# Patient Record
Sex: Male | Born: 1950 | Race: White | Hispanic: No | Marital: Married | Smoking: Never smoker
Health system: Southern US, Community
[De-identification: ages and names within clinical notes are randomized; demographics above are authoritative.]

## PROBLEM LIST (undated history)

## (undated) DIAGNOSIS — Z9621 Cochlear implant status: Secondary | ICD-10-CM

## (undated) DIAGNOSIS — R42 Dizziness and giddiness: Secondary | ICD-10-CM

## (undated) DIAGNOSIS — I4510 Unspecified right bundle-branch block: Secondary | ICD-10-CM

## (undated) DIAGNOSIS — K8020 Calculus of gallbladder without cholecystitis without obstruction: Secondary | ICD-10-CM

## (undated) DIAGNOSIS — K449 Diaphragmatic hernia without obstruction or gangrene: Secondary | ICD-10-CM

## (undated) DIAGNOSIS — M5136 Other intervertebral disc degeneration, lumbar region: Secondary | ICD-10-CM

## (undated) DIAGNOSIS — R7303 Prediabetes: Secondary | ICD-10-CM

## (undated) DIAGNOSIS — I2510 Atherosclerotic heart disease of native coronary artery without angina pectoris: Secondary | ICD-10-CM

## (undated) DIAGNOSIS — M51369 Other intervertebral disc degeneration, lumbar region without mention of lumbar back pain or lower extremity pain: Secondary | ICD-10-CM

## (undated) DIAGNOSIS — K222 Esophageal obstruction: Secondary | ICD-10-CM

## (undated) DIAGNOSIS — K3184 Gastroparesis: Secondary | ICD-10-CM

## (undated) DIAGNOSIS — Z860100 Personal history of colon polyps, unspecified: Secondary | ICD-10-CM

## (undated) DIAGNOSIS — E559 Vitamin D deficiency, unspecified: Secondary | ICD-10-CM

## (undated) DIAGNOSIS — K5790 Diverticulosis of intestine, part unspecified, without perforation or abscess without bleeding: Secondary | ICD-10-CM

## (undated) DIAGNOSIS — K589 Irritable bowel syndrome without diarrhea: Secondary | ICD-10-CM

## (undated) DIAGNOSIS — R011 Cardiac murmur, unspecified: Secondary | ICD-10-CM

## (undated) DIAGNOSIS — E785 Hyperlipidemia, unspecified: Secondary | ICD-10-CM

## (undated) DIAGNOSIS — I10 Essential (primary) hypertension: Secondary | ICD-10-CM

## (undated) DIAGNOSIS — I700 Atherosclerosis of aorta: Secondary | ICD-10-CM

## (undated) DIAGNOSIS — K219 Gastro-esophageal reflux disease without esophagitis: Secondary | ICD-10-CM

## (undated) DIAGNOSIS — K4020 Bilateral inguinal hernia, without obstruction or gangrene, not specified as recurrent: Secondary | ICD-10-CM

## (undated) DIAGNOSIS — D689 Coagulation defect, unspecified: Secondary | ICD-10-CM

## (undated) DIAGNOSIS — N529 Male erectile dysfunction, unspecified: Secondary | ICD-10-CM

## (undated) DIAGNOSIS — M1990 Unspecified osteoarthritis, unspecified site: Secondary | ICD-10-CM

## (undated) HISTORY — DX: Hyperlipidemia, unspecified: E78.5

## (undated) HISTORY — DX: Unspecified osteoarthritis, unspecified site: M19.90

## (undated) HISTORY — PX: UPPER GI ENDOSCOPY: SHX6162

## (undated) HISTORY — DX: Male erectile dysfunction, unspecified: N52.9

## (undated) HISTORY — DX: Other intervertebral disc degeneration, lumbar region without mention of lumbar back pain or lower extremity pain: M51.369

## (undated) HISTORY — DX: Irritable bowel syndrome, unspecified: K58.9

## (undated) HISTORY — DX: Personal history of colonic polyps: Z86.010

## (undated) HISTORY — DX: Diaphragmatic hernia without obstruction or gangrene: K44.9

## (undated) HISTORY — PX: STAPEDECTOMY: SHX2435

## (undated) HISTORY — DX: Other intervertebral disc degeneration, lumbar region: M51.36

## (undated) HISTORY — PX: WISDOM TOOTH EXTRACTION: SHX21

## (undated) HISTORY — DX: Personal history of colon polyps, unspecified: Z86.0100

## (undated) HISTORY — DX: Unspecified right bundle-branch block: I45.10

## (undated) HISTORY — DX: Dizziness and giddiness: R42

## (undated) HISTORY — DX: Essential (primary) hypertension: I10

## (undated) HISTORY — DX: Atherosclerotic heart disease of native coronary artery without angina pectoris: I25.10

## (undated) HISTORY — DX: Atherosclerosis of aorta: I70.0

## (undated) HISTORY — DX: Gastroparesis: K31.84

## (undated) HISTORY — DX: Gastro-esophageal reflux disease without esophagitis: K21.9

## (undated) HISTORY — DX: Esophageal obstruction: K22.2

## (undated) HISTORY — PX: ARTHROSCOPIC REPAIR ACL: SUR80

## (undated) HISTORY — PX: COLONOSCOPY: SHX174

## (undated) HISTORY — PX: COCHLEAR IMPLANT: SHX184

## (undated) HISTORY — PX: HERNIA REPAIR: SHX51

## (undated) HISTORY — DX: Coagulation defect, unspecified: D68.9

## (undated) HISTORY — DX: Vitamin D deficiency, unspecified: E55.9

## (undated) HISTORY — DX: Diverticulosis of intestine, part unspecified, without perforation or abscess without bleeding: K57.90

## (undated) HISTORY — DX: Bilateral inguinal hernia, without obstruction or gangrene, not specified as recurrent: K40.20

## (undated) HISTORY — PX: SKIN LESION EXCISION: SHX2412

## (undated) HISTORY — DX: Cardiac murmur, unspecified: R01.1

## (undated) HISTORY — DX: Calculus of gallbladder without cholecystitis without obstruction: K80.20

---
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
## 1973-07-04 HISTORY — PX: CYST REMOVAL HAND: SHX6279

## 2003-07-05 HISTORY — PX: CARDIAC CATHETERIZATION: SHX172

## 2008-03-11 ENCOUNTER — Encounter: Payer: Self-pay | Admitting: Internal Medicine

## 2012-07-04 HISTORY — PX: TRIGGER FINGER RELEASE: SHX641

## 2013-02-20 ENCOUNTER — Encounter: Payer: Self-pay | Admitting: Cardiovascular Disease

## 2013-02-20 ENCOUNTER — Ambulatory Visit (INDEPENDENT_AMBULATORY_CARE_PROVIDER_SITE_OTHER): Payer: PRIVATE HEALTH INSURANCE | Admitting: Cardiovascular Disease

## 2013-02-20 VITALS — BP 160/90 | HR 80 | Ht 74.0 in | Wt 231.0 lb

## 2013-02-20 DIAGNOSIS — E1169 Type 2 diabetes mellitus with other specified complication: Secondary | ICD-10-CM | POA: Insufficient documentation

## 2013-02-20 DIAGNOSIS — I1A0 Resistant hypertension: Secondary | ICD-10-CM | POA: Insufficient documentation

## 2013-02-20 DIAGNOSIS — R9431 Abnormal electrocardiogram [ECG] [EKG]: Secondary | ICD-10-CM | POA: Insufficient documentation

## 2013-02-20 DIAGNOSIS — I10 Essential (primary) hypertension: Secondary | ICD-10-CM

## 2013-02-20 DIAGNOSIS — E785 Hyperlipidemia, unspecified: Secondary | ICD-10-CM

## 2013-02-20 NOTE — Patient Instructions (Signed)
Dr Berry will follow-up with you as needed. 

## 2013-02-20 NOTE — Progress Notes (Signed)
02/20/2013 Nathaniel Gill   [birth year]  [id number]  Primary Physician No primary provider on file. Primary Cardiologist: Runell Gess MD Roseanne Reno   HPI:  Nathaniel Gill is a 62 year old mild to moderately overweight married Caucasian male father of one son and is accompanied by his wife today. His mother, Nathaniel Gill, was also a patient of mine. He is self referred for evaluation of a recent EKG performed in [place] in the setting of an vertigo and apparently was abnormal. His crit was Dr. Raymondo Band is remarkable for hypertension and hyperlipidemia as well as family history with a mother and brother who have had heart disease. He has never had a heart attack or stroke denies chest pain or shortness of breath. He does not smoke or drink. He had an episode of vertigo a month ago  and had an EKG performed during that evaluation that apparently was abnormal.   Current Outpatient Prescriptions  Medication Sig Dispense Refill  . aspirin 81 MG tablet Take 81 mg by mouth daily.      Marland Kitchen atorvastatin (LIPITOR) 20 MG tablet Take 20 mg by mouth daily.      Marland Kitchen diltiazem (CARDIZEM) 120 MG tablet Take 120 mg by mouth 4 (four) times daily.      . hydrochlorothiazide (HYDRODIURIL) 25 MG tablet Take 25 mg by mouth daily.      Marland Kitchen omeprazole (PRILOSEC) 20 MG capsule Take 20 mg by mouth daily.       No current facility-administered medications for this visit.    No Known Allergies  History   Social History  . Marital Status: Married    Spouse Name: N/A    Number of Children: N/A  . Years of Education: N/A   Occupational History  . Not on file.   Social History Main Topics  . Smoking status: Never Smoker   . Smokeless tobacco: Not on file  . Alcohol Use: No  . Drug Use: Not on file  . Sexual Activity: Not on file   Other Topics Concern  . Not on file   Social History Narrative  . No narrative on file     Review of Systems: General: negative for chills, fever, night sweats or  weight changes.  Cardiovascular: negative for chest pain, dyspnea on exertion, edema, orthopnea, palpitations, paroxysmal nocturnal dyspnea or shortness of breath Dermatological: negative for rash Respiratory: negative for cough or wheezing Urologic: negative for hematuria Abdominal: negative for nausea, vomiting, diarrhea, bright red blood per rectum, melena, or hematemesis Neurologic: negative for visual changes, syncope, or dizziness All other systems reviewed and are otherwise negative except as noted above.    Blood pressure 160/90, pulse 80, height 6\' 2"  (1.88 m), weight 231 lb (104.781 kg).  General appearance: alert and no distress Neck: no adenopathy, no carotid bruit, no JVD, supple, symmetrical, trachea midline and thyroid not enlarged, symmetric, no tenderness/mass/nodules Lungs: clear to auscultation bilaterally Heart: regular rate and rhythm, S1, S2 normal, no murmur, click, rub or gallop Abdomen: soft, non-tender; bowel sounds normal; no masses,  no organomegaly Extremities: extremities normal, atraumatic, no cyanosis or edema Pulses: 2+ and symmetric  EKG normal sinus rhythm at 64 with a right bundle branch block  ASSESSMENT AND PLAN:   Abnormal EKG Patient had a recent episode of vertigo was evaluated by his primary care physician. An EKG was performed and apparently was "abnormal" though the patient is unaware of the abnormality and we do not have an EKG to review. I'm going  to repeat a 12-lead EKG to review today and make further recommendations based on that result.otherwise, I will see him back when necessary      Runell Gess MD [facility], [facility] 02/20/2013 10:56 AM

## 2013-02-20 NOTE — Assessment & Plan Note (Signed)
Patient had a recent episode of vertigo was evaluated by his primary care physician. An EKG was performed and apparently was "abnormal" though the patient is unaware of the abnormality and we do not have an EKG to review. I'm going to repeat a 12-lead EKG to review today and make further recommendations based on that result.otherwise, I will see him back when necessary

## 2017-04-13 ENCOUNTER — Ambulatory Visit: Payer: Self-pay | Admitting: Physician Assistant

## 2017-04-13 ENCOUNTER — Ambulatory Visit (INDEPENDENT_AMBULATORY_CARE_PROVIDER_SITE_OTHER): Payer: 59 | Admitting: Adult Health

## 2017-04-13 ENCOUNTER — Encounter: Payer: Self-pay | Admitting: Adult Health

## 2017-04-13 VITALS — BP 150/100 | HR 60 | Temp 97.3°F | Resp 20 | Ht 73.5 in | Wt 233.8 lb

## 2017-04-13 DIAGNOSIS — Z130 Encounter for screening for diseases of the blood and blood-forming organs and certain disorders involving the immune mechanism: Secondary | ICD-10-CM

## 2017-04-13 DIAGNOSIS — Z1159 Encounter for screening for other viral diseases: Secondary | ICD-10-CM

## 2017-04-13 DIAGNOSIS — Z1329 Encounter for screening for other suspected endocrine disorder: Secondary | ICD-10-CM

## 2017-04-13 DIAGNOSIS — R9431 Abnormal electrocardiogram [ECG] [EKG]: Secondary | ICD-10-CM

## 2017-04-13 DIAGNOSIS — I10 Essential (primary) hypertension: Secondary | ICD-10-CM

## 2017-04-13 DIAGNOSIS — Z125 Encounter for screening for malignant neoplasm of prostate: Secondary | ICD-10-CM

## 2017-04-13 DIAGNOSIS — E559 Vitamin D deficiency, unspecified: Secondary | ICD-10-CM

## 2017-04-13 DIAGNOSIS — Z0000 Encounter for general adult medical examination without abnormal findings: Secondary | ICD-10-CM

## 2017-04-13 DIAGNOSIS — M5136 Other intervertebral disc degeneration, lumbar region: Secondary | ICD-10-CM | POA: Insufficient documentation

## 2017-04-13 DIAGNOSIS — E785 Hyperlipidemia, unspecified: Secondary | ICD-10-CM

## 2017-04-13 DIAGNOSIS — Z136 Encounter for screening for cardiovascular disorders: Secondary | ICD-10-CM | POA: Diagnosis not present

## 2017-04-13 DIAGNOSIS — Z1211 Encounter for screening for malignant neoplasm of colon: Secondary | ICD-10-CM

## 2017-04-13 DIAGNOSIS — Z1389 Encounter for screening for other disorder: Secondary | ICD-10-CM

## 2017-04-13 DIAGNOSIS — Z114 Encounter for screening for human immunodeficiency virus [HIV]: Secondary | ICD-10-CM

## 2017-04-13 DIAGNOSIS — M898X9 Other specified disorders of bone, unspecified site: Secondary | ICD-10-CM

## 2017-04-13 DIAGNOSIS — I4510 Unspecified right bundle-branch block: Secondary | ICD-10-CM

## 2017-04-13 DIAGNOSIS — K219 Gastro-esophageal reflux disease without esophagitis: Secondary | ICD-10-CM

## 2017-04-13 DIAGNOSIS — Z131 Encounter for screening for diabetes mellitus: Secondary | ICD-10-CM

## 2017-04-13 MED ORDER — RANITIDINE HCL 150 MG PO TABS: 150.0000 mg | ORAL_TABLET | Freq: Two times a day (BID) | ORAL | 1 refills | Status: DC

## 2017-04-13 MED ORDER — LISINOPRIL-HYDROCHLOROTHIAZIDE 20-12.5 MG PO TABS: 1.0000 | ORAL_TABLET | Freq: Every day | ORAL | 3 refills | Status: DC

## 2017-04-13 MED ORDER — FENOFIBRATE 145 MG PO TABS: 145.0000 mg | ORAL_TABLET | Freq: Every day | ORAL | 3 refills | Status: DC

## 2017-04-13 MED ORDER — AMLODIPINE BESYLATE 10 MG PO TABS: 10.0000 mg | ORAL_TABLET | Freq: Every day | ORAL | 3 refills | Status: DC

## 2017-04-13 MED ORDER — ATENOLOL 50 MG PO TABS: 50.0000 mg | ORAL_TABLET | Freq: Every day | ORAL | 11 refills | Status: DC

## 2017-04-13 NOTE — Patient Instructions (Addendum)
If you do not have one already, please pick up a BP monitor - recommend Omron 3rd series off of [employer] as a reliable/affordable option.  We will try to taper you off of some BP medications, starting with atenolol. I am writing you for a smaller dose than you are currently taking- 50 mg. We will stay on this dose for a few weeks, then go down to half a tab for a few weeks, then stop and see how your BP does.    GETTING OFF OF PPI's    Nexium/protonix/prilosec/Omeprazole/Dexilant/Aciphex are called PPI's, they are great at healing your stomach but should only be taken for a short period of time.     Recent studies have shown that taken for a long time they  can increase the risk of osteoporosis (weakening of your bones), pneumonia, low magnesium, restless legs, Cdiff (infection that causes diarrhea), DEMENTIA and most recently kidney damage / disease / insufficiency.     Due to this information we want to try to stop the PPI but if you try to stop it abruptly this can cause rebound acid and worsening symptoms.   So this is how we want you to get off the PPI:  - Start taking the nexium/protonix/prilosec/PPI  every other day with  zantac (ranitidine) 2 x a day for 2-4 weeks - some people stay on this dosage and can not taper off further. Our main goal is to limit the dosage and amount you are taking so if you need to stay on this dose.   - then decrease the PPI to every 3 days while taking the zantac (ranitidine) 300mg  twice a day the other  days for 2-4  Weeks  - then you can try the zantac (ranitidine) 300mg  once at night or up to 2 x day as needed.  - you can continue on this once at night or stop all together  - Avoid alcohol, spicy foods, NSAIDS (aleve, ibuprofen) at this time. See foods below.   +++++++++++++++++++++++++++++++++++++++++++  Food Choices for Gastroesophageal Reflux Disease  When you have gastroesophageal reflux disease (GERD), the foods you eat and your eating  habits are very important. Choosing the right foods can help ease the discomfort of GERD. WHAT GENERAL GUIDELINES DO I NEED TO FOLLOW?  Choose fruits, vegetables, whole grains, low-fat dairy products, and low-fat meat, fish, and poultry.  Limit fats such as oils, salad dressings, butter, nuts, and avocado.  Keep a food diary to identify foods that cause symptoms.  Avoid foods that cause reflux. These may be different for different people.  Eat frequent small meals instead of three large meals each day.  Eat your meals slowly, in a relaxed setting.  Limit fried foods.  Cook foods using methods other than frying.  Avoid drinking alcohol.  Avoid drinking large amounts of liquids with your meals.  Avoid bending over or lying down until 2-3 hours after eating.   WHAT FOODS ARE NOT RECOMMENDED? The following are some foods and drinks that may worsen your symptoms:  Vegetables Tomatoes. Tomato juice. Tomato and spaghetti sauce. Chili peppers. Onion and garlic. Horseradish. Fruits Oranges, grapefruit, and lemon (fruit and juice). Meats High-fat meats, fish, and poultry. This includes hot dogs, ribs, ham, sausage, salami, and bacon. Dairy Whole milk and chocolate milk. Sour cream. Cream. Butter. Ice cream. Cream cheese.  Beverages Coffee and tea, with or without caffeine. Carbonated beverages or energy drinks. Condiments Hot sauce. Barbecue sauce.  Sweets/Desserts Chocolate and cocoa. Donuts. Peppermint and  spearmint. Fats and Oils High-fat foods, including [place] fries and potato chips. Other Vinegar. Strong spices, such as black pepper, white pepper, red pepper, cayenne, curry powder, cloves, ginger, and chili powder. Nexium/protonix/prilosec are called PPI's, they are great at healing your stomach but should only be taken for a short period of time.

## 2017-04-13 NOTE — Progress Notes (Signed)
Complete Physical  Assessment and Plan:  Nathaniel Gill was seen today for annual exam and establishment of care.  Diagnoses and all orders for this visit:  Essential hypertension -     Magnesium -     lisinopril-hydrochlorothiazide (PRINZIDE,ZESTORETIC) 20-12.5 MG tablet; Take 1 tablet by mouth daily. -     amLODipine (NORVASC) 10 MG tablet; Take 1 tablet (10 mg total) by mouth daily. -     atenolol (TENORMIN) 50 MG tablet; Take 1 tablet (50 mg total) by mouth daily. -     Will restart medications with a taper plan for atenolol- follow up in 4 weeks.   Hyperlipidemia, unspecified hyperlipidemia type -     Hepatic function panel -     TSH -     Lipid panel -     fenofibrate (TRICOR) 145 MG tablet; Take 1 tablet (145 mg total) by mouth daily.  Abnormal EKG -     EKG 12-Lead  Screening for cardiovascular condition -     CBC with Differential/Platelet -     BASIC METABOLIC PANEL WITH GFR -     EKG 12-Lead -     [place], RETROPERITNL ABD,  LTD  Screening for colon cancer -     POC Hemoccult Bld/Stl (3-Cd Home Screen); Future  Screening for deficiency anemia       -     CBC  Screening for diabetes mellitus -     BASIC METABOLIC PANEL WITH GFR -     Hemoglobin A1c -     Insulin, fasting  Screening for hematuria or proteinuria -     Microalbumin / creatinine urine ratio -     Urinalysis, Complete (81001)  Screening for thyroid disorder -     TSH  Vitamin D deficiency/ osteoporosis prophylaxis -     VITAMIN D 25 Hydroxy (Vit-D Deficiency, Fractures)  Screening PSA (prostate specific antigen) -     PSA  Degenerative lumbar disc      -       L4-5 reported by patient; asymptomatic except for occasional "flare"       -       monitor, discuss ortho referral to establish care for future need for injections  Bony growth of foot      -      Chronic, appears to be a considerable slowly growing bony deformity of the MCP joint of 1st digit, causes discomfort with certain shoes, apparently  familial component as mother had similar slow growing deformity      -      Requests ortho referral to discuss options   Need for hepatitis C screening test (age based recommendation) -     Hepatitis C antibody  Screening for HIV (human immunodeficiency virus) (age based recommendation) -     HIV antibody  Gastroesophageal reflux disease, esophagitis presence not specified -     ranitidine (ZANTAC) 150 MG tablet; Take 1 tablet (150 mg total) by mouth 2 (two) times daily.  RBBB (right bundle branch block)      -      Confirmed on EKG today, also noted on review of EKG from 3 years prior- tapering off atenolol.    Discussed med's effects and SE's. Screening labs and tests as requested with regular follow-up as recommended. Over 45 minutes of exam, counseling, chart review, coordination of care and critical decision making was performed for establishment of new patient with complete physical.   Future Appointments Date Time Provider Department  Center  05/23/2017 9:30 AM Liane Comber, NP [facility] None  04/16/2018 9:00 AM Liane Comber, NP [facility] None     HPI BP (!) 150/100   Pulse 60   Temp (!) 97.3 F (36.3 C)   Resp 20   Ht 6' 1.5" (1.867 m)   Wt 233 lb 12.8 oz (106.1 kg)   SpO2 95%   BMI 30.43 kg/m   This is a new patient to this practice, presenting for a complete physical to become established for primary care management. 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 He exercises nearly every day (cardio/weights), watches his diet, and would like to work towards getting off of some medications. Never smoker, drinks very rarely, 3 cups of coffee a day, 4-6x16.9 fl oz bottles of water daily. Performs regular self-testicular exams without concerns.   He reports a long ongoing history of hypertension; he had a cardiac cath in 2005 after an episode of chest pain  which was reportedly demonstrated a 95% patency. He also has a documented history of an "abnormal EKG" for which he was seen by Dr. Gwenlyn Found in 2014 with a diagnosis of right bundle branch block. The patient's BP medications were since adjusted by physicians in [place]- reports he currently takes amlodipine 10 mg, atenolol 100 mg, lisinopril/hctz 20-12.5 mg daily, though he has been out of amlodipine and combo med for the past few weeks and presents today with a BP of 150/100.   He has had intermittent GI issues; reports mild ongoing GERD well controlled by lifestyle modification and daily prilosec. He has also reports a distant history hemorrhagic gastritis following the consumption of some types of sea food. He reports he avoids triggers and has not had an issue in recent years. He also reports intermittent dysphagia (1-2 x per month) that began with one particularly bad episode after which he had an endoscopy. 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 He reports current episodes are less frequent than they have been, and currently managed by avoiding triggers. Denies wanting to see GI regarding this at this time. Encouraged to contact us for referral should frequency increase.    Current Medications:  Current Outpatient Prescriptions on File Prior to Visit  Medication Sig Dispense Refill  . aspirin 81 MG tablet Take 81 mg by mouth daily.    Marland Kitchen diltiazem (CARDIZEM) 120 MG tablet Take 120 mg by mouth 4 (four) times daily.    Marland Kitchen omeprazole (PRILOSEC) 20 MG capsule Take 20 mg by mouth daily.     No current facility-administered medications on file prior to visit.    Allergies:  No Known Allergies Health Maintenance:   There is no immunization history on file for this patient.  Tetanus: 2014 with laceration Pneumovax: Denies Prevnar 13: Denies Flu vaccine: Denies Zostavax: Denies PPD: negative 2011-2016  DEXA: n/a Colonoscopy: 2011 normal EGD: 2003? - After several episodes of  dysphagia/choking Eye Exam: 2017 [name], [place] Wears contact on right eye for distance, will establish locally Dentist: 2017 [name], [place] - will establish locally  Patient Care Team: Unk Pinto, MD as PCP - General (Internal Medicine)  Medical History:  has Essential hypertension; Hyperlipidemia; and Abnormal EKG on his problem list. Surgical History:  He  has a past surgical history that includes Cardiac catheterization (2005). Family History:  His family history includes Heart  disease in his mother; Hyperlipidemia in his brother, brother, and brother; Hypertension in his brother. Social History:   reports that he has never smoked. He has never used smokeless tobacco. He reports that he does not drink alcohol. His drug history is not on file. Review of Systems:  Review of Systems  Constitutional: Negative.  Negative for weight loss.  HENT: Negative for congestion, hearing loss and sore throat.   Eyes: Negative for blurred vision.  Respiratory: Negative for cough, shortness of breath and wheezing.   Cardiovascular: Negative for chest pain and palpitations.  Gastrointestinal: Negative for abdominal pain, blood in stool (Occasionally with straining), constipation, melena and nausea.  Genitourinary: Negative.   Musculoskeletal: Negative for myalgias.  Skin: Negative.   Neurological: Negative for dizziness, sensory change and headaches.  Endo/Heme/Allergies: Negative.  Negative for environmental allergies. Does not bruise/bleed easily.  Psychiatric/Behavioral: Negative.     Physical Exam: Estimated body mass index is 30.43 kg/m as calculated from the following:   Height as of this encounter: 6' 1.5" (1.867 m).   Weight as of this encounter: 233 lb 12.8 oz (106.1 kg). BP (!) 150/100   Pulse 60   Temp (!) 97.3 F (36.3 C)   Resp 20   Ht 6' 1.5" (1.867 m)   Wt 233 lb 12.8 oz (106.1 kg)   SpO2 95%   BMI 30.43 kg/m    General Appearance: Well nourished, in no  apparent distress.  Eyes: PERRLA, EOMs, conjunctiva no swelling or erythema, normal fundi and vessels.  Sinuses: No Frontal/maxillary tenderness  ENT/Mouth: Ext aud canals clear, normal light reflex with TMs without erythema, bulging. Good dentition. No erythema, swelling, or exudate on post pharynx. Tonsils not swollen or erythematous. Hearing normal.  Neck: Supple, thyroid normal. No bruits  Respiratory: Respiratory effort normal, BS equal bilaterally without rales, rhonchi, wheezing or stridor.  Cardio: RR, bradycardic, without murmurs, rubs or gallops. Brisk peripheral pulses without edema.  Chest: symmetric, with normal excursions and percussion.  Abdomen: Soft, nontender, no guarding, rebound, hernias, masses, or organomegaly.  Lymphatics: Non tender without lymphadenopathy.  Genitourinary: Patient requests to defer Musculoskeletal: Full ROM all peripheral extremities,5/5 strength, and normal gait. Bony growth/deformity noted to superior aspect of R MCP joint of 1st digit- ROM intact, without signs of acute inflammation.  Skin: Warm, dry without rashes, lesions, ecchymosis. Neuro: Cranial nerves intact, reflexes equal bilaterally. Normal muscle tone, no cerebellar symptoms. Sensation intact.  Psych: Awake and oriented X 3, normal affect, Insight and Judgment appropriate.   EKG: sinus bradycardia, RBBB  AORTA SCAN: WNL  Gorden Harms Brielle Moro 8:35 AM [facility]

## 2017-04-14 LAB — BASIC METABOLIC PANEL WITH GFR
BUN: 15 mg/dL (ref 7–25)
CALCIUM: 10.4 mg/dL — AB (ref 8.6–10.3)
CO2: 27 mmol/L (ref 20–32)
Chloride: 105 mmol/L (ref 98–110)
Creat: 1.17 mg/dL (ref 0.70–1.25)
GFR, EST AFRICAN AMERICAN: 76 mL/min/{1.73_m2} (ref >=60)
GFR, EST NON AFRICAN AMERICAN: 65 mL/min/{1.73_m2} (ref >=60)
Glucose, Bld: 110 mg/dL — ABNORMAL HIGH (ref 65–99)
Potassium: 4.4 mmol/L (ref 3.5–5.3)
Sodium: 140 mmol/L (ref 135–146)

## 2017-04-14 LAB — CBC WITH DIFFERENTIAL/PLATELET
BASOS PCT: 1.8 %
Basophils Absolute: 99 cells/uL (ref 0–200)
Eosinophils Absolute: 352 cells/uL (ref 15–500)
Eosinophils Relative: 6.4 %
HCT: 43.4 % (ref 38.5–50.0)
Hemoglobin: 14.8 g/dL (ref 13.2–17.1)
LYMPHS ABS: 1694 {cells}/uL (ref 850–3900)
MCH: 29.9 pg (ref 27.0–33.0)
MCHC: 34.1 g/dL (ref 32.0–36.0)
MCV: 87.7 fL (ref 80.0–100.0)
MPV: 10 fL (ref 7.5–12.5)
Monocytes Relative: 12.4 %
NEUTROS PCT: 48.6 %
Neutro Abs: 2673 cells/uL (ref 1500–7800)
Platelets: 328 10*3/uL (ref 140–400)
RBC: 4.95 10*6/uL (ref 4.20–5.80)
RDW: 12.9 % (ref 11.0–15.0)
Total Lymphocyte: 30.8 %
WBC: 5.5 10*3/uL (ref 3.8–10.8)
WBCMIX: 682 {cells}/uL (ref 200–950)

## 2017-04-14 LAB — HEPATIC FUNCTION PANEL
AG Ratio: 2 (calc) (ref 1.0–2.5)
ALT: 30 U/L (ref 9–46)
AST: 26 U/L (ref 10–35)
Albumin: 4.7 g/dL (ref 3.6–5.1)
Alkaline phosphatase (APISO): 28 U/L — ABNORMAL LOW (ref 40–115)
BILIRUBIN DIRECT: 0.1 mg/dL (ref 0.0–0.2)
BILIRUBIN INDIRECT: 0.4 mg/dL (ref 0.2–1.2)
BILIRUBIN TOTAL: 0.5 mg/dL (ref 0.2–1.2)
Globulin: 2.3 g/dL (calc) (ref 1.9–3.7)
Total Protein: 7 g/dL (ref 6.1–8.1)

## 2017-04-14 LAB — URINALYSIS, COMPLETE
BILIRUBIN URINE: NEGATIVE
Bacteria, UA: NONE SEEN /HPF
Glucose, UA: NEGATIVE
HGB URINE DIPSTICK: NEGATIVE
HYALINE CAST: NONE SEEN /LPF
Ketones, ur: NEGATIVE
Leukocytes, UA: NEGATIVE
NITRITE: NEGATIVE
PROTEIN: NEGATIVE
Specific Gravity, Urine: 1.019 (ref 1.001–1.03)
Squamous Epithelial / LPF: NONE SEEN /HPF (ref <=5)
pH: 7 (ref 5.0–8.0)

## 2017-04-14 LAB — HEPATITIS C ANTIBODY
Hepatitis C Ab: NONREACTIVE
SIGNAL TO CUT-OFF: 0.16 (ref <1.00)

## 2017-04-14 LAB — PSA: PSA: 1.3 ng/mL (ref <=4.0)

## 2017-04-14 LAB — MAGNESIUM: Magnesium: 2.1 mg/dL (ref 1.5–2.5)

## 2017-04-14 LAB — LIPID PANEL
CHOL/HDL RATIO: 5.6 (calc) — AB (ref <5.0)
Cholesterol: 263 mg/dL — ABNORMAL HIGH (ref <200)
HDL: 47 mg/dL (ref >40)
LDL CHOLESTEROL (CALC): 196 mg/dL — AB
NON-HDL CHOLESTEROL (CALC): 216 mg/dL — AB (ref <130)
Triglycerides: 90 mg/dL (ref <150)

## 2017-04-14 LAB — HEMOGLOBIN A1C
Hgb A1c MFr Bld: 6.1 % of total Hgb — ABNORMAL HIGH (ref <5.7)
Mean Plasma Glucose: 128 (calc)
eAG (mmol/L): 7.1 (calc)

## 2017-04-14 LAB — HIV ANTIBODY (ROUTINE TESTING W REFLEX): HIV 1&2 Ab, 4th Generation: NONREACTIVE

## 2017-04-14 LAB — MICROALBUMIN / CREATININE URINE RATIO
CREATININE, URINE: 133 mg/dL (ref 20–320)
MICROALB UR: 1.6 mg/dL
Microalb Creat Ratio: 12 mcg/mg creat (ref <30)

## 2017-04-14 LAB — INSULIN, RANDOM: INSULIN: 9.2 u[IU]/mL (ref 2.0–19.6)

## 2017-04-14 LAB — VITAMIN D 25 HYDROXY (VIT D DEFICIENCY, FRACTURES): VIT D 25 HYDROXY: 19 ng/mL — AB (ref 30–100)

## 2017-04-14 LAB — TSH: TSH: 2.36 mIU/L (ref 0.40–4.50)

## 2017-04-18 NOTE — Progress Notes (Signed)
Pt aware of lab results & voiced understanding of those results.

## 2017-04-20 ENCOUNTER — Encounter: Payer: Self-pay | Admitting: Adult Health

## 2017-04-21 ENCOUNTER — Other Ambulatory Visit: Payer: Self-pay | Admitting: Adult Health

## 2017-04-21 ENCOUNTER — Encounter: Payer: Self-pay | Admitting: Adult Health

## 2017-04-21 DIAGNOSIS — K219 Gastro-esophageal reflux disease without esophagitis: Secondary | ICD-10-CM

## 2017-04-21 DIAGNOSIS — E785 Hyperlipidemia, unspecified: Secondary | ICD-10-CM

## 2017-04-21 DIAGNOSIS — I10 Essential (primary) hypertension: Secondary | ICD-10-CM

## 2017-05-15 MED ORDER — ATENOLOL 50 MG PO TABS: 50.0000 mg | ORAL_TABLET | Freq: Every day | ORAL | 3 refills | Status: DC

## 2017-05-15 MED ORDER — RANITIDINE HCL 150 MG PO TABS: 150.0000 mg | ORAL_TABLET | Freq: Two times a day (BID) | ORAL | 3 refills | Status: DC

## 2017-05-15 MED ORDER — LISINOPRIL-HYDROCHLOROTHIAZIDE 20-12.5 MG PO TABS: 1.0000 | ORAL_TABLET | Freq: Every day | ORAL | 3 refills | Status: DC

## 2017-05-15 MED ORDER — AMLODIPINE BESYLATE 10 MG PO TABS: 10.0000 mg | ORAL_TABLET | Freq: Every day | ORAL | 3 refills | Status: DC

## 2017-05-15 MED ORDER — FENOFIBRATE 145 MG PO TABS: 145.0000 mg | ORAL_TABLET | Freq: Every day | ORAL | 3 refills | Status: DC

## 2017-05-22 DIAGNOSIS — K219 Gastro-esophageal reflux disease without esophagitis: Secondary | ICD-10-CM | POA: Insufficient documentation

## 2017-05-22 DIAGNOSIS — R7303 Prediabetes: Secondary | ICD-10-CM

## 2017-05-22 DIAGNOSIS — E119 Type 2 diabetes mellitus without complications: Secondary | ICD-10-CM | POA: Insufficient documentation

## 2017-05-22 DIAGNOSIS — Z79899 Other long term (current) drug therapy: Secondary | ICD-10-CM | POA: Insufficient documentation

## 2017-05-22 DIAGNOSIS — Z7689 Persons encountering health services in other specified circumstances: Secondary | ICD-10-CM | POA: Insufficient documentation

## 2017-05-22 DIAGNOSIS — E559 Vitamin D deficiency, unspecified: Secondary | ICD-10-CM | POA: Insufficient documentation

## 2017-05-22 NOTE — Progress Notes (Signed)
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   Reminder to go to the ER if any CP, SOB, nausea, dizziness, severe HA, changes vision/speech, left arm numbness and tingling and jaw pain.  Cholesterol Currently on fenofibrate; consider switch to statin as triglycerides are now well controlled but LDL remains elevated Continue low cholesterol diet and exercise.  Check lipid panel.   Prediabetes Long discussion about pathology and disease progression.  Continue diet and exercise.  Perform daily foot/skin check, notify office of any concerning changes.  Defer A1C recheck until next visit.   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  Starting to cause discomfort with shoes - will refer to ortho per patient request.   Continue diet and meds as discussed. Further disposition pending results of labs. Discussed med's effects and SE's.   Over 30 minutes of exam, counseling, chart review, and critical decision making was performed.   Future Appointments  Date Time Provider [place]  04/16/2018  9:00 AM Liane Comber, NP [facility] None    ----------------------------------------------------------------------------------------------------------------------  HPI 66 y.o. male  presents for 1 month follow up on hypertension, cholesterol, prediabetes, weight and vitamin D deficiency. He has started on a vitamin D supplement. He has been taking mlodipine 10 mg, atenolol 50 mg, lisinopril - hctz 20-12.5 daily for BP;  he checks sporadically and presents a log - consistently running over 140/80, some values as high as 172/96 ; denies missed doses - He reports he has been on a myriad of BP medications over the last several years without significant improvement. He denies HA/vision changes, CP, dizziness, dyspnea -   He is also concerned about bony enlargement of his L foot 1st digit MTP joint - ongoing for several years, is starting to cause discomfort with wearing shoes. Denies other pain or interference with activities. He reports his mother had similar bony growths of bilateral feet that caused discomfort later in life though not aware of diagnosis or particular treatment. Request ortho referral to discuss today.   BMI is Body mass index is 30.32 kg/m., he has been working on diet and exercise. Wt Readings from Last 3 Encounters:  05/23/17 233 lb (105.7 kg)  04/13/17 233 lb 12.8 oz (106.1 kg)  02/20/13 231 lb (104.8 kg)   His blood pressure has not been controlled at home, today their BP is BP: (!) 140/96  He does workout. He denies chest pain, shortness of breath, dizziness.   He is on cholesterol medication and denies myalgias. His cholesterol is not at goal. The cholesterol last visit was:   Lab Results  Component Value Date   CHOL 263 (H) 04/13/2017   HDL 47 04/13/2017   TRIG 90 04/13/2017   CHOLHDL 5.6 (H) 04/13/2017    He has been working on diet and exercise for prediabetes, and denies nausea, paresthesia of the feet, polydipsia, polyuria, visual disturbances and vomiting. Last A1C in the office was:  Lab Results  Component Value Date   HGBA1C 6.1 (H) 04/13/2017   Patient was not on a Vitamin D supplement and was very low  at the last visit:   Lab Results  Component Value Date   VD25OH 19 (L) 04/13/2017        Current Medications:  Current Outpatient Medications on File Prior to Visit  Medication Sig  . amLODipine (NORVASC) 10 MG tablet Take 1 tablet (10 mg total) daily by mouth.  Marland Kitchen  aspirin 81 MG tablet Take 81 mg by mouth daily.  . fenofibrate (TRICOR) 145 MG tablet Take 1 tablet (145 mg total) daily by mouth.  Marland Kitchen lisinopril-hydrochlorothiazide (PRINZIDE,ZESTORETIC) 20-12.5 MG tablet Take 1 tablet daily by mouth.  . ranitidine (ZANTAC) 150 MG tablet Take 1 tablet (150 mg total) 2 (two) times daily by mouth.  Marland Kitchen atenolol (TENORMIN) 50 MG tablet Take 1 tablet (50 mg total) daily by mouth. (Patient not taking: Reported on 05/23/2017)  . omeprazole (PRILOSEC) 20 MG capsule Take 20 mg by mouth daily.   No current facility-administered medications on file prior to visit.      Allergies: No Known Allergies   Medical History:  Past Medical History:  Diagnosis Date  . Hyperlipidemia   . Hypertension   . Vertigo    Family history- Reviewed and unchanged Social history- Reviewed and unchanged   Review of Systems:  Review of Systems  Constitutional: Negative for malaise/fatigue and weight loss.  HENT: Negative for hearing loss and tinnitus.   Eyes: Negative for blurred vision and double vision.  Respiratory: Negative for cough, shortness of breath and wheezing.   Cardiovascular: Negative for chest pain, palpitations, orthopnea, claudication and leg swelling.  Gastrointestinal: Negative for abdominal pain, blood in stool, constipation, diarrhea, heartburn, melena, nausea and vomiting.  Genitourinary: Negative.   Musculoskeletal: Negative for joint pain and myalgias.  Skin: Negative for rash.  Neurological: Negative for dizziness, tingling, sensory change, weakness and headaches.  Endo/Heme/Allergies: Negative for polydipsia.  Psychiatric/Behavioral: Negative.   All other systems reviewed and are negative.   Physical Exam: BP (!) 140/96   Pulse (!) 58   Temp (!) 97.3 F (36.3 C)   Ht 6' 1.5" (1.867 m)   Wt 233 lb (105.7 kg)   SpO2 97%   BMI 30.32 kg/m  Wt Readings from Last 3 Encounters:  05/23/17 233 lb (105.7 kg)  04/13/17 233 lb 12.8 oz (106.1 kg)   02/20/13 231 lb (104.8 kg)   General Appearance: Well nourished, in no apparent distress. Eyes: PERRLA, EOMs, conjunctiva no swelling or erythema Sinuses: No Frontal/maxillary tenderness ENT/Mouth: Ext aud canals clear, TMs without erythema, bulging. No erythema, swelling, or exudate on post pharynx.  Tonsils not swollen or erythematous. Hearing normal.  Neck: Supple, thyroid normal.  Respiratory: Respiratory effort normal, BS equal bilaterally without rales, rhonchi, wheezing or stridor.  Cardio: RRR with no MRGs. Brisk peripheral pulses without edema.  Abdomen: Soft, + BS.  Non tender, no guarding, rebound, hernias, masses. Lymphatics: Non tender without lymphadenopathy.  Musculoskeletal: Full ROM, 5/5 strength, Normal gait. L MTP joint of 1st digit on foot demonstrates bony enlargement compared to R, ROM normal, no palpable crepitus; not injected or inflamed.  Skin: Warm, dry without rashes, lesions, ecchymosis.  Neuro: Cranial nerves intact. No cerebellar symptoms.  Psych: Awake and oriented X 3, normal affect, Insight and Judgment appropriate.    Izora Ribas, NP 12:16 PM [facility]

## 2017-05-23 ENCOUNTER — Encounter: Payer: Self-pay | Admitting: Adult Health

## 2017-05-23 ENCOUNTER — Ambulatory Visit: Payer: 59 | Admitting: Adult Health

## 2017-05-23 VITALS — BP 140/96 | HR 58 | Temp 97.3°F | Ht 73.5 in | Wt 233.0 lb

## 2017-05-23 DIAGNOSIS — R7303 Prediabetes: Secondary | ICD-10-CM

## 2017-05-23 DIAGNOSIS — Z79899 Other long term (current) drug therapy: Secondary | ICD-10-CM

## 2017-05-23 DIAGNOSIS — M21962 Unspecified acquired deformity of left lower leg: Secondary | ICD-10-CM | POA: Diagnosis not present

## 2017-05-23 DIAGNOSIS — I10 Essential (primary) hypertension: Secondary | ICD-10-CM

## 2017-05-23 DIAGNOSIS — N529 Male erectile dysfunction, unspecified: Secondary | ICD-10-CM

## 2017-05-23 DIAGNOSIS — K219 Gastro-esophageal reflux disease without esophagitis: Secondary | ICD-10-CM

## 2017-05-23 DIAGNOSIS — E559 Vitamin D deficiency, unspecified: Secondary | ICD-10-CM

## 2017-05-23 DIAGNOSIS — E782 Mixed hyperlipidemia: Secondary | ICD-10-CM

## 2017-05-23 DIAGNOSIS — E1169 Type 2 diabetes mellitus with other specified complication: Secondary | ICD-10-CM | POA: Insufficient documentation

## 2017-05-23 MED ORDER — SILDENAFIL CITRATE 100 MG PO TABS: ORAL_TABLET | ORAL | 1 refills | Status: DC

## 2017-05-23 MED ORDER — MINOXIDIL 10 MG PO TABS: ORAL_TABLET | ORAL | 3 refills | Status: DC

## 2017-05-23 NOTE — Patient Instructions (Signed)
Take BP daily and keep a record.    Hypertension Hypertension, commonly called high blood pressure, is when the force of blood pumping through the arteries is too strong. The arteries are the blood vessels that carry blood from the heart throughout the body. Hypertension forces the heart to work harder to pump blood and may cause arteries to become narrow or stiff. Having untreated or uncontrolled hypertension can cause heart attacks, strokes, kidney disease, and other problems. A blood pressure reading consists of a higher number over a lower number. Ideally, your blood pressure should be below 120/80. The first ("top") number is called the systolic pressure. It is a measure of the pressure in your arteries as your heart beats. The second ("bottom") number is called the diastolic pressure. It is a measure of the pressure in your arteries as the heart relaxes. What are the causes? The cause of this condition is not known. What increases the risk? Some risk factors for high blood pressure are under your control. Others are not. Factors you can change  Smoking.  Having type 2 diabetes mellitus, high cholesterol, or both.  Not getting enough exercise or physical activity.  Being overweight.  Having too much fat, sugar, calories, or salt (sodium) in your diet.  Drinking too much alcohol. Factors that are difficult or impossible to change  Having chronic kidney disease.  Having a family history of high blood pressure.  Age. Risk increases with age.  Race. You may be at higher risk if you are African-American.  Gender. Men are at higher risk than women before age 39. After age 81, women are at higher risk than men.  Having obstructive sleep apnea.  Stress. What are the signs or symptoms? Extremely high blood pressure (hypertensive crisis) may cause:  Headache.  Anxiety.  Shortness of breath.  Nosebleed.  Nausea and vomiting.  Severe chest pain.  Jerky movements you  cannot control (seizures).  How is this diagnosed? This condition is diagnosed by measuring your blood pressure while you are seated, with your arm resting on a surface. The cuff of the blood pressure monitor will be placed directly against the skin of your upper arm at the level of your heart. It should be measured at least twice using the same arm. Certain conditions can cause a difference in blood pressure between your right and left arms. Certain factors can cause blood pressure readings to be lower or higher than normal (elevated) for a short period of time:  When your blood pressure is higher when you are in a health care provider's office than when you are at home, this is called white coat hypertension. Most people with this condition do not need medicines.  When your blood pressure is higher at home than when you are in a health care provider's office, this is called masked hypertension. Most people with this condition may need medicines to control blood pressure.  If you have a high blood pressure reading during one visit or you have normal blood pressure with other risk factors:  You may be asked to return on a different day to have your blood pressure checked again.  You may be asked to monitor your blood pressure at home for 1 week or longer.  If you are diagnosed with hypertension, you may have other blood or imaging tests to help your health care provider understand your overall risk for other conditions. How is this treated? This condition is treated by making healthy lifestyle changes, such as eating  healthy foods, exercising more, and reducing your alcohol intake. Your health care provider may prescribe medicine if lifestyle changes are not enough to get your blood pressure under control, and if:  Your systolic blood pressure is above 130.  Your diastolic blood pressure is above 80.  Your personal target blood pressure may vary depending on your medical conditions, your age,  and other factors. Follow these instructions at home: Eating and drinking  Eat a diet that is high in fiber and potassium, and low in sodium, added sugar, and fat. An example eating plan is called the DASH (Dietary Approaches to Stop Hypertension) diet. To eat this way: ? Eat plenty of fresh fruits and vegetables. Try to fill half of your plate at each meal with fruits and vegetables. ? Eat whole grains, such as whole wheat pasta, brown rice, or whole grain bread. Fill about one quarter of your plate with whole grains. ? Eat or drink low-fat dairy products, such as skim milk or low-fat yogurt. ? Avoid fatty cuts of meat, processed or cured meats, and poultry with skin. Fill about one quarter of your plate with lean proteins, such as fish, chicken without skin, beans, eggs, and tofu. ? Avoid premade and processed foods. These tend to be higher in sodium, added sugar, and fat.  Reduce your daily sodium intake. Most people with hypertension should eat less than 1,500 mg of sodium a day.  Limit alcohol intake to no more than 1 drink a day for nonpregnant women and 2 drinks a day for men. One drink equals 12 oz of beer, 5 oz of wine, or 1 oz of hard liquor. Lifestyle  Work with your health care provider to maintain a healthy body weight or to lose weight. Ask what an ideal weight is for you.  Get at least 30 minutes of exercise that causes your heart to beat faster (aerobic exercise) most days of the week. Activities may include walking, swimming, or biking.  Include exercise to strengthen your muscles (resistance exercise), such as pilates or lifting weights, as part of your weekly exercise routine. Try to do these types of exercises for 30 minutes at least 3 days a week.  Do not use any products that contain nicotine or tobacco, such as cigarettes and e-cigarettes. If you need help quitting, ask your health care provider.  Monitor your blood pressure at home as told by your health care  provider.  Keep all follow-up visits as told by your health care provider. This is important. Medicines  Take over-the-counter and prescription medicines only as told by your health care provider. Follow directions carefully. Blood pressure medicines must be taken as prescribed.  Do not skip doses of blood pressure medicine. Doing this puts you at risk for problems and can make the medicine less effective.  Ask your health care provider about side effects or reactions to medicines that you should watch for. Contact a health care provider if:  You think you are having a reaction to a medicine you are taking.  You have headaches that keep coming back (recurring).  You feel dizzy.  You have swelling in your ankles.  You have trouble with your vision. Get help right away if:  You develop a severe headache or confusion.  You have unusual weakness or numbness.  You feel faint.  You have severe pain in your chest or abdomen.  You vomit repeatedly.  You have trouble breathing. Summary  Hypertension is when the force of blood pumping through  your arteries is too strong. If this condition is not controlled, it may put you at risk for serious complications.  Your personal target blood pressure may vary depending on your medical conditions, your age, and other factors. For most people, a normal blood pressure is less than 120/80.  Hypertension is treated with lifestyle changes, medicines, or a combination of both. Lifestyle changes include weight loss, eating a healthy, low-sodium diet, exercising more, and limiting alcohol. This information is not intended to replace advice given to you by your health care provider. Make sure you discuss any questions you have with your health care provider. Document Released: 06/20/2005 Document Revised: 05/18/2016 Document Reviewed: 05/18/2016 Elsevier Interactive Patient Education  [employer].

## 2017-05-24 ENCOUNTER — Other Ambulatory Visit: Payer: Self-pay | Admitting: Adult Health

## 2017-05-24 DIAGNOSIS — E782 Mixed hyperlipidemia: Secondary | ICD-10-CM

## 2017-05-24 MED ORDER — ATORVASTATIN CALCIUM 40 MG PO TABS: 40.0000 mg | ORAL_TABLET | Freq: Every day | ORAL | 3 refills | Status: DC

## 2017-05-30 LAB — LIPID PANEL
CHOL/HDL RATIO: 5.7 (calc) — AB (ref <5.0)
Cholesterol: 246 mg/dL — ABNORMAL HIGH (ref <200)
HDL: 43 mg/dL (ref >40)
LDL CHOLESTEROL (CALC): 175 mg/dL — AB
NON-HDL CHOLESTEROL (CALC): 203 mg/dL — AB (ref <130)
Triglycerides: 138 mg/dL (ref <150)

## 2017-05-30 LAB — TEST AUTHORIZATION

## 2017-05-30 LAB — BASIC METABOLIC PANEL WITH GFR
BUN: 15 mg/dL (ref 7–25)
CO2: 31 mmol/L (ref 20–32)
CREATININE: 1.14 mg/dL (ref 0.70–1.25)
Calcium: 10.7 mg/dL — ABNORMAL HIGH (ref 8.6–10.3)
Chloride: 104 mmol/L (ref 98–110)
GFR, EST AFRICAN AMERICAN: 78 mL/min/{1.73_m2} (ref >=60)
GFR, EST NON AFRICAN AMERICAN: 68 mL/min/{1.73_m2} (ref >=60)
GLUCOSE: 119 mg/dL — AB (ref 65–99)
Potassium: 4.7 mmol/L (ref 3.5–5.3)
SODIUM: 141 mmol/L (ref 135–146)

## 2017-05-30 LAB — ALDOSTERONE + RENIN ACTIVITY W/ RATIO
ALDO / PRA RATIO: 7 ratio (ref 0.9–28.9)
Aldosterone, Serum: 3 ng/dL
Renin Activity: 0.43 ng/mL/h (ref 0.25–5.82)

## 2017-05-30 LAB — PHOSPHORUS: PHOSPHORUS: 3.3 mg/dL (ref 2.5–4.5)

## 2017-05-31 ENCOUNTER — Encounter: Payer: Self-pay | Admitting: Adult Health

## 2017-06-02 ENCOUNTER — Ambulatory Visit (HOSPITAL_COMMUNITY): Admission: RE | Admit: 2017-06-02 | Payer: 59 | Source: Ambulatory Visit

## 2017-06-22 ENCOUNTER — Ambulatory Visit: Payer: 59 | Admitting: Cardiology

## 2017-06-22 ENCOUNTER — Encounter: Payer: Self-pay | Admitting: Cardiology

## 2017-06-22 VITALS — BP 144/76 | HR 70 | Ht 73.5 in | Wt 239.0 lb

## 2017-06-22 DIAGNOSIS — N5201 Erectile dysfunction due to arterial insufficiency: Secondary | ICD-10-CM | POA: Diagnosis not present

## 2017-06-22 DIAGNOSIS — E782 Mixed hyperlipidemia: Secondary | ICD-10-CM

## 2017-06-22 DIAGNOSIS — R011 Cardiac murmur, unspecified: Secondary | ICD-10-CM | POA: Diagnosis not present

## 2017-06-22 DIAGNOSIS — I10 Essential (primary) hypertension: Secondary | ICD-10-CM | POA: Diagnosis not present

## 2017-06-22 DIAGNOSIS — I4510 Unspecified right bundle-branch block: Secondary | ICD-10-CM | POA: Diagnosis not present

## 2017-06-22 MED ORDER — CARVEDILOL 12.5 MG PO TABS: 12.5000 mg | ORAL_TABLET | Freq: Two times a day (BID) | ORAL | 6 refills | Status: DC

## 2017-06-22 NOTE — Patient Instructions (Signed)
MEDICATION INSTRUCTIONS  --- STOP TAKING ATENOLOL  ----START CARVEDILOL 12.5 MG  TWICE A DAY    TESTING  SCHEDULE AT [facility] [address]  Your physician has requested that you have an echocardiogram. Echocardiography is a painless test that uses sound waves to create images of your heart. It provides your doctor with information about the size and shape of your heart and how well your heart's chambers and valves are working. This procedure takes approximately one hour. There are no restrictions for this procedure.   AND SCHEDULE AT [place] Your physician has requested that you have a renal artery duplex. During this test, an ultrasound is used to evaluate blood flow to the kidneys. Allow one hour for this exam. Do not eat after midnight the day before and avoid carbonated beverages. Take your medications as you usually do.  Your physician recommends that you schedule a follow-up appointment in: 2 [place].

## 2017-06-22 NOTE — Progress Notes (Signed)
PCP: Unk Pinto, MD; Liane Comber, NP  Clinic Note: Chief Complaint  Patient presents with  . New Patient (Initial Visit)  . Hypertension  . Dizziness  . Bradycardia    HPI: Nathaniel Gill is a 66 y.o. male who is being seen today for the evaluation of baseline cardiovascular risk with a history of resistant hypertension, bradycardia and dizziness.  He also has a significant family history of CAD.  He is being seen at the request of Liane Comber, NP. He himself has never had a heart attack or stroke, and he has never had any significant chest tightness or pressure.  He does not smoke or drink.  Nathaniel Gill was seen on November 20 by Liane Comber, NP and noted persistent difficult to control hypertension.  She just recently added minoxidil.  The talked about DASH diet and sodium restriction.  Recent Hospitalizations: None  Studies Personally Reviewed - (if available, images/films reviewed: From Epic Chart or Care Everywhere)  renal artery Dopplers have been ordered, but not yet done  Interval History: Nathaniel Gill presents here today somewhat frustrated because his blood pressures have been very much out of control for quite a long time.  He says his blood pressure today is probably the best he has had in a while.  He brings with him a blood pressure log showing his pressures ranging from the 948N-462V systolic to the 03J/00X diastolic.  This is despite being on multiple medications.  He does not notice that his blood pressure change much with the addition of minoxidil. Despite having significant hypertension issues, he is never had any headache or blurred vision, dizziness.  None of the standard symptoms to suggest that he is symptomatic of his hypertension.  No heart failure symptoms of PND, orthopnea or edema. He does not carry a history of hyponatremia or hypokalemia, but unfortunately I do not have labs on him since he was started on an ACE inhibitor, HCTZ or  beta-blocker (all of which will throw those results off).  Overall, from a cardiac standpoint he is doing fine he has no major symptoms.  He occasionally has vertigo but nothing like he had a couple years ago.  He is relatively active, but does not do excessive exercise.  He tries to get some regular exercise and watch his diet.  Apparently was told that he snores, and there is been some consideration of sleep apnea.  Unfortunately as part of his initial workup, he did not have the sleep apnea scale filled out.  We will have him do this on follow-up visit.  Cardiac review of symptoms: No chest pain or shortness of breath with rest or exertion.  No palpitations, lightheadedness, dizziness, weakness or syncope/near syncope. No TIA/amaurosis fugax symptoms. No melena, hematochezia, hematuria, or epstaxis. No claudication.  ROS: A comprehensive was performed. Review of Systems  Constitutional: Negative for malaise/fatigue.  HENT: Negative for congestion and nosebleeds.   Respiratory: Negative for cough, shortness of breath and wheezing.   Cardiovascular: Positive for leg swelling (Mild end of day).  Gastrointestinal: Negative for blood in stool, heartburn and melena.  Genitourinary: Negative for dysuria and hematuria.  Musculoskeletal: Positive for joint pain (Mild arthritis pains). Negative for myalgias.  Neurological: Positive for dizziness (Occasional vertigo). Negative for weakness.  Psychiatric/Behavioral: Negative for depression and memory loss. The patient is not nervous/anxious and does not have insomnia.   All other systems reviewed and are negative.  I have reviewed and (if needed) personally updated the patient's problem  list, medications, allergies, past medical and surgical history, social and family history.   Past Medical History:  Diagnosis Date  . Hyperlipidemia   . Hypertension   . Vertigo     Past Surgical History:  Procedure Laterality Date  . ABDOMINAL SURGERY   1956   Related to bleeding episode.   Marland Kitchen CARDIAC CATHETERIZATION  2005   no intervention per patient  . CYST REMOVAL HAND Left 1975   [place]  . SKIN LESION EXCISION    . STAPEDECTOMY Bilateral   . TRIGGER FINGER RELEASE Right 2014   Thumb    Current Meds  Medication Sig  . amLODipine (NORVASC) 10 MG tablet Take 1 tablet (10 mg total) daily by mouth.  Marland Kitchen aspirin 81 MG tablet Take 81 mg by mouth daily.  Marland Kitchen atenolol (TENORMIN) 50 MG tablet Take 1 tablet (50 mg total) daily by mouth.  Marland Kitchen atorvastatin (LIPITOR) 40 MG tablet Take 1 tablet (40 mg total) by mouth daily.  Marland Kitchen lisinopril-hydrochlorothiazide (PRINZIDE,ZESTORETIC) 20-12.5 MG tablet Take 1 tablet daily by mouth.  . minoxidil (LONITEN) 10 MG tablet Take 1/2 tab daily at night  . omeprazole (PRILOSEC) 20 MG capsule Take 20 mg by mouth daily.  . ranitidine (ZANTAC) 150 MG capsule Take 150 mg by mouth 2 (two) times daily.  . sildenafil (VIAGRA) 100 MG tablet Take 1/4-1/2 tab as needed.    No Known Allergies  Social History   Tobacco Use  . Smoking status: Never Smoker  . Smokeless tobacco: Never Used  Substance Use Topics  . Alcohol use: Yes    Comment: Social occasional - once a month  . Drug use: No    Comment: In 89s - cocaine, marijuana   Social History   Social History Narrative  . Not on file    family history includes Arrhythmia in his brother; COPD in his mother; Heart attack in his brother; Heart disease in his mother; Hyperlipidemia in his brother and brother; Hypertension in his brother and brother.  Wt Readings from Last 3 Encounters:  06/22/17 239 lb (108.4 kg)  05/23/17 233 lb (105.7 kg)  04/13/17 233 lb 12.8 oz (106.1 kg)    PHYSICAL EXAM BP (!) 144/76   Pulse 70   Ht 6' 1.5" (1.867 m)   Wt 239 lb (108.4 kg)   BMI 31.10 kg/m  Physical Exam  Constitutional: He is oriented to person, place, and time. He appears well-developed and well-nourished. No distress.  Well-groomed.  Healthy-appearing  Neck:  No hepatojugular reflux and no JVD present. Carotid bruit is not present.  Cardiovascular: Normal rate, regular rhythm and intact distal pulses.  No extrasystoles are present. PMI is not displaced. Exam reveals no gallop and no friction rub.  Murmur (Soft SM and RUSB and midline.) heard. Cannot exclude soft S4  Pulmonary/Chest: Effort normal and breath sounds normal. No respiratory distress.  Abdominal: Soft. Bowel sounds are normal. He exhibits no distension. There is no tenderness. There is no rebound.  Musculoskeletal: Normal range of motion. He exhibits no edema or deformity.  Neurological: He is alert and oriented to person, place, and time. No cranial nerve deficit.  Skin: Skin is warm and dry. No rash noted. No erythema.  Psychiatric: He has a normal mood and affect. His behavior is normal. Thought content normal.  Nursing note and vitals reviewed.   Adult ECG Report Not checked   Other studies Reviewed: Additional studies/ records that were reviewed today include:  Recent Labs:    Lab Results  Component Value Date   CREATININE 1.14 05/23/2017   BUN 15 05/23/2017   NA 141 05/23/2017   K 4.7 05/23/2017   CL 104 05/23/2017   CO2 31 05/23/2017   Lab Results  Component Value Date   CHOL 246 (H) 05/23/2017   HDL 43 05/23/2017   TRIG 138 05/23/2017   CHOLHDL 5.7 (H) 05/23/2017    ASSESSMENT / PLAN: Problem List Items Addressed This Visit    Erectile dysfunction (Chronic)    This is often times a harbinger of CAD.  We will need to monitor and consider early evaluation if he has symptoms. For now he is simply not doing anything about this because taking half dose of Viagra caused significant palpitations.  May simply be too high dose, versus if he has better beta-blocker effect.      Hyperlipidemia (Chronic)    He is on atorvastatin, but is having some memory issues.  His last check cholesterol was not very favorable.  A little bit concerned however with atorvastatin the  memory issues.  I have asked the hold it for 6 weeks in order to see if there is any change in his memory issues.  If not would be okay to go back on it.  If he is intolerant of atorvastatin, would need to then consider Either using Crestor as an option versus Livalo.      Relevant Medications   carvedilol (COREG) 12.5 MG tablet   RBBB (right bundle branch block) (Chronic)    Does not appear to be an new finding.  Will evaluate for any structural abnormalities on echocardiogram      Relevant Medications   carvedilol (COREG) 12.5 MG tablet   Other Relevant Orders   ECHOCARDIOGRAM COMPLETE   Resistant hypertension - Primary (Chronic)    At this stage the game is hard to tell what the true etiology is.  Is a little bit late to order studies to reveal hyperaldosteronism, however he does not seem to have low potassium or sodium levels.  I did recommend that he goes forward with the Dopplers as an evaluation for secondary causes of hypertension. For now, he is on a fairly reasonable regimen.  However I would like to switch him from atenolol to carvedilol which will allow me to titrate further without reducing his heart rate.  (Next option would be switching to Bystolic). He is on max dose of amlodipine and minoxidil.  There is still room to increase his lisinopril-HCTZ up as well --which would be the next step after titrating up carvedilol.  Based on his long-standing hypertension, I do want to check a 2D echocardiogram (there may be a soft S4) simply to evaluate to see if there is any evidence of hypertensive hypertrophic cardiomyopathy.      Relevant Medications   carvedilol (COREG) 12.5 MG tablet   Other Relevant Orders   VAS US RENAL ARTERY DUPLEX   Systolic murmur    Soft systolic murmur along with possible S4.  Likely benign, however need to assess for any gross abnormalities. Plan: 2D echo.      Relevant Orders   ECHOCARDIOGRAM COMPLETE        Current medicines are reviewed at  length with the patient today. (+/- concerns) n/a The following changes have been made:Convert from atenolol to carvedilol  Patient Instructions  MEDICATION INSTRUCTIONS  --- STOP TAKING ATENOLOL  ----START CARVEDILOL 12.5 MG  TWICE A DAY    TESTING  SCHEDULE AT [facility] has requested that you have an echocardiogram. Echocardiography is a painless test that uses sound waves to create images of your heart. It provides your doctor with information about the size and shape of your heart and how well your heart's chambers and valves are working. This procedure takes approximately one hour. There are no restrictions for this procedure.   AND SCHEDULE AT [facility] Your physician has requested that you have a renal artery duplex. During this test, an ultrasound is used to evaluate blood flow to the kidneys. Allow one hour for this exam. Do not eat after midnight the day before and avoid carbonated beverages. Take your medications as you usually do.  Your physician recommends that you schedule a follow-up appointment in: 2 [place].     Studies Ordered:   Orders Placed This Encounter  Procedures  . ECHOCARDIOGRAM COMPLETE      [name], M.D., M.S. Interventional Cardiologist   Pager # [phone] Phone # [phone] [address]. [place], [place] [address]   Thank you for choosing Heartcare at [facility]!!

## 2017-06-24 ENCOUNTER — Encounter: Payer: Self-pay | Admitting: Cardiology

## 2017-06-24 NOTE — Assessment & Plan Note (Signed)
Does not appear to be an new finding.  Will evaluate for any structural abnormalities on echocardiogram

## 2017-06-24 NOTE — Assessment & Plan Note (Signed)
Soft systolic murmur along with possible S4.  Likely benign, however need to assess for any gross abnormalities. Plan: 2D echo.

## 2017-06-24 NOTE — Assessment & Plan Note (Signed)
At this stage the game is hard to tell what the true etiology is.  Is a little bit late to order studies to reveal hyperaldosteronism, however he does not seem to have low potassium or sodium levels.  I did recommend that he goes forward with the Dopplers as an evaluation for secondary causes of hypertension. For now, he is on a fairly reasonable regimen.  However I would like to switch him from atenolol to carvedilol which will allow me to titrate further without reducing his heart rate.  (Next option would be switching to Bystolic). He is on max dose of amlodipine and minoxidil.  There is still room to increase his lisinopril-HCTZ up as well --which would be the next step after titrating up carvedilol.  Based on his long-standing hypertension, I do want to check a 2D echocardiogram (there may be a soft S4) simply to evaluate to see if there is any evidence of hypertensive hypertrophic cardiomyopathy.

## 2017-06-24 NOTE — Assessment & Plan Note (Signed)
He is on atorvastatin, but is having some memory issues.  His last check cholesterol was not very favorable.  A little bit concerned however with atorvastatin the memory issues.  I have asked the hold it for 6 weeks in order to see if there is any change in his memory issues.  If not would be okay to go back on it.  If he is intolerant of atorvastatin, would need to then consider Either using Crestor as an option versus Livalo.

## 2017-06-24 NOTE — Assessment & Plan Note (Signed)
This is often times a harbinger of CAD.  We will need to monitor and consider early evaluation if he has symptoms. For now he is simply not doing anything about this because taking half dose of Viagra caused significant palpitations.  May simply be too high dose, versus if he has better beta-blocker effect.

## 2017-06-29 NOTE — Addendum Note (Signed)
Addended by: Leanord Asal T on: 06/29/2017 04:23 PM   Modules accepted: Orders

## 2017-07-04 HISTORY — PX: TRANSTHORACIC ECHOCARDIOGRAM: SHX275

## 2017-07-07 ENCOUNTER — Other Ambulatory Visit: Payer: Self-pay

## 2017-07-07 ENCOUNTER — Ambulatory Visit (HOSPITAL_BASED_OUTPATIENT_CLINIC_OR_DEPARTMENT_OTHER): Payer: 59

## 2017-07-07 ENCOUNTER — Ambulatory Visit (HOSPITAL_COMMUNITY)
Admission: RE | Admit: 2017-07-07 | Discharge: 2017-07-07 | Disposition: A | Discharge status: Home / Self Care | Payer: 59 | Source: Ambulatory Visit | Attending: Cardiovascular Disease | Admitting: Cardiovascular Disease

## 2017-07-07 DIAGNOSIS — I4510 Unspecified right bundle-branch block: Secondary | ICD-10-CM

## 2017-07-07 DIAGNOSIS — I10 Essential (primary) hypertension: Secondary | ICD-10-CM | POA: Diagnosis not present

## 2017-07-07 DIAGNOSIS — R011 Cardiac murmur, unspecified: Secondary | ICD-10-CM

## 2017-07-16 ENCOUNTER — Encounter: Payer: Self-pay | Admitting: Adult Health

## 2017-07-17 ENCOUNTER — Other Ambulatory Visit: Payer: Self-pay | Admitting: Adult Health

## 2017-07-17 DIAGNOSIS — I10 Essential (primary) hypertension: Secondary | ICD-10-CM

## 2017-07-17 MED ORDER — CARVEDILOL 25 MG PO TABS: 25.0000 mg | ORAL_TABLET | Freq: Two times a day (BID) | ORAL | 2 refills | Status: DC

## 2017-08-24 ENCOUNTER — Ambulatory Visit: Payer: 59 | Admitting: Cardiology

## 2017-08-24 ENCOUNTER — Encounter: Payer: Self-pay | Admitting: Cardiology

## 2017-08-24 VITALS — BP 119/75 | HR 65 | Ht 74.0 in | Wt 234.0 lb

## 2017-08-24 DIAGNOSIS — I4510 Unspecified right bundle-branch block: Secondary | ICD-10-CM

## 2017-08-24 DIAGNOSIS — I10 Essential (primary) hypertension: Secondary | ICD-10-CM

## 2017-08-24 DIAGNOSIS — E782 Mixed hyperlipidemia: Secondary | ICD-10-CM | POA: Diagnosis not present

## 2017-08-24 DIAGNOSIS — R011 Cardiac murmur, unspecified: Secondary | ICD-10-CM

## 2017-08-24 NOTE — Patient Instructions (Signed)
No change with current medications    Your physician wants you to follow-up in 6 month with DR HARDING.You will receive a reminder letter in the mail two months in advance. If you don't receive a letter, please call our office to schedule the follow-up appointment.     If you need a refill on your cardiac medications before your next appointment, please call your pharmacy.

## 2017-08-24 NOTE — Progress Notes (Signed)
PCP: Nathaniel Pinto, MD; Nathaniel Pinto, MD  Clinic Note: Chief Complaint  Patient presents with  . Follow-up    NO COMPLAINTS  . Hypertension    HPI: Nathaniel Gill is a 67 y.o. male who is being seen today for 2 month evaluation of baseline cardiovascular risk with a history of resistant hypertension, bradycardia and dizziness.  He also has a significant family history of CAD.  He  seen at the request of Nathaniel Pinto, MD after a visit with Nathaniel Comber, NP.  Seen in August 2014 by Nathaniel Gill for what appeared to be an episode of vertigo, and Gill to have an abnormal EKG.  He never followed back up again.  He himself has never had a heart attack or stroke, and he has never had any significant chest tightness or pressure.  He does not smoke or drink.  Nathaniel Gill was initially seen was seen on Dec 20 at the request of Nathaniel Pinto, MD -- noted persistent difficult to control hypertension.  He brought a blood pressure log showing his pressures ranging from the 824M-353I systolic to the 14E/31V diastolic.  This is despite being on multiple medications.  He does not notice that his blood pressure change much with the addition of minoxidil. Renal Duplex & Echo ordered. No active cardiac complaints. --We switch from atenolol to carvedilol.  Recent Hospitalizations: None  Studies Personally Reviewed - (if available, images/films reviewed: From Epic Chart or Care Everywhere)  Renal artery Dopplers July 07, 2017: Normal bilateral kidneys, normal bilateral renal artery flow.  Normal celiac and superior mesenteric artery flow.  2D Echo-July 07, 2017: Normal LV size and function.  EF 55-60%.  No wall motion normalities.  Only GR 1 DD noted.  No signs of hypertensive heart disease on this report (although the data suggests focal basal septal thickening)  Interval History: Nathaniel Gill presents here today doing well with no complaints.  He was happy to hear the news about  his studies and has made a conscious effort to loose wgt - increased workout routine -- ~30 min day in AM & TM in evenings (20-30 min)  -- HR up to 119.  No SSx of chest pain/pressure or dyspnea.  He has lost more weight than is shown - was down to 225 Lb @ home- but gained some back (was 230 Lb @ home today.  Cardiovascular ROS: positive for - mild end of day swelling.  Noted shooting pain on L side 1 night while sleeing - better with changing position - shooitng pain negative for - chest pain, dyspnea on exertion, irregular heartbeat, murmur, orthopnea, palpitations, paroxysmal nocturnal dyspnea, rapid heart rate, shortness of breath or TIA/ amaurosis fugax or syncope/ near syncope.  No claudication  No melena, hematochezia, hematuria, or epstaxis.   ROS:  Review of Systems  Constitutional: Positive for weight loss. Negative for malaise/fatigue (intentional).  HENT: Negative for congestion and nosebleeds.   Respiratory: Negative for cough, shortness of breath and wheezing.   Cardiovascular: Positive for leg swelling (Mild end of day).  Gastrointestinal: Negative for blood in stool, heartburn and melena.  Genitourinary: Negative for dysuria and hematuria.  Musculoskeletal: Positive for joint pain (Mild arthritis pains - with increased exercise  - ankles & knees). Negative for myalgias.  Neurological: Positive for dizziness (rare faint spells of vertigo). Negative for weakness.  Psychiatric/Behavioral: Negative for depression. The patient does not have insomnia.   All other systems reviewed and are negative.  I have reviewed and (  if needed) personally updated the patient's problem list, medications, allergies, past medical and surgical history, social and family history.   Past Medical History:  Diagnosis Date  . Hyperlipidemia   . Hypertension    No sign of renal artery stenosis  . Vertigo     Past Surgical History:  Procedure Laterality Date  . ABDOMINAL SURGERY  1956   Related to  bleeding episode.   Marland Kitchen CARDIAC CATHETERIZATION  2005   no intervention per patient  . CYST REMOVAL HAND Left 1975   [place]  . SKIN LESION EXCISION    . STAPEDECTOMY Bilateral   . TRANSTHORACIC ECHOCARDIOGRAM  07/2017   Normal LV size and function.  EF 55-60%.  No RWMA.  Basal septal hypertrophy, but no sign of significant hypertensive heart disease.  Only GR 1 DD.  Marland Kitchen TRIGGER FINGER RELEASE Right 2014   Thumb    Current Meds  Medication Sig  . amLODipine (NORVASC) 10 MG tablet Take 1 tablet (10 mg total) daily by mouth.  Marland Kitchen aspirin 81 MG tablet Take 81 mg by mouth daily.  Marland Kitchen atorvastatin (LIPITOR) 40 MG tablet Take 1 tablet (40 mg total) by mouth daily.  . carvedilol (COREG) 25 MG tablet Take 1 tablet (25 mg total) by mouth 2 (two) times daily.  Marland Kitchen lisinopril-hydrochlorothiazide (PRINZIDE,ZESTORETIC) 20-12.5 MG tablet Take 1 tablet daily by mouth.  . minoxidil (LONITEN) 10 MG tablet Take 1/2 tab daily at night  . omeprazole (PRILOSEC) 20 MG capsule Take 20 mg by mouth daily.  . ranitidine (ZANTAC) 150 MG capsule Take 150 mg by mouth 2 (two) times daily.  . sildenafil (VIAGRA) 100 MG tablet Take 1/4-1/2 tab as needed.    No Known Allergies  Social History   Tobacco Use  . Smoking status: Never Smoker  . Smokeless tobacco: Never Used  Substance Use Topics  . Alcohol use: Yes    Comment: Social occasional - once a month  . Drug use: No    Comment: In 72s - cocaine, marijuana   Social History   Social History Narrative  . Not on file    family history includes Arrhythmia in his brother; COPD in his mother; Heart attack in his brother; Heart disease in his mother; Hyperlipidemia in his brother and brother; Hypertension in his brother and brother.  Wt Readings from Last 3 Encounters:  08/24/17 234 lb (106.1 kg)  06/22/17 239 lb (108.4 kg)  05/23/17 233 lb (105.7 kg)  - increased workout routine -- ~30 min day in AM & TM in evenings (20-30 min)   PHYSICAL EXAM BP 119/75    Pulse 65   Ht 6\' 2"  (1.88 m)   Wt 234 lb (106.1 kg)   SpO2 97%   BMI 30.04 kg/m  Physical Exam  Constitutional: He is oriented to person, place, and time. He appears well-developed and well-nourished. No distress.  Well-groomed.  Healthy-appearing  HENT:  Head: Normocephalic and atraumatic.  Neck: No hepatojugular reflux and no JVD present. Carotid bruit is not present.  Cardiovascular: Normal rate, regular rhythm and intact distal pulses.  No extrasystoles are present. PMI is not displaced. Exam reveals no gallop and no friction rub.  Murmur (Soft SM and RUSB and midline.) heard. Cannot exclude soft S4  Pulmonary/Chest: Effort normal and breath sounds normal. No respiratory distress. He has no wheezes. He has no rales.  Abdominal: Soft. Bowel sounds are normal. He exhibits no distension. There is no tenderness.  Musculoskeletal: Normal range of motion. He exhibits  no edema.  Neurological: He is alert and oriented to person, place, and time. No cranial nerve deficit.  Skin: Skin is warm and dry.  Psychiatric: He has a normal mood and affect. His behavior is normal. Thought content normal.  Nursing note and vitals reviewed.   Adult ECG Report Not checked   Other studies Reviewed: Additional studies/ records that were reviewed today include:  Recent Labs:    Lab Results  Component Value Date   CREATININE 1.14 05/23/2017   BUN 15 05/23/2017   NA 141 05/23/2017   K 4.7 05/23/2017   CL 104 05/23/2017   CO2 31 05/23/2017   Lab Results  Component Value Date   CHOL 246 (H) 05/23/2017   HDL 43 05/23/2017   TRIG 138 05/23/2017   CHOLHDL 5.7 (H) 05/23/2017  LDL 175 !!  ASSESSMENT / PLAN: Problem List Items Addressed This Visit    Hyperlipidemia (Chronic)    Unfavorable cholesterol levels on current regimen having held atorvastatin for 6 weeks.  He is back on it now, and did not complain about memory issues.  I like to see how he does with his upcoming follow-up labs prior to  seeing him back in 6 months.  If it still remains elevated, would consider switching him over to Crestor/rosuvastatin      RBBB (right bundle branch block) (Chronic)    No structural normality noted on echo.  Nothing to suggest prior infarct.      Resistant hypertension - Primary (Chronic)    Blood Pressure well controlled today with changing of medications & his exercise/weight loss efforts.  Normal Renal Dopplers & Echo - no SSx of end-organ damage.  Plan: Continue with current dose of 25 mg twice daily carvedilol, 10 mg amlodipine and lisinopril-HCTZ.  He is also on minoxidil      Systolic murmur    Echo did not suggest significant LVH, there was increased septal thickness not commented on.  This is possibly the source of his murmur being in LVOT gradient and related murmur.  No valve lesions noted.         Current medicines are reviewed at length with the patient today. (+/- concerns) n/a The following changes have been made:Convert from atenolol to carvedilol  Patient Instructions  No change with current medications    Your physician wants you to follow-up in 6 month with DR HARDING.You will receive a reminder letter in the mail two months in advance. If you don't receive a letter, please call our office to schedule the follow-up appointment.     If you need a refill on your cardiac medications before your next appointment, please call your pharmacy.    Studies Ordered:   No orders of the defined types were placed in this encounter.     [name], M.D., M.S. Interventional Cardiologist   Pager # [phone] Phone # [phone] [address]. [place], [place] [address]   Thank you for choosing Heartcare at [facility]!!

## 2017-08-26 ENCOUNTER — Encounter: Payer: Self-pay | Admitting: Cardiology

## 2017-08-26 NOTE — Assessment & Plan Note (Signed)
Unfavorable cholesterol levels on current regimen having held atorvastatin for 6 weeks.  He is back on it now, and did not complain about memory issues.  I like to see how he does with his upcoming follow-up labs prior to seeing him back in 6 months.  If it still remains elevated, would consider switching him over to Crestor/rosuvastatin

## 2017-08-26 NOTE — Assessment & Plan Note (Signed)
Blood Pressure well controlled today with changing of medications & his exercise/weight loss efforts.  Normal Renal Dopplers & Echo - no SSx of end-organ damage.  Plan: Continue with current dose of 25 mg twice daily carvedilol, 10 mg amlodipine and lisinopril-HCTZ.  He is also on minoxidil

## 2017-08-26 NOTE — Assessment & Plan Note (Signed)
Echo did not suggest significant LVH, there was increased septal thickness not commented on.  This is possibly the source of his murmur being in LVOT gradient and related murmur.  No valve lesions noted.

## 2017-08-26 NOTE — Assessment & Plan Note (Signed)
No structural normality noted on echo.  Nothing to suggest prior infarct.

## 2017-09-05 DIAGNOSIS — E663 Overweight: Secondary | ICD-10-CM | POA: Insufficient documentation

## 2017-09-05 DIAGNOSIS — E669 Obesity, unspecified: Secondary | ICD-10-CM | POA: Insufficient documentation

## 2017-09-05 NOTE — Progress Notes (Signed)
FOLLOW UP  Assessment and Plan:   Hypertension Well controlled with current medications  Monitor blood pressure at home; patient to call if consistently greater than 130/80 Continue DASH diet.   Reminder to go to the ER if any CP, SOB, nausea, dizziness, severe HA, changes vision/speech, left arm numbness and tingling and jaw pain.  Cholesterol Currently above goal; pending lab results consider increased dose to 80 mg  Continue low cholesterol diet and exercise.  Check lipid panel.   Prediabetes Continue diet and exercise.  Perform daily foot/skin check, notify office of any concerning changes.  Check A1C  Overweight Long discussion about weight loss, diet, and exercise Recommended diet heavy in fruits and veggies and low in animal meats, cheeses, and dairy products, appropriate calorie intake Discussed ideal weight for height and initial weight goal (220 lb) Will follow up in 3 months  Vitamin D Def Well below goal; recommend supplementation for goal of 60+ Check Vit D level  GERD Well managed on current medications Discussed diet, avoiding triggers and other lifestyle changes  Hypercalcemia/ alk phos elevated x2 Will follow up with [facility] as discussed -    Continue diet and meds as discussed. Further disposition pending results of labs. Discussed med's effects and SE's.   Over 30 minutes of exam, counseling, chart review, and critical decision making was performed.   Future Appointments  Date Time Provider [place]  04/16/2018  9:00 AM Liane Comber, NP [facility] None    ----------------------------------------------------------------------------------------------------------------------  HPI 67 y.o. male  presents for 3 month follow up on hypertension, cholesterol, prediabetes, GERD, weight and vitamin D deficiency.   he has a diagnosis of GERD which is currently managed by prilosec 20 mg  he reports symptoms is currently well controlled, and denies  breakthrough reflux, burning in chest, hoarseness or cough.    BMI is Body mass index is 30.43 kg/m., he has been working on diet and exercise. 20 min lifting weight every morning. He reports back issues have pretty much resolved.  Wt Readings from Last 3 Encounters:  09/06/17 237 lb (107.5 kg)  08/24/17 234 lb (106.1 kg)  06/22/17 239 lb (108.4 kg)   His blood pressure has been controlled at home the majority of the time with occasional elevated values, today their BP is BP: 102/60  He does workout. He denies chest pain, shortness of breath, dizziness.   He is on cholesterol medication (atorvastatin 40 mg daily) and denies myalgias. His cholesterol is not at goal. The cholesterol last visit was:   Lab Results  Component Value Date   CHOL 246 (H) 05/23/2017   HDL 43 05/23/2017   TRIG 138 05/23/2017   CHOLHDL 5.7 (H) 05/23/2017    He has been working on diet and exercise for prediabetes, and denies foot ulcerations, hypoglycemia , increased appetite, nausea, paresthesia of the feet, polydipsia, polyuria, visual disturbances, vomiting and weight loss. Last A1C in the office was:  Lab Results  Component Value Date   HGBA1C 6.1 (H) 04/13/2017   Patient is not on Vitamin D supplement and well below goal:    Lab Results  Component Value Date   VD25OH 19 (L) 04/13/2017        Current Medications:  Current Outpatient Medications on File Prior to Visit  Medication Sig  . amLODipine (NORVASC) 10 MG tablet Take 1 tablet (10 mg total) daily by mouth.  Marland Kitchen aspirin 81 MG tablet Take 81 mg by mouth daily.  Marland Kitchen atorvastatin (LIPITOR) 40 MG tablet Take  1 tablet (40 mg total) by mouth daily.  . carvedilol (COREG) 25 MG tablet Take 1 tablet (25 mg total) by mouth 2 (two) times daily.  Marland Kitchen lisinopril-hydrochlorothiazide (PRINZIDE,ZESTORETIC) 20-12.5 MG tablet Take 1 tablet daily by mouth.  Marland Kitchen omeprazole (PRILOSEC) 20 MG capsule Take 20 mg by mouth daily.  . ranitidine (ZANTAC) 150 MG capsule Take 150  mg by mouth 2 (two) times daily.  . sildenafil (VIAGRA) 100 MG tablet Take 1/4-1/2 tab as needed.   No current facility-administered medications on file prior to visit.      Allergies: No Known Allergies   Medical History:  Past Medical History:  Diagnosis Date  . Hyperlipidemia   . Hypertension    No sign of renal artery stenosis  . Vertigo    Family history- Reviewed and unchanged Social history- Reviewed and unchanged   Review of Systems:  Review of Systems  Constitutional: Negative for malaise/fatigue and weight loss.  HENT: Negative for hearing loss and tinnitus.   Eyes: Negative for blurred vision and double vision.  Respiratory: Negative for cough, shortness of breath and wheezing.   Cardiovascular: Negative for chest pain, palpitations, orthopnea, claudication and leg swelling.  Gastrointestinal: Negative for abdominal pain, blood in stool, constipation, diarrhea, heartburn, melena, nausea and vomiting.  Genitourinary: Negative.   Musculoskeletal: Negative for joint pain and myalgias.  Skin: Negative for rash.  Neurological: Negative for dizziness, tingling, sensory change, weakness and headaches.  Endo/Heme/Allergies: Negative for polydipsia.  Psychiatric/Behavioral: Negative.   All other systems reviewed and are negative.   Physical Exam: BP 102/60   Pulse (!) 59   Temp (!) 97.5 F (36.4 C)   Ht '6\' 2"'  (1.88 m)   Wt 237 lb (107.5 kg)   SpO2 95%   BMI 30.43 kg/m  Wt Readings from Last 3 Encounters:  09/06/17 237 lb (107.5 kg)  08/24/17 234 lb (106.1 kg)  06/22/17 239 lb (108.4 kg)   General Appearance: Well nourished, in no apparent distress. Eyes: PERRLA, EOMs, conjunctiva no swelling or erythema Sinuses: No Frontal/maxillary tenderness ENT/Mouth: Ext aud canals clear, TMs without erythema, bulging. No erythema, swelling, or exudate on post pharynx.  Tonsils not swollen or erythematous. Hearing normal.  Neck: Supple, thyroid normal.  Respiratory:  Respiratory effort normal, BS equal bilaterally without rales, rhonchi, wheezing or stridor.  Cardio: RRR with no MRGs. Brisk peripheral pulses without edema.  Abdomen: Soft, + BS.  Non tender, no guarding, rebound, hernias, masses. Lymphatics: Non tender without lymphadenopathy.  Musculoskeletal: Full ROM, 5/5 strength, Normal gait Skin: Warm, dry without rashes, lesions, ecchymosis.  Neuro: Cranial nerves intact. No cerebellar symptoms.  Psych: Awake and oriented X 3, normal affect, Insight and Judgment appropriate.    Izora Ribas, NP 9:24 AM [facility]

## 2017-09-06 ENCOUNTER — Encounter: Payer: Self-pay | Admitting: Adult Health

## 2017-09-06 ENCOUNTER — Ambulatory Visit: Payer: 59 | Admitting: Adult Health

## 2017-09-06 VITALS — BP 102/60 | HR 59 | Temp 97.5°F | Ht 74.0 in | Wt 237.0 lb

## 2017-09-06 DIAGNOSIS — E782 Mixed hyperlipidemia: Secondary | ICD-10-CM | POA: Diagnosis not present

## 2017-09-06 DIAGNOSIS — E559 Vitamin D deficiency, unspecified: Secondary | ICD-10-CM | POA: Diagnosis not present

## 2017-09-06 DIAGNOSIS — I10 Essential (primary) hypertension: Secondary | ICD-10-CM

## 2017-09-06 DIAGNOSIS — Z79899 Other long term (current) drug therapy: Secondary | ICD-10-CM | POA: Diagnosis not present

## 2017-09-06 DIAGNOSIS — E663 Overweight: Secondary | ICD-10-CM | POA: Diagnosis not present

## 2017-09-06 DIAGNOSIS — K219 Gastro-esophageal reflux disease without esophagitis: Secondary | ICD-10-CM | POA: Diagnosis not present

## 2017-09-06 DIAGNOSIS — R7303 Prediabetes: Secondary | ICD-10-CM | POA: Diagnosis not present

## 2017-09-06 MED ORDER — MINOXIDIL 10 MG PO TABS: 10.0000 mg | ORAL_TABLET | Freq: Every day | ORAL | 1 refills | Status: DC

## 2017-09-06 NOTE — Patient Instructions (Signed)
When it comes to diets, agreement about the perfect plan isn't easy to find, even among the experts. Experts at the [place] developed an idea known as the Healthy Eating Plate. Just imagine a plate divided into logical, healthy portions.  The emphasis is on diet quality:  Load up on vegetables and fruits - one-half of your plate: Aim for color and variety, and remember that potatoes don't count.  Go for whole grains - one-quarter of your plate: Whole wheat, barley, wheat berries, quinoa, oats, brown rice, and foods made with them. If you want pasta, go with whole wheat pasta.  Protein power - one-quarter of your plate: Fish, chicken, beans, and nuts are all healthy, versatile protein sources. Limit red meat.  The diet, however, does go beyond the plate, offering a few other suggestions.  Use healthy plant oils, such as olive, canola, soy, corn, sunflower and peanut. Check the labels, and avoid partially hydrogenated oil, which have unhealthy trans fats.  If you're thirsty, drink water. Coffee and tea are good in moderation, but skip sugary drinks and limit milk and dairy products to one or two daily servings.  The type of carbohydrate in the diet is more important than the amount. Some sources of carbohydrates, such as vegetables, fruits, whole grains, and beans-are healthier than others.  Finally, stay active.    Intermittent fasting is more about strategy than starvation. It's meant to reset your body in different ways, hopefully with fitness and nutrition changes as a result.  Like any big switchover, though, results may vary when it comes down to the individual level. What works for your friends may not work for you, or vice versa. That's why it's helpful to play around with variations on intermittent fasting and healthy habits and find what works best for you.  WHAT IS INTERMITTENT FASTING AND WHY DO IT?  Intermittent fasting doesn't involve specific  foods, but rather, a strict schedule regarding when you eat. Also called "time-restricted eating," the tactic has been praised for its contribution to weight loss, improved body composition, and decreased cravings. Preliminary research also suggests it may be beneficial for glucose tolerance, hormone regulation, better muscle mass and lower body fat.  Part of its appeal is the simplicity of the effort. Unlike some other trends, there's no calculations to intermittent fasting.  You simply eat within a certain block of time, usually a window of 8-10 hours. In the other big block of time - about 14-16 hours, including when you're asleep - you don't eat anything, not even snacks. You can drink water, coffee, tea or any other beverage that doesn't have calories.  For example, if you like having a late dinner, you might skip breakfast and have your first meal at noon and your last meal of the day at 8 p.m., and then not eat until noon again the next day.  IDEAS FOR GETTING STARTED  If you're new to the strategy, it may be helpful to eat within the typical circadian rhythm and keep eating within daylight hours. This can be especially beneficial if you're looking at intermittent fasting for weight-loss goals.  So first try only eating between 12pm to 8pm.  Outside of this time you may have water, black coffee, and hot tea. You may not eat it drink anything that has carbs, sugars, OR artificial sugars like diet soda.   Like any major eating and fitness shift, it can take time to find the perfect fit, so  don't be afraid to experiment with different options - including ditching intermittent fasting altogether if it's simply not for you. But if it is, you may be surprised by some of the benefits that come along with the strategy.

## 2017-09-07 ENCOUNTER — Ambulatory Visit: Payer: Self-pay | Admitting: Adult Health

## 2017-09-07 LAB — LIPID PANEL
CHOL/HDL RATIO: 3.8 (calc) (ref <5.0)
CHOLESTEROL: 181 mg/dL (ref <200)
HDL: 48 mg/dL (ref >40)
LDL CHOLESTEROL (CALC): 115 mg/dL — AB
Non-HDL Cholesterol (Calc): 133 mg/dL (calc) — ABNORMAL HIGH (ref <130)
Triglycerides: 83 mg/dL (ref <150)

## 2017-09-07 LAB — HEMOGLOBIN A1C
Hgb A1c MFr Bld: 6.2 % of total Hgb — ABNORMAL HIGH (ref <5.7)
Mean Plasma Glucose: 131 (calc)
eAG (mmol/L): 7.3 (calc)

## 2017-09-07 LAB — CBC WITH DIFFERENTIAL/PLATELET
BASOS PCT: 2.2 %
Basophils Absolute: 112 cells/uL (ref 0–200)
EOS PCT: 9.7 %
Eosinophils Absolute: 495 cells/uL (ref 15–500)
HCT: 39.2 % (ref 38.5–50.0)
Hemoglobin: 13.3 g/dL (ref 13.2–17.1)
Lymphs Abs: 1311 cells/uL (ref 850–3900)
MCH: 30 pg (ref 27.0–33.0)
MCHC: 33.9 g/dL (ref 32.0–36.0)
MCV: 88.3 fL (ref 80.0–100.0)
MONOS PCT: 12.1 %
MPV: 9.5 fL (ref 7.5–12.5)
NEUTROS PCT: 50.3 %
Neutro Abs: 2565 cells/uL (ref 1500–7800)
PLATELETS: 365 10*3/uL (ref 140–400)
RBC: 4.44 10*6/uL (ref 4.20–5.80)
RDW: 12.9 % (ref 11.0–15.0)
TOTAL LYMPHOCYTE: 25.7 %
WBC: 5.1 10*3/uL (ref 3.8–10.8)
WBCMIX: 617 {cells}/uL (ref 200–950)

## 2017-09-07 LAB — BASIC METABOLIC PANEL WITH GFR
BUN/Creatinine Ratio: 16 (calc) (ref 6–22)
BUN: 20 mg/dL (ref 7–25)
CALCIUM: 10.4 mg/dL — AB (ref 8.6–10.3)
CHLORIDE: 104 mmol/L (ref 98–110)
CO2: 28 mmol/L (ref 20–32)
Creat: 1.29 mg/dL — ABNORMAL HIGH (ref 0.70–1.25)
GFR, Est African American: 67 mL/min/{1.73_m2} (ref >=60)
GFR, Est Non African American: 58 mL/min/{1.73_m2} — ABNORMAL LOW (ref >=60)
GLUCOSE: 112 mg/dL — AB (ref 65–99)
Potassium: 4.5 mmol/L (ref 3.5–5.3)
Sodium: 138 mmol/L (ref 135–146)

## 2017-09-07 LAB — HEPATIC FUNCTION PANEL
AG Ratio: 2.2 (calc) (ref 1.0–2.5)
ALBUMIN MSPROF: 4.8 g/dL (ref 3.6–5.1)
ALT: 30 U/L (ref 9–46)
AST: 28 U/L (ref 10–35)
Alkaline phosphatase (APISO): 25 U/L — ABNORMAL LOW (ref 40–115)
BILIRUBIN DIRECT: 0.1 mg/dL (ref 0.0–0.2)
GLOBULIN: 2.2 g/dL (ref 1.9–3.7)
Indirect Bilirubin: 0.4 mg/dL (calc) (ref 0.2–1.2)
TOTAL PROTEIN: 7 g/dL (ref 6.1–8.1)
Total Bilirubin: 0.5 mg/dL (ref 0.2–1.2)

## 2017-09-07 LAB — PTH, INTACT AND CALCIUM
CALCIUM: 10.4 mg/dL — AB (ref 8.6–10.3)
PTH: 36 pg/mL (ref 14–64)

## 2017-09-07 LAB — TSH: TSH: 3.82 m[IU]/L (ref 0.40–4.50)

## 2017-09-19 ENCOUNTER — Encounter: Payer: Self-pay | Admitting: Adult Health

## 2017-10-02 ENCOUNTER — Other Ambulatory Visit: Payer: Self-pay

## 2017-10-02 DIAGNOSIS — I10 Essential (primary) hypertension: Secondary | ICD-10-CM

## 2017-10-02 MED ORDER — MINOXIDIL 10 MG PO TABS: 10.0000 mg | ORAL_TABLET | Freq: Every day | ORAL | 1 refills | Status: DC

## 2017-10-19 ENCOUNTER — Encounter: Payer: Self-pay | Admitting: Adult Health

## 2017-10-23 ENCOUNTER — Telehealth: Payer: Self-pay | Admitting: *Deleted

## 2017-10-23 NOTE — Telephone Encounter (Signed)
Called pt to schedule a appointment per Dr Melford Aase he wanted me to get him in today because of my chart message.  Called pt said he's actually feeling better & does not need appointment.

## 2017-11-27 ENCOUNTER — Encounter: Payer: Self-pay | Admitting: Adult Health

## 2017-12-06 NOTE — Progress Notes (Signed)
FOLLOW UP  Assessment and Plan:   Hypertension Well controlled with current medications  Monitor blood pressure at home; patient to call if consistently greater than 130/80 Continue DASH diet.   Reminder to go to the ER if any CP, SOB, nausea, dizziness, severe HA, changes vision/speech, left arm numbness and tingling and jaw pain.  Cholesterol Currently above goal; emphasized diet, continue with atorvastatin daily Continue low cholesterol diet and exercise.  Check lipid panel.   Prediabetes Continue diet and exercise.  Perform daily foot/skin check, notify office of any concerning changes.  Check A1C  Overweight Long discussion about weight loss, diet, and exercise Recommended diet heavy in fruits and veggies and low in animal meats, cheeses, and dairy products, appropriate calorie intake Discussed ideal weight for height Continue with GYM x 4 days a week, working on diet Will follow up in 3 months  Vitamin D Def Below goal at last visit; continue supplementation to maintain goal of 70-100 Check Vit D level  Continue diet and meds as discussed. Further disposition pending results of labs. Discussed med's effects and SE's.   Over 30 minutes of exam, counseling, chart review, and critical decision making was performed.   Future Appointments  Date Time Provider [place]  12/07/2017  8:45 AM Liane Comber, NP [facility] None  04/16/2018  9:00 AM Liane Comber, NP [facility] None    ----------------------------------------------------------------------------------------------------------------------  HPI 67 y.o. male  presents for 3 month follow up on hypertension, cholesterol, prediabetes, weight and vitamin D deficiency.   BMI is Body mass index is 30.56 kg/m., he has been working on diet and exercise. Eating more salads, [facility]  Wt Readings from Last 3 Encounters:  12/07/17 238 lb (108 kg)  09/06/17 237 lb (107.5 kg)  08/24/17 234  lb (106.1 kg)   His blood pressure has been controlled at home, today their BP is BP: 114/66  He does workout. He denies chest pain, shortness of breath, dizziness.   He is on cholesterol medication (atorvastatin 40 mg daily) and denies myalgias. His cholesterol is not at goal. The cholesterol last visit was:   Lab Results  Component Value Date   CHOL 181 09/06/2017   HDL 48 09/06/2017   LDLCALC 115 (H) 09/06/2017   TRIG 83 09/06/2017   CHOLHDL 3.8 09/06/2017    He has been working on diet and exercise for prediabetes, and denies foot ulcerations, increased appetite, nausea, paresthesia of the feet, polydipsia, polyuria, visual disturbances, vomiting and weight loss. Last A1C in the office was:  Lab Results  Component Value Date   HGBA1C 6.2 (H) 09/06/2017   Patient is on Vitamin D supplement.   Lab Results  Component Value Date   VD25OH 19 (L) 04/13/2017        Current Medications:  Current Outpatient Medications on File Prior to Visit  Medication Sig  . amLODipine (NORVASC) 10 MG tablet Take 1 tablet (10 mg total) daily by mouth.  Marland Kitchen atorvastatin (LIPITOR) 40 MG tablet Take 1 tablet (40 mg total) by mouth daily.  . carvedilol (COREG) 25 MG tablet Take 1 tablet (25 mg total) by mouth 2 (two) times daily.  Marland Kitchen lisinopril-hydrochlorothiazide (PRINZIDE,ZESTORETIC) 20-12.5 MG tablet Take 1 tablet daily by mouth.  . minoxidil (LONITEN) 10 MG tablet Take 1 tablet (10 mg total) by mouth daily.  Marland Kitchen omeprazole (PRILOSEC) 20 MG capsule Take 20 mg by mouth daily.  . sildenafil (VIAGRA) 100 MG tablet Take 1/4-1/2 tab as needed.  Marland Kitchen  aspirin 81 MG tablet Take 81 mg by mouth daily.   No current facility-administered medications on file prior to visit.      Allergies: No Known Allergies   Medical History:  Past Medical History:  Diagnosis Date  . Hyperlipidemia   . Hypertension    No sign of renal artery stenosis  . Vertigo    Family history- Reviewed and unchanged Social history-  Reviewed and unchanged   Review of Systems:  Review of Systems  Constitutional: Negative for malaise/fatigue and weight loss.  HENT: Negative for hearing loss and tinnitus.   Eyes: Negative for blurred vision and double vision.  Respiratory: Negative for cough, shortness of breath and wheezing.   Cardiovascular: Negative for chest pain, palpitations, orthopnea, claudication and leg swelling.  Gastrointestinal: Negative for abdominal pain, blood in stool, constipation, diarrhea, heartburn, melena, nausea and vomiting.  Genitourinary: Negative.   Musculoskeletal: Negative for joint pain and myalgias.  Skin: Negative for rash.  Neurological: Negative for dizziness, tingling, sensory change, weakness and headaches.  Endo/Heme/Allergies: Negative for polydipsia.  Psychiatric/Behavioral: Negative.   All other systems reviewed and are negative.    Physical Exam: BP 114/66   Pulse 66   Temp (!) 97.5 F (36.4 C)   Ht 6\' 2"  (1.88 m)   Wt 238 lb (108 kg)   SpO2 96%   BMI 30.56 kg/m  Wt Readings from Last 3 Encounters:  12/07/17 238 lb (108 kg)  09/06/17 237 lb (107.5 kg)  08/24/17 234 lb (106.1 kg)   General Appearance: Well nourished, in no apparent distress. Eyes: PERRLA, EOMs, conjunctiva no swelling or erythema Sinuses: No Frontal/maxillary tenderness ENT/Mouth: Ext aud canals clear, TMs without erythema, bulging. No erythema, swelling, or exudate on post pharynx.  Tonsils not swollen or erythematous. Hearing normal.  Neck: Supple, thyroid normal.  Respiratory: Respiratory effort normal, BS equal bilaterally without rales, rhonchi, wheezing or stridor.  Cardio: RRR with no MRGs. Brisk peripheral pulses without edema.  Abdomen: Soft, + BS.  Non tender, no guarding, rebound, hernias, masses. Lymphatics: Non tender without lymphadenopathy.  Musculoskeletal: Full ROM, 5/5 strength, Normal gait Skin: Warm, dry without rashes, lesions, ecchymosis.  Neuro: Cranial nerves intact. No  cerebellar symptoms.  Psych: Awake and oriented X 3, normal affect, Insight and Judgment appropriate.    Izora Ribas, NP 8:42 AM [facility]

## 2017-12-07 ENCOUNTER — Encounter: Payer: Self-pay | Admitting: Adult Health

## 2017-12-07 ENCOUNTER — Ambulatory Visit: Payer: 59 | Admitting: Adult Health

## 2017-12-07 VITALS — BP 114/66 | HR 66 | Temp 97.5°F | Ht 74.0 in | Wt 238.0 lb

## 2017-12-07 DIAGNOSIS — E559 Vitamin D deficiency, unspecified: Secondary | ICD-10-CM | POA: Diagnosis not present

## 2017-12-07 DIAGNOSIS — I10 Essential (primary) hypertension: Secondary | ICD-10-CM | POA: Diagnosis not present

## 2017-12-07 DIAGNOSIS — R7303 Prediabetes: Secondary | ICD-10-CM | POA: Diagnosis not present

## 2017-12-07 DIAGNOSIS — Z79899 Other long term (current) drug therapy: Secondary | ICD-10-CM | POA: Diagnosis not present

## 2017-12-07 DIAGNOSIS — K219 Gastro-esophageal reflux disease without esophagitis: Secondary | ICD-10-CM | POA: Diagnosis not present

## 2017-12-07 DIAGNOSIS — E782 Mixed hyperlipidemia: Secondary | ICD-10-CM

## 2017-12-07 DIAGNOSIS — E663 Overweight: Secondary | ICD-10-CM

## 2017-12-07 NOTE — Patient Instructions (Addendum)
Look for medical massage - [facility] to [employer]     Aim for 7+ servings of fruits and vegetables daily  80+ fluid ounces of water or unsweet tea for healthy kidneys  Limit alcohol intake  Limit animal fats in diet for cholesterol and heart health - choose grass fed whenever available  Aim for low stress - take time to unwind and care for your mental health  Aim for 150 min of moderate intensity exercise weekly for heart health, and weights twice weekly for bone health  Aim for 7-9 hours of sleep daily      When it comes to diets, agreement about the perfect plan isn't easy to find, even among the experts. Experts at the [place] developed an idea known as the Healthy Eating Plate. Just imagine a plate divided into logical, healthy portions.  The emphasis is on diet quality:  Load up on vegetables and fruits - one-half of your plate: Aim for color and variety, and remember that potatoes don't count.  Go for whole grains - one-quarter of your plate: Whole wheat, barley, wheat berries, quinoa, oats, brown rice, and foods made with them. If you want pasta, go with whole wheat pasta.  Protein power - one-quarter of your plate: Fish, chicken, beans, and nuts are all healthy, versatile protein sources. Limit red meat.  The diet, however, does go beyond the plate, offering a few other suggestions.  Use healthy plant oils, such as olive, canola, soy, corn, sunflower and peanut. Check the labels, and avoid partially hydrogenated oil, which have unhealthy trans fats.  If you're thirsty, drink water. Coffee and tea are good in moderation, but skip sugary drinks and limit milk and dairy products to one or two daily servings.  The type of carbohydrate in the diet is more important than the amount. Some sources of carbohydrates, such as vegetables, fruits, whole grains, and beans-are healthier than others.  Finally, stay active.     Neck Exercises Neck  exercises can be important for many reasons:  They can help you to improve and maintain flexibility in your neck. This can be especially important as you age.  They can help to make your neck stronger. This can make movement easier.  They can reduce or prevent neck pain.  They may help your upper back.  Ask your health care provider which neck exercises would be best for you. Exercises Neck Press Repeat this exercise 10 times. Do it first thing in the morning and right before bed or as told by your health care provider. 1. Lie on your back on a firm bed or on the floor with a pillow under your head. 2. Use your neck muscles to push your head down on the pillow and straighten your spine. 3. Hold the position as well as you can. Keep your head facing up and your chin tucked. 4. Slowly count to 5 while holding this position. 5. Relax for a few seconds. Then repeat.  Isometric Strengthening Do a full set of these exercises 2 times a day or as told by your health care provider. 1. Sit in a supportive chair and place your hand on your forehead. 2. Push forward with your head and neck while pushing back with your hand. Hold for 10 seconds. 3. Relax. Then repeat the exercise 3 times. 4. Next, do thesequence again, this time putting your hand against the back of your head. Use your head and neck to push backward  against the hand pressure. 5. Finally, do the same exercise on either side of your head, pushing sideways against the pressure of your hand.  Prone Head Lifts Repeat this exercise 5 times. Do this 2 times a day or as told by your health care provider. 1. Lie face-down, resting on your elbows so that your chest and upper back are raised. 2. Start with your head facing downward, near your chest. Position your chin either on or near your chest. 3. Slowly lift your head upward. Lift until you are looking straight ahead. Then continue lifting your head as far back as you can  stretch. 4. Hold your head up for 5 seconds. Then slowly lower it to your starting position.  Supine Head Lifts Repeat this exercise 8-10 times. Do this 2 times a day or as told by your health care provider. 1. Lie on your back, bending your knees to point to the ceiling and keeping your feet flat on the floor. 2. Lift your head slowly off the floor, raising your chin toward your chest. 3. Hold for 5 seconds. 4. Relax and repeat.  Scapular Retraction Repeat this exercise 5 times. Do this 2 times a day or as told by your health care provider. 1. Stand with your arms at your sides. Look straight ahead. 2. Slowly pull both shoulders backward and downward until you feel a stretch between your shoulder blades in your upper back. 3. Hold for 10-30 seconds. 4. Relax and repeat.  Contact a health care provider if:  Your neck pain or discomfort gets much worse when you do an exercise.  Your neck pain or discomfort does not improve within 2 hours after you exercise. If you have any of these problems, stop exercising right away. Do not do the exercises again unless your health care provider says that you can. Get help right away if:  You develop sudden, severe neck pain. If this happens, stop exercising right away. Do not do the exercises again unless your health care provider says that you can. Exercises Neck Stretch  Repeat this exercise 3-5 times. 1. Do this exercise while standing or while sitting in a chair. 2. Place your feet flat on the floor, shoulder-width apart. 3. Slowly turn your head to the right. Turn it all the way to the right so you can look over your right shoulder. Do not tilt or tip your head. 4. Hold this position for 10-30 seconds. 5. Slowly turn your head to the left, to look over your left shoulder. 6. Hold this position for 10-30 seconds.  Neck Retraction Repeat this exercise 8-10 times. Do this 3-4 times a day or as told by your health care provider. 1. Do this  exercise while standing or while sitting in a sturdy chair. 2. Look straight ahead. Do not bend your neck. 3. Use your fingers to push your chin backward. Do not bend your neck for this movement. Continue to face straight ahead. If you are doing the exercise properly, you will feel a slight sensation in your throat and a stretch at the back of your neck. 4. Hold the stretch for 1-2 seconds. Relax and repeat.  This information is not intended to replace advice given to you by your health care provider. Make sure you discuss any questions you have with your health care provider. Document Released: 06/01/2015 Document Revised: 11/26/2015 Document Reviewed: 12/29/2014 Elsevier Interactive Patient Education  [employer].

## 2017-12-13 LAB — LIPID PANEL
Cholesterol: 158 mg/dL (ref <200)
HDL: 38 mg/dL — ABNORMAL LOW (ref >40)
LDL Cholesterol (Calc): 107 mg/dL (calc) — ABNORMAL HIGH
Non-HDL Cholesterol (Calc): 120 mg/dL (calc) (ref <130)
Total CHOL/HDL Ratio: 4.2 (calc) (ref <5.0)
Triglycerides: 50 mg/dL (ref <150)

## 2017-12-13 LAB — COMPLETE METABOLIC PANEL WITH GFR
AG Ratio: 2 (calc) (ref 1.0–2.5)
ALKALINE PHOSPHATASE (APISO): 40 U/L (ref 40–115)
ALT: 25 U/L (ref 9–46)
AST: 22 U/L (ref 10–35)
Albumin: 4.6 g/dL (ref 3.6–5.1)
BILIRUBIN TOTAL: 0.4 mg/dL (ref 0.2–1.2)
BUN: 11 mg/dL (ref 7–25)
CHLORIDE: 105 mmol/L (ref 98–110)
CO2: 27 mmol/L (ref 20–32)
Calcium: 10 mg/dL (ref 8.6–10.3)
Creat: 1.02 mg/dL (ref 0.70–1.25)
GFR, Est African American: 89 mL/min/{1.73_m2} (ref >=60)
GFR, Est Non African American: 77 mL/min/{1.73_m2} (ref >=60)
GLOBULIN: 2.3 g/dL (ref 1.9–3.7)
GLUCOSE: 125 mg/dL — AB (ref 65–99)
Potassium: 4.1 mmol/L (ref 3.5–5.3)
SODIUM: 140 mmol/L (ref 135–146)
Total Protein: 6.9 g/dL (ref 6.1–8.1)

## 2017-12-13 LAB — PTH-RELATED PEPTIDE: PTH-Related Protein (PTH-RP): 12 pg/mL — ABNORMAL LOW (ref 14–27)

## 2017-12-13 LAB — CBC WITH DIFFERENTIAL/PLATELET
Basophils Absolute: 72 cells/uL (ref 0–200)
Basophils Relative: 1.5 %
EOS ABS: 312 {cells}/uL (ref 15–500)
Eosinophils Relative: 6.5 %
HEMATOCRIT: 38.6 % (ref 38.5–50.0)
Hemoglobin: 13.4 g/dL (ref 13.2–17.1)
Lymphs Abs: 1277 cells/uL (ref 850–3900)
MCH: 30.1 pg (ref 27.0–33.0)
MCHC: 34.7 g/dL (ref 32.0–36.0)
MCV: 86.7 fL (ref 80.0–100.0)
MPV: 9.5 fL (ref 7.5–12.5)
Monocytes Relative: 10.7 %
Neutro Abs: 2626 cells/uL (ref 1500–7800)
Neutrophils Relative %: 54.7 %
PLATELETS: 337 10*3/uL (ref 140–400)
RBC: 4.45 10*6/uL (ref 4.20–5.80)
RDW: 12.8 % (ref 11.0–15.0)
TOTAL LYMPHOCYTE: 26.6 %
WBC: 4.8 10*3/uL (ref 3.8–10.8)
WBCMIX: 514 {cells}/uL (ref 200–950)

## 2017-12-13 LAB — VITAMIN D 1,25 DIHYDROXY
VITAMIN D 1, 25 (OH) TOTAL: 34 pg/mL (ref 18–72)
Vitamin D2 1, 25 (OH)2: <8 pg/mL
Vitamin D3 1, 25 (OH)2: 34 pg/mL

## 2017-12-13 LAB — TSH: TSH: 2.12 m[IU]/L (ref 0.40–4.50)

## 2017-12-13 LAB — HEMOGLOBIN A1C
Hgb A1c MFr Bld: 6.2 % of total Hgb — ABNORMAL HIGH (ref <5.7)
Mean Plasma Glucose: 131 (calc)
eAG (mmol/L): 7.3 (calc)

## 2017-12-13 LAB — VITAMIN D 25 HYDROXY (VIT D DEFICIENCY, FRACTURES): Vit D, 25-Hydroxy: 23 ng/mL — ABNORMAL LOW (ref 30–100)

## 2017-12-13 LAB — MAGNESIUM: MAGNESIUM: 2 mg/dL (ref 1.5–2.5)

## 2018-01-08 ENCOUNTER — Encounter: Payer: Self-pay | Admitting: Cardiology

## 2018-01-08 ENCOUNTER — Other Ambulatory Visit: Payer: Self-pay | Admitting: Adult Health

## 2018-01-08 ENCOUNTER — Other Ambulatory Visit: Payer: Self-pay | Admitting: Cardiology

## 2018-01-08 DIAGNOSIS — I10 Essential (primary) hypertension: Secondary | ICD-10-CM

## 2018-01-08 NOTE — Telephone Encounter (Signed)
RN CALLED PHARMACY -  PATIENT HAS NOT HAD 25 MG CARVEDILOL FILLED .  ONLY  12.5 MG CARVEDILOL FILLED SINCE DEC 2018.   RN CALLED PATIENT TO FIND OUT HOW PATIENT  IS TAKING MEDICATION

## 2018-01-09 NOTE — Telephone Encounter (Signed)
PATIENT SENT MESSAGE HE TAKING 12.5 MG CARVEDILOL  TWICE A DAY . E-SENT NEW PRESCRIPTION TO PHARMACY  WITH 3 REFILLS

## 2018-01-11 ENCOUNTER — Encounter: Payer: Self-pay | Admitting: Adult Health

## 2018-02-14 ENCOUNTER — Other Ambulatory Visit: Payer: Self-pay | Admitting: Adult Health

## 2018-02-14 MED ORDER — PREDNISONE 20 MG PO TABS
ORAL_TABLET | ORAL | 0 refills | Status: DC
Start: 2018-02-14 — End: 2018-03-15

## 2018-03-15 ENCOUNTER — Encounter: Payer: Self-pay | Admitting: Cardiology

## 2018-03-15 ENCOUNTER — Ambulatory Visit: Payer: 59 | Admitting: Cardiology

## 2018-03-15 VITALS — BP 102/64 | HR 65 | Ht 74.0 in | Wt 236.2 lb

## 2018-03-15 DIAGNOSIS — E785 Hyperlipidemia, unspecified: Secondary | ICD-10-CM | POA: Diagnosis not present

## 2018-03-15 DIAGNOSIS — I10 Essential (primary) hypertension: Secondary | ICD-10-CM | POA: Diagnosis not present

## 2018-03-15 NOTE — Patient Instructions (Addendum)
MEDICATION CHANGES    ONCE  LABS ARE OBTAINED BY PRIMARY - CHOLESTEROL . IF LDL IS GREATER THAN 100 . PLEASE START TAKING LIPITOR (ATORVASTATIN) THREE TIMES A WEEK.     Your physician wants you to follow-up in [place]. You will receive a reminder letter in the mail two months in advance. If you don't receive a letter, please call our office to schedule the follow-up appointment.     If you need a refill on your cardiac medications before your next appointment, please call your pharmacy.

## 2018-03-15 NOTE — Progress Notes (Signed)
PCP: Unk Pinto, MD; Liane Comber, NP.  Clinic Note: Chief Complaint  Patient presents with  . Follow-up    No complaints  . Hypertension    HPI: Nathaniel Gill is a 67 y.o. male who is being seen today for 6-7 month f/u  of baseline cardiovascular risk with a history of resistant hypertension, bradycardia and dizziness.   He has a significant family history of CAD, but himself has no history of heart attack or stroke. He does not smoke and does not drink. He was initially seen at the request of Unk Pinto, MD on June 22, 2017 for difficult to control hypertension on multiple medications. -->  He was initially evaluated with a renal duplex and echocardiogram. ->  Initial change was to convert from atenolol to carvedilol.  He himself has never had a heart attack or stroke, and he has never had any significant chest tightness or pressure.  He does not smoke or drink.  Irvine Glorioso was last seen on Aug 24, 2017 with no complaints.  Was making an effort to lose weight and increase his exercise. ~30 min day in AM & TM in evenings (20-30 min) -- HR up to 119.  Was having a hard time keeping weight off. Recent Hospitalizations: None  Studies Personally Reviewed - (if available, images/films reviewed: From Epic Chart or Care Everywhere)  none  Interval History: [name] presents here today doing well with no complaints.  He recently joined the [employer] - TM 15-20 min, weights & machines --. 3-4 x / week.  Now no longer gets SOB with routine activity.  Feeling much better having truly gotten in shape.  He may get a little lightheaded off and on but this is usually short-lived and usually is if he has not been drinking enough.  The only complaint he has today is that for the last few weeks is having bilateral neck pain that usually gets better with some stretching exercises.  Otherwise relatively a symptomatically cardiac standpoint.  Cardiovascular ROS: positive for - mild  end of day swelling.   negative for - chest pain, dyspnea on exertion, irregular heartbeat, murmur, orthopnea, palpitations, paroxysmal nocturnal dyspnea, rapid heart rate, shortness of breath or TIA/ amaurosis fugax or syncope/ near syncope.  No claudication   ROS:  Review of Systems  Constitutional: Negative for malaise/fatigue (intentional) and weight loss (trying - but not successful).  HENT: Negative for congestion and nosebleeds.   Respiratory: Negative for cough, shortness of breath and wheezing.   Cardiovascular: Leg swelling: Mild end of day.  Gastrointestinal: Negative for blood in stool, heartburn and melena.  Genitourinary: Negative for dysuria and hematuria.  Musculoskeletal: Positive for neck pain (bilateral neck pains - last few min.  ). Negative for joint pain (just swelling) and myalgias.  Neurological: Positive for dizziness (rare lightheaded spells - last < 2-3 min). Negative for weakness.  All other systems reviewed and are negative.  I have reviewed and (if needed) personally updated the patient's problem list, medications, allergies, past medical and surgical history, social and family history.   Past Medical History:  Diagnosis Date  . Hyperlipidemia   . Hypertension    No sign of renal artery stenosis  . Vertigo     Past Surgical History:  Procedure Laterality Date  . ABDOMINAL SURGERY  1956   Related to bleeding episode.   Marland Kitchen CARDIAC CATHETERIZATION  2005   no intervention per patient  . CYST REMOVAL HAND Left 1975   Palm  .  SKIN LESION EXCISION    . STAPEDECTOMY Bilateral   . TRANSTHORACIC ECHOCARDIOGRAM  07/2017   Normal LV size and function.  EF 55-60%.  No RWMA.  Basal septal hypertrophy, but no sign of significant hypertensive heart disease.  Only GR 1 DD.  Marland Kitchen TRIGGER FINGER RELEASE Right 2014   Thumb    Current Meds  Medication Sig  . amLODipine (NORVASC) 10 MG tablet Take 1 tablet (10 mg total) daily by mouth.  Marland Kitchen aspirin 81 MG tablet Take 81  mg by mouth daily.  . carvedilol (COREG) 25 MG tablet Take 25 mg by mouth 2 (two) times daily with a meal.  . lisinopril-hydrochlorothiazide (PRINZIDE,ZESTORETIC) 20-12.5 MG tablet Take 1 tablet daily by mouth.  . minoxidil (LONITEN) 10 MG tablet TAKE 1/2 TABLET BY MOUTH DAILY AT NIGHT  . omeprazole (PRILOSEC) 20 MG capsule Take 20 mg by mouth daily.  . sildenafil (VIAGRA) 100 MG tablet Take 1/4-1/2 tab as needed.    No Known Allergies  Social History   Tobacco Use  . Smoking status: Never Smoker  . Smokeless tobacco: Never Used  Substance Use Topics  . Alcohol use: Yes    Comment: Social occasional - once a month  . Drug use: No    Comment: In 35s - cocaine, marijuana   Social History   Social History Narrative  . Not on file    family history includes Arrhythmia in his brother; COPD in his mother; Heart attack in his brother; Heart disease in his mother; Hyperlipidemia in his brother and brother; Hypertension in his brother and brother.  Wt Readings from Last 3 Encounters:  03/15/18 236 lb 3.2 oz (107.1 kg)  12/07/17 238 lb (108 kg)  09/06/17 237 lb (107.5 kg)   PHYSICAL EXAM BP 102/64   Pulse 65   Ht 6\' 2"  (1.88 m)   Wt 236 lb 3.2 oz (107.1 kg)   BMI 30.33 kg/m   @ home 120s-150s/70s  Physical Exam  Constitutional: He is oriented to person, place, and time. He appears well-developed and well-nourished. No distress.  Well-groomed.  Healthy-appearing  HENT:  Head: Normocephalic and atraumatic.  Neck: Normal range of motion. Neck supple. No hepatojugular reflux and no JVD present. Carotid bruit is not present.  Cardiovascular: Normal rate, regular rhythm and intact distal pulses.  No extrasystoles are present. PMI is not displaced. Exam reveals gallop and S4 (Soft). Exam reveals no friction rub.  Murmur (Soft SM and RUSB and midline.) heard. Pulmonary/Chest: Effort normal and breath sounds normal. No respiratory distress. He has no wheezes. He has no rales.    Abdominal: Soft. Bowel sounds are normal. He exhibits no distension. There is no tenderness.  Musculoskeletal: Normal range of motion. He exhibits no edema.  Neurological: He is alert and oriented to person, place, and time.  Psychiatric: He has a normal mood and affect. His behavior is normal. Thought content normal.  Nursing note and vitals reviewed.   Adult ECG Report Not checked   Other studies Reviewed: Additional studies/ records that were reviewed today include:  Recent Labs:    Lab Results  Component Value Date   CREATININE 1.02 12/07/2017   BUN 11 12/07/2017   NA 140 12/07/2017   K 4.1 12/07/2017   CL 105 12/07/2017   CO2 27 12/07/2017   Lab Results  Component Value Date   CHOL 158 12/07/2017   HDL 38 (L) 12/07/2017   LDLCALC 107 (H) 12/07/2017   TRIG 50 12/07/2017   CHOLHDL  4.2 12/07/2017   LDL in March 115  ASSESSMENT / PLAN: Problem List Items Addressed This Visit    Hyperlipidemia with target LDL less than 100 (Chronic)    Some improvement in the LDL from March to June.  He tells me he is actually not taking his atorvastatin at present.  He should be due to have a recheck soon.  Would have a low threshold for probably restarting atorvastatin if he is not reaching his goal.  May not need the full dose of 40 mg -I suggested that he start taking it 3 times a week.      Relevant Medications   carvedilol (COREG) 25 MG tablet   Resistant hypertension - Primary (Chronic)    Blood pressure is almost borderline low today.  He says this is not the norm for him usually is in the 426S systolic at home.  He is relatively symptomatic and unstable medications. No signs of renal artery stenosis on Dopplers.  No sign of endorgan damage by echo.  Plan: Continue current medications including amlodipine 10 mg daily, carvedilol 25 mg twice daily and lisinopril-HCTZ 20-12.5.  He is also on minoxidil 5 mg nightly which if he continues to have well-controlled pressures would  probably recommend stopping.      Relevant Medications   carvedilol (COREG) 25 MG tablet   Other Relevant Orders   EKG 12-Lead (Completed)      Current medicines are reviewed at length with the patient today. (+/- concerns) n/a The following changes have been made:none  Patient Instructions  MEDICATION CHANGES    ONCE  LABS ARE OBTAINED BY PRIMARY - CHOLESTEROL . IF LDL IS GREATER THAN 100 . PLEASE START TAKING LIPITOR (ATORVASTATIN) THREE TIMES A WEEK.     Your physician wants you to follow-up in [place]. You will receive a reminder letter in the mail two months in advance. If you don't receive a letter, please call our office to schedule the follow-up appointment.     If you need a refill on your cardiac medications before your next appointment, please call your pharmacy.     Studies Ordered:   Orders Placed This Encounter  Procedures  . EKG 12-Lead      [name], M.D., M.S. Interventional Cardiologist   Pager # [phone] Phone # [phone] [address]. [place],  [address]   Thank you for choosing [facility]!!

## 2018-03-17 ENCOUNTER — Encounter: Payer: Self-pay | Admitting: Cardiology

## 2018-03-17 NOTE — Assessment & Plan Note (Signed)
Blood pressure is almost borderline low today.  He says this is not the norm for him usually is in the 700F systolic at home.  He is relatively symptomatic and unstable medications. No signs of renal artery stenosis on Dopplers.  No sign of endorgan damage by echo.  Plan: Continue current medications including amlodipine 10 mg daily, carvedilol 25 mg twice daily and lisinopril-HCTZ 20-12.5.  He is also on minoxidil 5 mg nightly which if he continues to have well-controlled pressures would probably recommend stopping.

## 2018-03-17 NOTE — Assessment & Plan Note (Addendum)
Some improvement in the LDL from March to June.  He tells me he is actually not taking his atorvastatin at present.  He should be due to have a recheck soon.  Would have a low threshold for probably restarting atorvastatin if he is not reaching his goal.  May not need the full dose of 40 mg -I suggested that he start taking it 3 times a week.

## 2018-04-08 ENCOUNTER — Other Ambulatory Visit: Payer: Self-pay | Admitting: Adult Health

## 2018-04-08 ENCOUNTER — Other Ambulatory Visit: Payer: Self-pay | Admitting: Internal Medicine

## 2018-04-08 DIAGNOSIS — E782 Mixed hyperlipidemia: Secondary | ICD-10-CM

## 2018-04-08 DIAGNOSIS — K219 Gastro-esophageal reflux disease without esophagitis: Secondary | ICD-10-CM

## 2018-04-08 DIAGNOSIS — R0989 Other specified symptoms and signs involving the circulatory and respiratory systems: Secondary | ICD-10-CM

## 2018-04-08 MED ORDER — ATORVASTATIN CALCIUM 40 MG PO TABS: ORAL_TABLET | ORAL | 0 refills | Status: DC

## 2018-04-08 NOTE — Progress Notes (Signed)
Patient reports multiple episodes of choking on food in the past several weeks, concurrently reporting feeling throat is narrowing overall (not sudden/acute). Requesting referral to ENT for evaluation and laryngoscopy. This will be provided.

## 2018-04-12 NOTE — Progress Notes (Signed)
Complete Physical  Assessment and Plan:  Diagnoses and all orders for this visit:  Encounter for routine adult health examination with abnormal findings  Resistant hypertension Controlled on current medications Monitor blood pressure at home; call if consistently over 130/80 Continue DASH diet.   Reminder to go to the ER if any CP, SOB, nausea, dizziness, severe HA, changes vision/speech, left arm numbness and tingling and jaw pain.  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 R/t LVH Followed by cardiology  Prediabetes Discussed disease and risks Discussed diet/exercise, weight management  A1C  Overweight (BMI 25.0-29.9) Long discussion about weight loss, diet, and exercise Recommended diet heavy in fruits and veggies and low in animal meats, cheeses, and dairy products, appropriate calorie intake Discussed appropriate weight for height Follow up at next visit  Medication management CBC, CMP/GFR, magnesium, UA  Hyperlipidemia with target LDL less than 100 Continue medications Continue low cholesterol diet and exercise.  Check lipid panel.   Erectile dysfunction due to arterial insufficiency Control blood pressure, cholesterol, glucose, increase exercise.  Sildenafil PRN  Bilateral neck pain/muscle spasm, R sciatica Without spinal tenderness, suggestive of muscular origin Will refer to PT for sciatica stretches, possible dry needling for neck Follow up if not improving, can pursue imaging or refer to ortho   Discussed med's effects and SE's. Screening labs and tests as requested with regular follow-up as  recommended. Over 45 minutes of exam, counseling, chart review, coordination of care and critical decision making was performed for establishment of new patient with complete physical.   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 He has Resistant hypertension; Hyperlipidemia with target LDL less than 100; Degenerative lumbar disc; RBBB (right bundle branch block); Prediabetes; Acid reflux; Vitamin D deficiency; Medication management; Erectile dysfunction; Systolic murmur; and Overweight (BMI 25.0-29.9) on their problem list.   He reports ongoing intermittent neck pain, reports with muscle tenderness, can have bilaterally, denies midline pain, radiating symptoms, parasthesias. He also reports sciatica of RLE without lower back pain which improves within 15 min of awakening.   BMI is Body mass index is 31.19 kg/m., he has been working on diet and exercise.  Wt Readings from Last 3 Encounters:  04/16/18 233 lb 3.2 oz (105.8 kg)  03/15/18 236 lb 3.2 oz (107.1 kg)  12/07/17 238 lb (108 kg)   He exercises nearly every day (cardio/weights 3 days a week), watches his diet, doing intermittent fasting some days by skipping breakfast, and would like to work towards getting off of some medications. Never smoker, drinks very rarely, 3 cups of coffee a day, 4-6x16.9 fl oz bottles of water daily. Performs regular self-testicular exams without concerns.   His blood pressure has been controlled at  home, followed by Dr. Ellyn Hack, today their BP is BP: 110/62  He does workout. He denies chest pain, shortness of breath, dizziness.   He  is on cholesterol medication and denies myalgias. His cholesterol is not at goal. The cholesterol last visit was:   Lab Results  Component Value Date   CHOL 158 12/07/2017   HDL 38 (L) 12/07/2017   LDLCALC 107 (H) 12/07/2017   TRIG 50 12/07/2017   CHOLHDL 4.2 12/07/2017    He has been working on diet and exercise for prediabetes, and denies foot ulcerations, increased appetite, nausea, paresthesia of the feet, polydipsia, polyuria and visual disturbances. Last A1C in the office was:  Lab Results  Component Value Date   HGBA1C 6.2 (H) 12/07/2017   Patient is on Vitamin D supplement.   Lab Results  Component Value Date   VD25OH 23 (L) 12/07/2017     Lab Results  Component Value Date   EXNTZGYF 74 12/07/2017   Lab Results  Component Value Date   PSA 1.3 04/13/2017      Current Medications:  Current Outpatient Medications on File Prior to Visit  Medication Sig Dispense Refill  . amLODipine (NORVASC) 10 MG tablet Take 1 tablet (10 mg total) daily by mouth. 90 tablet 3  . aspirin 81 MG tablet Take 81 mg by mouth daily.    Marland Kitchen atorvastatin (LIPITOR) 40 MG tablet Take 1 tablet daily for Cholesterol 90 tablet 0  . carvedilol (COREG) 12.5 MG tablet Take 12.5 mg by mouth 2 (two) times daily with a meal.    . lisinopril-hydrochlorothiazide (PRINZIDE,ZESTORETIC) 20-12.5 MG tablet Take 1 tablet daily by mouth. 90 tablet 3  . minoxidil (LONITEN) 10 MG tablet TAKE 1/2 TABLET BY MOUTH DAILY AT NIGHT 45 tablet 2  . omeprazole (PRILOSEC) 20 MG capsule Take 20 mg by mouth daily.    . sildenafil (VIAGRA) 100 MG tablet Take 1/4-1/2 tab as needed. 30 tablet 1   No current facility-administered medications on file prior to visit.    Allergies:  No Known Allergies Health Maintenance:   There is no immunization history on file for this patient.  Tetanus: 2014 with laceration Pneumovax: Denies Prevnar 13: Denies Flu vaccine: Denies Zostavax: Denies PPD: negative 2011-2016  DEXA:  n/a Colonoscopy: 2011 normal EGD: 2003? - After several episodes of dysphagia/choking  Eye Exam: Dr. Marland Kitchen 2019, normal Dentist: Dr. Arnell Sieving, [place], last visit 2019  Patient Care Team: Unk Pinto, MD as PCP - General (Internal Medicine)  Medical History:  has Resistant hypertension; Hyperlipidemia with target LDL less than 100; Degenerative lumbar disc; RBBB (right bundle branch block); Prediabetes; Acid reflux; Vitamin D deficiency; Medication management; Erectile dysfunction; Systolic murmur; and Overweight (BMI 25.0-29.9) on their problem list. Surgical History:  He  has a past surgical history that includes Cardiac catheterization (2005); Abdominal surgery (1956); Stapedectomy (Bilateral); Trigger finger release (Right, 2014); Cyst removal hand (Left, 1975); Skin lesion excision; and transthoracic echocardiogram (07/2017). Family History:  His family history includes Arrhythmia in his brother; COPD in his mother; Heart attack in his brother; Heart disease in his mother; Hyperlipidemia in his brother and brother; Hypertension in his brother and brother. Social History:   reports that he has never smoked. He has never used smokeless tobacco. He reports that he drinks alcohol. He reports that he does not use drugs. Review of Systems:  Review of Systems  Constitutional: Negative.  Negative for weight loss.  HENT: Negative for congestion, hearing loss and sore throat.  Eyes: Negative for blurred vision.  Respiratory: Negative for cough, shortness of breath and wheezing.   Cardiovascular: Negative for chest pain and palpitations.  Gastrointestinal: Negative for abdominal pain, blood in stool, constipation, melena and nausea.  Genitourinary: Negative.   Musculoskeletal: Negative for myalgias.  Skin: Negative.   Neurological: Negative for dizziness, sensory change and headaches.  Endo/Heme/Allergies: Negative.  Negative for environmental allergies. Does not bruise/bleed easily.   Psychiatric/Behavioral: Negative.    Physical Exam: Estimated body mass index is 31.19 kg/m as calculated from the following:   Height as of this encounter: 6' 0.5" (1.842 m).   Weight as of this encounter: 233 lb 3.2 oz (105.8 kg). BP 110/62   Pulse 71   Temp (!) 97.3 F (36.3 C)   Ht 6' 0.5" (1.842 m)   Wt 233 lb 3.2 oz (105.8 kg)   SpO2 95%   BMI 31.19 kg/m    General Appearance: Well nourished, in no apparent distress.  Eyes: PERRLA, EOMs, conjunctiva no swelling or erythema, normal fundi and vessels.  Sinuses: No Frontal/maxillary tenderness  ENT/Mouth: Ext aud canals clear, normal light reflex with TMs without erythema, bulging. Good dentition. No erythema, swelling, or exudate on post pharynx. Tonsils not swollen or erythematous. Hearing normal.  Neck: Supple, thyroid normal. No bruits  Respiratory: Respiratory effort normal, BS equal bilaterally without rales, rhonchi, wheezing or stridor.  Cardio: RR, bradycardic, without murmurs, rubs or gallops. Brisk peripheral pulses without edema.  Chest: symmetric, with normal excursions and percussion.  Abdomen: Soft, nontender, no guarding, rebound, hernias, masses, or organomegaly.  Lymphatics: Non tender without lymphadenopathy.  Genitourinary: Patient declines Musculoskeletal: Full ROM all peripheral extremities,5/5 strength, and normal gait. Bony growth/deformity noted to superior aspect of R MCP joint of 1st digit- ROM intact, without signs of acute inflammation.  Skin: Warm, dry without rashes, ecchymosis. Multiple nevi to back; most <6 mm, does have several with irregular borders and coloring Neuro: Cranial nerves intact, reflexes equal bilaterally. Normal muscle tone, no cerebellar symptoms. Sensation intact.  Psych: Awake and oriented X 3, normal affect, Insight and Judgment appropriate.   EKG: just had 03/2018 at cardiology - sinus bradycardia, RBBB   [place] 9:26 AM [facility]

## 2018-04-16 ENCOUNTER — Ambulatory Visit: Payer: 59 | Admitting: Adult Health

## 2018-04-16 ENCOUNTER — Encounter: Payer: Self-pay | Admitting: Adult Health

## 2018-04-16 VITALS — BP 110/62 | HR 71 | Temp 97.3°F | Ht 72.5 in | Wt 233.2 lb

## 2018-04-16 DIAGNOSIS — Z125 Encounter for screening for malignant neoplasm of prostate: Secondary | ICD-10-CM

## 2018-04-16 DIAGNOSIS — Z23 Encounter for immunization: Secondary | ICD-10-CM | POA: Diagnosis not present

## 2018-04-16 DIAGNOSIS — K219 Gastro-esophageal reflux disease without esophagitis: Secondary | ICD-10-CM

## 2018-04-16 DIAGNOSIS — I4510 Unspecified right bundle-branch block: Secondary | ICD-10-CM

## 2018-04-16 DIAGNOSIS — Z79899 Other long term (current) drug therapy: Secondary | ICD-10-CM

## 2018-04-16 DIAGNOSIS — R7303 Prediabetes: Secondary | ICD-10-CM

## 2018-04-16 DIAGNOSIS — Z0000 Encounter for general adult medical examination without abnormal findings: Secondary | ICD-10-CM | POA: Diagnosis not present

## 2018-04-16 DIAGNOSIS — R011 Cardiac murmur, unspecified: Secondary | ICD-10-CM

## 2018-04-16 DIAGNOSIS — E663 Overweight: Secondary | ICD-10-CM

## 2018-04-16 DIAGNOSIS — E785 Hyperlipidemia, unspecified: Secondary | ICD-10-CM

## 2018-04-16 DIAGNOSIS — M62838 Other muscle spasm: Secondary | ICD-10-CM

## 2018-04-16 DIAGNOSIS — N5201 Erectile dysfunction due to arterial insufficiency: Secondary | ICD-10-CM

## 2018-04-16 DIAGNOSIS — M5431 Sciatica, right side: Secondary | ICD-10-CM

## 2018-04-16 DIAGNOSIS — M542 Cervicalgia: Secondary | ICD-10-CM

## 2018-04-16 DIAGNOSIS — Z0001 Encounter for general adult medical examination with abnormal findings: Secondary | ICD-10-CM

## 2018-04-16 DIAGNOSIS — I10 Essential (primary) hypertension: Secondary | ICD-10-CM

## 2018-04-16 DIAGNOSIS — M5136 Other intervertebral disc degeneration, lumbar region: Secondary | ICD-10-CM

## 2018-04-16 DIAGNOSIS — E559 Vitamin D deficiency, unspecified: Secondary | ICD-10-CM

## 2018-04-16 NOTE — Patient Instructions (Addendum)
  Mr. Crossley , Thank you for taking time to come for your Medicare Wellness Visit. I appreciate your ongoing commitment to your health goals. Please review the following plan we discussed and let me know if I can assist you in the future.   These are the goals we discussed: Goals    . LDL CALC < 100    . Weight (lb) < 220 lb (99.8 kg)       This is a list of the screening recommended for you and due dates:  Health Maintenance  Topic Date Due  . Pneumonia vaccines (1 of 2 - PCV13) 08/14/2017  . Flu Shot  02/01/2018  . Colon Cancer Screening  07/05/2019  . Tetanus Vaccine  07/04/2022  .  Hepatitis C: One time screening is recommended by Center for Disease Control  (CDC) for  adults born from [birth year] through 1963.   Completed  . HIV Screening  Completed    Know what a healthy weight is for you (roughly BMI <25) and aim to maintain this  Aim for 7+ servings of fruits and vegetables daily  65-80+ fluid ounces of water or unsweet tea for healthy kidneys  Limit to max 1 drink of alcohol per day; avoid smoking/tobacco  Limit animal fats in diet for cholesterol and heart health - choose grass fed whenever available  Avoid highly processed foods, and foods high in saturated/trans fats  Aim for low stress - take time to unwind and care for your mental health  Aim for 150 min of moderate intensity exercise weekly for heart health, and weights twice weekly for bone health  Aim for 7-9 hours of sleep daily

## 2018-04-17 LAB — COMPLETE METABOLIC PANEL WITH GFR
AG RATIO: 2 (calc) (ref 1.0–2.5)
ALKALINE PHOSPHATASE (APISO): 38 U/L — AB (ref 40–115)
ALT: 24 U/L (ref 9–46)
AST: 21 U/L (ref 10–35)
Albumin: 4.7 g/dL (ref 3.6–5.1)
BILIRUBIN TOTAL: 0.7 mg/dL (ref 0.2–1.2)
BUN: 17 mg/dL (ref 7–25)
CO2: 29 mmol/L (ref 20–32)
CREATININE: 0.99 mg/dL (ref 0.70–1.25)
Calcium: 10.4 mg/dL — ABNORMAL HIGH (ref 8.6–10.3)
Chloride: 101 mmol/L (ref 98–110)
GFR, Est African American: 92 mL/min/{1.73_m2} (ref >=60)
GFR, Est Non African American: 80 mL/min/{1.73_m2} (ref >=60)
GLOBULIN: 2.4 g/dL (ref 1.9–3.7)
Glucose, Bld: 111 mg/dL — ABNORMAL HIGH (ref 65–99)
Potassium: 4.4 mmol/L (ref 3.5–5.3)
Sodium: 136 mmol/L (ref 135–146)
Total Protein: 7.1 g/dL (ref 6.1–8.1)

## 2018-04-17 LAB — MICROALBUMIN / CREATININE URINE RATIO
Creatinine, Urine: 191 mg/dL (ref 20–320)
MICROALB UR: 0.6 mg/dL
Microalb Creat Ratio: 3 mcg/mg creat (ref <30)

## 2018-04-17 LAB — LIPID PANEL
Cholesterol: 181 mg/dL (ref <200)
HDL: 38 mg/dL — AB (ref >40)
LDL CHOLESTEROL (CALC): 120 mg/dL — AB
Non-HDL Cholesterol (Calc): 143 mg/dL (calc) — ABNORMAL HIGH (ref <130)
TRIGLYCERIDES: 121 mg/dL (ref <150)
Total CHOL/HDL Ratio: 4.8 (calc) (ref <5.0)

## 2018-04-17 LAB — HEMOGLOBIN A1C
Hgb A1c MFr Bld: 6.5 % of total Hgb — ABNORMAL HIGH (ref <5.7)
Mean Plasma Glucose: 140 (calc)
eAG (mmol/L): 7.7 (calc)

## 2018-04-17 LAB — CBC WITH DIFFERENTIAL/PLATELET
BASOS PCT: 2.1 %
Basophils Absolute: 111 cells/uL (ref 0–200)
EOS ABS: 398 {cells}/uL (ref 15–500)
EOS PCT: 7.5 %
HCT: 40.5 % (ref 38.5–50.0)
Hemoglobin: 14 g/dL (ref 13.2–17.1)
Lymphs Abs: 1198 cells/uL (ref 850–3900)
MCH: 29.8 pg (ref 27.0–33.0)
MCHC: 34.6 g/dL (ref 32.0–36.0)
MCV: 86.2 fL (ref 80.0–100.0)
MONOS PCT: 12.1 %
MPV: 9.6 fL (ref 7.5–12.5)
NEUTROS ABS: 2952 {cells}/uL (ref 1500–7800)
Neutrophils Relative %: 55.7 %
Platelets: 322 10*3/uL (ref 140–400)
RBC: 4.7 10*6/uL (ref 4.20–5.80)
RDW: 13 % (ref 11.0–15.0)
TOTAL LYMPHOCYTE: 22.6 %
WBC mixed population: 641 cells/uL (ref 200–950)
WBC: 5.3 10*3/uL (ref 3.8–10.8)

## 2018-04-17 LAB — URINALYSIS W MICROSCOPIC + REFLEX CULTURE
BACTERIA UA: NONE SEEN /HPF
Bilirubin Urine: NEGATIVE
GLUCOSE, UA: NEGATIVE
HGB URINE DIPSTICK: NEGATIVE
Hyaline Cast: NONE SEEN /LPF
Ketones, ur: NEGATIVE
LEUKOCYTE ESTERASE: NEGATIVE
Nitrites, Initial: NEGATIVE
Protein, ur: NEGATIVE
RBC / HPF: NONE SEEN /HPF (ref 0–2)
SPECIFIC GRAVITY, URINE: 1.022 (ref 1.001–1.03)
Squamous Epithelial / LPF: NONE SEEN /HPF (ref <=5)
WBC UA: NONE SEEN /HPF (ref 0–5)

## 2018-04-17 LAB — MAGNESIUM: Magnesium: 1.9 mg/dL (ref 1.5–2.5)

## 2018-04-17 LAB — TSH: TSH: 2.8 m[IU]/L (ref 0.40–4.50)

## 2018-04-17 LAB — VITAMIN D 25 HYDROXY (VIT D DEFICIENCY, FRACTURES): VIT D 25 HYDROXY: 32 ng/mL (ref 30–100)

## 2018-04-17 LAB — NO CULTURE INDICATED

## 2018-04-17 LAB — PSA: PSA: 1.3 ng/mL (ref <=4.0)

## 2018-04-23 NOTE — Progress Notes (Signed)
Assessment and Plan:  Nathaniel Gill was seen today for nevus.  Diagnoses and all orders for this visit:  Resistant hypertension Continue medications Monitor blood pressure at home; call if consistently over 130/80 Continue DASH diet.   Reminder to go to the ER if any CP, SOB, nausea, dizziness, severe HA, changes vision/speech, left arm numbness and tingling and jaw pain.  Overweight (BMI 25.0-29.9) Long discussion about weight loss, diet, and exercise, making progress with weight Recommended diet heavy in fruits and veggies and low in animal meats, cheeses, and dairy products, appropriate calorie intake Discussed appropriate weight for height  Follow up at next visit  Seborrheic keratoses, inflamed Liquid nitrogen was use in a 3 freeze and thaw technique. The patient tolerated the procedure well.  1. Patient educated that the area will begin to heal in approximately a week.  2. Warning signs of infection were reviewed.    3. Recommended that the patient use OTC analgesics as needed for pain.    Further disposition pending results of labs. Discussed med's effects and SE's.   Over 30 minutes of exam, counseling, chart review, and critical decision making was performed.   Future Appointments  Date Time Provider [place]  07/18/2018  9:30 AM Liane Comber, NP [facility] None  04/17/2019  9:00 AM Liane Comber, NP [facility] None    ------------------------------------------------------------------------------------------------------------------   HPI 67 y.o.male presents for follow up on weight, htn, and for cryotherapy of 2 frequently erythematous, scaly lesions  to back and right shoulder, changing in shape.   Procedure Details   The risks, benefits, indications, potential complications, and alternatives were explained to the patient and informed consent obtained.  Liquid nitrogen was use in a 3 freeze and thaw technique. The patient tolerated the procedure well.    Condition: Stable  Complications:  None  Diagnosis: Seb. keratoses  BMI is Body mass index is 31.03 kg/m., he has been working on diet and exercise. Wt Readings from Last 3 Encounters:  04/24/18 232 lb (105.2 kg)  04/16/18 233 lb 3.2 oz (105.8 kg)  03/15/18 236 lb 3.2 oz (107.1 kg)   His blood pressure has been controlled at home, today their BP is BP: 120/70  He does workout. He denies chest pain, shortness of breath, dizziness.   Past Medical History:  Diagnosis Date  . Hyperlipidemia   . Hypertension    No sign of renal artery stenosis  . Vertigo      No Known Allergies  Current Outpatient Medications on File Prior to Visit  Medication Sig  . amLODipine (NORVASC) 10 MG tablet Take 1 tablet (10 mg total) daily by mouth.  Marland Kitchen aspirin 81 MG tablet Take 81 mg by mouth daily.  Marland Kitchen atorvastatin (LIPITOR) 40 MG tablet Take 1 tablet daily for Cholesterol  . carvedilol (COREG) 12.5 MG tablet Take 12.5 mg by mouth 2 (two) times daily with a meal.  . lisinopril-hydrochlorothiazide (PRINZIDE,ZESTORETIC) 20-12.5 MG tablet Take 1 tablet daily by mouth.  . minoxidil (LONITEN) 10 MG tablet TAKE 1/2 TABLET BY MOUTH DAILY AT NIGHT  . omeprazole (PRILOSEC) 20 MG capsule Take 20 mg by mouth daily.  . ranitidine (ZANTAC) 150 MG tablet Take 150 mg by mouth daily.  . sildenafil (VIAGRA) 100 MG tablet Take 1/4-1/2 tab as needed.   No current facility-administered medications on file prior to visit.     ROS: all negative except above.   Physical Exam:  BP 120/70   Pulse (!) 56   Temp (!) 97.5 F (36.4  C)   Ht 6' 0.5" (1.842 m)   Wt 232 lb (105.2 kg)   SpO2 97%   BMI 31.03 kg/m   General Appearance: Well nourished, in no apparent distress. Eyes: conjunctiva no swelling or erythema ENT/Mouth: Hearing normal.  Neck: Supple Respiratory: Respiratory effort normal, BS equal bilaterally without rales, rhonchi, wheezing or stridor.  Cardio: RRR with no MRGs. Brisk peripheral pulses  without edema.  Musculoskeletal: ormal gait.  Skin: Warm, dry without rashes, ecchymosis. He has multiple flat, non- suspicious nevi, two lesions, warty with erythematous base to upper back and right shoulder.  Neuro: Normal muscle tone, Sensation intact.  Psych: Awake and oriented X 3, normal affect, Insight and Judgment appropriate.    Nathaniel Ribas, NP 6:16 PM [facility]

## 2018-04-24 ENCOUNTER — Encounter: Payer: Self-pay | Admitting: Adult Health

## 2018-04-24 ENCOUNTER — Ambulatory Visit: Payer: 59 | Admitting: Adult Health

## 2018-04-24 VITALS — BP 120/70 | HR 56 | Temp 97.5°F | Ht 72.5 in | Wt 232.0 lb

## 2018-04-24 DIAGNOSIS — L820 Inflamed seborrheic keratosis: Secondary | ICD-10-CM | POA: Diagnosis not present

## 2018-04-24 DIAGNOSIS — I10 Essential (primary) hypertension: Secondary | ICD-10-CM

## 2018-04-24 DIAGNOSIS — E663 Overweight: Secondary | ICD-10-CM

## 2018-04-24 NOTE — Progress Notes (Deleted)
test

## 2018-04-24 NOTE — Patient Instructions (Addendum)
Low testosterone  - maintain a healthy weight - fat tissue produces estrogen - high intensity interval training - HIIT - or [place] exercise - can look up versions that are gentle for knees - get on zinc 50 mg daily   Cryotherapy   What to Expect:  Liquid nitrogen application will feel like a strong biting pinch for about 3 seconds. The pain quickly fades and is much better within 15 minutes. As the skin thaws out, the chilled nerves will recover function at different rates. This sometimes feels like something is dribbling on the skin for a few seconds, but there is nothing there. It is an illusion of the thawing out process. When I remove the cotton tipped applicator from your skin, there is nothing left on the skin. The frozen area may look like a mild hive for about 20 minutes. It's okay to scratch it. After two days, a scab will form. The scab will come off in about 10-14 days on the face and 1-2 months in other areas. Normal skin cells migrate in from the sides to heal the sore. The frozen area may heal to make a pink area, which usually returns to normal by about 2-4 months. Rarely, a blister will form by the next day. It if does, cover it with a band-aid to protect it. I prefer not to make a blister. If the blister is tense and sore or if you just plain don't like it, I can withdraw the fluid for you using a sterile needle. This is optional, and it is okay not to drain smaller blisters. Just call my secretary and let her know. The destroyed epidermal cells form the roof of the blister. The normal new cells migrate in from the sides and cover the floor of the blister. This reestablishes the normal skin. You will get the best cosmetic result if you leave the blister intact as long as possible. Do not break it or pierce it with needles or pins. The blister may be filled with clear fluid like water or with blood. Both of these are normal. The blister should not have pus in it. If pus develops, please  tell me so I can give you antibiotic pills.  Wound Care Post Operatively and During Healing Usually, people heal well after liquid nitrogen treatment and quickly such as 7-14 days. As you go down the body, things heal more slowly. You can do most things you want to during healing. Just be gentle. The main thing is to avoid infection. It is okay to put on make up gently and to remove it gently such a washing with a mild soap like Dove. You can hide the spots with make up for social events. You can swim in a chlorinated pool even starting the day of treatment. Avoid hot tubs for two weeks. They often contain gram-negative bacteria such as pseudomonas. Showering is preferable to bathtub bathing for the first week. When you get out of the shower, it is okay to use dry skin lotions. Can you put on something to speed healing? No. The skin has an intrinsic healing rate of about 2-4 weeks no matter what you do. If you want to, you can use double antibiotic cream or ointment, Vitamin E cream, and so on as long as you are not allergic to it. But these things should not be necessary.    Marland Kitchen

## 2018-04-24 NOTE — Progress Notes (Deleted)
FOLLOW UP  Assessment and Plan:   Hypertension Well controlled with current medications  Monitor blood pressure at home; patient to call if consistently greater than 130/80 Continue DASH diet.   Reminder to go to the ER if any CP, SOB, nausea, dizziness, severe HA, changes vision/speech, left arm numbness and tingling and jaw pain.  Cholesterol Currently at goal;  Continue low cholesterol diet and exercise.  Check lipid panel.   Diabetes with diabetic neuropathy, unspecified Continue medication: Continue diet and exercise.  Perform daily foot/skin check, notify office of any concerning changes.  Check A1C  Obesity with co morbidities Long discussion about weight loss, diet, and exercise Recommended diet heavy in fruits and veggies and low in animal meats, cheeses, and dairy products, appropriate calorie intake Discussed ideal weight for height (below 2101bs) and initial weight goal (220lbs) Patient will work on increasing walking and dietary modifications Will follow up in 3 months  Vitamin D Def At goal at last visit; continue supplementation to maintain goal of 70-100 Defer Vit D level this visit  Continue diet and meds as discussed. Further disposition pending results of labs. Discussed med's effects and SE's.   Over 30 minutes of exam, counseling, chart review, and critical decision making was performed.   Future Appointments  Date Time Provider [place]  04/24/2018  4:15 PM Liane Comber, NP [facility] None  07/18/2018  9:30 AM Liane Comber, NP [facility] None  04/17/2019  9:00 AM Liane Comber, NP [facility] None    ----------------------------------------------------------------------------------------------------------------------  HPI 67 y.o. male  presents for 3 month follow up on hypertension, cholesterol, diabetes, weight and vitamin D deficiency.   BMI is There is no height or weight on file to calculate BMI., he has not been working on  diet and exercise. Wt Readings from Last 3 Encounters:  04/16/18 233 lb 3.2 oz (105.8 kg)  03/15/18 236 lb 3.2 oz (107.1 kg)  12/07/17 238 lb (108 kg)    His blood pressure has been controlled at home, today their BP is    He does not workout. He denies chest pain, shortness of breath, dizziness.   He is on cholesterol medication Atorvastatin and denies myalgias. His cholesterol is not at goal. The cholesterol last visit was:   Lab Results  Component Value Date   CHOL 181 04/16/2018   HDL 38 (L) 04/16/2018   LDLCALC 120 (H) 04/16/2018   TRIG 121 04/16/2018   CHOLHDL 4.8 04/16/2018    He has not been working on diet and exercise for prediabetes, and denies polydipsia and polyuria. Last A1C in the office was:  Lab Results  Component Value Date   HGBA1C 6.5 (H) 04/16/2018   Patient is on Vitamin D supplement.   Lab Results  Component Value Date   VD25OH 32 04/16/2018        Current Medications:  Current Outpatient Medications on File Prior to Visit  Medication Sig  . amLODipine (NORVASC) 10 MG tablet Take 1 tablet (10 mg total) daily by mouth.  Marland Kitchen aspirin 81 MG tablet Take 81 mg by mouth daily.  Marland Kitchen atorvastatin (LIPITOR) 40 MG tablet Take 1 tablet daily for Cholesterol  . carvedilol (COREG) 12.5 MG tablet Take 12.5 mg by mouth 2 (two) times daily with a meal.  . lisinopril-hydrochlorothiazide (PRINZIDE,ZESTORETIC) 20-12.5 MG tablet Take 1 tablet daily by mouth.  . minoxidil (LONITEN) 10 MG tablet TAKE 1/2 TABLET BY MOUTH DAILY AT NIGHT  . omeprazole (PRILOSEC) 20 MG capsule Take 20 mg by mouth  daily.  . sildenafil (VIAGRA) 100 MG tablet Take 1/4-1/2 tab as needed.   No current facility-administered medications on file prior to visit.      Allergies: No Known Allergies   Medical History:  Past Medical History:  Diagnosis Date  . Hyperlipidemia   . Hypertension    No sign of renal artery stenosis  . Vertigo    Family history- Reviewed and unchanged Social history-  Reviewed and unchanged   Review of Systems:  Review of Systems  Constitutional: Negative.  Negative for chills, diaphoresis, fever, malaise/fatigue and weight loss.  HENT: Negative for congestion, ear discharge, ear pain, hearing loss, nosebleeds, sinus pain and tinnitus.   Eyes: Negative for blurred vision, double vision, photophobia, pain, discharge and redness.  Respiratory: Negative for cough, hemoptysis, sputum production, shortness of breath and wheezing.   Cardiovascular: Positive for palpitations. Negative for chest pain, orthopnea, claudication and PND.       With activity  Gastrointestinal: Negative for abdominal pain, blood in stool, constipation, diarrhea, heartburn, melena, nausea and vomiting.  Genitourinary: Negative for dysuria, flank pain, frequency, hematuria and urgency.  Musculoskeletal: Negative for back pain, falls, joint pain, myalgias and neck pain.  Skin: Negative for itching and rash.  Neurological: Negative for dizziness, tingling and headaches.  Endo/Heme/Allergies: Negative for environmental allergies. Does not bruise/bleed easily.  Psychiatric/Behavioral: Negative for memory loss, substance abuse and suicidal ideas. The patient is not nervous/anxious and does not have insomnia.       Physical Exam: There were no vitals taken for this visit. Wt Readings from Last 3 Encounters:  04/16/18 233 lb 3.2 oz (105.8 kg)  03/15/18 236 lb 3.2 oz (107.1 kg)  12/07/17 238 lb (108 kg)   General Appearance: Well nourished, in no apparent distress. Eyes: PERRLA, EOMs, conjunctiva no swelling or erythema Sinuses: No Frontal/maxillary tenderness ENT/Mouth: Ext aud canals clear, TMs without erythema, bulging. No erythema, swelling, or exudate on post pharynx.  Tonsils not swollen or erythematous. Hearing normal.  Neck: Supple, thyroid normal.  Respiratory: Respiratory effort normal, BS equal bilaterally without rales, rhonchi, wheezing or stridor.  Cardio: RRR with no  MRGs. Brisk peripheral pulses without edema.  Abdomen: Soft, + BS.  Non tender, no guarding, rebound, hernias, masses. Lymphatics: Non tender without lymphadenopathy.  Musculoskeletal: Full ROM, 5/5 strength, Normal gait Skin: Warm, dry without rashes, lesions, ecchymosis.  Neuro: Cranial nerves intact. No cerebellar symptoms.  Psych: Awake and oriented X 3, normal affect, Insight and Judgment appropriate.    Garnet Sierras, NP 10:28 AM [facility]

## 2018-04-26 ENCOUNTER — Ambulatory Visit: Payer: 59 | Admitting: Physical Therapy

## 2018-05-10 ENCOUNTER — Other Ambulatory Visit: Payer: Self-pay | Admitting: Adult Health

## 2018-05-10 DIAGNOSIS — I10 Essential (primary) hypertension: Secondary | ICD-10-CM

## 2018-05-13 ENCOUNTER — Other Ambulatory Visit: Payer: Self-pay | Admitting: Adult Health

## 2018-05-13 DIAGNOSIS — M545 Low back pain, unspecified: Secondary | ICD-10-CM

## 2018-05-13 DIAGNOSIS — M5126 Other intervertebral disc displacement, lumbar region: Secondary | ICD-10-CM

## 2018-05-29 ENCOUNTER — Other Ambulatory Visit: Payer: Self-pay | Admitting: Orthopedic Surgery

## 2018-05-29 DIAGNOSIS — M5136 Other intervertebral disc degeneration, lumbar region: Secondary | ICD-10-CM

## 2018-06-11 ENCOUNTER — Ambulatory Visit
Admission: RE | Admit: 2018-06-11 | Discharge: 2018-06-11 | Disposition: A | Discharge status: Home / Self Care | Payer: 59 | Source: Ambulatory Visit | Attending: Orthopedic Surgery | Admitting: Orthopedic Surgery

## 2018-06-11 DIAGNOSIS — M5136 Other intervertebral disc degeneration, lumbar region: Secondary | ICD-10-CM

## 2018-06-11 MED ORDER — IOHEXOL 180 MG/ML  SOLN
15.0000 mL | Freq: Once | INTRAMUSCULAR | Status: AC | PRN
  Administered 2018-06-11: 15 mL via INTRATHECAL

## 2018-06-11 MED ORDER — IOPAMIDOL (ISOVUE-M 200) INJECTION 41%: 15.0000 mL | Freq: Once | INTRAMUSCULAR | Status: DC

## 2018-06-11 MED ORDER — DIAZEPAM 5 MG PO TABS
5.0000 mg | ORAL_TABLET | Freq: Once | ORAL | Status: AC
  Administered 2018-06-11: 5 mg via ORAL

## 2018-06-11 NOTE — Discharge Instructions (Signed)

## 2018-07-06 ENCOUNTER — Other Ambulatory Visit: Payer: Self-pay | Admitting: Adult Health

## 2018-07-06 ENCOUNTER — Other Ambulatory Visit: Payer: Self-pay | Admitting: Internal Medicine

## 2018-07-06 DIAGNOSIS — E782 Mixed hyperlipidemia: Secondary | ICD-10-CM

## 2018-07-17 NOTE — Progress Notes (Signed)
FOLLOW UP  Assessment and Plan:   Hypertension Well controlled with current medications  Monitor blood pressure at home; patient to call if consistently greater than 130/80 Continue DASH diet.   Reminder to go to the ER if any CP, SOB, nausea, dizziness, severe HA, changes vision/speech, left arm numbness and tingling and jaw pain.  Cholesterol Currently above goal; emphasized diet, discussed possible switch to rosuvastatin should LDL remain significantly above 100 Continue low cholesterol diet and exercise.  Check lipid panel.   Prediabetes Continue diet and exercise, discussed initiating metformin for A1C 7+ Limit fruit juices and other processed carbs, aim for 7% body weight loss, low GI diet Perform daily foot/skin check, notify office of any concerning changes.  Check A1C  Overweight Long discussion about weight loss, diet, and exercise Recommended diet heavy in fruits and veggies and low in animal meats, cheeses, and dairy products, appropriate calorie intake Discussed ideal weight for height Continue with GYM x 4 days a week, working on diet Will follow up in 3 months  Vitamin D Def Below goal at last visit; he will verify current supplement dose continue supplementation to maintain goal of 70-100 Check Vit D level  Continue diet and meds as discussed. Further disposition pending results of labs. Discussed med's effects and SE's.   Over 30 minutes of exam, counseling, chart review, and critical decision making was performed.   Future Appointments  Date Time Provider [place]  04/17/2019  9:00 AM Liane Comber, NP [facility] None    ----------------------------------------------------------------------------------------------------------------------  HPI 68 y.o. male  presents for 3 month follow up on hypertension, cholesterol, prediabetes, weight and vitamin D deficiency.   he has a diagnosis of GERD which is currently managed by ranitidine 150 mg  PRN, he is doing daily 1 table spoon of ACV and this has been particularly beneficial.  he reports symptoms is currently well controlled, and denies breakthrough reflux, burning in chest, hoarseness or cough.    BMI is Body mass index is 31.14 kg/m., he has been working on diet and exercise. Eating healthy choice meals and smaller portions, traveling a lot [facility], plans to restart next week. He drinks mainly water, refills 12 ounce cup x 6 daily =72 ounces Wt Readings from Last 3 Encounters:  07/18/18 232 lb 12.8 oz (105.6 kg)  04/24/18 232 lb (105.2 kg)  04/16/18 233 lb 3.2 oz (105.8 kg)   His blood pressure has been controlled at home, today their BP is BP: 120/72  He does workout. He denies chest pain, shortness of breath, dizziness.   He is on cholesterol medication (atorvastatin 40 mg daily) and denies myalgias. His cholesterol is not at goal. The cholesterol last visit was:   Lab Results  Component Value Date   CHOL 181 04/16/2018   HDL 38 (L) 04/16/2018   LDLCALC 120 (H) 04/16/2018   TRIG 121 04/16/2018   CHOLHDL 4.8 04/16/2018    He has been working on diet and exercise for prediabetes, and denies foot ulcerations, increased appetite, nausea, paresthesia of the feet, polydipsia, polyuria, visual disturbances, vomiting and weight loss. Last A1C in the office was:  Lab Results  Component Value Date   HGBA1C 6.5 (H) 04/16/2018   Patient is on Vitamin D supplement.  Lab Results  Component Value Date   VD25OH 32 04/16/2018       Current Medications:  Current Outpatient Medications on File Prior to Visit  Medication  Sig  . amLODipine (NORVASC) 10 MG tablet TAKE ONE TABLET BY MOUTH DAILY  . aspirin 81 MG tablet Take 81 mg by mouth daily.  Marland Kitchen atorvastatin (LIPITOR) 40 MG tablet TAKE ONE TABLET BY MOUTH DAILY FOR CHOLESTEROL  . carvedilol (COREG) 12.5 MG tablet Take 12.5 mg by mouth 2 (two) times daily with a meal.  .  lisinopril-hydrochlorothiazide (PRINZIDE,ZESTORETIC) 20-12.5 MG tablet TAKE ONE TABLET BY MOUTH DAILY  . minoxidil (LONITEN) 10 MG tablet TAKE 1/2 TABLET BY MOUTH DAILY AT NIGHT  . ranitidine (ZANTAC) 150 MG tablet TAKE ONE TABLET BY MOUTH TWICE A DAY (Patient taking differently: as needed. )  . sildenafil (VIAGRA) 100 MG tablet Take 1/4-1/2 tab as needed.   No current facility-administered medications on file prior to visit.      Allergies: No Known Allergies   Medical History:  Past Medical History:  Diagnosis Date  . Hyperlipidemia   . Hypertension    No sign of renal artery stenosis  . Vertigo    Family history- Reviewed and unchanged Social history- Reviewed and unchanged   Review of Systems:  Review of Systems  Constitutional: Negative for malaise/fatigue and weight loss.  HENT: Negative for hearing loss and tinnitus.   Eyes: Negative for blurred vision and double vision.  Respiratory: Negative for cough, shortness of breath and wheezing.   Cardiovascular: Negative for chest pain, palpitations, orthopnea, claudication and leg swelling.  Gastrointestinal: Negative for abdominal pain, blood in stool, constipation, diarrhea, heartburn, melena, nausea and vomiting.  Genitourinary: Negative.   Musculoskeletal: Negative for joint pain and myalgias.  Skin: Negative for rash.  Neurological: Negative for dizziness, tingling, sensory change, weakness and headaches.  Endo/Heme/Allergies: Negative for polydipsia.  Psychiatric/Behavioral: Negative.   All other systems reviewed and are negative.   Physical Exam: BP 120/72   Pulse 70   Temp (!) 97.5 F (36.4 C)   Ht 6' 0.5" (1.842 m)   Wt 232 lb 12.8 oz (105.6 kg)   SpO2 97%   BMI 31.14 kg/m  Wt Readings from Last 3 Encounters:  07/18/18 232 lb 12.8 oz (105.6 kg)  04/24/18 232 lb (105.2 kg)  04/16/18 233 lb 3.2 oz (105.8 kg)   General Appearance: Well nourished, in no apparent distress. Eyes: PERRLA, EOMs, conjunctiva  no swelling or erythema Sinuses: No Frontal/maxillary tenderness ENT/Mouth: Ext aud canals clear, TMs without erythema, bulging. No erythema, swelling, or exudate on post pharynx.  Tonsils not swollen or erythematous. Hearing normal.  Neck: Supple, thyroid normal.  Respiratory: Respiratory effort normal, BS equal bilaterally without rales, rhonchi, wheezing or stridor.  Cardio: RRR with no MRGs. Brisk peripheral pulses without edema.  Abdomen: Soft, + BS.  Non tender, no guarding, rebound, hernias, masses. Lymphatics: Non tender without lymphadenopathy.  Musculoskeletal: Full ROM, 5/5 strength, Normal gait Skin: Warm, dry without rashes, lesions, ecchymosis.  Neuro: Cranial nerves intact. No cerebellar symptoms.  Psych: Awake and oriented X 3, normal affect, Insight and Judgment appropriate.    Izora Ribas, NP 9:33 AM [facility]

## 2018-07-18 ENCOUNTER — Encounter: Payer: Self-pay | Admitting: Adult Health

## 2018-07-18 ENCOUNTER — Ambulatory Visit (INDEPENDENT_AMBULATORY_CARE_PROVIDER_SITE_OTHER): Payer: 59 | Admitting: Adult Health

## 2018-07-18 VITALS — BP 120/72 | HR 70 | Temp 97.5°F | Ht 72.5 in | Wt 232.8 lb

## 2018-07-18 DIAGNOSIS — I10 Essential (primary) hypertension: Secondary | ICD-10-CM | POA: Diagnosis not present

## 2018-07-18 DIAGNOSIS — E559 Vitamin D deficiency, unspecified: Secondary | ICD-10-CM

## 2018-07-18 DIAGNOSIS — E785 Hyperlipidemia, unspecified: Secondary | ICD-10-CM

## 2018-07-18 DIAGNOSIS — R7303 Prediabetes: Secondary | ICD-10-CM | POA: Diagnosis not present

## 2018-07-18 DIAGNOSIS — Z79899 Other long term (current) drug therapy: Secondary | ICD-10-CM

## 2018-07-18 DIAGNOSIS — E663 Overweight: Secondary | ICD-10-CM

## 2018-07-18 DIAGNOSIS — K219 Gastro-esophageal reflux disease without esophagitis: Secondary | ICD-10-CM

## 2018-07-18 NOTE — Patient Instructions (Addendum)
Goals    . LDL CALC < 100    . Weight (lb) < 220 lb (99.8 kg)       Max zinc for men is 50 mg daily - verify   Check all supplements and doses - send me a message  Are you on D3 (cholecalciferol) - check dose and message me back -     Aim for 7+ servings (1/2 cup each) of fruits and vegetables daily  65-80+ fluid ounces of water or unsweet tea for healthy kidneys  Limit to max 1 drink of alcohol per day; avoid smoking/tobacco  Limit animal fats in diet for cholesterol and heart health - choose grass fed whenever available, limit red meat to 6 oz or less per week  Avoid highly processed foods, and foods high in saturated/trans fats  Aim for low stress - take time to unwind and care for your mental health  Aim for 150 min of moderate intensity exercise weekly for heart health, and weights twice weekly for bone health  Aim for 7-9 hours of sleep daily   Try to limit carbs, particularly processed carbs (sugars, juices, flour products, snacks, etc) - focus on minimally processed, high fiber items   Prediabetes Eating Plan Prediabetes is a condition that causes blood sugar (glucose) levels to be higher than normal. This increases the risk for developing diabetes. In order to prevent diabetes from developing, your health care provider may recommend a diet and other lifestyle changes to help you:  Control your blood glucose levels.  Improve your cholesterol levels.  Manage your blood pressure. Your health care provider may recommend working with a diet and nutrition specialist (dietitian) to make a meal plan that is best for you. What are tips for following this plan? Lifestyle  Set weight loss goals with the help of your health care team. It is recommended that most people with prediabetes lose 7% of their current body weight.  Exercise for at least 30 minutes at least 5 days a week.  Attend a support group or seek ongoing support from a mental health counselor.  Take  over-the-counter and prescription medicines only as told by your health care provider. Reading food labels  Read food labels to check the amount of fat, salt (sodium), and sugar in prepackaged foods. Avoid foods that have: ? Saturated fats. ? Trans fats. ? Added sugars.  Avoid foods that have more than 300 milligrams (mg) of sodium per serving. Limit your daily sodium intake to less than 2,300 mg each day. Shopping  Avoid buying pre-made and processed foods. Cooking  Cook with olive oil. Do not use butter, lard, or ghee.  Bake, broil, grill, or boil foods. Avoid frying. Meal planning   Work with your dietitian to develop an eating plan that is right for you. This may include: ? Tracking how many calories you take in. Use a food diary, notebook, or mobile application to track what you eat at each meal. ? Using the glycemic index (GI) to plan your meals. The index tells you how quickly a food will raise your blood glucose. Choose low-GI foods. These foods take a longer time to raise blood glucose.  Consider following a Mediterranean diet. This diet includes: ? Several servings each day of fresh fruits and vegetables. ? Eating fish at least twice a week. ? Several servings each day of whole grains, beans, nuts, and seeds. ? Using olive oil instead of other fats. ? Moderate alcohol consumption. ? Eating small amounts of  red meat and whole-fat dairy.  If you have high blood pressure, you may need to limit your sodium intake or follow a diet such as the DASH eating plan. DASH is an eating plan that aims to lower high blood pressure. What foods are recommended? The items listed below may not be a complete list. Talk with your dietitian about what dietary choices are best for you. Grains Whole grains, such as whole-wheat or whole-grain breads, crackers, cereals, and pasta. Unsweetened oatmeal. Bulgur. Barley. Quinoa. Brown rice. Corn or whole-wheat flour tortillas or taco  shells. Vegetables Lettuce. Spinach. Peas. Beets. Cauliflower. Cabbage. Broccoli. Carrots. Tomatoes. Squash. Eggplant. Herbs. Peppers. Onions. Cucumbers. Brussels sprouts. Fruits Berries. Bananas. Apples. Oranges. Grapes. Papaya. Mango. Pomegranate. Kiwi. Grapefruit. Cherries. Meats and other protein foods Seafood. Poultry without skin. Lean cuts of pork and beef. Tofu. Eggs. Nuts. Beans. Dairy Low-fat or fat-free dairy products, such as yogurt, cottage cheese, and cheese. Beverages Water. Tea. Coffee. Sugar-free or diet soda. Seltzer water. Lowfat or no-fat milk. Milk alternatives, such as soy or almond milk. Fats and oils Olive oil. Canola oil. Sunflower oil. Grapeseed oil. Avocado. Walnuts. Sweets and desserts Sugar-free or low-fat pudding. Sugar-free or low-fat ice cream and other frozen treats. Seasoning and other foods Herbs. Sodium-free spices. Mustard. Relish. Low-fat, low-sugar ketchup. Low-fat, low-sugar barbecue sauce. Low-fat or fat-free mayonnaise. What foods are not recommended? The items listed below may not be a complete list. Talk with your dietitian about what dietary choices are best for you. Grains Refined white flour and flour products, such as bread, pasta, snack foods, and cereals. Vegetables Canned vegetables. Frozen vegetables with butter or cream sauce. Fruits Fruits canned with syrup. Meats and other protein foods Fatty cuts of meat. Poultry with skin. Breaded or fried meat. Processed meats. Dairy Full-fat yogurt, cheese, or milk. Beverages Sweetened drinks, such as sweet iced tea and soda. Fats and oils Butter. Lard. Ghee. Sweets and desserts Baked goods, such as cake, cupcakes, pastries, cookies, and cheesecake. Seasoning and other foods Spice mixes with added salt. Ketchup. Barbecue sauce. Mayonnaise. Summary  To prevent diabetes from developing, you may need to make diet and other lifestyle changes to help control blood sugar, improve cholesterol  levels, and manage your blood pressure.  Set weight loss goals with the help of your health care team. It is recommended that most people with prediabetes lose 7 percent of their current body weight.  Consider following a Mediterranean diet that includes plenty of fresh fruits and vegetables, whole grains, beans, nuts, seeds, fish, lean meat, low-fat dairy, and healthy oils. This information is not intended to replace advice given to you by your health care provider. Make sure you discuss any questions you have with your health care provider. Document Released: 11/04/2014 Document Revised: 08/24/2016 Document Reviewed: 08/24/2016 Elsevier Interactive Patient Education  2019 [employer]    Rosuvastatin capsules What is this medicine? ROSUVASTATIN (roe SOO va sta tin) is known as an HMG-CoA reductase inhibitor or 'statin'. It lowers cholesterol and triglycerides in the blood. Diet and lifestyle changes are often used with this drug. This medicine may be used for other purposes; ask your health care provider or pharmacist if you have questions. COMMON BRAND NAME(S): Ezallor What should I tell my health care provider before I take this medicine? They need to know if you have any of these conditions: -diabetes -if you often drink alcohol -history of stroke -kidney disease -liver disease -muscle aches or weakness -thyroid disease -an unusual or allergic reaction to  rosuvastatin, other medicines, foods, dyes, or preservatives -pregnant or trying to get pregnant -breast-feeding How should I use this medicine? Take this medicine by mouth with a glass of water. Follow the directions on the prescription label. Swallow the capsules whole. Do not crush or chew this medicine. You may open the capsule and put the contents in 1 teaspoon of applesauce. Swallow the medicine and applesauce right away. Do not chew the medicine or applesauce. You can take this medicine with or without food. Take your  doses at regular intervals. Do not take your medicine more often than directed. Talk to your pediatrician about the use of this medicine in children. Special care may be needed. Overdosage: If you think you have taken too much of this medicine contact a poison control center or emergency room at once. NOTE: This medicine is only for you. Do not share this medicine with others. What if I miss a dose? If you miss a dose, take it as soon as you can. If your next dose is to be taken in less than 12 hours, then do not take the missed dose. Take the next dose at your regular time. Do not take double or extra doses. What may interact with this medicine? Do not take this medicine with any of the following medications: -herbal medicines like red yeast rice This medicine may also interact with the following medications: -alcohol -antacids containing aluminum hydroxide or magnesium hydroxide -cyclosporine -other medicines for high cholesterol -some medicines for HIV infection -warfarin This list may not describe all possible interactions. Give your health care provider a list of all the medicines, herbs, non-prescription drugs, or dietary supplements you use. Also tell them if you smoke, drink alcohol, or use illegal drugs. Some items may interact with your medicine. What should I watch for while using this medicine? Visit your doctor or health care professional for regular check-ups. You may need regular tests to make sure your liver is working properly. Your health care professional may tell you to stop taking this medicine if you develop muscle problems. If your muscle problems do not go away after stopping this medicine, contact your health care professional. Do not become pregnant while taking this medicine. Women should inform their health care professional if they wish to become pregnant or think they might be pregnant. There is a potential for serious side effects to an unborn child. Talk to your  health care professional or pharmacist for more information. Do not breast-feed an infant while taking this medicine. This medicine may affect blood sugar levels. If you have diabetes, check with your doctor or health care professional before you change your diet or the dose of your diabetic medicine. If you are going to need surgery or other procedure, tell your doctor that you are using this medicine. This drug is only part of a total heart-health program. Your doctor or a dietician can suggest a low-cholesterol and low-fat diet to help. Avoid alcohol and smoking, and keep a proper exercise schedule. This medicine may cause a decrease in Co-Enzyme Q-10. You should make sure that you get enough Co-Enzyme Q-10 while you are taking this medicine. Discuss the foods you eat and the vitamins you take with your health care professional. What side effects may I notice from receiving this medicine? Side effects that you should report to your doctor or health care professional as soon as possible: -allergic reactions like skin rash, itching or hives, swelling of the face, lips, or tongue -dark urine -fever -  joint pain -muscle cramps, pain -redness, blistering, peeling or loosening of the skin, including inside the mouth -trouble passing urine or change in the amount of urine -unusually weak or tired -yellowing of the eyes or skin Side effects that usually do not require medical attention (report these to your doctor or health care professional if they continue or are bothersome): -constipation -heartburn -nausea -stomach gas, pain, upset This list may not describe all possible side effects. Call your doctor for medical advice about side effects. You may report side effects to FDA at 1-800-FDA-1088. Where should I keep my medicine? Keep out of the reach of children. Store at room temperature between 20 and 25 degrees C (68 and 77 degrees F). Keep container tightly closed (protect from moisture). Throw  away any unused medicine after the expiration date. NOTE: This sheet is a summary. It may not cover all possible information. If you have questions about this medicine, talk to your doctor, pharmacist, or health care provider.  2019 Elsevier/Gold Standard (2017-10-26 14:59:10)     Metformin tablets What is this medicine? METFORMIN (met FOR min) is used to treat type 2 diabetes. It helps to control blood sugar. Treatment is combined with diet and exercise. This medicine can be used alone or with other medicines for diabetes. This medicine may be used for other purposes; ask your health care provider or pharmacist if you have questions. COMMON BRAND NAME(S): Glucophage What should I tell my health care provider before I take this medicine? They need to know if you have any of these conditions: -anemia -dehydration -heart disease -frequently drink alcohol-containing beverages -kidney disease -liver disease -polycystic ovary syndrome -serious infection or injury -vomiting -an unusual or allergic reaction to metformin, other medicines, foods, dyes, or preservatives -pregnant or trying to get pregnant -breast-feeding How should I use this medicine? Take this medicine by mouth with a glass of water. Follow the directions on the prescription label. Take this medicine with food. Take your medicine at regular intervals. Do not take your medicine more often than directed. Do not stop taking except on your doctor's advice. Talk to your pediatrician regarding the use of this medicine in children. While this drug may be prescribed for children as young as 55 years of age for selected conditions, precautions do apply. Overdosage: If you think you have taken too much of this medicine contact a poison control center or emergency room at once. NOTE: This medicine is only for you. Do not share this medicine with others. What if I miss a dose? If you miss a dose, take it as soon as you can. If it is  almost time for your next dose, take only that dose. Do not take double or extra doses. What may interact with this medicine? Do not take this medicine with any of the following medications: -certain contrast medicines given before X-rays, CT scans, MRI, or other procedures -dofetilide This medicine may also interact with the following medications: -acetazolamide -alcohol -certain antivirals for HIV or hepatitis -certain medicines for blood pressure, heart disease, irregular heart beat -cimetidine -dichlorphenamide -digoxin -diuretics -male hormones, like estrogens or progestins and birth control pills -glycopyrrolate -isoniazid -lamotrigine -memantine -methazolamide -metoclopramide -midodrine -niacin -phenothiazines like chlorpromazine, mesoridazine, prochlorperazine, thioridazine -phenytoin -ranolazine -steroid medicines like prednisone or cortisone -stimulant medicines for attention disorders, weight loss, or to stay awake -thyroid medicines -topiramate -trospium -vandetanib -zonisamide This list may not describe all possible interactions. Give your health care provider a list of all the medicines, herbs, non-prescription drugs,  or dietary supplements you use. Also tell them if you smoke, drink alcohol, or use illegal drugs. Some items may interact with your medicine. What should I watch for while using this medicine? Visit your doctor or health care professional for regular checks on your progress. A test called the HbA1C (A1C) will be monitored. This is a simple blood test. It measures your blood sugar control over the last 2 to 3 months. You will receive this test every 3 to 6 months. Learn how to check your blood sugar. Learn the symptoms of low and high blood sugar and how to manage them. Always carry a quick-source of sugar with you in case you have symptoms of low blood sugar. Examples include hard sugar candy or glucose tablets. Make sure others know that you can  choke if you eat or drink when you develop serious symptoms of low blood sugar, such as seizures or unconsciousness. They must get medical help at once. Tell your doctor or health care professional if you have high blood sugar. You might need to change the dose of your medicine. If you are sick or exercising more than usual, you might need to change the dose of your medicine. Do not skip meals. Ask your doctor or health care professional if you should avoid alcohol. Many nonprescription cough and cold products contain sugar or alcohol. These can affect blood sugar. This medicine may cause ovulation in premenopausal women who do not have regular monthly periods. This may increase your chances of becoming pregnant. You should not take this medicine if you become pregnant or think you may be pregnant. Talk with your doctor or health care professional about your birth control options while taking this medicine. Contact your doctor or health care professional right away if you think you are pregnant. If you are going to need surgery, a MRI, CT scan, or other procedure, tell your doctor that you are taking this medicine. You may need to stop taking this medicine before the procedure. Wear a medical ID bracelet or chain, and carry a card that describes your disease and details of your medicine and dosage times. This medicine may cause a decrease in folic acid and vitamin B12. You should make sure that you get enough vitamins while you are taking this medicine. Discuss the foods you eat and the vitamins you take with your health care professional. What side effects may I notice from receiving this medicine? Side effects that you should report to your doctor or health care professional as soon as possible: -allergic reactions like skin rash, itching or hives, swelling of the face, lips, or tongue -breathing problems -feeling faint or lightheaded, falls -muscle aches or pains -signs and symptoms of low blood  sugar such as feeling anxious, confusion, dizziness, increased hunger, unusually weak or tired, sweating, shakiness, cold, irritable, headache, blurred vision, fast heartbeat, loss of consciousness -slow or irregular heartbeat -unusual stomach pain or discomfort -unusually tired or weak Side effects that usually do not require medical attention (report to your doctor or health care professional if they continue or are bothersome): -diarrhea -headache -heartburn -metallic taste in mouth -nausea -stomach gas, upset This list may not describe all possible side effects. Call your doctor for medical advice about side effects. You may report side effects to FDA at 1-800-FDA-1088. Where should I keep my medicine? Keep out of the reach of children. Store at room temperature between 15 and 30 degrees C (59 and 86 degrees F). Protect from moisture and light.  Throw away any unused medicine after the expiration date. NOTE: This sheet is a summary. It may not cover all possible information. If you have questions about this medicine, talk to your doctor, pharmacist, or health care provider.  2019 Elsevier/Gold Standard (2017-07-27 19:15:19)

## 2018-07-19 ENCOUNTER — Other Ambulatory Visit: Payer: Self-pay | Admitting: Adult Health

## 2018-07-19 LAB — LIPID PANEL
Cholesterol: 184 mg/dL (ref <200)
HDL: 34 mg/dL — ABNORMAL LOW (ref >40)
LDL Cholesterol (Calc): 120 mg/dL (calc) — ABNORMAL HIGH
Non-HDL Cholesterol (Calc): 150 mg/dL (calc) — ABNORMAL HIGH (ref <130)
TRIGLYCERIDES: 187 mg/dL — AB (ref <150)
Total CHOL/HDL Ratio: 5.4 (calc) — ABNORMAL HIGH (ref <5.0)

## 2018-07-19 LAB — HEMOGLOBIN A1C
Hgb A1c MFr Bld: 6.4 % of total Hgb — ABNORMAL HIGH (ref <5.7)
Mean Plasma Glucose: 137 (calc)
eAG (mmol/L): 7.6 (calc)

## 2018-07-19 LAB — CBC WITH DIFFERENTIAL/PLATELET
Absolute Monocytes: 718 cells/uL (ref 200–950)
BASOS PCT: 1.4 %
Basophils Absolute: 88 cells/uL (ref 0–200)
Eosinophils Absolute: 447 cells/uL (ref 15–500)
Eosinophils Relative: 7.1 %
HCT: 41.5 % (ref 38.5–50.0)
Hemoglobin: 14.2 g/dL (ref 13.2–17.1)
LYMPHS ABS: 1569 {cells}/uL (ref 850–3900)
MCH: 30.3 pg (ref 27.0–33.0)
MCHC: 34.2 g/dL (ref 32.0–36.0)
MCV: 88.7 fL (ref 80.0–100.0)
MPV: 9.4 fL (ref 7.5–12.5)
Monocytes Relative: 11.4 %
Neutro Abs: 3478 cells/uL (ref 1500–7800)
Neutrophils Relative %: 55.2 %
Platelets: 367 10*3/uL (ref 140–400)
RBC: 4.68 10*6/uL (ref 4.20–5.80)
RDW: 13 % (ref 11.0–15.0)
Total Lymphocyte: 24.9 %
WBC: 6.3 10*3/uL (ref 3.8–10.8)

## 2018-07-19 LAB — COMPLETE METABOLIC PANEL WITH GFR
AG Ratio: 1.9 (calc) (ref 1.0–2.5)
ALT: 28 U/L (ref 9–46)
AST: 22 U/L (ref 10–35)
Albumin: 4.8 g/dL (ref 3.6–5.1)
Alkaline phosphatase (APISO): 48 U/L (ref 40–115)
BUN: 22 mg/dL (ref 7–25)
CHLORIDE: 101 mmol/L (ref 98–110)
CO2: 29 mmol/L (ref 20–32)
Calcium: 10.8 mg/dL — ABNORMAL HIGH (ref 8.6–10.3)
Creat: 1.03 mg/dL (ref 0.70–1.25)
GFR, EST AFRICAN AMERICAN: 88 mL/min/{1.73_m2} (ref >=60)
GFR, Est Non African American: 76 mL/min/{1.73_m2} (ref >=60)
Globulin: 2.5 g/dL (calc) (ref 1.9–3.7)
Glucose, Bld: 120 mg/dL — ABNORMAL HIGH (ref 65–99)
Potassium: 4.4 mmol/L (ref 3.5–5.3)
Sodium: 136 mmol/L (ref 135–146)
Total Bilirubin: 0.5 mg/dL (ref 0.2–1.2)
Total Protein: 7.3 g/dL (ref 6.1–8.1)

## 2018-07-19 LAB — VITAMIN D 25 HYDROXY (VIT D DEFICIENCY, FRACTURES): Vit D, 25-Hydroxy: 32 ng/mL (ref 30–100)

## 2018-07-19 LAB — TSH: TSH: 2.28 mIU/L (ref 0.40–4.50)

## 2018-07-19 MED ORDER — ROSUVASTATIN CALCIUM 40 MG PO TABS: 40.0000 mg | ORAL_TABLET | Freq: Every day | ORAL | 1 refills | Status: DC

## 2018-07-23 ENCOUNTER — Other Ambulatory Visit: Payer: Self-pay | Admitting: Adult Health

## 2018-07-23 DIAGNOSIS — I10 Essential (primary) hypertension: Secondary | ICD-10-CM

## 2018-07-26 ENCOUNTER — Encounter: Payer: Self-pay | Admitting: Adult Health

## 2018-07-26 ENCOUNTER — Ambulatory Visit (HOSPITAL_COMMUNITY)
Admission: RE | Admit: 2018-07-26 | Discharge: 2018-07-26 | Disposition: A | Discharge status: Home / Self Care | Payer: 59 | Source: Ambulatory Visit | Attending: Adult Health | Admitting: Adult Health

## 2018-07-26 DIAGNOSIS — I700 Atherosclerosis of aorta: Secondary | ICD-10-CM | POA: Insufficient documentation

## 2018-07-26 DIAGNOSIS — I10 Essential (primary) hypertension: Secondary | ICD-10-CM | POA: Diagnosis present

## 2018-08-05 ENCOUNTER — Other Ambulatory Visit: Payer: Self-pay | Admitting: Internal Medicine

## 2018-08-05 DIAGNOSIS — I10 Essential (primary) hypertension: Secondary | ICD-10-CM

## 2018-09-04 ENCOUNTER — Other Ambulatory Visit: Payer: Self-pay | Admitting: Adult Health

## 2018-09-04 DIAGNOSIS — M5136 Other intervertebral disc degeneration, lumbar region: Secondary | ICD-10-CM

## 2018-09-04 DIAGNOSIS — M5410 Radiculopathy, site unspecified: Secondary | ICD-10-CM

## 2018-09-06 ENCOUNTER — Encounter: Payer: Self-pay | Admitting: Adult Health

## 2018-10-04 ENCOUNTER — Other Ambulatory Visit: Payer: Self-pay | Admitting: Adult Health

## 2018-10-04 DIAGNOSIS — I10 Essential (primary) hypertension: Secondary | ICD-10-CM

## 2018-10-18 ENCOUNTER — Telehealth: Payer: Self-pay | Admitting: *Deleted

## 2018-10-18 NOTE — Telephone Encounter (Signed)
   Primary Cardiologist:  DR [name]   Patient contacted.  History reviewed.  No symptoms to suggest any unstable cardiac conditions.  Based on discussion, with current pandemic situation, we will be postponing this appointment for Nathaniel Gill with a plan for f/u in SEPT 14 ,2020 AT 8:20 AM or sooner if feasible/necessary.  If symptoms change, he has been instructed to contact our office.     Raiford Simmonds, RN  10/18/2018 5:57 PM         .

## 2018-10-24 ENCOUNTER — Ambulatory Visit: Payer: 59 | Admitting: Cardiology

## 2018-10-29 NOTE — Progress Notes (Signed)
FOLLOW UP  Assessment and Plan:   Atherosclerosis of aorta Control blood pressure, cholesterol, glucose, increase exercise.   Hypertension Well controlled with current medications  Monitor blood pressure at home; patient to call if consistently greater than 130/80 Continue DASH diet.   Reminder to go to the ER if any CP, SOB, nausea, dizziness, severe HA, changes vision/speech, left arm numbness and tingling and jaw pain.  Cholesterol Currently above goal; newly on rosuvastatin 40 mg daily but unsure if he has been taking  Continue low cholesterol diet and exercise.  Check lipid panel.   Prediabetes Continue diet and exercise, discussed initiating metformin for A1C 7+ Limit fruit juices and other processed carbs, aim for 7% body weight loss, low GI diet Perform daily foot/skin check, notify office of any concerning changes.  Check A1C  Overweight Long discussion about weight loss, diet, and exercise Recommended diet heavy in fruits and veggies and low in animal meats, cheeses, and dairy products, appropriate calorie intake Discussed ideal weight for height Restart exercise, portion control Will start the patient on phentermine- hand out given and AE's discussed, will do close follow up.  Will follow up in 3 months  Vitamin D Def Below goal at last visit; he will verify current supplement dose continue supplementation to maintain goal of 60-100 Check Vit D level  Continue diet and meds as discussed. Further disposition pending results of labs. Discussed med's effects and SE's.   Over 30 minutes of exam, counseling, chart review, and critical decision making was performed.   Future Appointments  Date Time Provider [place]  01/31/2019  8:45 AM Liane Comber, NP [facility] None  03/21/2019  8:20 AM Leonie Man, MD [facility]  04/17/2019  9:00 AM Liane Comber, NP [facility] None     ----------------------------------------------------------------------------------------------------------------------  HPI 68 y.o. male  presents for 3 month follow up on hypertension, cholesterol, prediabetes, weight and vitamin D deficiency.   He is following with Levy Pupa, PA at [facility] for multiple ortho complaints  he has a diagnosis of GERD which is currently managed by daily 1 table spoon of ACV and this has been particularly beneficial, and has self tapered off of ranitidine, uses tums PRN but hasn't needed except in rare occasion.  he reports symptoms is currently well controlled, and denies breakthrough reflux, burning in chest, hoarseness or cough.    BMI is Body mass index is 31.17 kg/m., he has been working on diet and exercise. Eating healthy choice meals and smaller portions, traveling a lot [facility]. He drinks mainly water, refills 12 ounce cup x 6 daily =72 ounces Wt Readings from Last 3 Encounters:  10/31/18 233 lb (105.7 kg)  07/18/18 232 lb 12.8 oz (105.6 kg)  04/24/18 232 lb (105.2 kg)   His blood pressure has been controlled at home, today their BP is BP: 118/64  He does workout. He denies chest pain, shortness of breath, dizziness.   He is on cholesterol medication (newly on rosuvastatin 40 mg daily, but he is unsure if he is taking this) and denies myalgias. His cholesterol is not at goal. The cholesterol last visit was:   Lab Results  Component Value Date   CHOL 184 07/18/2018   HDL 34 (L) 07/18/2018   LDLCALC 120 (H) 07/18/2018   TRIG 187 (H) 07/18/2018   CHOLHDL 5.4 (H) 07/18/2018    He has been working on diet and exercise for prediabetes, and denies foot ulcerations,  increased appetite, nausea, paresthesia of the feet, polydipsia, polyuria, visual disturbances, vomiting and weight loss. Last A1C in the office was:  Lab Results  Component Value Date   HGBA1C 6.4 (H) 07/18/2018   Patient is on Vitamin D  supplement.  Lab Results  Component Value Date   VD25OH 32 07/18/2018       Current Medications:  Current Outpatient Medications on File Prior to Visit  Medication Sig  . amLODipine (NORVASC) 10 MG tablet TAKE ONE TABLET BY MOUTH DAILY  . aspirin 81 MG tablet Take 81 mg by mouth daily.  . Black Pepper-Turmeric (TURMERIC COMPLEX/BLACK PEPPER PO) Take 500 mg by mouth daily.   . carvedilol (COREG) 12.5 MG tablet Take 12.5 mg by mouth 2 (two) times daily with a meal.  . Cholecalciferol (VITAMIN D3) 1.25 MG (50000 UT) CAPS Take by mouth daily.   Marland Kitchen lisinopril-hydrochlorothiazide (PRINZIDE,ZESTORETIC) 20-12.5 MG tablet TAKE ONE TABLET BY MOUTH DAILY  . minoxidil (LONITEN) 10 MG tablet TAKE ONE-HALF TABLET BY MOUTH DAILY AT NIGHT  . rosuvastatin (CRESTOR) 40 MG tablet Take 1 tablet (40 mg total) by mouth daily.  . sildenafil (VIAGRA) 100 MG tablet Take 1/4-1/2 tab as needed.  . vitamin C (ASCORBIC ACID) 500 MG tablet Take 500 mg by mouth daily.  . Zinc 50 MG TABS Take by mouth daily.  . ranitidine (ZANTAC) 150 MG tablet TAKE ONE TABLET BY MOUTH TWICE A DAY (Patient taking differently: as needed. )   No current facility-administered medications on file prior to visit.      Allergies: No Known Allergies   Medical History:  Past Medical History:  Diagnosis Date  . Hyperlipidemia   . Hypertension    No sign of renal artery stenosis  . Vertigo    Family history- Reviewed and unchanged Social history- Reviewed and unchanged   Review of Systems:  Review of Systems  Constitutional: Negative for malaise/fatigue and weight loss.  HENT: Negative for hearing loss and tinnitus.   Eyes: Negative for blurred vision and double vision.  Respiratory: Negative for cough, shortness of breath and wheezing.   Cardiovascular: Negative for chest pain, palpitations, orthopnea, claudication and leg swelling.  Gastrointestinal: Negative for abdominal pain, blood in stool, constipation, diarrhea,  heartburn, melena, nausea and vomiting.  Genitourinary: Negative.   Musculoskeletal: Negative for joint pain and myalgias.  Skin: Negative for rash.  Neurological: Negative for dizziness, tingling, sensory change, weakness and headaches.  Endo/Heme/Allergies: Negative for polydipsia.  Psychiatric/Behavioral: Negative.   All other systems reviewed and are negative.   Physical Exam: BP 118/64   Pulse 75   Temp 97.9 F (36.6 C)   Ht 6' 0.5" (1.842 m)   Wt 233 lb (105.7 kg)   SpO2 97%   BMI 31.17 kg/m  Wt Readings from Last 3 Encounters:  10/31/18 233 lb (105.7 kg)  07/18/18 232 lb 12.8 oz (105.6 kg)  04/24/18 232 lb (105.2 kg)   General Appearance: Well nourished, in no apparent distress. Eyes: PERRLA, EOMs, conjunctiva no swelling or erythema Sinuses: No Frontal/maxillary tenderness ENT/Mouth: Ext aud canals clear, TMs without erythema, bulging. No erythema, swelling, or exudate on post pharynx.  Tonsils not swollen or erythematous. Hearing normal.  Neck: Supple, thyroid normal.  Respiratory: Respiratory effort normal, BS equal bilaterally without rales, rhonchi, wheezing or stridor.  Cardio: RRR with no MRGs. Brisk peripheral pulses without edema.  Abdomen: Soft, + BS.  Non tender, no guarding, rebound, hernias, masses. Lymphatics: Non tender without lymphadenopathy.  Musculoskeletal: Full ROM,  5/5 strength, Normal gait Skin: Warm, dry without rashes, lesions, ecchymosis.  Neuro: Cranial nerves intact. No cerebellar symptoms.  Psych: Awake and oriented X 3, normal affect, Insight and Judgment appropriate.    Izora Ribas, NP 8:59 AM [facility]

## 2018-10-31 ENCOUNTER — Other Ambulatory Visit: Payer: Self-pay

## 2018-10-31 ENCOUNTER — Encounter: Payer: Self-pay | Admitting: Adult Health

## 2018-10-31 ENCOUNTER — Ambulatory Visit: Payer: 59 | Admitting: Adult Health

## 2018-10-31 VITALS — BP 118/64 | HR 75 | Temp 97.9°F | Ht 72.5 in | Wt 233.0 lb

## 2018-10-31 DIAGNOSIS — R7303 Prediabetes: Secondary | ICD-10-CM

## 2018-10-31 DIAGNOSIS — Z79899 Other long term (current) drug therapy: Secondary | ICD-10-CM

## 2018-10-31 DIAGNOSIS — E559 Vitamin D deficiency, unspecified: Secondary | ICD-10-CM | POA: Diagnosis not present

## 2018-10-31 DIAGNOSIS — E663 Overweight: Secondary | ICD-10-CM

## 2018-10-31 DIAGNOSIS — K219 Gastro-esophageal reflux disease without esophagitis: Secondary | ICD-10-CM

## 2018-10-31 DIAGNOSIS — E785 Hyperlipidemia, unspecified: Secondary | ICD-10-CM

## 2018-10-31 DIAGNOSIS — I700 Atherosclerosis of aorta: Secondary | ICD-10-CM

## 2018-10-31 DIAGNOSIS — I10 Essential (primary) hypertension: Secondary | ICD-10-CM

## 2018-10-31 MED ORDER — PHENTERMINE HCL 37.5 MG PO TABS: ORAL_TABLET | ORAL | 2 refills | Status: DC

## 2018-10-31 NOTE — Patient Instructions (Signed)
Goals    . Exercise 150 min/wk Moderate Activity    . LDL CALC < 100    . Weight (lb) < 220 lb (99.8 kg)        Drink 1/2 your body weight in fluid ounces of water daily; drink a tall glass of water 30 min before meals  Don't eat until you're stuffed- listen to your stomach and eat until you are 80% full   Try eating off of a salad plate; wait 10 min after finishing before going back for seconds  Start by eating the vegetables on your plate; aim for 50% of your meals to be fruits or vegetables  Then eat your protein - lean meats (grass fed if possible), fish, beans, nuts in moderation  Eat your carbs/starch last ONLY if you still are hungry. If you can, stop before finishing it all  Avoid sugar and flour - the closer it looks to it's original form in nature, typically the better it is for you  Splurge in moderation - "assign" days when you get to splurge and have the "bad stuff" - I like to follow a 80% - 20% plan- "good" choices 80 % of the time, "bad" choices in moderation 20% of the time  Simple equation is: Calories out > calories in = weight loss - even if you eat the bad stuff, if you limit portions, you will still lose weight       When it comes to diets, agreement about the perfect plan isn't easy to find, even among the experts. Experts at the [employer] developed an idea known as the Healthy Eating Plate. Just imagine a plate divided into logical, healthy portions.  The emphasis is on diet quality:  Load up on vegetables and fruits - one-half of your plate: Aim for color and variety, and remember that potatoes don't count.  Go for whole grains - one-quarter of your plate: Whole wheat, barley, wheat berries, quinoa, oats, brown rice, and foods made with them. If you want pasta, go with whole wheat pasta.  Protein power - one-quarter of your plate: Fish, chicken, beans, and nuts are all healthy, versatile protein sources. Limit red meat.  The  diet, however, does go beyond the plate, offering a few other suggestions.  Use healthy plant oils, such as olive, canola, soy, corn, sunflower and peanut. Check the labels, and avoid partially hydrogenated oil, which have unhealthy trans fats.  If you're thirsty, drink water. Coffee and tea are good in moderation, but skip sugary drinks and limit milk and dairy products to one or two daily servings.  The type of carbohydrate in the diet is more important than the amount. Some sources of carbohydrates, such as vegetables, fruits, whole grains, and beans-are healthier than others.  Finally, stay active.      Phentermine tablets or capsules What is this medicine? PHENTERMINE (FEN ter meen) decreases your appetite. It is used with a reduced calorie diet and exercise to help you lose weight. This medicine may be used for other purposes; ask your health care provider or pharmacist if you have questions. COMMON BRAND NAME(S): Adipex-P, Atti-Plex P, Atti-Plex P Spansule, Fastin, Lomaira, Pro-Fast, Tara-8 What should I tell my health care provider before I take this medicine? They need to know if you have any of these conditions: -agitation or nervousness -diabetes -glaucoma -heart disease -high blood pressure -history of drug abuse or addiction -history of stroke -kidney disease -lung disease called Primary Pulmonary Hypertension (  PPH) -taken an MAOI like Carbex, Eldepryl, Marplan, Nardil, or Parnate in last 14 days -taking stimulant medicines for attention disorders, weight loss, or to stay awake -thyroid disease -an unusual or allergic reaction to phentermine, other medicines, foods, dyes, or preservatives -pregnant or trying to get pregnant -breast-feeding How should I use this medicine? Take this medicine by mouth with a glass of water. Follow the directions on the prescription label. The instructions for use may differ based on the product and dose you are taking. Avoid taking this  medicine in the evening. It may interfere with sleep. Take your doses at regular intervals. Do not take your medicine more often than directed. Talk to your pediatrician regarding the use of this medicine in children. While this drug may be prescribed for children 17 years or older for selected conditions, precautions do apply. Overdosage: If you think you have taken too much of this medicine contact a poison control center or emergency room at once. NOTE: This medicine is only for you. Do not share this medicine with others. What if I miss a dose? If you miss a dose, take it as soon as you can. If it is almost time for your next dose, take only that dose. Do not take double or extra doses. What may interact with this medicine? Do not take this medicine with any of the following medications: -MAOIs like Carbex, Eldepryl, Marplan, Nardil, and Parnate -medicines for colds or breathing difficulties like pseudoephedrine or phenylephrine -procarbazine -sibutramine -stimulant medicines for attention disorders, weight loss, or to stay awake This medicine may also interact with the following medications: -certain medicines for depression, anxiety, or psychotic disturbances -linezolid -medicines for diabetes -medicines for high blood pressure This list may not describe all possible interactions. Give your health care provider a list of all the medicines, herbs, non-prescription drugs, or dietary supplements you use. Also tell them if you smoke, drink alcohol, or use illegal drugs. Some items may interact with your medicine. What should I watch for while using this medicine? Notify your physician immediately if you become short of breath while doing your normal activities. Do not take this medicine within 6 hours of bedtime. It can keep you from getting to sleep. Avoid drinks that contain caffeine and try to stick to a regular bedtime every night. This medicine was intended to be used in addition to a  healthy diet and exercise. The best results are achieved this way. This medicine is only indicated for short-term use. Eventually your weight loss may level out. At that point, the drug will only help you maintain your new weight. Do not increase or in any way change your dose without consulting your doctor. You may get drowsy or dizzy. Do not drive, use machinery, or do anything that needs mental alertness until you know how this medicine affects you. Do not stand or sit up quickly, especially if you are an older patient. This reduces the risk of dizzy or fainting spells. Alcohol may increase dizziness and drowsiness. Avoid alcoholic drinks. What side effects may I notice from receiving this medicine? Side effects that you should report to your doctor or health care professional as soon as possible: -allergic reactions like skin rash, itching or hives, swelling of the face, lips, or tongue) -anxiety -breathing problems -changes in vision -chest pain or chest tightness -depressed mood or other mood changes -hallucinations, loss of contact with reality -fast, irregular heartbeat -increased blood pressure -irritable -nervousness or restlessness -painful urination -palpitations -tremors -trouble sleeping -  seizures -signs and symptoms of a stroke like changes in vision; confusion; trouble speaking or understanding; severe headaches; sudden numbness or weakness of the face, arm or leg; trouble walking; dizziness; loss of balance or coordination -unusually weak or tired -vomiting Side effects that usually do not require medical attention (report to your doctor or health care professional if they continue or are bothersome): -constipation or diarrhea -dry mouth -headache -nausea -stomach upset -sweating This list may not describe all possible side effects. Call your doctor for medical advice about side effects. You may report side effects to FDA at 1-800-FDA-1088. Where should I keep my  medicine? Keep out of the reach of children. This medicine can be abused. Keep your medicine in a safe place to protect it from theft. Do not share this medicine with anyone. Selling or giving away this medicine is dangerous and against the law. This medicine may cause accidental overdose and death if taken by other adults, children, or pets. Mix any unused medicine with a substance like cat litter or coffee grounds. Then throw the medicine away in a sealed container like a sealed bag or a coffee can with a lid. Do not use the medicine after the expiration date. Store at room temperature between 20 and 25 degrees C (68 and 77 degrees F). Keep container tightly closed. NOTE: This sheet is a summary. It may not cover all possible information. If you have questions about this medicine, talk to your doctor, pharmacist, or health care provider.  2019 [employer]vier/[employer] Standard (2016-12-02 08:23:13)

## 2018-11-01 LAB — CBC WITH DIFFERENTIAL/PLATELET
Absolute Monocytes: 724 cells/uL (ref 200–950)
Basophils Absolute: 103 cells/uL (ref 0–200)
Basophils Relative: 1.9 %
Eosinophils Absolute: 329 cells/uL (ref 15–500)
Eosinophils Relative: 6.1 %
HCT: 40.8 % (ref 38.5–50.0)
Hemoglobin: 13.9 g/dL (ref 13.2–17.1)
Lymphs Abs: 1372 cells/uL (ref 850–3900)
MCH: 30.6 pg (ref 27.0–33.0)
MCHC: 34.1 g/dL (ref 32.0–36.0)
MCV: 89.9 fL (ref 80.0–100.0)
MPV: 9.3 fL (ref 7.5–12.5)
Monocytes Relative: 13.4 %
Neutro Abs: 2873 cells/uL (ref 1500–7800)
Neutrophils Relative %: 53.2 %
Platelets: 307 10*3/uL (ref 140–400)
RBC: 4.54 10*6/uL (ref 4.20–5.80)
RDW: 13.1 % (ref 11.0–15.0)
Total Lymphocyte: 25.4 %
WBC: 5.4 10*3/uL (ref 3.8–10.8)

## 2018-11-01 LAB — MAGNESIUM: Magnesium: 2 mg/dL (ref 1.5–2.5)

## 2018-11-01 LAB — COMPLETE METABOLIC PANEL WITH GFR
AG Ratio: 2.1 (calc) (ref 1.0–2.5)
ALT: 29 U/L (ref 9–46)
AST: 26 U/L (ref 10–35)
Albumin: 4.6 g/dL (ref 3.6–5.1)
Alkaline phosphatase (APISO): 33 U/L — ABNORMAL LOW (ref 35–144)
BUN: 17 mg/dL (ref 7–25)
CO2: 29 mmol/L (ref 20–32)
Calcium: 10.2 mg/dL (ref 8.6–10.3)
Chloride: 102 mmol/L (ref 98–110)
Creat: 1.03 mg/dL (ref 0.70–1.25)
GFR, Est African American: 87 mL/min/{1.73_m2} (ref >=60)
GFR, Est Non African American: 75 mL/min/{1.73_m2} (ref >=60)
Globulin: 2.2 g/dL (calc) (ref 1.9–3.7)
Glucose, Bld: 120 mg/dL — ABNORMAL HIGH (ref 65–99)
Potassium: 4.4 mmol/L (ref 3.5–5.3)
Sodium: 138 mmol/L (ref 135–146)
Total Bilirubin: 0.4 mg/dL (ref 0.2–1.2)
Total Protein: 6.8 g/dL (ref 6.1–8.1)

## 2018-11-01 LAB — LIPID PANEL
Cholesterol: 145 mg/dL (ref <200)
HDL: 43 mg/dL (ref >=40)
LDL Cholesterol (Calc): 81 mg/dL (calc)
Non-HDL Cholesterol (Calc): 102 mg/dL (calc) (ref <130)
Total CHOL/HDL Ratio: 3.4 (calc) (ref <5.0)
Triglycerides: 113 mg/dL (ref <150)

## 2018-11-01 LAB — HEMOGLOBIN A1C
Hgb A1c MFr Bld: 6.5 % of total Hgb — ABNORMAL HIGH (ref <5.7)
Mean Plasma Glucose: 140 (calc)
eAG (mmol/L): 7.7 (calc)

## 2018-11-01 LAB — TSH: TSH: 2.84 mIU/L (ref 0.40–4.50)

## 2018-11-03 ENCOUNTER — Other Ambulatory Visit: Payer: Self-pay

## 2018-11-03 DIAGNOSIS — I10 Essential (primary) hypertension: Secondary | ICD-10-CM

## 2018-11-04 MED ORDER — LISINOPRIL-HYDROCHLOROTHIAZIDE 20-12.5 MG PO TABS: ORAL_TABLET | ORAL | 0 refills | Status: DC

## 2018-11-04 MED ORDER — AMLODIPINE BESYLATE 10 MG PO TABS: ORAL_TABLET | ORAL | 1 refills | Status: DC

## 2018-12-04 NOTE — Telephone Encounter (Signed)
Contacted patient regarding his mychart message due to chest pain-  He states that for the past few months he has noticed when he lays down on his left side at night he will get left sided chest pains, only today he has been sitting in his chair and noticed the sharp pain only on the left side of his chest that last less than 5 minutes. He denies arm pain, neck or back pain, no SOB, or swelling, and no diet changes. He does mention starting a new medication Phentermine in April no other med changes than this.   His BP has been normal- although he was unable to tell me numbers, he has lost weight about 10-12 lbs, and he states he does not feel bad, he just wants Dr.Harding to be made aware of if he should be doing anything different.  I did advise I would route the message to MD and nurse for any other suggestions- advised if severe chest pain came and stayed to go be evaluated- suggested to hydrate with water- and to monitor his BP and HR if he had these episodes again.   Patient verbalized understanding.

## 2018-12-05 NOTE — Telephone Encounter (Signed)
My Chart message Tree  Not unreasonable to discuss & check out -- will forward to my RN to see if we can set up an appt.  Probably not overly worrisome unless occurs/worse with exertion.  DH ===View-only below this line===   ----- Message -----    From: [name]    Sent: 12/04/2018  8:47 AM EDT      To: [name], MD Subject: Non-Urgent Medical Question  Dr. Ellyn Hack, I don't know if this needs to be looked at or not. Recently, I have had shooting pains in the left side of my chest when I sleep on my left side. Today, while I was sitting at my desk, these same shooting pains started coming and going. I wonder if I should get this checked out?  I also sent the same message to my family doctor, but didn't know if you would prefer seeing me. Your thoughts? Nathaniel Gill

## 2019-01-02 ENCOUNTER — Other Ambulatory Visit: Payer: Self-pay | Admitting: Cardiology

## 2019-01-02 ENCOUNTER — Other Ambulatory Visit: Payer: Self-pay | Admitting: Adult Health

## 2019-01-31 ENCOUNTER — Ambulatory Visit: Payer: Self-pay | Admitting: Adult Health

## 2019-02-13 DIAGNOSIS — E1122 Type 2 diabetes mellitus with diabetic chronic kidney disease: Secondary | ICD-10-CM | POA: Insufficient documentation

## 2019-02-13 DIAGNOSIS — N182 Chronic kidney disease, stage 2 (mild): Secondary | ICD-10-CM | POA: Insufficient documentation

## 2019-02-13 NOTE — Progress Notes (Signed)
FOLLOW UP  Assessment and Plan:   Atherosclerosis of aorta Control blood pressure, cholesterol, glucose, increase exercise.   Hypertension Well controlled with current medications  Monitor blood pressure at home; patient to call if consistently greater than 130/80 Continue DASH diet.   Reminder to go to the ER if any CP, SOB, nausea, dizziness, severe HA, changes vision/speech, left arm numbness and tingling and jaw pain.  Cholesterol Currently above goal; newly on rosuvastatin 40 mg daily but unsure if he has been taking  Continue low cholesterol diet and exercise.  Check lipid panel.   Prediabetes/ diet controlled T2 diabetes Continue diet and exercise, discussed initiating metformin for A1C 7+ Limit fruit juices and other processed carbs, aim for 7% body weight loss, low GI diet Perform daily foot/skin check, notify office of any concerning changes.  Check A1C  Overweight Long discussion about weight loss, diet, and exercise Recommended diet heavy in fruits and veggies and low in animal meats, cheeses, and dairy products, appropriate calorie intake Discussed ideal weight for height Restart exercise, portion control Will start the patient on phentermine- hand out given and AE's discussed, will do close follow up.  Will follow up in 3 months  Vitamin D Def Below goal at last visit; he will increase to 8000 IU daily continue supplementation to maintain goal of 60-100 Check Vit D level at CPE  Continue diet and meds as discussed. Further disposition pending results of labs. Discussed med's effects and SE's.   Over 30 minutes of exam, counseling, chart review, and critical decision making was performed.   Future Appointments  Date Time Provider [place]  03/21/2019  8:20 AM Leonie Man, MD [facility] [place]  04/17/2019  9:00 AM Liane Comber, NP [facility] None     ----------------------------------------------------------------------------------------------------------------------  HPI 68 y.o. male  presents for 3 month follow up on hypertension, cholesterol, prediabetes, weight and vitamin D deficiency.   he has a diagnosis of GERD which is currently managed by daily 1 table spoon of ACV and this has been particularly beneficial, and has self tapered off of ranitidine. Denies breakthrough symptoms.  he is prescribed phentermine for weight loss, taking 1/2 tab daily.  While on the medication they have lost 3 lbs since last visit. They deny palpitations, anxiety, trouble sleeping, elevated BP.   BMI is Body mass index is 30.76 kg/m., he is working on diet and exercise. Eating healthy choice meals and smaller portions, traveling a lot [facility]. He drinks mainly water, refills 12 ounce cup x 6 daily =72 ounces. He reports on home scale down from 238 down to 219.8 lb at lowest, most recently 225 lb, tracking weight, home scale body fat reading is 17-18%.  He is doing his best with exercise at home, weights; waist size down to 36.  Wt Readings from Last 3 Encounters:  02/14/19 230 lb (104.3 kg)  10/31/18 233 lb (105.7 kg)  07/18/18 232 lb 12.8 oz (105.6 kg)   He is followed by Dr. 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  He had essentially normal ECHO in 07/2017 obtained for murmur evaluation; attributed to mild septal thickening.   His blood pressure has been controlled at home, today their BP is BP: 120/72  He does workout. He denies chest pain, shortness of breath, dizziness.   He is on cholesterol medication (rosuvastatin 40 mg daily) and denies myalgias. His cholesterol is not at goal. The cholesterol last visit was:  Lab Results  Component Value Date   CHOL 145 10/31/2018   HDL 43 10/31/2018   LDLCALC 81 10/31/2018   TRIG 113 10/31/2018   CHOLHDL 3.4 10/31/2018    He has been  working on diet and exercise for prediabetes/borderline diabetes, and denies foot ulcerations, increased appetite, nausea, paresthesia of the feet, polydipsia, polyuria, visual disturbances, vomiting and weight loss. Last A1C in the office was:  Lab Results  Component Value Date   HGBA1C 6.5 (H) 10/31/2018   Patient is on Vitamin D supplement, taking 4000 IU daily, didn't increase previously due to calcium, now resolved.  Lab Results  Component Value Date   VD25OH 32 07/18/2018       Current Medications:  Current Outpatient Medications on File Prior to Visit  Medication Sig  . amLODipine (NORVASC) 10 MG tablet Take 1 tablet Daily for BP  . aspirin 81 MG tablet Take 81 mg by mouth daily.  . Black Pepper-Turmeric (TURMERIC COMPLEX/BLACK PEPPER PO) Take 500 mg by mouth daily.   . carvedilol (COREG) 12.5 MG tablet TAKE ONE TABLET BY MOUTH TWICE A DAY  . CINNAMON PO Take 500 mg by mouth 2 (two) times daily.  Marland Kitchen lisinopril-hydrochlorothiazide (ZESTORETIC) 20-12.5 MG tablet Take 1 tablet Daily for BP  . minoxidil (LONITEN) 10 MG tablet TAKE ONE-HALF TABLET BY MOUTH DAILY AT NIGHT  . phentermine (ADIPEX-P) 37.5 MG tablet Take 1/2 to 1 tablet every morning for dieting & weightloss  . rosuvastatin (CRESTOR) 40 MG tablet TAKE 1 TABLET BY MOUTH DAILY  . sildenafil (VIAGRA) 100 MG tablet Take 1/4-1/2 tab as needed.  . vitamin C (ASCORBIC ACID) 500 MG tablet Take 500 mg by mouth daily.  . Zinc 50 MG TABS Take by mouth daily.  . Cholecalciferol 50 MCG (2000 UT) CAPS Take 4,000 Units by mouth daily.   No current facility-administered medications on file prior to visit.      Allergies: No Known Allergies   Medical History:  Past Medical History:  Diagnosis Date  . Hyperlipidemia   . Hypertension    No sign of renal artery stenosis  . Vertigo    Family history- Reviewed and unchanged Social history- Reviewed and unchanged   Review of Systems:  Review of Systems  Constitutional: Negative  for malaise/fatigue and weight loss.  HENT: Negative for hearing loss and tinnitus.   Eyes: Negative for blurred vision and double vision.  Respiratory: Negative for cough, shortness of breath and wheezing.   Cardiovascular: Negative for chest pain, palpitations, orthopnea, claudication and leg swelling.  Gastrointestinal: Negative for abdominal pain, blood in stool, constipation, diarrhea, heartburn, melena, nausea and vomiting.  Genitourinary: Negative.   Musculoskeletal: Negative for joint pain and myalgias.  Skin: Negative for rash.  Neurological: Negative for dizziness, tingling, sensory change, weakness and headaches.  Endo/Heme/Allergies: Negative for polydipsia.  Psychiatric/Behavioral: Negative.   All other systems reviewed and are negative.   Physical Exam: BP 120/72   Pulse 73   Temp (!) 97 F (36.1 C)   Wt 230 lb (104.3 kg)   SpO2 97%   BMI 30.76 kg/m  Wt Readings from Last 3 Encounters:  02/14/19 230 lb (104.3 kg)  10/31/18 233 lb (105.7 kg)  07/18/18 232 lb 12.8 oz (105.6 kg)   General Appearance: Well nourished, in no apparent distress. Eyes: PERRLA, EOMs, conjunctiva no swelling or erythema Sinuses: No Frontal/maxillary tenderness ENT/Mouth: Ext aud canals clear, TMs without erythema, bulging. No erythema, swelling, or exudate on post pharynx.  Tonsils not  swollen or erythematous. Hearing normal.  Neck: Supple, thyroid normal.  Respiratory: Respiratory effort normal, BS equal bilaterally without rales, rhonchi, wheezing or stridor.  Cardio: RRR with no MRGs. Brisk peripheral pulses without edema.  Abdomen: Soft, + BS.  Non tender, no guarding, rebound, hernias, masses. Lymphatics: Non tender without lymphadenopathy.  Musculoskeletal: Full ROM, 5/5 strength, Normal gait Skin: Warm, dry without rashes, lesions, ecchymosis.  Neuro: Cranial nerves intact. No cerebellar symptoms.  Psych: Awake and oriented X 3, normal affect, Insight and Judgment appropriate.     Izora Ribas, NP 9:57 AM [facility]

## 2019-02-14 ENCOUNTER — Encounter: Payer: Self-pay | Admitting: Adult Health

## 2019-02-14 ENCOUNTER — Ambulatory Visit (INDEPENDENT_AMBULATORY_CARE_PROVIDER_SITE_OTHER): Payer: 59 | Admitting: Adult Health

## 2019-02-14 ENCOUNTER — Other Ambulatory Visit: Payer: Self-pay

## 2019-02-14 VITALS — BP 120/72 | HR 73 | Temp 97.0°F | Wt 230.0 lb

## 2019-02-14 DIAGNOSIS — E1122 Type 2 diabetes mellitus with diabetic chronic kidney disease: Secondary | ICD-10-CM

## 2019-02-14 DIAGNOSIS — E1169 Type 2 diabetes mellitus with other specified complication: Secondary | ICD-10-CM | POA: Diagnosis not present

## 2019-02-14 DIAGNOSIS — I10 Essential (primary) hypertension: Secondary | ICD-10-CM | POA: Diagnosis not present

## 2019-02-14 DIAGNOSIS — I700 Atherosclerosis of aorta: Secondary | ICD-10-CM

## 2019-02-14 DIAGNOSIS — E559 Vitamin D deficiency, unspecified: Secondary | ICD-10-CM

## 2019-02-14 DIAGNOSIS — E785 Hyperlipidemia, unspecified: Secondary | ICD-10-CM

## 2019-02-14 DIAGNOSIS — Z79899 Other long term (current) drug therapy: Secondary | ICD-10-CM

## 2019-02-14 DIAGNOSIS — I1A0 Resistant hypertension: Secondary | ICD-10-CM

## 2019-02-14 DIAGNOSIS — N182 Chronic kidney disease, stage 2 (mild): Secondary | ICD-10-CM

## 2019-02-14 DIAGNOSIS — E663 Overweight: Secondary | ICD-10-CM

## 2019-02-14 DIAGNOSIS — E119 Type 2 diabetes mellitus without complications: Secondary | ICD-10-CM | POA: Diagnosis not present

## 2019-02-14 NOTE — Patient Instructions (Addendum)
Goals    . Exercise 150 min/wk Moderate Activity    . LDL CALC < 100    . Weight (lb) < 220 lb (99.8 kg)        Know what a healthy weight is for you (roughly BMI <25) and aim to maintain this  Aim for 7+ servings of fruits and vegetables daily  65-80+ fluid ounces of water or unsweet tea for healthy kidneys  Limit to max 1 drink of alcohol per day; avoid smoking/tobacco  Limit animal fats in diet for cholesterol and heart health - choose grass fed whenever available  Avoid highly processed foods, and foods high in saturated/trans fats  Aim for low stress - take time to unwind and care for your mental health  Aim for 150 min of moderate intensity exercise weekly for heart health, and weights twice weekly for bone health  Aim for 7-9 hours of sleep daily      Preventing Type 2 Diabetes Mellitus Type 2 diabetes (type 2 diabetes mellitus) is a long-term (chronic) disease that affects blood sugar (glucose) levels. Normally, a hormone called insulin allows glucose to enter cells in the body. The cells use glucose for energy. In type 2 diabetes, one or both of these problems may be present:  The body does not make enough insulin.  The body does not respond properly to insulin that it makes (insulin resistance). Insulin resistance or lack of insulin causes excess glucose to build up in the blood instead of going into cells. As a result, high blood glucose (hyperglycemia) develops, which can cause many complications. Being overweight or obese and having an inactive (sedentary) lifestyle can increase your risk for diabetes. Type 2 diabetes can be delayed or prevented by making certain nutrition and lifestyle changes. What nutrition changes can be made?   Eat healthy meals and snacks regularly. Keep a healthy snack with you for when you get hungry between meals, such as fruit or a handful of nuts.  Eat lean meats and proteins that are low in saturated fats, such as chicken, fish, egg  whites, and beans. Avoid processed meats.  Eat plenty of fruits and vegetables and plenty of grains that have not been processed (whole grains). It is recommended that you eat: ? 1?2 cups of fruit every day. ? 2?3 cups of vegetables every day. ? 6?8 oz of whole grains every day, such as oats, whole wheat, bulgur, brown rice, quinoa, and millet.  Eat low-fat dairy products, such as milk, yogurt, and cheese.  Eat foods that contain healthy fats, such as nuts, avocado, olive oil, and canola oil.  Drink water throughout the day. Avoid drinks that contain added sugar, such as soda or sweet tea.  Follow instructions from your health care provider about specific eating or drinking restrictions.  Control how much food you eat at a time (portion size). ? Check food labels to find out the serving sizes of foods. ? Use a kitchen scale to weigh amounts of foods.  Saute or steam food instead of frying it. Cook with water or broth instead of oils or butter.  Limit your intake of: ? Salt (sodium). Have no more than 1 tsp (2,400 mg) of sodium a day. If you have heart disease or high blood pressure, have less than ? tsp (1,500 mg) of sodium a day. ? Saturated fat. This is fat that is solid at room temperature, such as butter or fat on meat. What lifestyle changes can be made? Activity  Do moderate-intensity physical activity for at least 30 minutes on at least 5 days of the week, or as much as told by your health care provider.  Ask your health care provider what activities are safe for you. A mix of physical activities may be best, such as walking, swimming, cycling, and strength training.  Try to add physical activity into your day. For example: ? Park in spots that are farther away than usual, so that you walk more. For example, park in a far corner of the parking lot when you go to the office or the grocery store. ? Take a walk during your lunch break. ? Use stairs instead of elevators or  escalators. Weight Loss  Lose weight as directed. Your health care provider can determine how much weight loss is best for you and can help you lose weight safely.  If you are overweight or obese, you may be instructed to lose at least 5?7 % of your body weight. Alcohol and Tobacco   Limit alcohol intake to no more than 1 drink a day for nonpregnant women and 2 drinks a day for men. One drink equals 12 oz of beer, 5 oz of wine, or 1 oz of hard liquor.  Do not use any tobacco products, such as cigarettes, chewing tobacco, and e-cigarettes. If you need help quitting, ask your health care provider. Work With [place] Provider  Have your blood glucose tested regularly, as told by your health care provider.  Discuss your risk factors and how you can reduce your risk for diabetes.  Get screening tests as told by your health care provider. You may have screening tests regularly, especially if you have certain risk factors for type 2 diabetes.  Make an appointment with a diet and nutrition specialist (registered dietitian). A registered dietitian can help you make a healthy eating plan and can help you understand portion sizes and food labels. Why are these changes important?  It is possible to prevent or delay type 2 diabetes and related health problems by making lifestyle and nutrition changes.  It can be difficult to recognize signs of type 2 diabetes. The best way to avoid possible damage to your body is to take actions to prevent the disease before you develop symptoms. What can happen if changes are not made?  Your blood glucose levels may keep increasing. Having high blood glucose for a long time is dangerous. Too much glucose in your blood can damage your blood vessels, heart, kidneys, nerves, and eyes.  You may develop prediabetes or type 2 diabetes. Type 2 diabetes can lead to many chronic health problems and complications, such as: ? Heart  disease. ? Stroke. ? Blindness. ? Kidney disease. ? Depression. ? Poor circulation in the feet and legs, which could lead to surgical removal (amputation) in severe cases. Where to find support  Ask your health care provider to recommend a registered dietitian, diabetes educator, or weight loss program.  Look for local or online weight loss groups.  Join a gym, fitness club, or outdoor activity group, such as a walking club. Where to find more information To learn more about diabetes and diabetes prevention, visit:  American Diabetes Association (ADA): www.diabetes.[employer] of Diabetes and Digestive and Kidney Diseases: FindSpin.nl To learn more about healthy eating, visit:  The U.S. Department of Agriculture Scientist, research (physical sciences)), Choose My Plate: http://wiley-williams.com/  Office of Disease Prevention and Health Promotion (ODPHP), Dietary Guidelines: SurferLive.at Summary  You can reduce your risk for  type 2 diabetes by increasing your physical activity, eating healthy foods, and losing weight as directed.  Talk with your health care provider about your risk for type 2 diabetes. Ask about any blood tests or screening tests that you need to have. This information is not intended to replace advice given to you by your health care provider. Make sure you discuss any questions you have with your health care provider. Document Released: 10/12/2015 Document Revised: 10/12/2018 Document Reviewed: 08/11/2015 Elsevier Patient Education  2020 [employer].

## 2019-02-15 ENCOUNTER — Other Ambulatory Visit: Payer: Self-pay | Admitting: Adult Health

## 2019-02-15 DIAGNOSIS — I10 Essential (primary) hypertension: Secondary | ICD-10-CM

## 2019-02-15 LAB — LIPID PANEL
Cholesterol: 121 mg/dL (ref <200)
HDL: 43 mg/dL (ref >=40)
LDL Cholesterol (Calc): 62 mg/dL (calc)
Non-HDL Cholesterol (Calc): 78 mg/dL (calc) (ref <130)
Total CHOL/HDL Ratio: 2.8 (calc) (ref <5.0)
Triglycerides: 76 mg/dL (ref <150)

## 2019-02-15 LAB — CBC WITH DIFFERENTIAL/PLATELET
Absolute Monocytes: 681 cells/uL (ref 200–950)
Basophils Absolute: 68 cells/uL (ref 0–200)
Basophils Relative: 1.3 %
Eosinophils Absolute: 380 cells/uL (ref 15–500)
Eosinophils Relative: 7.3 %
HCT: 39.5 % (ref 38.5–50.0)
Hemoglobin: 13.2 g/dL (ref 13.2–17.1)
Lymphs Abs: 1357 cells/uL (ref 850–3900)
MCH: 29.6 pg (ref 27.0–33.0)
MCHC: 33.4 g/dL (ref 32.0–36.0)
MCV: 88.6 fL (ref 80.0–100.0)
MPV: 9.5 fL (ref 7.5–12.5)
Monocytes Relative: 13.1 %
Neutro Abs: 2714 cells/uL (ref 1500–7800)
Neutrophils Relative %: 52.2 %
Platelets: 301 10*3/uL (ref 140–400)
RBC: 4.46 10*6/uL (ref 4.20–5.80)
RDW: 12.9 % (ref 11.0–15.0)
Total Lymphocyte: 26.1 %
WBC: 5.2 10*3/uL (ref 3.8–10.8)

## 2019-02-15 LAB — HEMOGLOBIN A1C
Hgb A1c MFr Bld: 6.6 % of total Hgb — ABNORMAL HIGH (ref <5.7)
Mean Plasma Glucose: 143 (calc)
eAG (mmol/L): 7.9 (calc)

## 2019-02-15 LAB — COMPLETE METABOLIC PANEL WITH GFR
AG Ratio: 2.1 (calc) (ref 1.0–2.5)
ALT: 29 U/L (ref 9–46)
AST: 22 U/L (ref 10–35)
Albumin: 4.5 g/dL (ref 3.6–5.1)
Alkaline phosphatase (APISO): 36 U/L (ref 35–144)
BUN: 17 mg/dL (ref 7–25)
CO2: 28 mmol/L (ref 20–32)
Calcium: 10.4 mg/dL — ABNORMAL HIGH (ref 8.6–10.3)
Chloride: 104 mmol/L (ref 98–110)
Creat: 0.95 mg/dL (ref 0.70–1.25)
GFR, Est African American: 96 mL/min/{1.73_m2} (ref >=60)
GFR, Est Non African American: 83 mL/min/{1.73_m2} (ref >=60)
Globulin: 2.1 g/dL (calc) (ref 1.9–3.7)
Glucose, Bld: 125 mg/dL — ABNORMAL HIGH (ref 65–99)
Potassium: 4.3 mmol/L (ref 3.5–5.3)
Sodium: 138 mmol/L (ref 135–146)
Total Bilirubin: 0.5 mg/dL (ref 0.2–1.2)
Total Protein: 6.6 g/dL (ref 6.1–8.1)

## 2019-02-15 LAB — MAGNESIUM: Magnesium: 2.1 mg/dL (ref 1.5–2.5)

## 2019-02-15 LAB — TSH: TSH: 2.36 mIU/L (ref 0.40–4.50)

## 2019-02-15 MED ORDER — LISINOPRIL-HYDROCHLOROTHIAZIDE 20-12.5 MG PO TABS: ORAL_TABLET | ORAL | 1 refills | Status: DC

## 2019-02-15 MED ORDER — AMLODIPINE BESYLATE 10 MG PO TABS: ORAL_TABLET | ORAL | 1 refills | Status: DC

## 2019-03-18 ENCOUNTER — Telehealth: Payer: Self-pay | Admitting: *Deleted

## 2019-03-18 NOTE — Telephone Encounter (Signed)
   TELEPHONE CALL NOTE  This patient has been deemed a candidate for follow-up tele-health visit to limit community exposure during the Covid-19 pandemic. I spoke with the patient via phone to discuss instructions.  The patient will receive a phone call 2-3 days prior to their E-Visit at which time consent will be verbally confirmed.   A Virtual Office Visit appointment type has been scheduled for  9/17 AT 8:20 AM with DR HARDING, with "VIDEO-TEXT  I have confirmed the patient is active in [place], [place], [place] 03/18/2019 3:09 PM

## 2019-03-21 ENCOUNTER — Telehealth (INDEPENDENT_AMBULATORY_CARE_PROVIDER_SITE_OTHER): Payer: 59 | Admitting: Cardiology

## 2019-03-21 ENCOUNTER — Telehealth: Payer: Self-pay | Admitting: *Deleted

## 2019-03-21 ENCOUNTER — Encounter: Payer: Self-pay | Admitting: Cardiology

## 2019-03-21 VITALS — BP 140/78 | HR 73 | Ht 73.0 in | Wt 225.0 lb

## 2019-03-21 DIAGNOSIS — E1169 Type 2 diabetes mellitus with other specified complication: Secondary | ICD-10-CM | POA: Diagnosis not present

## 2019-03-21 DIAGNOSIS — I10 Essential (primary) hypertension: Secondary | ICD-10-CM

## 2019-03-21 DIAGNOSIS — I700 Atherosclerosis of aorta: Secondary | ICD-10-CM | POA: Diagnosis not present

## 2019-03-21 DIAGNOSIS — E785 Hyperlipidemia, unspecified: Secondary | ICD-10-CM

## 2019-03-21 DIAGNOSIS — I4510 Unspecified right bundle-branch block: Secondary | ICD-10-CM | POA: Diagnosis not present

## 2019-03-21 NOTE — Progress Notes (Signed)
Virtual Visit via Video Note   This visit type was conducted due to national recommendations for restrictions regarding the COVID-19 Pandemic (e.g. social distancing) in an effort to limit this patient's exposure and mitigate transmission in our community.  Due to his co-morbid illnesses, this patient is at least at moderate risk for complications without adequate follow up.  This format is felt to be most appropriate for this patient at this time.  All issues noted in this document were discussed and addressed.  A limited physical exam was performed with this format.  Please refer to the patient's chart for his consent to telehealth for [facility].   Patient has given verbal permission to conduct this visit via virtual appointment and to bill insurance 03/24/2019 1:49 PM     Evaluation Performed:  Follow-up visit  Date:  03/24/2019   ID:  Nathaniel Gill, Nathaniel Gill 1950/09/30, MRN [id number]  Patient Location: Home Provider Location: Home  PCP:  Unk Pinto, MD  Cardiologist:  [name], MD  Electrophysiologist:  None   Chief Complaint: Annual follow-up  History of Present Illness:    Nathaniel Gill is a 68 y.o. male with PMH notable for difficult control ("resistant") hypertension as well as hyperlipidemia who presents via audio/video conferencing for a telehealth visit today.  He has a significant family history of CAD, but himself has no history of heart attack or stroke & was initially referred by Dr. Melford Aase to assist with BP control. He does not smoke and does not drink.  Nathaniel Gill was last seen in September 2019.  He was doing well, no symptoms. He recently joined the [employer] - TM 15-20 min, weights & machines --. 3-4 x / week.  Now no longer gets SOB with routine activity.  Feeling much better having truly gotten in shape.   Interval History: Nathaniel Gill is being seen here today for an annual follow-up.  He indicates that he is doing quite well.  His  blood pressure has been followed by his PCP and been relatively controlled.  Is a little bit higher today than what he usually gets.  The last time he saw his PCP it was in the 120s to 130s on average for systolic pressures.  He tells me that he is still trying to his exercise, but has been a little bit upset about not being on to go back to the [facility].  Despite that, the exercise he is able to do, and hoping to get back into the [facility] soon has kept him in pretty good shape.  He has lost weight with a just a diet and continued exercise.  He says that he is able to get his heart rate up (watching using apple watch) 210-120 beats a minute and within the first minute or so it goes down to below 90.  No chest pain or pressure pressure exertion.  No headache or blurred vision.  No real dyspnea on exertion.  No PND, orthopnea or edema.  He said that he is got 2 older brothers both of whom have recently been diagnosed with what he thinks is A. fib.  1 of them is older and has been a smoker for years, the other he says is just a Water engineer "with multiple other cardiac comorbidities.  Neither of them are nearly as active as he is.  They also are both smokers.    Cardiovascular ROS: no chest pain or dyspnea on exertion positive for - Very rare skipped beats,  usually when resting negative for - edema, palpitations, paroxysmal nocturnal dyspnea, rapid heart rate, shortness of breath or Syncope/near syncope, TIA/amaurosis fugax, claudication  The patient does not have symptoms concerning for COVID-19 infection (fever, chills, cough, or new shortness of breath).  The patient is practicing social distancing. Has private office @ work.   ROS:  Please see the history of present illness.    Review of Systems  Constitutional: Positive for weight loss (Intentional with diet and exercise). Negative for malaise/fatigue.  HENT: Negative for nosebleeds.   Respiratory: Negative for shortness of breath.   Gastrointestinal:  Positive for heartburn. Negative for abdominal pain, blood in stool and melena.  Genitourinary: Negative for hematuria.  Musculoskeletal: Negative for joint pain (Just mild arthritis pain.).  Neurological: Negative for dizziness and headaches.  Psychiatric/Behavioral: Negative.     Past Medical History:  Diagnosis Date  . Hyperlipidemia   . Hypertension    No sign of renal artery stenosis  . Vertigo    Past Surgical History:  Procedure Laterality Date  . ABDOMINAL SURGERY  1956   Related to bleeding episode.   Marland Kitchen CARDIAC CATHETERIZATION  2005   no intervention per patient  . CYST REMOVAL HAND Left 1975   Palm  . SKIN LESION EXCISION    . STAPEDECTOMY Bilateral   . TRANSTHORACIC ECHOCARDIOGRAM  07/2017   Normal LV size and function.  EF 55-60%.  No RWMA.  Basal septal hypertrophy, but no sign of significant hypertensive heart disease.  Only GR 1 DD.  Marland Kitchen TRIGGER FINGER RELEASE Right 2014   Thumb     Current Meds  Medication Sig  . amLODipine (NORVASC) 10 MG tablet Take 1 tablet Daily for BP  . aspirin 81 MG tablet Take 81 mg by mouth daily.  . Black Pepper-Turmeric (TURMERIC COMPLEX/BLACK PEPPER PO) Take 500 mg by mouth daily.   . carvedilol (COREG) 12.5 MG tablet TAKE ONE TABLET BY MOUTH TWICE A DAY  . Cholecalciferol 50 MCG (2000 UT) CAPS Take 8,000 Units by mouth daily.  Marland Kitchen CINNAMON PO Take 1,000 mg by mouth daily.  Marland Kitchen lisinopril-hydrochlorothiazide (ZESTORETIC) 20-12.5 MG tablet Take 1 tablet Daily for BP  . minoxidil (LONITEN) 10 MG tablet TAKE ONE-HALF TABLET BY MOUTH DAILY AT NIGHT  . OVER THE COUNTER MEDICATION APPLE CIDER VINEGAR - 1 TABLESPOON DAILY  . phentermine (ADIPEX-P) 37.5 MG tablet Take 1/2 to 1 tablet every morning for dieting & weightloss  . rosuvastatin (CRESTOR) 40 MG tablet TAKE 1 TABLET BY MOUTH DAILY  . sildenafil (VIAGRA) 100 MG tablet Take 1/4-1/2 tab as needed.  . vitamin C (ASCORBIC ACID) 500 MG tablet Take 500 mg by mouth daily.  . Zinc 50 MG TABS  Take by mouth daily.     Allergies:   Patient has no known allergies.   Social History   Tobacco Use  . Smoking status: Never Smoker  . Smokeless tobacco: Never Used  Substance Use Topics  . Alcohol use: Yes    Comment: Social occasional - once a month  . Drug use: No    Comment: In 24s - cocaine, marijuana     Family Hx: The patient's family history includes Arrhythmia in his brother; COPD in his mother; Heart attack in his brother; Heart disease in his mother; Hyperlipidemia in his brother and brother; Hypertension in his brother and brother. -- Both older brothers - have Afib.  (the younger of the 2 is generally unhealthy).   Prior CV studies:   The following  studies were reviewed today: . No new  Labs/Other Tests and Data Reviewed:    EKG:  No ECG reviewed.  Recent Labs: 02/14/2019: ALT 29; BUN 17; Creat 0.95; Hemoglobin 13.2; Magnesium 2.1; Platelets 301; Potassium 4.3; Sodium 138; TSH 2.36   Recent Lipid Panel Lab Results  Component Value Date/Time   CHOL 121 02/14/2019 10:39 AM   TRIG 76 02/14/2019 10:39 AM   HDL 43 02/14/2019 10:39 AM   CHOLHDL 2.8 02/14/2019 10:39 AM   LDLCALC 62 02/14/2019 10:39 AM    Wt Readings from Last 3 Encounters:  03/21/19 225 lb (102.1 kg)  02/14/19 230 lb (104.3 kg)  10/31/18 233 lb (105.7 kg)   Has been as low as 219 - watching diet & exercising more.  2-3 d/week exercises - walks 2-3 miles & does light weights & calisthenics every AM before work.   Objective:    Vital Signs:  BP 140/78 (BP Location: Left Arm)   Pulse 73   Ht 6\' 1"  (1.854 m)   Wt 225 lb (102.1 kg)   BMI 29.69 kg/m   VITAL SIGNS:  reviewed GEN:  no acute distress CARDIOVASCULAR:  no peripheral edema NEURO:  alert and oriented x 3, no obvious focal deficit PSYCH:  normal affect BP has been better with PCP. -- His log shows usual range in ~110s-120s.    ASSESSMENT & PLAN:    Problem List Items Addressed This Visit    Hyperlipidemia associated with  type 2 diabetes mellitus ([place]) (Chronic)    Just had his cholesterol levels checked.  Levels look great without being on medications.      Resistant hypertension (Chronic)    His blood pressure actually looks pretty good today.  Looks like he never really went up on the full dose of carvedilol.  He still on 12-1/2 twice daily.  Hopefully with the increase exercise and weight loss, he will be able to maintain his blood pressures.  The intention would be to potentially reduce the minoxidil which was a relatively new medication.      RBBB (right bundle branch block) (Chronic)    Probably relatively benign with normal echocardiogram.      Atherosclerosis of aorta (HCC) (Chronic)    Clearly he has signs of atherosclerosis, and close monitoring of lipids as well as blood pressure is warranted.  May consider screening AAA scan by age 44.         COVID-19 Education: The signs and symptoms of COVID-19 were discussed with the patient and how to seek care for testing (follow up with PCP or arrange E-visit).   The importance of social distancing was discussed today.  Time:   Today, I have spent 20 minutes with the patient with telehealth technology discussing the above problems.     Medication Adjustments/Labs and Tests Ordered: Current medicines are reviewed at length with the patient today.  Concerns regarding medicines are outlined above.   Patient Instructions  Medication Instructions:  No changes Keep working on loosing weight -- the goal is to hopefully be able to stop a few of your BP medications.  If you need a refill on your cardiac medications before your next appointment, please call your pharmacy.   Lab work: none  If  Testing/Procedures:  No new studies  Follow-Up: At [facility], you and your health needs are our priority.  As part of our continuing mission to provide you with exceptional heart care, we have created designated Provider Care Teams.  These Care  Teams  include your primary Cardiologist (physician) and Advanced Practice Providers (APPs -  Physician Assistants and Nurse Practitioners) who all work together to provide you with the care you need, when you need it. . You will need a follow up appointment in 12   Months  Sept 2021.  Please call our office 2 months in advance to schedule this appointment.  You may see [name], MD or one of the following Advanced Practice Providers on your designated Care Team:   . Rosaria Ferries, PA-C . Jory Sims, DNP, ANP  Any Other Special Instructions Will Be Listed Below (If Applicable).     Signed, [name], MD  03/24/2019 1:49 PM    [place]

## 2019-03-21 NOTE — Patient Instructions (Addendum)
Medication Instructions:  No changes Keep working on loosing weight -- the goal is to hopefully be able to stop a few of your BP medications.  If you need a refill on your cardiac medications before your next appointment, please call your pharmacy.   Lab work: none  If  Testing/Procedures:  No new studies  Follow-Up: At [facility], you and your health needs are our priority.  As part of our continuing mission to provide you with exceptional heart care, we have created designated Provider Care Teams.  These Care Teams include your primary Cardiologist (physician) and Advanced Practice Providers (APPs -  Physician Assistants and Nurse Practitioners) who all work together to provide you with the care you need, when you need it. . You will need a follow up appointment in 12   Months  Sept 2021.  Please call our office 2 months in advance to schedule this appointment.  You may see [name], MD or one of the following Advanced Practice Providers on your designated Care Team:   . Rosaria Ferries, PA-C . Jory Sims, DNP, ANP  Any Other Special Instructions Will Be Listed Below (If Applicable).

## 2019-03-21 NOTE — Telephone Encounter (Signed)
SPOKE TO PATIENT. INSTRUCTION WAS GIVEN FROM TODAY'S VIRTUAL VISIT.  AVS SUMMARY  IS SENT VIA MYCHART.  PATIENT VERBALIZED UNDERSTANDING.

## 2019-03-24 ENCOUNTER — Encounter: Payer: Self-pay | Admitting: Cardiology

## 2019-03-24 NOTE — Assessment & Plan Note (Signed)
Just had his cholesterol levels checked.  Levels look great without being on medications.

## 2019-03-24 NOTE — Assessment & Plan Note (Signed)
Clearly he has signs of atherosclerosis, and close monitoring of lipids as well as blood pressure is warranted.  May consider screening AAA scan by age 68.

## 2019-03-24 NOTE — Assessment & Plan Note (Signed)
His blood pressure actually looks pretty good today.  Looks like he never really went up on the full dose of carvedilol.  He still on 12-1/2 twice daily.  Hopefully with the increase exercise and weight loss, he will be able to maintain his blood pressures.  The intention would be to potentially reduce the minoxidil which was a relatively new medication.

## 2019-03-24 NOTE — Assessment & Plan Note (Signed)
Probably relatively benign with normal echocardiogram.

## 2019-03-27 ENCOUNTER — Other Ambulatory Visit: Payer: Self-pay

## 2019-03-27 DIAGNOSIS — Z20822 Contact with and (suspected) exposure to covid-19: Secondary | ICD-10-CM

## 2019-03-29 LAB — NOVEL CORONAVIRUS, NAA: SARS-CoV-2, NAA: NOT DETECTED

## 2019-04-02 ENCOUNTER — Other Ambulatory Visit: Payer: Self-pay | Admitting: Adult Health

## 2019-04-02 ENCOUNTER — Other Ambulatory Visit: Payer: Self-pay | Admitting: Cardiology

## 2019-04-02 DIAGNOSIS — I10 Essential (primary) hypertension: Secondary | ICD-10-CM

## 2019-04-17 ENCOUNTER — Encounter: Payer: Self-pay | Admitting: Adult Health

## 2019-04-20 ENCOUNTER — Other Ambulatory Visit: Payer: Self-pay | Admitting: Adult Health

## 2019-04-26 ENCOUNTER — Encounter (INDEPENDENT_AMBULATORY_CARE_PROVIDER_SITE_OTHER): Payer: Self-pay

## 2019-05-16 NOTE — Progress Notes (Signed)
Complete Physical  Assessment and Plan:  Diagnoses and all orders for this visit:  Encounter for routine adult health examination with abnormal findings  Atherosclerosis of aorta Per imaging, CXR 07/2018 Control blood pressure, cholesterol, glucose, increase exercise.   Resistant hypertension Controlled on current medications Monitor blood pressure at home; call if consistently over 130/80 Continue DASH diet.   Reminder to go to the ER if any CP, SOB, nausea, dizziness, severe HA, changes vision/speech, left arm numbness and tingling and jaw pain.  RBBB (right bundle branch block) Monitor, followed by cardiology  Gastroesophageal reflux disease, esophagitis presence not specified Well managed on current medications Discussed diet, avoiding triggers and other lifestyle changes  Vitamin D deficiency Continue supplementation Check vitamin D level  Systolic murmur Normal ECHO Followed by cardiology  Diet controlled T2DM [facility])  Education: Reviewed 'ABCs' of diabetes management (respective goals in parentheses):  A1C (<7), blood pressure (<130/80), and cholesterol (LDL <70) Eye Exam yearly and Dental Exam every 6 months. Dietary recommendations Physical Activity recommendations - A1C - Diabetes foot exam   CKD II associated with T2DM (HCC) Increase fluids, avoid NSAIDS, monitor sugars, will monitor  Obesity- BMI 31 Long discussion about weight loss, diet, and exercise Recommended diet heavy in fruits and veggies and low in animal meats, cheeses, and dairy products, appropriate calorie intake Discussed appropriate weight for height Patient on phentermine with benefit and no SE, taking drug breaks; continue close follow up.  Follow up at next visit  Medication management CBC, CMP/GFR, magnesium, UA  Hyperlipidemia associated with T2DM (HCC) Continue medications Continue low cholesterol diet and exercise.  Check lipid panel.   Erectile dysfunction associated with T2DM  (HCC)  Control blood pressure, cholesterol, glucose, increase exercise.  Sildenafil PRN  Arthritis/decenerative disk Continue follow up with ortho, PRN gabapentin, NSAIDs working well   Dysphagia Normal exam by ENT; discussed barium swallow vs GI; he is interested in barium swallow but req defer to 2021  Orders Placed This Encounter  Procedures  . CBC with Diff  . COMPLETE METABOLIC PANEL WITH GFR  . Magnesium  . Lipid Profile  . TSH  . Hemoglobin A1c ([facility])  . Vitamin D (25 hydroxy)  . Microalbumin / Creatinine Urine Ratio  . Urinalysis, Routine w reflex microscopic  . PSA  . EKG 12-Lead  . HM DIABETES FOOT EXAM     Discussed med's effects and SE's. Screening labs and tests as requested with regular follow-up as recommended. Over 45 minutes of exam, counseling, chart review, coordination of care and critical decision making was performed for establishment of new patient with complete physical.   Future Appointments  Date Time Provider [place]  05/19/2020 10:00 AM Liane Comber, NP [facility] None     HPI BP 130/76   Pulse 82   Temp (!) 97.3 F (36.3 C)   Ht 6' (1.829 m)   Wt 228 lb 12.8 oz (103.8 kg)   SpO2 97%   BMI 31.03 kg/m   The patient is a very pleasant 68 y.o., appears young for age, married senior Education administrator, who moved back to this area in 2018 after working in [place] for 5 years. He has Resistant hypertension; Hyperlipidemia associated with type 2 diabetes mellitus ([place]); Degenerative lumbar disc; RBBB (right bundle branch block); Diet-controlled diabetes mellitus ([place]); Acid reflux; Vitamin D deficiency; Medication management; Erectile dysfunction associated with type 2 diabetes mellitus ([place]); Systolic murmur; Overweight (BMI 25.0-29.9); Atherosclerosis of aorta ([place]); and CKD stage 2 due to type 2 diabetes  mellitus ([place]) on their problem list.   He is married, 1 grown son, no grandkids but getting married next year in Jan.  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   He follows with [facility] Levy Pupa for multiple ortho complaints. Has had lumbar pain, degenerative disk with intermittent R radicular sx, has had injection with good results for 9 months, also benefits from PRN gabapentin 300 mg, advil/aleve.     He has had intermittent dysphagia, sensation of food getting stuck behind chest, with pork, rice; has seen ENT with upper scope which was unremarkable; he is interested in barium swallow but has opted to defer for now in light of pandemic.   he is prescribed phentermine for weight loss, taking 1 tab daily.  While on the medication they have lost 0 lbs since last visit, but reports on home scale is seeing <220lb. They deny palpitations, anxiety, trouble sleeping, elevated BP. Would like to get to <215 lb.   BMI is Body mass index is 31.03 kg/m., he has been working on diet and exercise.  Wt Readings from Last 3 Encounters:  05/20/19 228 lb 12.8 oz (103.8 kg)  03/21/19 225 lb (102.1 kg)  02/14/19 230 lb (104.3 kg)   He has atherosclerosis of aorta noted on imaging (CXR 07/2018) He exercises nearly every day (cardio/weights 3 days a week), watches his diet, doing intermittent fasting some days by skipping breakfast, and would like to work towards getting off of some medications. Never smoker, drinks very rarely, 3 cups of coffee a day, 4-6x16.9 fl oz bottles of water daily. Performs regular self-testicular exams without concerns.   His blood pressure has been controlled at home, followed by Dr. Ellyn Hack annually, today their BP is BP: 130/76  He does workout. He denies chest pain, shortness of breath, dizziness.   He is on cholesterol medication (rosuvastatin 40 mg daily) and denies myalgias. His cholesterol is not at goal. The cholesterol last visit was:   Lab Results  Component Value Date   CHOL 121 02/14/2019   HDL 43 02/14/2019   LDLCALC 62 02/14/2019   TRIG 76 02/14/2019    CHOLHDL 2.8 02/14/2019    He has been working on diet and exercise for T2DM managed by lifestyle (6.5% 04/2018, 6.6% 02/2019), he is on ASA, ACEi, statin and denies foot ulcerations, increased appetite, nausea, paresthesia of the feet, polydipsia, polyuria and visual disturbances. He does have erectile dysfunction, uses sildenafil 25-50 mg PRN. Last A1C in the office was:  Lab Results  Component Value Date   HGBA1C 6.6 (H) 02/14/2019    He has stable CKD II associated with T2DM monitored via this office:  Lab Results  Component Value Date   GFRNONAA 83 02/14/2019   Patient is on Vitamin D supplement, unsure of dose, ? 4000 IU daily Lab Results  Component Value Date   VD25OH 32 07/18/2018     Lab Results  Component Value Date   PSA 1.3 04/16/2018   PSA 1.3 04/13/2017      Current Medications:  Current Outpatient Medications on File Prior to Visit  Medication Sig Dispense Refill  . aspirin 81 MG tablet Take 81 mg by mouth daily.    . Black Pepper-Turmeric (TURMERIC COMPLEX/BLACK PEPPER PO) Take 500 mg by mouth daily.     . carvedilol (COREG) 12.5 MG tablet TAKE ONE TABLET BY MOUTH TWICE A DAY 180 tablet 0  . Cholecalciferol 50 MCG (2000  UT) CAPS Take 8,000 Units by mouth daily.    Marland Kitchen CINNAMON PO Take 1,000 mg by mouth daily.    . minoxidil (LONITEN) 10 MG tablet TAKE ONE-HALF TABLET BY MOUTH DAILY AT NIGHT 45 tablet 0  . OVER THE COUNTER MEDICATION APPLE CIDER VINEGAR - 1 TABLESPOON DAILY    . phentermine (ADIPEX-P) 37.5 MG tablet TAKE 1/2 TO 1 TABLET BY MOUTH EVERY MORNING FOR DIETING AND WEIGHT LOSS 30 tablet 1  . rosuvastatin (CRESTOR) 40 MG tablet TAKE ONE TABLET BY MOUTH DAILY 90 tablet 0  . sildenafil (VIAGRA) 100 MG tablet Take 1/4-1/2 tab as needed. 30 tablet 1  . vitamin C (ASCORBIC ACID) 500 MG tablet Take 500 mg by mouth daily.    . Zinc 50 MG TABS Take by mouth daily.     No current facility-administered medications on file prior to visit.    Allergies:  No Known  Allergies Health Maintenance:  Immunization History  Administered Date(s) Administered  . Pneumococcal Conjugate-13 04/16/2018    Tetanus: 2014 with laceration Pneumovax: DUE  Prevnar 13: 2019 Flu vaccine: Denies Zostavax: Denies PPD: negative 2011-2016  ECHO: 07/2017 normal   Colonoscopy: 2011 normal due 2021 EGD: 2003? - After several episodes of dysphagia/choking  Eye Exam: Dr. Lucious Groves, [facility], 01/14/2019, normal - will request report Dentist: Dr. Arnell Sieving, [place], last visit 2020  Patient Care Team: Unk Pinto, MD as PCP - General (Internal Medicine) Leonie Man, MD as PCP - Cardiology (Cardiology)  Medical History:  has Resistant hypertension; Hyperlipidemia associated with type 2 diabetes mellitus ([place]); Degenerative lumbar disc; RBBB (right bundle branch block); Diet-controlled diabetes mellitus ([place]); Acid reflux; Vitamin D deficiency; Medication management; Erectile dysfunction associated with type 2 diabetes mellitus ([place]); Systolic murmur; Overweight (BMI 25.0-29.9); Atherosclerosis of aorta ([place]); and CKD stage 2 due to type 2 diabetes mellitus ([place]) on their problem list. Surgical History:  He  has a past surgical history that includes Cardiac catheterization (2005); Abdominal surgery (1956); Stapedectomy (Bilateral); Trigger finger release (Right, 2014); Cyst removal hand (Left, 1975); Skin lesion excision; and transthoracic echocardiogram (07/2017). Family History:  His family history includes Arrhythmia in his brother; COPD in his mother; Heart attack in his brother; Heart disease in his mother; Hyperlipidemia in his brother and brother; Hypertension in his brother and brother. Social History:   reports that he has never smoked. He has never used smokeless tobacco. He reports current alcohol use. He reports that he does not use drugs. Review of Systems:  Review of Systems  Constitutional: Negative.  Negative for weight loss.  HENT: Negative  for congestion, hearing loss and sore throat.   Eyes: Negative for blurred vision.  Respiratory: Negative for cough, shortness of breath and wheezing.   Cardiovascular: Negative for chest pain and palpitations.  Gastrointestinal: Negative for abdominal pain, blood in stool, constipation, melena and nausea.  Genitourinary: Negative.   Musculoskeletal: Positive for back pain (intermittent lumbar) and joint pain (L knee, bil ankles and feet). Negative for myalgias.  Skin: Negative.   Neurological: Negative for dizziness, sensory change and headaches.  Endo/Heme/Allergies: Negative.  Negative for environmental allergies. Does not bruise/bleed easily.  Psychiatric/Behavioral: Negative.    Physical Exam: Estimated body mass index is 31.03 kg/m as calculated from the following:   Height as of this encounter: 6' (1.829 m).   Weight as of this encounter: 228 lb 12.8 oz (103.8 kg). BP 130/76   Pulse 82   Temp (!) 97.3 F (36.3 C)  Ht 6' (1.829 m)   Wt 228 lb 12.8 oz (103.8 kg)   SpO2 97%   BMI 31.03 kg/m    General Appearance: Well nourished, in no apparent distress.  Eyes: PERRLA, EOMs, conjunctiva no swelling or erythema, fundal exam deferred to ophth Sinuses: No Frontal/maxillary tenderness  ENT/Mouth: Ext aud canals clear, normal light reflex with TMs without erythema, bulging. Good dentition. No erythema, swelling, or exudate on post pharynx. Tonsils not swollen or erythematous. Hearing normal.  Neck: Supple, thyroid normal. No bruits  Respiratory: Respiratory effort normal, BS equal bilaterally without rales, rhonchi, wheezing or stridor.  Cardio: RR, bradycardic, without murmurs, rubs or gallops. Brisk peripheral pulses without edema.  Chest: symmetric, with normal excursions and percussion.  Abdomen: Soft, nontender, no guarding, rebound, hernias, masses, or organomegaly.  Lymphatics: Non tender without lymphadenopathy.  Genitourinary: Patient declines Musculoskeletal: Full ROM  all peripheral extremities,5/5 strength, and normal gait. Bony growth/deformity noted to superior aspect of bil MCP joint of 1st digit- ROM intact, without signs of acute inflammation.  Skin: Warm, dry without rashes, ecchymosis. Multiple nevi to back; benign appearing, all <6 mm Neuro: Cranial nerves intact, reflexes equal bilaterally. Normal muscle tone, no cerebellar symptoms. Sensation intact.  Psych: Awake and oriented X 3, normal affect, Insight and Judgment appropriate.   EKG: SR, RBBB   Gorden Harms Aayush Gelpi 10:47 AM [facility]

## 2019-05-20 ENCOUNTER — Ambulatory Visit: Payer: 59 | Admitting: Adult Health

## 2019-05-20 ENCOUNTER — Other Ambulatory Visit: Payer: Self-pay

## 2019-05-20 ENCOUNTER — Encounter: Payer: Self-pay | Admitting: Adult Health

## 2019-05-20 VITALS — BP 130/76 | HR 82 | Temp 97.3°F | Ht 72.0 in | Wt 228.8 lb

## 2019-05-20 DIAGNOSIS — K219 Gastro-esophageal reflux disease without esophagitis: Secondary | ICD-10-CM

## 2019-05-20 DIAGNOSIS — Z0000 Encounter for general adult medical examination without abnormal findings: Secondary | ICD-10-CM

## 2019-05-20 DIAGNOSIS — E663 Overweight: Secondary | ICD-10-CM

## 2019-05-20 DIAGNOSIS — I700 Atherosclerosis of aorta: Secondary | ICD-10-CM | POA: Diagnosis not present

## 2019-05-20 DIAGNOSIS — I10 Essential (primary) hypertension: Secondary | ICD-10-CM | POA: Diagnosis not present

## 2019-05-20 DIAGNOSIS — Z125 Encounter for screening for malignant neoplasm of prostate: Secondary | ICD-10-CM

## 2019-05-20 DIAGNOSIS — E785 Hyperlipidemia, unspecified: Secondary | ICD-10-CM

## 2019-05-20 DIAGNOSIS — Z136 Encounter for screening for cardiovascular disorders: Secondary | ICD-10-CM

## 2019-05-20 DIAGNOSIS — Z79899 Other long term (current) drug therapy: Secondary | ICD-10-CM

## 2019-05-20 DIAGNOSIS — I4510 Unspecified right bundle-branch block: Secondary | ICD-10-CM | POA: Diagnosis not present

## 2019-05-20 DIAGNOSIS — E1122 Type 2 diabetes mellitus with diabetic chronic kidney disease: Secondary | ICD-10-CM

## 2019-05-20 DIAGNOSIS — M5136 Other intervertebral disc degeneration, lumbar region: Secondary | ICD-10-CM

## 2019-05-20 DIAGNOSIS — Z0001 Encounter for general adult medical examination with abnormal findings: Secondary | ICD-10-CM

## 2019-05-20 DIAGNOSIS — Z23 Encounter for immunization: Secondary | ICD-10-CM | POA: Diagnosis not present

## 2019-05-20 DIAGNOSIS — N521 Erectile dysfunction due to diseases classified elsewhere: Secondary | ICD-10-CM

## 2019-05-20 DIAGNOSIS — E119 Type 2 diabetes mellitus without complications: Secondary | ICD-10-CM

## 2019-05-20 DIAGNOSIS — E1169 Type 2 diabetes mellitus with other specified complication: Secondary | ICD-10-CM

## 2019-05-20 DIAGNOSIS — E559 Vitamin D deficiency, unspecified: Secondary | ICD-10-CM

## 2019-05-20 DIAGNOSIS — N182 Chronic kidney disease, stage 2 (mild): Secondary | ICD-10-CM

## 2019-05-20 DIAGNOSIS — R011 Cardiac murmur, unspecified: Secondary | ICD-10-CM

## 2019-05-20 MED ORDER — LISINOPRIL-HYDROCHLOROTHIAZIDE 20-12.5 MG PO TABS: ORAL_TABLET | ORAL | 1 refills | Status: DC

## 2019-05-20 MED ORDER — GABAPENTIN 300 MG PO CAPS: 300.0000 mg | ORAL_CAPSULE | Freq: Three times a day (TID) | ORAL | 1 refills | Status: DC | PRN

## 2019-05-20 MED ORDER — AMLODIPINE BESYLATE 10 MG PO TABS: ORAL_TABLET | ORAL | 1 refills | Status: DC

## 2019-05-20 NOTE — Patient Instructions (Addendum)
[name] , Thank you for taking time to come for your Annual Wellness Visit. I appreciate your ongoing commitment to your health goals. Please review the following plan we discussed and let me know if I can assist you in the future.   These are the goals we discussed: Goals    . DIET - INCREASE WATER INTAKE     Recommend 65-80 fluid ounces daily     . Exercise 150 min/wk Moderate Activity    . LDL CALC < 100    . Weight (lb) < 220 lb (99.8 kg)       This is a list of the screening recommended for you and due dates:  Health Maintenance  Topic Date Due  . Pneumonia vaccines (2 of 2 - PPSV23) 04/17/2019  . Colon Cancer Screening  07/05/2019  . Hemoglobin A1C  08/17/2019  . Eye exam for diabetics  01/15/2020  . Complete foot exam   05/19/2020  . Tetanus Vaccine  07/04/2022  .  Hepatitis C: One time screening is recommended by Center for Disease Control  (CDC) for  adults born from [birth year] through 1963.   Completed  . Flu Shot  Discontinued      Artists in [place] - medical massage    Know what a healthy weight is for you (roughly BMI <25) and aim to maintain this  Aim for 7+ servings of fruits and vegetables daily  65-80+ fluid ounces of water or unsweet tea for healthy kidneys  Limit to max 1 drink of alcohol per day; avoid smoking/tobacco  Limit animal fats in diet for cholesterol and heart health - choose grass fed whenever available  Avoid highly processed foods, and foods high in saturated/trans fats  Aim for low stress - take time to unwind and care for your mental health  Aim for 150 min of moderate intensity exercise weekly for heart health, and weights twice weekly for bone health  Aim for 7-9 hours of sleep daily       Diabetes Mellitus and Standards of Medical Care Managing diabetes (diabetes mellitus) can be complicated. Your diabetes treatment may be managed by a team of health care providers, including:  A physician who specializes in  diabetes (endocrinologist).  A nurse practitioner or physician assistant.  Nurses.  A diet and nutrition specialist (registered dietitian).  A certified diabetes educator (CDE).  An exercise specialist.  A pharmacist.  An eye doctor.  A foot specialist (podiatrist).  A dentist.  A primary care provider.  A mental health provider. Your health care providers follow guidelines to help you get the best quality of care. The following schedule is a general guideline for your diabetes management plan. Your health care providers may give you more specific instructions. Physical exams Upon being diagnosed with diabetes mellitus, and each year after that, your health care provider will ask about your medical and family history. He or she will also do a physical exam. Your exam may include:  Measuring your height, weight, and body mass index (BMI).  Checking your blood pressure. This will be done at every routine medical visit. Your target blood pressure may vary depending on your medical conditions, your age, and other factors.  Thyroid gland exam.  Skin exam.  Screening for damage to your nerves (peripheral neuropathy). This may include checking the pulse in your legs and feet and checking the level of sensation in your hands and feet.  A complete foot exam to inspect the structure  and skin of your feet, including checking for cuts, bruises, redness, blisters, sores, or other problems.  Screening for blood vessel (vascular) problems, which may include checking the pulse in your legs and feet and checking your temperature. Blood tests Depending on your treatment plan and your personal needs, you may have the following tests done:  HbA1c (hemoglobin A1c). This test provides information about blood sugar (glucose) control over the previous 2-3 months. It is used to adjust your treatment plan, if needed. This test will be done: ? At least 2 times a year, if you are meeting your  treatment goals. ? 4 times a year, if you are not meeting your treatment goals or if treatment goals have changed.  Lipid testing, including total, LDL, and HDL cholesterol and triglyceride levels. ? The goal for LDL is less than 100 mg/dL (5.5 mmol/L). If you are at high risk for complications, the goal is less than 70 mg/dL (3.9 mmol/L). ? The goal for HDL is 40 mg/dL (2.2 mmol/L) or higher for men and 50 mg/dL (2.8 mmol/L) or higher for women. An HDL cholesterol of 60 mg/dL (3.3 mmol/L) or higher gives some protection against heart disease. ? The goal for triglycerides is less than 150 mg/dL (8.3 mmol/L).  Liver function tests.  Kidney function tests.  Thyroid function tests. Dental and eye exams  Visit your dentist two times a year.  If you have type 1 diabetes, your health care provider may recommend an eye exam 3-5 years after you are diagnosed, and then once a year after your first exam. ? For children with type 1 diabetes, a health care provider may recommend an eye exam when your child is age 29 or older and has had diabetes for 3-5 years. After the first exam, your child should get an eye exam once a year.  If you have type 2 diabetes, your health care provider may recommend an eye exam as soon as you are diagnosed, and then once a year after your first exam. Immunizations   The yearly flu (influenza) vaccine is recommended for everyone 6 months or older who has diabetes.  The pneumonia (pneumococcal) vaccine is recommended for everyone 2 years or older who has diabetes. If you are 71 or older, you may get the pneumonia vaccine as a series of two separate shots.  The hepatitis B vaccine is recommended for adults shortly after being diagnosed with diabetes.  Adults and children with diabetes should receive all other vaccines according to age-specific recommendations from the Centers for Disease Control and Prevention (CDC). Mental and emotional health Screening for symptoms  of eating disorders, anxiety, and depression is recommended at the time of diagnosis and afterward as needed. If your screening shows that you have symptoms (positive screening result), you may need more evaluation and you may work with a mental health care provider. Treatment plan Your treatment plan will be reviewed at every medical visit. You and your health care provider will discuss:  How you are taking your medicines, including insulin.  Any side effects you are experiencing.  Your blood glucose target goals.  The frequency of your blood glucose monitoring.  Lifestyle habits, such as activity level as well as tobacco, alcohol, and substance use. Diabetes self-management education Your health care provider will assess how well you are monitoring your blood glucose levels and whether you are taking your insulin correctly. He or she may refer you to:  A certified diabetes educator to manage your diabetes throughout your life,  starting at diagnosis.  A registered dietitian who can create or review your personal nutrition plan.  An exercise specialist who can discuss your activity level and exercise plan. Summary  Managing diabetes (diabetes mellitus) can be complicated. Your diabetes treatment may be managed by a team of health care providers.  Your health care providers follow guidelines in order to help you get the best quality of care.  Standards of care including having regular physical exams, blood tests, blood pressure monitoring, immunizations, screening tests, and education about how to manage your diabetes.  Your health care providers may also give you more specific instructions based on your individual health. This information is not intended to replace advice given to you by your health care provider. Make sure you discuss any questions you have with your health care provider. Document Released: 04/17/2009 Document Revised: 03/09/2018 Document Reviewed:  03/18/2016 Elsevier Patient Education  2020 [employer].

## 2019-05-21 LAB — PSA: PSA: 1.6 ng/mL (ref <=4.0)

## 2019-05-21 LAB — CBC WITH DIFFERENTIAL/PLATELET
Absolute Monocytes: 638 cells/uL (ref 200–950)
Basophils Absolute: 112 cells/uL (ref 0–200)
Basophils Relative: 2 %
Eosinophils Absolute: 364 cells/uL (ref 15–500)
Eosinophils Relative: 6.5 %
HCT: 43.9 % (ref 38.5–50.0)
Hemoglobin: 14.7 g/dL (ref 13.2–17.1)
Lymphs Abs: 1445 cells/uL (ref 850–3900)
MCH: 29.8 pg (ref 27.0–33.0)
MCHC: 33.5 g/dL (ref 32.0–36.0)
MCV: 89 fL (ref 80.0–100.0)
MPV: 9.5 fL (ref 7.5–12.5)
Monocytes Relative: 11.4 %
Neutro Abs: 3041 cells/uL (ref 1500–7800)
Neutrophils Relative %: 54.3 %
Platelets: 307 10*3/uL (ref 140–400)
RBC: 4.93 10*6/uL (ref 4.20–5.80)
RDW: 13 % (ref 11.0–15.0)
Total Lymphocyte: 25.8 %
WBC: 5.6 10*3/uL (ref 3.8–10.8)

## 2019-05-21 LAB — URINALYSIS, ROUTINE W REFLEX MICROSCOPIC
Bilirubin Urine: NEGATIVE
Glucose, UA: NEGATIVE
Hgb urine dipstick: NEGATIVE
Ketones, ur: NEGATIVE
Leukocytes,Ua: NEGATIVE
Nitrite: NEGATIVE
Protein, ur: NEGATIVE
Specific Gravity, Urine: 1.022 (ref 1.001–1.03)
pH: 5.5 (ref 5.0–8.0)

## 2019-05-21 LAB — COMPLETE METABOLIC PANEL WITH GFR
AG Ratio: 2.1 (calc) (ref 1.0–2.5)
ALT: 29 U/L (ref 9–46)
AST: 29 U/L (ref 10–35)
Albumin: 4.9 g/dL (ref 3.6–5.1)
Alkaline phosphatase (APISO): 34 U/L — ABNORMAL LOW (ref 35–144)
BUN: 15 mg/dL (ref 7–25)
CO2: 30 mmol/L (ref 20–32)
Calcium: 10.6 mg/dL — ABNORMAL HIGH (ref 8.6–10.3)
Chloride: 102 mmol/L (ref 98–110)
Creat: 0.98 mg/dL (ref 0.70–1.25)
GFR, Est African American: 93 mL/min/{1.73_m2} (ref >=60)
GFR, Est Non African American: 80 mL/min/{1.73_m2} (ref >=60)
Globulin: 2.3 g/dL (calc) (ref 1.9–3.7)
Glucose, Bld: 104 mg/dL — ABNORMAL HIGH (ref 65–99)
Potassium: 4.1 mmol/L (ref 3.5–5.3)
Sodium: 140 mmol/L (ref 135–146)
Total Bilirubin: 0.6 mg/dL (ref 0.2–1.2)
Total Protein: 7.2 g/dL (ref 6.1–8.1)

## 2019-05-21 LAB — MAGNESIUM: Magnesium: 2 mg/dL (ref 1.5–2.5)

## 2019-05-21 LAB — LIPID PANEL
Cholesterol: 129 mg/dL (ref <200)
HDL: 40 mg/dL (ref >=40)
LDL Cholesterol (Calc): 71 mg/dL (calc)
Non-HDL Cholesterol (Calc): 89 mg/dL (calc) (ref <130)
Total CHOL/HDL Ratio: 3.2 (calc) (ref <5.0)
Triglycerides: 97 mg/dL (ref <150)

## 2019-05-21 LAB — VITAMIN D 25 HYDROXY (VIT D DEFICIENCY, FRACTURES): Vit D, 25-Hydroxy: 35 ng/mL (ref 30–100)

## 2019-05-21 LAB — TSH: TSH: 2.59 mIU/L (ref 0.40–4.50)

## 2019-05-21 LAB — MICROALBUMIN / CREATININE URINE RATIO
Creatinine, Urine: 204 mg/dL (ref 20–320)
Microalb Creat Ratio: 14 mcg/mg creat (ref <30)
Microalb, Ur: 2.8 mg/dL

## 2019-05-21 LAB — HEMOGLOBIN A1C
Hgb A1c MFr Bld: 6.3 % of total Hgb — ABNORMAL HIGH (ref <5.7)
Mean Plasma Glucose: 134 (calc)
eAG (mmol/L): 7.4 (calc)

## 2019-07-01 ENCOUNTER — Other Ambulatory Visit: Payer: Self-pay | Admitting: Adult Health Nurse Practitioner

## 2019-07-01 ENCOUNTER — Other Ambulatory Visit: Payer: Self-pay | Admitting: Cardiology

## 2019-07-01 DIAGNOSIS — I10 Essential (primary) hypertension: Secondary | ICD-10-CM

## 2019-07-11 ENCOUNTER — Other Ambulatory Visit: Payer: Self-pay | Admitting: Adult Health

## 2019-08-23 ENCOUNTER — Other Ambulatory Visit (HOSPITAL_COMMUNITY): Payer: Self-pay

## 2019-08-23 ENCOUNTER — Other Ambulatory Visit: Payer: Self-pay | Admitting: Adult Health

## 2019-08-23 DIAGNOSIS — R1310 Dysphagia, unspecified: Secondary | ICD-10-CM

## 2019-08-23 DIAGNOSIS — R0989 Other specified symptoms and signs involving the circulatory and respiratory systems: Secondary | ICD-10-CM

## 2019-08-28 ENCOUNTER — Other Ambulatory Visit (HOSPITAL_COMMUNITY): Payer: Self-pay

## 2019-08-28 DIAGNOSIS — R1310 Dysphagia, unspecified: Secondary | ICD-10-CM

## 2019-09-02 ENCOUNTER — Other Ambulatory Visit: Payer: Self-pay | Admitting: Adult Health

## 2019-09-02 DIAGNOSIS — R1310 Dysphagia, unspecified: Secondary | ICD-10-CM

## 2019-09-03 ENCOUNTER — Other Ambulatory Visit: Payer: Self-pay

## 2019-09-03 ENCOUNTER — Ambulatory Visit (HOSPITAL_COMMUNITY)
Admission: RE | Admit: 2019-09-03 | Discharge: 2019-09-03 | Disposition: A | Discharge status: Home / Self Care | Payer: 59 | Source: Ambulatory Visit | Attending: Adult Health | Admitting: Adult Health

## 2019-09-03 ENCOUNTER — Ambulatory Visit (HOSPITAL_COMMUNITY): Admission: RE | Admit: 2019-09-03 | Payer: 59 | Source: Ambulatory Visit

## 2019-09-03 ENCOUNTER — Encounter (HOSPITAL_COMMUNITY): Payer: Self-pay

## 2019-09-03 ENCOUNTER — Other Ambulatory Visit: Payer: Self-pay | Admitting: Adult Health

## 2019-09-03 DIAGNOSIS — R1310 Dysphagia, unspecified: Secondary | ICD-10-CM

## 2019-09-03 DIAGNOSIS — R0989 Other specified symptoms and signs involving the circulatory and respiratory systems: Secondary | ICD-10-CM

## 2019-09-04 ENCOUNTER — Other Ambulatory Visit: Payer: Self-pay | Admitting: Adult Health

## 2019-09-04 DIAGNOSIS — K449 Diaphragmatic hernia without obstruction or gangrene: Secondary | ICD-10-CM | POA: Insufficient documentation

## 2019-09-04 DIAGNOSIS — K222 Esophageal obstruction: Secondary | ICD-10-CM

## 2019-09-04 HISTORY — DX: Esophageal obstruction: K22.2

## 2019-09-04 MED ORDER — OMEPRAZOLE 40 MG PO CPDR: 40.0000 mg | DELAYED_RELEASE_CAPSULE | Freq: Every evening | ORAL | 1 refills | Status: DC

## 2019-09-05 NOTE — Progress Notes (Signed)
FOLLOW UP  Assessment and Plan:   Atherosclerosis of aorta Per CXR 07/2018 Control blood pressure, cholesterol, glucose, increase exercise.   Hypertension Off of several agents today with reasonable control; continue coreg, restart zestoretic D/c amlodipine, minoxidil; patient preference for fewer agents if possible; maximize above therapy Monitor blood pressure at home; patient to call if consistently greater than 130/80 Continue DASH diet.   Reminder to go to the ER if any CP, SOB, nausea, dizziness, severe HA, changes vision/speech, left arm numbness and tingling and jaw pain.  Hyperlipidemia associated with T2DM ([place]) Essentially at goal with rosuvastatin 40 mg daily  LDL goal <70 Continue low cholesterol diet and exercise.  Check lipid panel.   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  Check A1C  CKD II associated with T2DM ([facility]) Increase fluids, avoid NSAIDS, monitor sugars, will monitor  Overweight Long discussion about weight loss, diet, and exercise Recommended diet heavy in fruits and veggies and low in animal meats, cheeses, and dairy products, appropriate calorie intake Discussed ideal weight for height; weight loss goal <220lb Restart exercise, portion control Will d/c phenetamine for a while; lack of perceived benefit, wants to work on lifestyle Strategies discussed Will follow up in 3 months  Vitamin D Def Below goal at last visit; has increased dose continue supplementation to maintain goal of 60-100 Check Vit D level at CPE  GERD/esophageal stricture/hiatal hernia Continue PPI; GI referral placed to discuss EGD with  dilation Discussed small hiatal hernia will monitor unless severe sx not relieved   Continue diet and meds as discussed. Further disposition pending results of labs. Discussed med's effects and SE's.   Over 30 minutes of exam, counseling, chart review, and critical decision making was performed.   Future Appointments  Date Time Provider [place]  05/19/2020 10:00 AM Liane Comber, NP [facility] None    ----------------------------------------------------------------------------------------------------------------------  HPI 69 y.o. male  presents for 3 month follow up on hypertension, cholesterol, T2DM with CKD, ED, weight and vitamin D deficiency.   he has a diagnosis of GERD, was reporting dysphagia and odynophagia and recently underwent esophagram which demonstrated distal esophageal stricture and small hiatal hernia. Omeprazole was initiated. Denies breakthrough symptoms.  he is prescribed phentermine for weight loss, but has been off for several weeks.  While on the medication they have lost 0 lbs since last visit. They deny palpitations, anxiety, trouble sleeping, elevated BP.   BMI is Body mass index is 32.25 kg/m., he is working on diet and exercise. Eating healthy choice meals and smaller portions, out of Y since covid 19, has been working out daily at home. He drinks mainly water, refills 12 ounce cup x 6 daily =72 ounces.  He reports on home scale down from 238 to 226 lb, tracking weight, home scale body fat reading is 17-18%.  He is doing his best with exercise at home, weights; waist size down to 36.  Wt Readings from Last 3 Encounters:  09/09/19 237 lb 12.8 oz (107.9 kg)  05/20/19 228 lb 12.8 oz (103.8 kg)  03/21/19 225 lb (102.1 kg)   He is followed by Dr. 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  He had essentially normal ECHO in 07/2017 obtained for murmur evaluation; attributed to mild septal thickening.  He has aortic atherosclerosis  per CXR 07/2018.  His blood pressure has not been controlled at home, (has been taking carvediolol 12.5 mg BID, but has been out of lisinopril/hctz and amlodipine for 3 weeks, has been off of minoxidil for some time,  trying to see if still needs all agents) today their BP is BP: (!) 148/86  He does workout. He denies chest pain, shortness of breath, dizziness.   He is on cholesterol medication (rosuvastatin 40 mg daily) and denies myalgias. His cholesterol is essentially at goal. The cholesterol last visit was:   Lab Results  Component Value Date   CHOL 129 05/20/2019   HDL 40 05/20/2019   LDLCALC 71 05/20/2019   TRIG 97 05/20/2019   CHOLHDL 3.2 05/20/2019    He has been working on diet and exercise for T2 diabetes (A1C 6.5% in 10/2018, 6.6% in 02/2019) currently treated by lifestyle, and denies foot ulcerations, increased appetite, nausea, paresthesia of the feet, polydipsia, polyuria, visual disturbances, vomiting and weight loss. He has ED treated by sildenafil PRN. Last A1C in the office was:  Lab Results  Component Value Date   HGBA1C 6.3 (H) 05/20/2019    He has CKD II associated with T2DM monitored q67m at this office:  Lab Results  Component Value Date   GFRNONAA 80 05/20/2019   Patient is on Vitamin D supplement, taking 4000 IU daily, didn't increase previously due to calcium, now resolved.  Lab Results  Component Value Date   VD25OH 35 05/20/2019       Current Medications:  Current Outpatient Medications on File Prior to Visit  Medication Sig  . aspirin 81 MG tablet Take 81 mg by mouth daily.  . Black Pepper-Turmeric (TURMERIC COMPLEX/BLACK PEPPER PO) Take 500 mg by mouth daily.   . carvedilol (COREG) 12.5 MG tablet TAKE ONE TABLET BY MOUTH TWICE A DAY  . Cholecalciferol 50 MCG (2000 UT) CAPS Take 8,000 Units by mouth daily.  Marland Kitchen CINNAMON PO Take 1,000 mg by mouth daily.  Marland Kitchen gabapentin (NEURONTIN) 300 MG capsule Take 1 capsule (300 mg total) by mouth 3 (three) times daily as  needed.  Marland Kitchen omeprazole (PRILOSEC) 40 MG capsule Take 1 capsule (40 mg total) by mouth every evening. To reduce stomach acid.  Marland Kitchen OVER THE COUNTER MEDICATION APPLE CIDER VINEGAR - 1 TABLESPOON DAILY  . phentermine (ADIPEX-P) 37.5 MG tablet TAKE 1/2 TO 1 TABLET BY MOUTH EVERY MORNING FOR DIETING AND WEIGHT LOSS  . rosuvastatin (CRESTOR) 40 MG tablet Take 1 tablet Daily for Cholesterol  . sildenafil (VIAGRA) 100 MG tablet Take 1/4-1/2 tab as needed.  . vitamin C (ASCORBIC ACID) 500 MG tablet Take 500 mg by mouth daily.  . Zinc 50 MG TABS Take by mouth daily.   No current facility-administered medications on file prior to visit.     Allergies: No Known Allergies   Medical History:  Past Medical History:  Diagnosis Date  . Hyperlipidemia   . Hypertension    No sign of renal artery stenosis  . Vertigo    Family history- Reviewed and unchanged Social history- Reviewed and unchanged   Review of Systems:  Review of Systems  Constitutional: Negative for malaise/fatigue and weight loss.  HENT: Negative for hearing loss and tinnitus.   Eyes: Negative for blurred vision and double vision.  Respiratory: Negative for cough, shortness of breath and wheezing.   Cardiovascular: Negative  for chest pain, palpitations, orthopnea, claudication and leg swelling.  Gastrointestinal: Negative for abdominal pain, blood in stool, constipation, diarrhea, heartburn, melena, nausea and vomiting.  Genitourinary: Negative.   Musculoskeletal: Negative for joint pain and myalgias.  Skin: Negative for rash.  Neurological: Negative for dizziness, tingling, sensory change, weakness and headaches.  Endo/Heme/Allergies: Negative for polydipsia.  Psychiatric/Behavioral: Negative.   All other systems reviewed and are negative.   Physical Exam: BP (!) 148/86   Pulse 71   Temp 97.7 F (36.5 C)   Wt 237 lb 12.8 oz (107.9 kg)   SpO2 97%   BMI 32.25 kg/m  Wt Readings from Last 3 Encounters:  09/09/19 237 lb  12.8 oz (107.9 kg)  05/20/19 228 lb 12.8 oz (103.8 kg)  03/21/19 225 lb (102.1 kg)   General Appearance: Well nourished, in no apparent distress. Eyes: PERRLA, EOMs, conjunctiva no swelling or erythema Sinuses: No Frontal/maxillary tenderness ENT/Mouth: Ext aud canals clear, TMs without erythema, bulging. Mask in place; oral exam deferred. Hearing normal.  Neck: Supple, thyroid normal.  Respiratory: Respiratory effort normal, BS equal bilaterally without rales, rhonchi, wheezing or stridor.  Cardio: RRR with no MRGs. Brisk peripheral pulses without edema.  Abdomen: Soft, + BS.  Non tender, no guarding, rebound, hernias, masses. Lymphatics: Non tender without lymphadenopathy.  Musculoskeletal: Full ROM, 5/5 strength, Normal gait Skin: Warm, dry without rashes, lesions, ecchymosis.  Neuro: Cranial nerves intact. No cerebellar symptoms.  Psych: Awake and oriented X 3, normal affect, Insight and Judgment appropriate.    Izora Ribas, NP 9:11 AM [facility]

## 2019-09-09 ENCOUNTER — Encounter: Payer: Self-pay | Admitting: Adult Health

## 2019-09-09 ENCOUNTER — Ambulatory Visit: Payer: 59 | Admitting: Adult Health

## 2019-09-09 ENCOUNTER — Other Ambulatory Visit: Payer: Self-pay

## 2019-09-09 VITALS — BP 148/86 | HR 71 | Temp 97.7°F | Wt 237.8 lb

## 2019-09-09 DIAGNOSIS — E663 Overweight: Secondary | ICD-10-CM

## 2019-09-09 DIAGNOSIS — K222 Esophageal obstruction: Secondary | ICD-10-CM

## 2019-09-09 DIAGNOSIS — Z79899 Other long term (current) drug therapy: Secondary | ICD-10-CM

## 2019-09-09 DIAGNOSIS — Z1211 Encounter for screening for malignant neoplasm of colon: Secondary | ICD-10-CM

## 2019-09-09 DIAGNOSIS — N182 Chronic kidney disease, stage 2 (mild): Secondary | ICD-10-CM

## 2019-09-09 DIAGNOSIS — Z86010 Personal history of colonic polyps: Secondary | ICD-10-CM

## 2019-09-09 DIAGNOSIS — K219 Gastro-esophageal reflux disease without esophagitis: Secondary | ICD-10-CM

## 2019-09-09 DIAGNOSIS — E119 Type 2 diabetes mellitus without complications: Secondary | ICD-10-CM

## 2019-09-09 DIAGNOSIS — E1169 Type 2 diabetes mellitus with other specified complication: Secondary | ICD-10-CM

## 2019-09-09 DIAGNOSIS — E559 Vitamin D deficiency, unspecified: Secondary | ICD-10-CM

## 2019-09-09 DIAGNOSIS — I700 Atherosclerosis of aorta: Secondary | ICD-10-CM

## 2019-09-09 DIAGNOSIS — I10 Essential (primary) hypertension: Secondary | ICD-10-CM

## 2019-09-09 DIAGNOSIS — E1122 Type 2 diabetes mellitus with diabetic chronic kidney disease: Secondary | ICD-10-CM

## 2019-09-09 MED ORDER — LISINOPRIL-HYDROCHLOROTHIAZIDE 20-12.5 MG PO TABS: ORAL_TABLET | ORAL | 1 refills | Status: DC

## 2019-09-09 NOTE — Patient Instructions (Signed)
Goals    . Blood Pressure < 130/80    . DIET - INCREASE WATER INTAKE     Recommend 65-80 fluid ounces daily     . Exercise 150 min/wk Moderate Activity    . LDL CALC < 100    . Weight (lb) < 220 lb (99.8 kg)       Aim to lose 0.5-1.5 lb/week, check weight weekly and keep a log Recommend small sustainable lifestyle changes   Drink 1/2 your body weight in fluid ounces of water daily; drink a tall glass of water 30 min before meals  Don't eat until you're stuffed- listen to your stomach and eat until you are 80% full   Try eating off of a salad plate; wait 10 min after finishing before going back for seconds  Start by eating the vegetables on your plate; aim for 50% of your meals to be fruits or vegetables  Then eat your protein - lean meats (grass fed if possible), fish, beans, nuts in moderation  Eat your carbs/starch last ONLY if you still are hungry. If you can, stop before finishing it all  Avoid sugar and flour - the closer it looks to it's original form in nature, typically the better it is for you  Splurge in moderation - "assign" days when you get to splurge and have the "bad stuff" - I like to follow a 80% - 20% plan- "good" choices 80 % of the time, "bad" choices in moderation 20% of the time  Simple equation is: Calories out > calories in = weight loss - even if you eat the bad stuff, if you limit portions, you will still lose weight    HYPERTENSION INFORMATION  Monitor your blood pressure at home, please keep a record and bring that in with you to your next office visit.   Go to the ER if any CP, SOB, nausea, dizziness, severe HA, changes vision/speech  Testing/Procedures: HOW TO TAKE YOUR BLOOD PRESSURE:  Rest 5 minutes before taking your blood pressure.  Don't smoke or drink caffeinated beverages for at least 30 minutes before.  Take your blood pressure before (not after) you eat.  Sit comfortably with your back supported and both feet on the floor (don't  cross your legs).  Elevate your arm to heart level on a table or a desk.  Use the proper sized cuff. It should fit smoothly and snugly around your bare upper arm. There should be enough room to slip a fingertip under the cuff. The bottom edge of the cuff should be 1 inch above the crease of the elbow.  Your most recent BP: BP: (!) 148/86   Take your medications faithfully as instructed. Maintain a healthy weight. Get at least 150 minutes of aerobic exercise per week. Minimize salt intake. Minimize alcohol intake  DASH Eating Plan DASH stands for "Dietary Approaches to Stop Hypertension." The DASH eating plan is a healthy eating plan that has been shown to reduce high blood pressure (hypertension). Additional health benefits may include reducing the risk of type 2 diabetes mellitus, heart disease, and stroke. The DASH eating plan may also help with weight loss. WHAT DO I NEED TO KNOW ABOUT THE DASH EATING PLAN? For the DASH eating plan, you will follow these general guidelines:  Choose foods with a percent daily value for sodium of less than 5% (as listed on the food label).  Use salt-free seasonings or herbs instead of table salt or sea salt.  Check with your  health care provider or pharmacist before using salt substitutes.  Eat lower-sodium products, often labeled as "lower sodium" or "no salt added."  Eat fresh foods.  Eat more vegetables, fruits, and low-fat dairy products.  Choose whole grains. Look for the word "whole" as the first word in the ingredient list.  Choose fish and skinless chicken or [place] more often than red meat. Limit fish, poultry, and meat to 6 oz (170 g) each day.  Limit sweets, desserts, sugars, and sugary drinks.  Choose heart-healthy fats.  Limit cheese to 1 oz (28 g) per day.  Eat more home-cooked food and less restaurant, buffet, and fast food.  Limit fried foods.  Cook foods using methods other than frying.  Limit canned vegetables. If you  do use them, rinse them well to decrease the sodium.  When eating at a restaurant, ask that your food be prepared with less salt, or no salt if possible. WHAT FOODS CAN I EAT? Seek help from a dietitian for individual calorie needs. Grains Whole grain or whole wheat bread. Brown rice. Whole grain or whole wheat pasta. Quinoa, bulgur, and whole grain cereals. Low-sodium cereals. Corn or whole wheat flour tortillas. Whole grain cornbread. Whole grain crackers. Low-sodium crackers. Vegetables Fresh or frozen vegetables (raw, steamed, roasted, or grilled). Low-sodium or reduced-sodium tomato and vegetable juices. Low-sodium or reduced-sodium tomato sauce and paste. Low-sodium or reduced-sodium canned vegetables.  Fruits All fresh, canned (in natural juice), or frozen fruits. Meat and Other Protein Products Ground beef (85% or leaner), grass-fed beef, or beef trimmed of fat. Skinless chicken or [place]. Ground chicken or [place]. Pork trimmed of fat. All fish and seafood. Eggs. Dried beans, peas, or lentils. Unsalted nuts and seeds. Unsalted canned beans. Dairy Low-fat dairy products, such as skim or 1% milk, 2% or reduced-fat cheeses, low-fat ricotta or cottage cheese, or plain low-fat yogurt. Low-sodium or reduced-sodium cheeses. Fats and Oils Tub margarines without trans fats. Light or reduced-fat mayonnaise and salad dressings (reduced sodium). Avocado. Safflower, olive, or canola oils. Natural peanut or almond butter. Other Unsalted popcorn and pretzels. The items listed above may not be a complete list of recommended foods or beverages. Contact your dietitian for more options. WHAT FOODS ARE NOT RECOMMENDED? Grains White bread. White pasta. White rice. Refined cornbread. Bagels and croissants. Crackers that contain trans fat. Vegetables Creamed or fried vegetables. Vegetables in a cheese sauce. Regular canned vegetables. Regular canned tomato sauce and paste. Regular tomato and vegetable  juices. Fruits Dried fruits. Canned fruit in light or heavy syrup. Fruit juice. Meat and Other Protein Products Fatty cuts of meat. Ribs, chicken wings, bacon, sausage, bologna, salami, chitterlings, fatback, hot dogs, bratwurst, and packaged luncheon meats. Salted nuts and seeds. Canned beans with salt. Dairy Whole or 2% milk, cream, half-and-half, and cream cheese. Whole-fat or sweetened yogurt. Full-fat cheeses or blue cheese. Nondairy creamers and whipped toppings. Processed cheese, cheese spreads, or cheese curds. Condiments Onion and garlic salt, seasoned salt, table salt, and sea salt. Canned and packaged gravies. Worcestershire sauce. Tartar sauce. Barbecue sauce. Teriyaki sauce. Soy sauce, including reduced sodium. Steak sauce. Fish sauce. Oyster sauce. Cocktail sauce. Horseradish. Ketchup and mustard. Meat flavorings and tenderizers. Bouillon cubes. Hot sauce. Tabasco sauce. Marinades. Taco seasonings. Relishes. Fats and Oils Butter, stick margarine, lard, shortening, ghee, and bacon fat. Coconut, palm kernel, or palm oils. Regular salad dressings. Other Pickles and olives. Salted popcorn and pretzels. The items listed above may not be a complete list of foods and beverages to  avoid. Contact your dietitian for more information. WHERE CAN I FIND MORE INFORMATION? National Heart, Lung, and Blood Institute: travelstabloid.com Document Released: 06/09/2011 Document Revised: 11/04/2013 Document Reviewed: 04/24/2013 [facility] Patient Information 2015 [place], [place]. This information is not intended to replace advice given to you by your health care provider. Make sure you discuss any questions you have with your health care provider.

## 2019-09-10 ENCOUNTER — Other Ambulatory Visit: Payer: Self-pay | Admitting: Adult Health

## 2019-09-10 DIAGNOSIS — R748 Abnormal levels of other serum enzymes: Secondary | ICD-10-CM

## 2019-09-10 DIAGNOSIS — D649 Anemia, unspecified: Secondary | ICD-10-CM

## 2019-09-10 LAB — CBC WITH DIFFERENTIAL/PLATELET
Absolute Monocytes: 692 cells/uL (ref 200–950)
Basophils Absolute: 78 cells/uL (ref 0–200)
Basophils Relative: 1.5 %
Eosinophils Absolute: 380 cells/uL (ref 15–500)
Eosinophils Relative: 7.3 %
HCT: 38.9 % (ref 38.5–50.0)
Hemoglobin: 12.9 g/dL — ABNORMAL LOW (ref 13.2–17.1)
Lymphs Abs: 1050 cells/uL (ref 850–3900)
MCH: 29.6 pg (ref 27.0–33.0)
MCHC: 33.2 g/dL (ref 32.0–36.0)
MCV: 89.2 fL (ref 80.0–100.0)
MPV: 9.4 fL (ref 7.5–12.5)
Monocytes Relative: 13.3 %
Neutro Abs: 3000 cells/uL (ref 1500–7800)
Neutrophils Relative %: 57.7 %
Platelets: 280 10*3/uL (ref 140–400)
RBC: 4.36 10*6/uL (ref 4.20–5.80)
RDW: 13 % (ref 11.0–15.0)
Total Lymphocyte: 20.2 %
WBC: 5.2 10*3/uL (ref 3.8–10.8)

## 2019-09-10 LAB — TSH: TSH: 2.32 mIU/L (ref 0.40–4.50)

## 2019-09-10 LAB — COMPLETE METABOLIC PANEL WITH GFR
AG Ratio: 2 (calc) (ref 1.0–2.5)
ALT: 30 U/L (ref 9–46)
AST: 41 U/L — ABNORMAL HIGH (ref 10–35)
Albumin: 4.4 g/dL (ref 3.6–5.1)
Alkaline phosphatase (APISO): 33 U/L — ABNORMAL LOW (ref 35–144)
BUN: 15 mg/dL (ref 7–25)
CO2: 30 mmol/L (ref 20–32)
Calcium: 9.9 mg/dL (ref 8.6–10.3)
Chloride: 106 mmol/L (ref 98–110)
Creat: 0.93 mg/dL (ref 0.70–1.25)
GFR, Est African American: 98 mL/min/{1.73_m2} (ref >=60)
GFR, Est Non African American: 85 mL/min/{1.73_m2} (ref >=60)
Globulin: 2.2 g/dL (calc) (ref 1.9–3.7)
Glucose, Bld: 118 mg/dL — ABNORMAL HIGH (ref 65–99)
Potassium: 4.7 mmol/L (ref 3.5–5.3)
Sodium: 140 mmol/L (ref 135–146)
Total Bilirubin: 0.5 mg/dL (ref 0.2–1.2)
Total Protein: 6.6 g/dL (ref 6.1–8.1)

## 2019-09-10 LAB — LIPID PANEL
Cholesterol: 127 mg/dL (ref <200)
HDL: 44 mg/dL (ref >=40)
LDL Cholesterol (Calc): 68 mg/dL (calc)
Non-HDL Cholesterol (Calc): 83 mg/dL (calc) (ref <130)
Total CHOL/HDL Ratio: 2.9 (calc) (ref <5.0)
Triglycerides: 72 mg/dL (ref <150)

## 2019-09-10 LAB — HEMOGLOBIN A1C
Hgb A1c MFr Bld: 6.4 % of total Hgb — ABNORMAL HIGH (ref <5.7)
Mean Plasma Glucose: 137 (calc)
eAG (mmol/L): 7.6 (calc)

## 2019-09-10 LAB — MAGNESIUM: Magnesium: 2.2 mg/dL (ref 1.5–2.5)

## 2019-09-12 ENCOUNTER — Encounter: Payer: Self-pay | Admitting: Internal Medicine

## 2019-09-16 ENCOUNTER — Encounter (INDEPENDENT_AMBULATORY_CARE_PROVIDER_SITE_OTHER): Payer: Self-pay

## 2019-09-16 ENCOUNTER — Other Ambulatory Visit: Payer: Self-pay | Admitting: Adult Health

## 2019-09-16 DIAGNOSIS — I10 Essential (primary) hypertension: Secondary | ICD-10-CM

## 2019-09-16 MED ORDER — AMLODIPINE BESYLATE 10 MG PO TABS: ORAL_TABLET | ORAL | 1 refills | Status: DC

## 2019-09-20 ENCOUNTER — Other Ambulatory Visit: Payer: Self-pay | Admitting: Adult Health

## 2019-09-20 MED ORDER — ONDANSETRON HCL 4 MG PO TABS: 4.0000 mg | ORAL_TABLET | Freq: Three times a day (TID) | ORAL | 0 refills | Status: DC | PRN

## 2019-09-29 ENCOUNTER — Other Ambulatory Visit: Payer: Self-pay | Admitting: Cardiology

## 2019-10-02 ENCOUNTER — Other Ambulatory Visit: Payer: Self-pay | Admitting: Adult Health

## 2019-10-02 MED ORDER — OLMESARTAN MEDOXOMIL-HCTZ 40-25 MG PO TABS: 1.0000 | ORAL_TABLET | Freq: Every day | ORAL | 1 refills | Status: DC

## 2019-10-08 ENCOUNTER — Other Ambulatory Visit: Payer: 59

## 2019-10-14 ENCOUNTER — Encounter: Payer: Self-pay | Admitting: *Deleted

## 2019-10-15 ENCOUNTER — Other Ambulatory Visit: Payer: Self-pay

## 2019-10-15 ENCOUNTER — Other Ambulatory Visit: Payer: 59

## 2019-10-15 DIAGNOSIS — R748 Abnormal levels of other serum enzymes: Secondary | ICD-10-CM

## 2019-10-15 DIAGNOSIS — D649 Anemia, unspecified: Secondary | ICD-10-CM

## 2019-10-15 LAB — HEPATIC FUNCTION PANEL
AG Ratio: 2 (calc) (ref 1.0–2.5)
ALT: 30 U/L (ref 9–46)
AST: 29 U/L (ref 10–35)
Albumin: 4.3 g/dL (ref 3.6–5.1)
Alkaline phosphatase (APISO): 37 U/L (ref 35–144)
Bilirubin, Direct: 0.1 mg/dL (ref 0.0–0.2)
Globulin: 2.2 g/dL (calc) (ref 1.9–3.7)
Indirect Bilirubin: 0.3 mg/dL (calc) (ref 0.2–1.2)
Total Bilirubin: 0.4 mg/dL (ref 0.2–1.2)
Total Protein: 6.5 g/dL (ref 6.1–8.1)

## 2019-10-15 LAB — CBC WITH DIFFERENTIAL/PLATELET
Absolute Monocytes: 626 cells/uL (ref 200–950)
Basophils Absolute: 103 cells/uL (ref 0–200)
Basophils Relative: 1.9 %
Eosinophils Absolute: 410 cells/uL (ref 15–500)
Eosinophils Relative: 7.6 %
HCT: 40.9 % (ref 38.5–50.0)
Hemoglobin: 13.5 g/dL (ref 13.2–17.1)
Lymphs Abs: 1166 cells/uL (ref 850–3900)
MCH: 29.5 pg (ref 27.0–33.0)
MCHC: 33 g/dL (ref 32.0–36.0)
MCV: 89.3 fL (ref 80.0–100.0)
MPV: 9.3 fL (ref 7.5–12.5)
Monocytes Relative: 11.6 %
Neutro Abs: 3094 cells/uL (ref 1500–7800)
Neutrophils Relative %: 57.3 %
Platelets: 307 10*3/uL (ref 140–400)
RBC: 4.58 10*6/uL (ref 4.20–5.80)
RDW: 12.9 % (ref 11.0–15.0)
Total Lymphocyte: 21.6 %
WBC: 5.4 10*3/uL (ref 3.8–10.8)

## 2019-10-16 ENCOUNTER — Encounter: Payer: Self-pay | Admitting: Internal Medicine

## 2019-10-16 ENCOUNTER — Ambulatory Visit: Payer: 59 | Admitting: Internal Medicine

## 2019-10-16 VITALS — BP 114/60 | HR 74 | Temp 97.8°F | Ht 73.0 in | Wt 231.2 lb

## 2019-10-16 DIAGNOSIS — R1310 Dysphagia, unspecified: Secondary | ICD-10-CM

## 2019-10-16 DIAGNOSIS — K222 Esophageal obstruction: Secondary | ICD-10-CM | POA: Diagnosis not present

## 2019-10-16 DIAGNOSIS — Z1211 Encounter for screening for malignant neoplasm of colon: Secondary | ICD-10-CM | POA: Diagnosis not present

## 2019-10-16 DIAGNOSIS — Z01818 Encounter for other preprocedural examination: Secondary | ICD-10-CM

## 2019-10-16 DIAGNOSIS — K219 Gastro-esophageal reflux disease without esophagitis: Secondary | ICD-10-CM

## 2019-10-16 MED ORDER — SUPREP BOWEL PREP KIT 17.5-3.13-1.6 GM/177ML PO SOLN: 1.0000 | ORAL | 0 refills | Status: DC

## 2019-10-16 NOTE — Progress Notes (Signed)
Patient ID: Nathaniel Gill, male   DOB: Sep 02, 1950, 69 y.o.   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  He is here alone today.  He reports that he has been dealing with dysphagia symptom dating back to 2008 but recently has had more trouble with food sticking.  He notices this with food such as rice and barbecue.  Couple of weeks ago he describes a particularly bad issue where for 30 to 45 minutes he could not swallow and had to induce vomiting and eventually cleared his esophagus.  During some of these periods he has issues with breathing and feels like he is choking.  One time his wife performed Heimlich maneuver.  He does not have much heartburn but does of occasionally feel acid,.  He took omeprazole for years but had been off of this for some time.  About a month ago primary care started him back on omeprazole 40 mg a day.  He has had no further acid indigestion since this was restarted.  He does have some upper gas and belching.  Bowel movements for the most part are regular though he can have constipation.  Exercise helps with his regular bowel movement.  Number of years ago again while living in [place] around 2008 he had issues with IBS which sounds like for him was constipation and vagal response with diaphoresis and near syncope while trying to have bowel movements.  At other times bowel movements could be watery.  This has not been an issue of late.  He has had prior colonoscopy x2 with the last about 10 years ago.  He reports he always has a polyp or 2 but have never been cancerous.  Past Medical History:  Diagnosis Date  . Arthritis   . DDD (degenerative disc disease), lumbar   . ED (erectile dysfunction)   . GERD (gastroesophageal reflux  disease)   . Hiatal hernia   . History of colon polyps   . Hyperlipidemia   . Hypertension    No sign of renal artery stenosis  . IBS (irritable bowel syndrome)   . RBBB   . Systolic murmur   . Vertigo   . Vitamin D deficiency     Past Surgical History:  Procedure Laterality Date  . ABDOMINAL SURGERY  1956   Related to bleeding episode.   Marland Kitchen CARDIAC CATHETERIZATION  2005   no intervention per patient  . COLONOSCOPY     x5 per patient. Last one 2010-2012 in [place]  . CYST REMOVAL HAND Left 1975   [place]  . SKIN LESION EXCISION    . STAPEDECTOMY Bilateral   . TRANSTHORACIC ECHOCARDIOGRAM  07/2017   Normal LV size and function.  EF 55-60%.  No RWMA.  Basal septal hypertrophy, but no sign of significant hypertensive heart disease.  Only GR 1 DD.  Marland Kitchen TRIGGER FINGER RELEASE Right 2014   [place]    Outpatient Medications Prior to Visit  Medication Sig Dispense Refill  . amLODipine (NORVASC) 10 MG tablet Take 1 tablet Daily for BP 90 tablet 1  . aspirin 81 MG tablet Take 81 mg by mouth daily.    . Black Pepper-Turmeric (TURMERIC COMPLEX/BLACK PEPPER PO) Take 500 mg by mouth daily.     . carvedilol (COREG) 12.5 MG tablet TAKE  ONE TABLET BY MOUTH TWICE A DAY 180 tablet 0  . Cholecalciferol 50 MCG (2000 UT) CAPS Take 8,000 Units by mouth daily.    Marland Kitchen gabapentin (NEURONTIN) 300 MG capsule Take 1 capsule (300 mg total) by mouth 3 (three) times daily as needed. 90 capsule 1  . olmesartan-hydrochlorothiazide (BENICAR HCT) 40-25 MG tablet Take 1 tablet by mouth daily. 90 tablet 1  . omeprazole (PRILOSEC) 40 MG capsule Take 1 capsule (40 mg total) by mouth every evening. To reduce stomach acid. 90 capsule 1  . ondansetron (ZOFRAN) 4 MG tablet Take 1 tablet (4 mg total) by mouth every 8 (eight) hours as needed for nausea or vomiting. 30 tablet 0  . rosuvastatin (CRESTOR) 40 MG tablet Take 1 tablet Daily for Cholesterol 90 tablet 1  . sildenafil (VIAGRA) 100 MG tablet Take 1/4-1/2 tab as  needed. 30 tablet 1  . vitamin C (ASCORBIC ACID) 500 MG tablet Take 500 mg by mouth daily.    . Zinc 50 MG TABS Take 1 tablet by mouth daily.     Marland Kitchen CINNAMON PO Take 1,000 mg by mouth daily.    Marland Kitchen OVER THE COUNTER MEDICATION APPLE CIDER VINEGAR - 1 TABLESPOON DAILY     No facility-administered medications prior to visit.    No Known Allergies  Family History  Problem Relation Age of Onset  . Heart disease Mother        Atrial flutter  . COPD Mother   . Heart failure Mother   . Kidney disease Father   . Arrhythmia Brother   . Heart attack Brother   . Hypertension Brother   . Hyperlipidemia Brother   . Hypertension Brother   . Hyperlipidemia Brother   . Esophageal cancer Neg Hx   . Colon cancer Neg Hx     Social History   Tobacco Use  . Smoking status: Never Smoker  . Smokeless tobacco: Never Used  Substance Use Topics  . Alcohol use: Yes    Comment: Social occasional - once a month  . Drug use: No    Comment: In 61s - cocaine, marijuana    ROS: As per history of present illness, otherwise negative  BP 114/60   Pulse 74   Temp 97.8 F (36.6 C)   Ht 6\' 1"  (1.854 m)   Wt 231 lb 4 oz (104.9 kg)   BMI 30.51 kg/m  Gen: awake, alert, NAD HEENT: anicteric, CV: RRR, no mrg Pulm: CTA b/l Abd: soft, NT/ND, +BS throughout Ext: no c/c/e Neuro: nonfocal   RELEVANT LABS AND IMAGING: CBC    Component Value Date/Time   WBC 5.4 10/15/2019 0902   RBC 4.58 10/15/2019 0902   HGB 13.5 10/15/2019 0902   HCT 40.9 10/15/2019 0902   PLT 307 10/15/2019 0902   MCV 89.3 10/15/2019 0902   MCH 29.5 10/15/2019 0902   MCHC 33.0 10/15/2019 0902   RDW 12.9 10/15/2019 0902   LYMPHSABS 1,166 10/15/2019 0902   EOSABS 410 10/15/2019 0902   BASOSABS 103 10/15/2019 0902    CMP     Component Value Date/Time   NA 140 09/09/2019 0929   K 4.7 09/09/2019 0929   CL 106 09/09/2019 0929   CO2 30 09/09/2019 0929   GLUCOSE 118 (H) 09/09/2019 0929   BUN 15 09/09/2019 0929   CREATININE  0.93 09/09/2019 0929   CALCIUM 9.9 09/09/2019 0929   PROT 6.5 10/15/2019 0902   AST 29 10/15/2019 0902   ALT 30 10/15/2019 0902  BILITOT 0.4 10/15/2019 0902   GFRNONAA 85 09/09/2019 0929   GFRAA 98 09/09/2019 0929   ESOPHOGRAM / BARIUM SWALLOW / BARIUM TABLET STUDY   TECHNIQUE: Combined double contrast and single contrast examination performed using effervescent crystals, thick barium liquid, and thin barium liquid. The patient was observed with fluoroscopy swallowing a 13 mm barium sulphate tablet.   FLUOROSCOPY TIME:  Fluoroscopy Time:  2 minutes 24 seconds   Radiation Exposure Index (if provided by the fluoroscopic device): 47.1 mGy   Number of Acquired Spot Images: 0   COMPARISON:  None.   FINDINGS: Fluoroscopic evaluation of swallowing demonstrates normal esophageal peristalsis. There is a small hiatal hernia. Distal esophageal mucosal ring noted. The 13 mm barium tablet briefly sticks in this area and reproduces the patient's symptoms.   No fold thickening.  No reflux with the water siphon maneuver.   IMPRESSION: Small hiatal hernia.   Moderate distal esophageal mucosal ring which causes brief sticking of the 13 mm barium tablet and reproduces the patient's symptoms.     Electronically Signed   By: Rolm Baptise M.D.   On: 09/03/2019 14:13    ASSESSMENT/PLAN: 69 year old male with a history of GERD, colon polyps, Meckel's diverticulum causing GI hemorrhage at age 49 status post surgical resection, hypertension, hyperlipidemia who is seen to evaluate dysphagia and consider colon cancer screening.  1.  GERD and dysphagia/abnormal barium esophagram --we discussed barium esophagram results from last month which show small hiatal hernia but also a distal esophageal stricture/ring which transiently held a 13 mm barium tablet from passing.  He has been started back on PPI and has had no reflux symptoms.  I recommended upper endoscopy with probable dilation.  We  discussed the risk, benefits and alternatives and he is agreeable and wishes to proceed --Continue omeprazole 40 mg daily --Upper endoscopy in the [facility]  2.  CRC screening --history of colon polyps, unknown histology.  Screening colonoscopy recommended at this time.  We discussed the risk, benefits and alternatives and he is agreeable and wishes to proceed      [id number], Caryl Pina, [address] [place],  [place] [id number]

## 2019-10-16 NOTE — Patient Instructions (Signed)
You have been scheduled for an endoscopy and colonoscopy. Please follow the written instructions given to you at your visit today. Please pick up your prep supplies at the pharmacy within the next 1-3 days. If you use inhalers (even only as needed), please bring them with you on the day of your procedure. Your physician has requested that you go to www.startemmi.com and enter the access code given to you at your visit today. This web site gives a general overview about your procedure. However, you should still follow specific instructions given to you by our office regarding your preparation for the procedure.  Continue omeprazole.  If you are age 93 or older, your body mass index should be between 23-30. Your Body mass index is 30.51 kg/m. If this is out of the aforementioned range listed, please consider follow up with your Primary Care Provider.  If you are age 37 or younger, your body mass index should be between 19-25. Your Body mass index is 30.51 kg/m. If this is out of the aformentioned range listed, please consider follow up with your Primary Care Provider.   Due to recent changes in healthcare laws, you may see the results of your imaging and laboratory studies on MyChart before your provider has had a chance to review them.  We understand that in some cases there may be results that are confusing or concerning to you. Not all laboratory results come back in the same time frame and the provider may be waiting for multiple results in order to interpret others.  Please give [place] 48 hours in order for your provider to thoroughly review all the results before contacting the office for clarification of your results.

## 2019-11-13 ENCOUNTER — Encounter: Payer: Self-pay | Admitting: Internal Medicine

## 2019-11-19 ENCOUNTER — Other Ambulatory Visit: Payer: Self-pay | Admitting: Internal Medicine

## 2019-11-19 ENCOUNTER — Ambulatory Visit (INDEPENDENT_AMBULATORY_CARE_PROVIDER_SITE_OTHER): Payer: 59

## 2019-11-19 ENCOUNTER — Other Ambulatory Visit: Payer: Self-pay

## 2019-11-19 DIAGNOSIS — Z1159 Encounter for screening for other viral diseases: Secondary | ICD-10-CM

## 2019-11-19 LAB — SARS CORONAVIRUS 2 (TAT 6-24 HRS): SARS Coronavirus 2: NEGATIVE

## 2019-11-22 ENCOUNTER — Ambulatory Visit (AMBULATORY_SURGERY_CENTER): Payer: 59 | Admitting: Internal Medicine

## 2019-11-22 ENCOUNTER — Encounter: Payer: Self-pay | Admitting: Internal Medicine

## 2019-11-22 VITALS — BP 118/67 | HR 61 | Temp 96.8°F | Resp 13 | Ht 73.0 in | Wt 231.0 lb

## 2019-11-22 DIAGNOSIS — K222 Esophageal obstruction: Secondary | ICD-10-CM

## 2019-11-22 DIAGNOSIS — D124 Benign neoplasm of descending colon: Secondary | ICD-10-CM

## 2019-11-22 DIAGNOSIS — Z1211 Encounter for screening for malignant neoplasm of colon: Secondary | ICD-10-CM

## 2019-11-22 DIAGNOSIS — K31811 Angiodysplasia of stomach and duodenum with bleeding: Secondary | ICD-10-CM | POA: Diagnosis not present

## 2019-11-22 DIAGNOSIS — R1310 Dysphagia, unspecified: Secondary | ICD-10-CM

## 2019-11-22 DIAGNOSIS — D122 Benign neoplasm of ascending colon: Secondary | ICD-10-CM | POA: Diagnosis not present

## 2019-11-22 DIAGNOSIS — K449 Diaphragmatic hernia without obstruction or gangrene: Secondary | ICD-10-CM

## 2019-11-22 DIAGNOSIS — D125 Benign neoplasm of sigmoid colon: Secondary | ICD-10-CM

## 2019-11-22 DIAGNOSIS — K317 Polyp of stomach and duodenum: Secondary | ICD-10-CM | POA: Diagnosis not present

## 2019-11-22 DIAGNOSIS — Z86010 Personal history of colonic polyps: Secondary | ICD-10-CM | POA: Diagnosis not present

## 2019-11-22 DIAGNOSIS — R1319 Other dysphagia: Secondary | ICD-10-CM

## 2019-11-22 DIAGNOSIS — R933 Abnormal findings on diagnostic imaging of other parts of digestive tract: Secondary | ICD-10-CM

## 2019-11-22 MED ORDER — SODIUM CHLORIDE 0.9 % IV SOLN: 500.0000 mL | Freq: Once | INTRAVENOUS | Status: DC

## 2019-11-22 NOTE — Patient Instructions (Signed)
Handouts provided:  Post-Dilation diet and Polyps  YOU HAD AN ENDOSCOPIC PROCEDURE TODAY AT [facility]:   Refer to the procedure report that was given to you for any specific questions about what was found during the examination.  If the procedure report does not answer your questions, please call your gastroenterologist to clarify.  If you requested that your care partner not be given the details of your procedure findings, then the procedure report has been included in a sealed envelope for you to review at your convenience later.  YOU SHOULD EXPECT: Some feelings of bloating in the abdomen. Passage of more gas than usual.  Walking can help get rid of the air that was put into your GI tract during the procedure and reduce the bloating. If you had a lower endoscopy (such as a colonoscopy or flexible sigmoidoscopy) you may notice spotting of blood in your stool or on the toilet paper. If you underwent a bowel prep for your procedure, you may not have a normal bowel movement for a few days.  Please Note:  You might notice some irritation and congestion in your nose or some drainage.  This is from the oxygen used during your procedure.  There is no need for concern and it should clear up in a day or so.  SYMPTOMS TO REPORT IMMEDIATELY:   Following lower endoscopy (colonoscopy or flexible sigmoidoscopy):  Excessive amounts of blood in the stool  Significant tenderness or worsening of abdominal pains  Swelling of the abdomen that is new, acute  Fever of 100F or higher   Following upper endoscopy (EGD)  Vomiting of blood or coffee ground material  New chest pain or pain under the shoulder blades  Painful or persistently difficult swallowing  New shortness of breath  Fever of 100F or higher  Black, tarry-looking stools  For urgent or emergent issues, a gastroenterologist can be reached at any hour by calling [phone]. Do not use MyChart messaging for urgent concerns.     DIET:  See Post- Dilation Diet provided.   Drink plenty of fluids but you should avoid alcoholic beverages for 24 hours.  ACTIVITY:  You should plan to take it easy for the rest of today and you should NOT DRIVE or use heavy machinery until tomorrow (because of the sedation medicines used during the test).    FOLLOW UP: Our staff will call the number listed on your records 48-72 hours following your procedure to check on you and address any questions or concerns that you may have regarding the information given to you following your procedure. If we do not reach you, we will leave a message.  We will attempt to reach you two times.  During this call, we will ask if you have developed any symptoms of COVID 19. If you develop any symptoms (ie: fever, flu-like symptoms, shortness of breath, cough etc.) before then, please call [phone].  If you test positive for Covid 19 in the 2 weeks post procedure, please call and report this information to [place].    If any biopsies were taken you will be contacted by phone or by letter within the next 1-3 weeks.  Please call us at [phone] if you have not heard about the biopsies in 3 weeks.    SIGNATURES/CONFIDENTIALITY: You and/or your care partner have signed paperwork which will be entered into your electronic medical record.  These signatures attest to the fact that that the information above on your After Visit  Summary has been reviewed and is understood.  Full responsibility of the confidentiality of this discharge information lies with you and/or your care-partner.

## 2019-11-22 NOTE — Progress Notes (Signed)
Pt's states no medical or surgical changes since previsit or office visit. 

## 2019-11-22 NOTE — Progress Notes (Signed)
Called to room to assist during endoscopic procedure.  Patient ID and intended procedure confirmed with present staff. Received instructions for my participation in the procedure from the performing physician.  

## 2019-11-22 NOTE — Op Note (Signed)
[facility] Patient Name: Nathaniel Gill Procedure Date: 11/22/2019 1:58 PM MRN: [id number] Endoscopist: Jerene Bears , MD Age: 69 Referring MD:  Date of Birth: 03-Feb-1951 Gender: Male Account #: [id number] Procedure:                Colonoscopy Indications:              High risk colon cancer surveillance: Personal                            history of colonic polyps, Last colonoscopy 10                            years ago Medicines:                Monitored Anesthesia Care Procedure:                Pre-Anesthesia Assessment:                           - Prior to the procedure, a History and Physical                            was performed, and patient medications and                            allergies were reviewed. The patient's tolerance of                            previous anesthesia was also reviewed. The risks                            and benefits of the procedure and the sedation                            options and risks were discussed with the patient.                            All questions were answered, and informed consent                            was obtained. Prior Anticoagulants: The patient has                            taken no previous anticoagulant or antiplatelet                            agents. ASA Grade Assessment: II - A patient with                            mild systemic disease. After reviewing the risks                            and benefits, the patient was deemed in  satisfactory condition to undergo the procedure.                           After obtaining informed consent, the colonoscope                            was passed under direct vision. Throughout the                            procedure, the patient's blood pressure, pulse, and                            oxygen saturations were monitored continuously. The                            Colonoscope was introduced through the anus and               advanced to the cecum, identified by appendiceal                            orifice and ileocecal valve. The colonoscopy was                            performed without difficulty. The patient tolerated                            the procedure well. The quality of the bowel                            preparation was good. The ileocecal valve,                            appendiceal orifice, and rectum were photographed. Scope In: 2:29:48 PM Scope Out: [id number] PM Scope Withdrawal Time: 0 hours 13 minutes 12 seconds  Total Procedure Duration: 0 hours 16 minutes 28 seconds  Findings:                 The digital rectal exam was normal.                           Two sessile polyps were found in the ascending                            colon. The polyps were 5 to 8 mm in size. These                            polyps were removed with a cold snare. Resection                            and retrieval were complete.                           A 6 mm polyp was found in the descending colon. The  polyp was sessile. The polyp was removed with a                            cold snare. Resection and retrieval were complete.                           A 4 mm polyp was found in the sigmoid colon. The                            polyp was sessile. The polyp was removed with a                            cold snare. Resection and retrieval were complete.                           Multiple small and large-mouthed diverticula were                            found in the sigmoid colon and descending colon.                           Internal hemorrhoids were found during retroflexion. Complications:            No immediate complications. Estimated Blood Loss:     Estimated blood loss was minimal. Impression:               - Two 5 to 8 mm polyps in the ascending colon,                            removed with a cold snare. Resected and retrieved.                           - One 6  mm polyp in the descending colon, removed                            with a cold snare. Resected and retrieved.                           - One 4 mm polyp in the sigmoid colon, removed with                            a cold snare. Resected and retrieved.                           - Diverticulosis in the sigmoid colon and in the                            descending colon.                           - Internal hemorrhoids. Recommendation:           - Patient has a contact number available for  emergencies. The signs and symptoms of potential                            delayed complications were discussed with the                            patient. Return to normal activities tomorrow.                            Written discharge instructions were provided to the                            patient.                           - Resume previous diet.                           - Continue present medications.                           - Await pathology results.                           - Repeat colonoscopy is recommended for                            surveillance. The colonoscopy date will be                            determined after pathology results from today's                            exam become available for review. Jerene Bears, MD 11/22/2019 2:57:32 PM This report has been signed electronically.

## 2019-11-22 NOTE — Op Note (Signed)
[facility] Patient Name: Nathaniel Gill Procedure Date: 11/22/2019 1:58 PM MRN: [id number] Endoscopist: Jerene Bears , MD Age: 69 Referring MD:  Date of Birth: 31-Aug-1950 Gender: Male Account #: [id number] Procedure:                Upper GI endoscopy Indications:              Dysphagia, Gastro-esophageal reflux disease,                            Abnormal cine-esophagram Medicines:                Monitored Anesthesia Care Procedure:                Pre-Anesthesia Assessment:                           - Prior to the procedure, a History and Physical                            was performed, and patient medications and                            allergies were reviewed. The patient's tolerance of                            previous anesthesia was also reviewed. The risks                            and benefits of the procedure and the sedation                            options and risks were discussed with the patient.                            All questions were answered, and informed consent                            was obtained. Prior Anticoagulants: The patient has                            taken no previous anticoagulant or antiplatelet                            agents. ASA Grade Assessment: II - A patient with                            mild systemic disease. After reviewing the risks                            and benefits, the patient was deemed in                            satisfactory condition to undergo the procedure.  After obtaining informed consent, the endoscope was                            passed under direct vision. Throughout the                            procedure, the patient's blood pressure, pulse, and                            oxygen saturations were monitored continuously. The                            Endoscope was introduced through the mouth, and                            advanced to the second part of duodenum.  The upper                            GI endoscopy was accomplished without difficulty.                            The patient tolerated the procedure well. Scope In: Scope Out: Findings:                 A low-grade of narrowing Schatzki ring was found at                            the gastroesophageal junction. A TTS dilator was                            passed through the scope. Dilation with a 16-17-18                            mm balloon dilator was performed to 18 mm. The                            dilation site was examined and showed mild mucosal                            disruption.                           A 3 cm hiatal hernia was present.                           The gastroesophageal flap valve was visualized                            endoscopically and classified as Hill Grade IV (no                            fold, wide open lumen, hiatal hernia present).  The entire examined stomach was normal.                           A single 10 mm sessile polyp was found in the                            second portion of the duodenum. This polyp has the                            appearance of a tubular adenoma. This was not                            biopsied given the appearance and so as not to make                            future EMR more difficult.                           Lymphangiectasias were present in the second                            portion of the duodenum. Complications:            No immediate complications. Estimated Blood Loss:     Estimated blood loss was minimal. Impression:               - Low-grade of narrowing Schatzki ring. Dilated to                            18 mm with balloon.                           - 3 cm hiatal hernia.                           - Normal stomach.                           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                           -  Duodenal mucosal lymphangiectasias.                           - No specimens collected. Recommendation:           - Patient has a contact number available for                            emergencies. The signs and symptoms of potential                            delayed complications were discussed with the  patient. Return to normal activities tomorrow.                            Written discharge instructions were provided to the                            patient.                           - Resume previous diet.                           - Continue present medications.                           - Continue omeprazole 40 mg once daily.                           - Repeat upper endoscopy in the outpatient hospital                            setting at appointment to be scheduled for                            endoscopic mucosal resection of the duodenal polyp. Jerene Bears, MD 11/22/2019 2:55:04 PM This report has been signed electronically.

## 2019-11-22 NOTE — Progress Notes (Signed)
Report to PACU, RN, vss, BBS= Clear.  

## 2019-11-26 ENCOUNTER — Telehealth: Payer: Self-pay

## 2019-11-26 ENCOUNTER — Telehealth: Payer: Self-pay | Admitting: *Deleted

## 2019-11-26 NOTE — Telephone Encounter (Signed)
I called the patient back and he is feeling well. He has had a subsequent bowel movement after the one noted by Judson Roch Monday 4 hours ago.  His subsequent bowel movement did not contain blood.  He feels that the bleeding has subsided.  He questioned whether this could even have been hemorrhoidal bleeding which she has intermittently seen in the past. Given that he is feeling well, reassurance provided.  He was advised to let me know if he has further rectal bleeding or certainly any bowel movement containing significant or large amount of blood.  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 Rush Landmark and the patient today.  This lesion will need EMR and Dr. Rush Landmark feels that he can remove this polyp during one of his outpatient hospital sessions in late July or August. Patty, please schedule patient for direct outpatient hospital procedure for EMR of duodenal polyp.  He does not need to be seen in the office before this procedure which I have discussed with Dr. Rush Landmark who is in agreement.  Thanks  [employer]

## 2019-11-26 NOTE — Telephone Encounter (Signed)
Pt called back after having his first bowel movement and he noticed some bright red blood in the toilet and when he wiped on the toilet tissue. I told him I would let Dr. Hilarie Fredrickson know and that he should keep an eye on it in the meantime. He hasn't had any more bloody stool since 10 am.

## 2019-11-26 NOTE — Telephone Encounter (Signed)
  Follow up Call-  Call back number 11/22/2019  Post procedure Call Back phone  # [phone]  Permission to leave phone message Yes  Some recent data might be hidden     Patient questions:  Do you have a fever, pain , or abdominal swelling? No. Pain Score  0 *  Have you tolerated food without any problems? Yes.    Have you been able to return to your normal activities? Yes.    Do you have any questions about your discharge instructions: Diet   No. Medications  No. Follow up visit  No.  Do you have questions or concerns about your Care? No.  Actions: * If pain score is 4 or above: No action needed, pain <4.  1. Have you developed a fever since your procedure? no  2.   Have you had an respiratory symptoms (SOB or cough) since your procedure? no  3.   Have you tested positive for COVID 19 since your procedure no  4.   Have you had any family members/close contacts diagnosed with the COVID 19 since your procedure?  no   If yes to any of these questions please route to Joylene John, RN and Erenest Rasher, RN

## 2019-11-27 ENCOUNTER — Encounter: Payer: Self-pay | Admitting: Internal Medicine

## 2019-11-27 NOTE — Telephone Encounter (Signed)
Thanks JMP for referral. Patty, [place] to proceed with 75 minute EMR case in July/August based on availability. No clinic visit for this patient at this time, Dr. Hilarie Fredrickson has discussed case with patient. Thanks. GM

## 2019-11-28 ENCOUNTER — Other Ambulatory Visit: Payer: Self-pay

## 2019-11-28 DIAGNOSIS — D132 Benign neoplasm of duodenum: Secondary | ICD-10-CM

## 2019-11-28 NOTE — Telephone Encounter (Signed)
EUS scheduled, pt instructed and medications reviewed.  Patient instructions mailed to home.  Patient to call with any questions or concerns.  I confirmed that the pt can review the information via My Chart

## 2019-11-28 NOTE — Telephone Encounter (Signed)
EGD EMR scheduled for 01/27/20 at 10 am at [facility] with Dr Rush Landmark COVID test on 7/22 at 1040 am

## 2019-12-06 NOTE — Progress Notes (Signed)
n

## 2019-12-09 ENCOUNTER — Encounter: Payer: Self-pay | Admitting: Adult Health

## 2019-12-09 ENCOUNTER — Other Ambulatory Visit: Payer: Self-pay | Admitting: Adult Health

## 2019-12-09 MED ORDER — METFORMIN HCL ER 500 MG PO TB24: ORAL_TABLET | ORAL | 1 refills | Status: DC

## 2019-12-11 ENCOUNTER — Ambulatory Visit: Payer: 59 | Admitting: Adult Health

## 2019-12-26 DIAGNOSIS — Z860101 Personal history of adenomatous and serrated colon polyps: Secondary | ICD-10-CM | POA: Insufficient documentation

## 2019-12-26 DIAGNOSIS — Z86010 Personal history of colonic polyps: Secondary | ICD-10-CM | POA: Insufficient documentation

## 2019-12-26 NOTE — Progress Notes (Signed)
FOLLOW UP  Assessment and Plan:   Atherosclerosis of aorta Per CXR 07/2018 Control blood pressure, cholesterol, glucose, increase exercise.   Hypertension Off of several agents today with reasonable control; continue coreg, restart zestoretic D/c amlodipine, minoxidil; patient preference for fewer agents if possible; maximize above therapy Monitor blood pressure at home; patient to call if consistently greater than 130/80 Continue DASH diet.   Reminder to go to the ER if any CP, SOB, nausea, dizziness, severe HA, changes vision/speech, left arm numbness and tingling and jaw pain.  Hyperlipidemia associated with T2DM ([place]) At goal with rosuvastatin 40 mg daily  LDL goal <70 Continue low cholesterol diet and exercise.  Check lipid panel.   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  Check A1C  CKD II associated with T2DM ([facility]) Increase fluids, avoid NSAIDS, monitor sugars, will monitor CMP/GFR  Obesity - BMI 30 Long discussion about weight loss, diet, and exercise Recommended diet heavy in fruits and veggies and low in animal meats, cheeses, and dairy products, appropriate calorie intake Discussed ideal weight for height; weight loss goal <220lb Continue exercise, portions;  Will follow up in 3 months  Vitamin D Def Below goal at last visit; has increased dose continue supplementation to maintain goal of 60-100 Check Vit D level at CPE  GERD/esophageal stricture/hiatal hernia Continue PPI; s/p dilation; follow up as recommended by Dr. Hilarie Fredrickson and PRN Discussed small hiatal hernia will monitor unless severe sx not relieved   Continue diet and  meds as discussed. Further disposition pending results of labs. Discussed med's effects and SE's.   Over 30 minutes of exam, counseling, chart review, and critical decision making was performed.   Future Appointments  Date Time Provider [place]  01/23/2020 10:40 AM [facility] None  03/16/2020  8:45 AM Liane Comber, NP [facility] None  06/16/2020  9:00 AM Liane Comber, NP [facility] None    ----------------------------------------------------------------------------------------------------------------------  HPI 69 y.o. male  presents for 3 month follow up on hypertension, cholesterol, T2DM with CKD, ED, weight and vitamin D deficiency.   he has a diagnosis of GERD, was reporting dysphagia and odynophagia and recently underwent esophagram which demonstrated distal esophageal stricture and small hiatal hernia. Omeprazole was initiated. Denies breakthrough symptoms. He had colonoscopy and EGD by Dr. Hilarie Fredrickson on 11/22/2019 which showed 3 cm hital hernia, low-grade of narrowing schatzki ring (dilated), duodenal polyp suspect for adenoma (scheduled for removal in July 2021). Colonoscopy showed several adenomatous polyps. Planning follow up EGD for removal, follow up colonoscopy recommended in 3 years. Continue omeprazole 40 mg daily, and reports has elevated HOB 2 inches with fully resolved GERD.   he is prescribed phentermine for weight loss, but has been off for several weeks.  While on the medication they have lost 0 lbs since last visit. They deny palpitations, anxiety, trouble sleeping, elevated BP.   BMI is Body mass index is 30.48 kg/m., he is working on diet and exercise. Eating healthy choice meals and smaller portions, less snacking, out of Y since covid 19, has been working out daily at home with new gym system.  He drinks mainly water, refills 12 ounce cup x 6 daily =72  ounces.  Tracking weight, home scale body fat reading is 18%.  He is doing his best with exercise  at home, weights; waist size down to 36.  Was on phentermine for some time but without perceived benefit  Reports on home scale has been down to 222 lb.  Wt Readings from Last 3 Encounters:  12/27/19 231 lb (104.8 kg)  11/22/19 231 lb (104.8 kg)  10/16/19 231 lb 4 oz (104.9 kg)   He is followed by Dr. 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  He had essentially normal ECHO in 07/2017 obtained for murmur evaluation; attributed to mild septal thickening.  He has aortic atherosclerosis per CXR 07/2018.  His blood pressure has not been controlled at home, (has been taking carvediolol 12.5 mg BID, but has been out of lisinopril/hctz and amlodipine for 3 weeks, has been off of minoxidil for some time,  trying to see if still needs all agents) today their BP is BP: 128/70  He does workout. He denies chest pain, shortness of breath, dizziness.   He is on cholesterol medication (rosuvastatin 40 mg daily) and denies myalgias. His cholesterol is essentially at goal. The cholesterol last visit was:   Lab Results  Component Value Date   CHOL 127 09/09/2019   HDL 44 09/09/2019   LDLCALC 68 09/09/2019   TRIG 72 09/09/2019   CHOLHDL 2.9 09/09/2019    He has been working on diet and exercise for T2 diabetes (A1C 6.5% in 10/2018, 6.6% in 02/2019) currently treated by lifestyle, and denies foot ulcerations, increased appetite, nausea, paresthesia of the feet, polydipsia, polyuria, visual disturbances, vomiting and weight loss. He has ED treated by sildenafil PRN. Last A1C in the office was:  Lab Results  Component Value Date   HGBA1C 6.4 (H) 09/09/2019    He has CKD II associated with T2DM monitored q18m at this office:  Lab Results  Component Value Date   GFRNONAA 85 09/09/2019   Patient is on Vitamin D supplement, taking 4000 IU daily, didn't increase previously due to calcium, now resolved.  Lab Results  Component Value Date   VD25OH 35 05/20/2019      Current Medications:   Current Outpatient Medications on File Prior to Visit  Medication Sig   amLODipine (NORVASC) 10 MG tablet Take 1 tablet Daily for BP   aspirin 81 MG tablet Take 81 mg by mouth daily.   carvedilol (COREG) 12.5 MG tablet TAKE ONE TABLET BY MOUTH TWICE A DAY   Cholecalciferol 50 MCG (2000 UT) CAPS Take 8,000 Units by mouth daily.   metFORMIN (GLUCOPHAGE-XR) 500 MG 24 hr tablet Start taking 1 tab daily with largest meal of the day for blood sugar and weight loss; then if tolerating increase to twice daily in 2 weeks (Patient taking differently: Take one tablet twice daily)   olmesartan-hydrochlorothiazide (BENICAR HCT) 40-25 MG tablet Take 1 tablet by mouth daily.   omeprazole (PRILOSEC) 40 MG capsule Take 1 capsule (40 mg total) by mouth every evening. To reduce stomach acid.   OVER THE COUNTER MEDICATION Balance of Nature- Fruits and Veggies, Take 3 tablets once daily   rosuvastatin (CRESTOR) 40 MG tablet Take 1 tablet Daily for Cholesterol   sildenafil (VIAGRA) 100 MG tablet Take 1/4-1/2 tab as needed.   Zinc 50 MG TABS Take 1 tablet by mouth daily.    Black Pepper-Turmeric (TURMERIC COMPLEX/BLACK PEPPER PO) Take 500 mg by mouth daily.  (Patient not taking: Reported on 12/27/2019)   No current  facility-administered medications on file prior to visit.     Allergies: No Known Allergies   Medical History:  Past Medical History:  Diagnosis Date   Arthritis    DDD (degenerative disc disease), lumbar    ED (erectile dysfunction)    GERD (gastroesophageal reflux disease)    Hiatal hernia    History of colon polyps    Hyperlipidemia    Hypertension    No sign of renal artery stenosis   IBS (irritable bowel syndrome)    RBBB    Systolic murmur    Vertigo    Vitamin D deficiency    Family history- Reviewed and unchanged Social history- Reviewed and unchanged   Review of Systems:  Review of Systems  Constitutional: Negative for malaise/fatigue and weight  loss.  HENT: Negative for hearing loss and tinnitus.   Eyes: Negative for blurred vision and double vision.  Respiratory: Negative for cough, shortness of breath and wheezing.   Cardiovascular: Negative for chest pain, palpitations, orthopnea, claudication and leg swelling.  Gastrointestinal: Negative for abdominal pain, blood in stool, constipation, diarrhea, heartburn, melena, nausea and vomiting.  Genitourinary: Negative.   Musculoskeletal: Negative for joint pain and myalgias.  Skin: Negative for rash.  Neurological: Negative for dizziness, tingling, sensory change, weakness and headaches.  Endo/Heme/Allergies: Negative for polydipsia.  Psychiatric/Behavioral: Negative.   All other systems reviewed and are negative.   Physical Exam: BP 128/70    Pulse 73    Temp 97.9 F (36.6 C)    Wt 231 lb (104.8 kg)    SpO2 97%    BMI 30.48 kg/m  Wt Readings from Last 3 Encounters:  12/27/19 231 lb (104.8 kg)  11/22/19 231 lb (104.8 kg)  10/16/19 231 lb 4 oz (104.9 kg)   General Appearance: Well nourished, in no apparent distress. Eyes: PERRLA, EOMs, conjunctiva no swelling or erythema Sinuses: No Frontal/maxillary tenderness ENT/Mouth: Ext aud canals clear, TMs without erythema, bulging. No erythema, swelling, or exudate on post pharynx. Tonsils not swollen or erythematous. Hearing normal.  Neck: Supple, thyroid normal.  Respiratory: Respiratory effort normal, BS equal bilaterally without rales, rhonchi, wheezing or stridor.  Cardio: RRR with no MRGs. Brisk peripheral pulses without edema.  Abdomen: Soft, + BS.  Non tender, no guarding, rebound, hernias, masses. Lymphatics: Non tender without lymphadenopathy.  Musculoskeletal: Full ROM, 5/5 strength, Normal gait Skin: Warm, dry without rashes, lesions, ecchymosis.  Neuro: Cranial nerves intact. No cerebellar symptoms.  Psych: Awake and oriented X 3, normal affect, Insight and Judgment appropriate.    Izora Ribas, NP 10:25  AM [facility]

## 2019-12-27 ENCOUNTER — Encounter: Payer: Self-pay | Admitting: Adult Health

## 2019-12-27 ENCOUNTER — Ambulatory Visit: Payer: 59 | Admitting: Adult Health

## 2019-12-27 ENCOUNTER — Other Ambulatory Visit: Payer: Self-pay

## 2019-12-27 VITALS — BP 128/70 | HR 73 | Temp 97.9°F | Wt 231.0 lb

## 2019-12-27 DIAGNOSIS — E1122 Type 2 diabetes mellitus with diabetic chronic kidney disease: Secondary | ICD-10-CM

## 2019-12-27 DIAGNOSIS — K219 Gastro-esophageal reflux disease without esophagitis: Secondary | ICD-10-CM

## 2019-12-27 DIAGNOSIS — I700 Atherosclerosis of aorta: Secondary | ICD-10-CM

## 2019-12-27 DIAGNOSIS — E559 Vitamin D deficiency, unspecified: Secondary | ICD-10-CM

## 2019-12-27 DIAGNOSIS — E1169 Type 2 diabetes mellitus with other specified complication: Secondary | ICD-10-CM

## 2019-12-27 DIAGNOSIS — I10 Essential (primary) hypertension: Secondary | ICD-10-CM | POA: Diagnosis not present

## 2019-12-27 DIAGNOSIS — N182 Chronic kidney disease, stage 2 (mild): Secondary | ICD-10-CM

## 2019-12-27 DIAGNOSIS — E785 Hyperlipidemia, unspecified: Secondary | ICD-10-CM

## 2019-12-27 DIAGNOSIS — K222 Esophageal obstruction: Secondary | ICD-10-CM

## 2019-12-27 DIAGNOSIS — E669 Obesity, unspecified: Secondary | ICD-10-CM

## 2019-12-27 DIAGNOSIS — Z79899 Other long term (current) drug therapy: Secondary | ICD-10-CM

## 2019-12-27 DIAGNOSIS — Z86010 Personal history of colonic polyps: Secondary | ICD-10-CM

## 2019-12-27 NOTE — Patient Instructions (Signed)
Goals     Blood Pressure < 130/80     DIET - INCREASE WATER INTAKE     Recommend 65-80 fluid ounces daily      Exercise 150 min/wk Moderate Activity     LDL CALC < 100     Weight (lb) < 220 lb (99.8 kg)       May consider HIIT (high intensity interval training) if doing a lot of steady state cardio -    A great goal to work towards is aiming to get in a serving daily of some of the most nutritionally dense foods - G- BOMBS daily

## 2019-12-28 LAB — COMPLETE METABOLIC PANEL WITH GFR
AG Ratio: 1.8 (calc) (ref 1.0–2.5)
ALT: 27 U/L (ref 9–46)
AST: 24 U/L (ref 10–35)
Albumin: 4.6 g/dL (ref 3.6–5.1)
Alkaline phosphatase (APISO): 39 U/L (ref 35–144)
BUN: 20 mg/dL (ref 7–25)
CO2: 30 mmol/L (ref 20–32)
Calcium: 10.5 mg/dL — ABNORMAL HIGH (ref 8.6–10.3)
Chloride: 101 mmol/L (ref 98–110)
Creat: 0.96 mg/dL (ref 0.70–1.25)
GFR, Est African American: 94 mL/min/{1.73_m2} (ref >=60)
GFR, Est Non African American: 81 mL/min/{1.73_m2} (ref >=60)
Globulin: 2.5 g/dL (calc) (ref 1.9–3.7)
Glucose, Bld: 107 mg/dL — ABNORMAL HIGH (ref 65–99)
Potassium: 4 mmol/L (ref 3.5–5.3)
Sodium: 138 mmol/L (ref 135–146)
Total Bilirubin: 0.5 mg/dL (ref 0.2–1.2)
Total Protein: 7.1 g/dL (ref 6.1–8.1)

## 2019-12-28 LAB — CBC WITH DIFFERENTIAL/PLATELET
Absolute Monocytes: 636 cells/uL (ref 200–950)
Basophils Absolute: 90 cells/uL (ref 0–200)
Basophils Relative: 1.7 %
Eosinophils Absolute: 376 cells/uL (ref 15–500)
Eosinophils Relative: 7.1 %
HCT: 42.4 % (ref 38.5–50.0)
Hemoglobin: 13.9 g/dL (ref 13.2–17.1)
Lymphs Abs: 1341 cells/uL (ref 850–3900)
MCH: 29 pg (ref 27.0–33.0)
MCHC: 32.8 g/dL (ref 32.0–36.0)
MCV: 88.3 fL (ref 80.0–100.0)
MPV: 9.6 fL (ref 7.5–12.5)
Monocytes Relative: 12 %
Neutro Abs: 2857 cells/uL (ref 1500–7800)
Neutrophils Relative %: 53.9 %
Platelets: 270 10*3/uL (ref 140–400)
RBC: 4.8 10*6/uL (ref 4.20–5.80)
RDW: 13.5 % (ref 11.0–15.0)
Total Lymphocyte: 25.3 %
WBC: 5.3 10*3/uL (ref 3.8–10.8)

## 2019-12-28 LAB — HEMOGLOBIN A1C
Hgb A1c MFr Bld: 6.5 % of total Hgb — ABNORMAL HIGH (ref <5.7)
Mean Plasma Glucose: 140 (calc)
eAG (mmol/L): 7.7 (calc)

## 2019-12-28 LAB — LIPID PANEL
Cholesterol: 135 mg/dL (ref <200)
HDL: 41 mg/dL (ref >=40)
LDL Cholesterol (Calc): 76 mg/dL (calc)
Non-HDL Cholesterol (Calc): 94 mg/dL (calc) (ref <130)
Total CHOL/HDL Ratio: 3.3 (calc) (ref <5.0)
Triglycerides: 95 mg/dL (ref <150)

## 2019-12-28 LAB — MAGNESIUM: Magnesium: 1.9 mg/dL (ref 1.5–2.5)

## 2019-12-28 LAB — TSH: TSH: 3.44 mIU/L (ref 0.40–4.50)

## 2019-12-31 ENCOUNTER — Other Ambulatory Visit: Payer: Self-pay | Admitting: Cardiology

## 2020-01-04 ENCOUNTER — Other Ambulatory Visit: Payer: Self-pay | Admitting: Internal Medicine

## 2020-01-04 DIAGNOSIS — I10 Essential (primary) hypertension: Secondary | ICD-10-CM

## 2020-01-23 ENCOUNTER — Other Ambulatory Visit (HOSPITAL_COMMUNITY)
Admission: RE | Admit: 2020-01-23 | Discharge: 2020-01-23 | Disposition: A | Discharge status: Home / Self Care | Payer: 59 | Source: Ambulatory Visit | Attending: Gastroenterology | Admitting: Gastroenterology

## 2020-01-23 DIAGNOSIS — Z20822 Contact with and (suspected) exposure to covid-19: Secondary | ICD-10-CM | POA: Diagnosis not present

## 2020-01-23 DIAGNOSIS — Z01812 Encounter for preprocedural laboratory examination: Secondary | ICD-10-CM | POA: Diagnosis present

## 2020-01-23 LAB — SARS CORONAVIRUS 2 (TAT 6-24 HRS): SARS Coronavirus 2: NEGATIVE

## 2020-01-24 ENCOUNTER — Encounter (HOSPITAL_COMMUNITY): Payer: Self-pay | Admitting: Gastroenterology

## 2020-01-24 NOTE — Progress Notes (Signed)
PCP - Liane Comber, NP Cardiologist - Dr [name]  Chest x-ray - n/a EKG - 05/30/19 Stress Test - n/a ECHO - 07/07/17 Cardiac Cath - 2005  Anesthesia review: Yes  STOP now taking any Aspirin (unless otherwise instructed by your surgeon), Aleve, Naproxen, Ibuprofen, Motrin, Advil, Goody's, BC's, all herbal medications, fish oil, and all vitamins.   No metformin on DOS.  Patient takes this medication for weight loss.  Coronavirus Screening Covid test on 01/23/20 was negative. Patient denies shortness of breath, fever, cough or chest pain.  Patient verbalized understanding of instructions that were given via phone.

## 2020-01-24 NOTE — Progress Notes (Signed)
Anesthesia Chart Review: SAME DAY WORK-UP (ENDO)    Case: [id number] Date/Time: 01/27/20 0915   Procedures:      ESOPHAGOGASTRODUODENOSCOPY (EGD) WITH PROPOFOL (N/A )     ENDOSCOPIC MUCOSAL RESECTION (N/A )   Anesthesia type: Monitor Anesthesia Care   Pre-op diagnosis: duodenal polyp   Location: [place] [facility] [place] [facility]   Surgeons: Mansouraty, Telford Nab., MD      DISCUSSION: Patient is a 69 year old male scheduled for the above procedure.  History includes never smoker, HTN, HLD, vertigo, hiatal hernia, GERD, IBS, RBBB, murmur (aortic sclerosis without AS, trivial MR/TR 2019 echo).   01/23/2020 preprocedure COVID-19 test negative.  Anesthesia team to evaluate on the day of procedure.   VS: Ht 6\' 1"  (1.854 m)   Wt (!) 104.3 kg   BMI 30.34 kg/m   BP Readings from Last 3 Encounters:  12/27/19 128/70  11/22/19 118/67  10/16/19 114/60   Pulse Readings from Last 3 Encounters:  12/27/19 73  11/22/19 61  10/16/19 74    PROVIDERS: Unk Pinto, MD is PCP  [name], MD is cardiologist. Last tele visit 03/24/19 for annual follow-up of resistant hypertension, HLD, chronic RBBB, and aortic atherosclerosis.  No changes made with 98-month follow-up recommended.   LABS: Per GI. As of 12/27/19, results include: Lab Results  Component Value Date   WBC 5.3 12/27/2019   HGB 13.9 12/27/2019   HCT 42.4 12/27/2019   PLT 270 12/27/2019   GLUCOSE 107 (H) 12/27/2019   CHOL 135 12/27/2019   TRIG 95 12/27/2019   HDL 41 12/27/2019   LDLCALC 76 12/27/2019   ALT 27 12/27/2019   AST 24 12/27/2019   NA 138 12/27/2019   K 4.0 12/27/2019   CL 101 12/27/2019   CREATININE 0.96 12/27/2019   BUN 20 12/27/2019   CO2 30 12/27/2019   TSH 3.44 12/27/2019   HGBA1C 6.5 (H) 12/27/2019    EKG: EKG 05/20/2019: Sinus rhythm.  Right bundle branch block.   CV: Echo 07/07/17: Study Conclusions  - Left ventricle: The cavity size was normal. Systolic function was  normal. The estimated  ejection fraction was in the range of 55%  to 60%. Wall motion was normal; there were no regional wall  motion abnormalities. Doppler parameters are consistent with  abnormal left ventricular relaxation (grade 1 diastolic  dysfunction).  - Aortic sclerosis without stenosis.  Trivial mitral regurgitation.  Trivial tricuspid regurgitation.  Past Medical History:  Diagnosis Date  . Arthritis   . DDD (degenerative disc disease), lumbar   . ED (erectile dysfunction)   . GERD (gastroesophageal reflux disease)   . Hiatal hernia   . History of colon polyps   . Hyperlipidemia   . Hypertension    No sign of renal artery stenosis  . IBS (irritable bowel syndrome)   . RBBB   . Systolic murmur    no  current problems  . Vertigo   . Vitamin D deficiency     Past Surgical History:  Procedure Laterality Date  . ABDOMINAL SURGERY  1956   Related to bleeding episode.   Marland Kitchen CARDIAC CATHETERIZATION  2005   no intervention per patient  . COLONOSCOPY     several - Last one 11/22/19  . CYST REMOVAL HAND Left 1975   [place]  . SKIN LESION EXCISION    . STAPEDECTOMY Bilateral    x 2 - 10 yrs apart - No MRIs per patient.  . TRANSTHORACIC ECHOCARDIOGRAM  07/2017   Normal  LV size and function.  EF 55-60%.  No RWMA.  Basal septal hypertrophy, but no sign of significant hypertensive heart disease.  Only GR 1 DD.  Marland Kitchen TRIGGER FINGER RELEASE Right 2014   [place]  . UPPER GI ENDOSCOPY     several - last one 11/22/2019  . WISDOM TOOTH EXTRACTION      MEDICATIONS: No current facility-administered medications for this encounter.   Marland Kitchen amLODipine (NORVASC) 10 MG tablet  . Ascorbic Acid (VITAMIN C) 1000 MG tablet  . aspirin 81 MG tablet  . carvedilol (COREG) 12.5 MG tablet  . Cholecalciferol 50 MCG (2000 UT) CAPS  . metFORMIN (GLUCOPHAGE-XR) 500 MG 24 hr tablet  . minoxidil (LONITEN) 10 MG tablet  . olmesartan-hydrochlorothiazide (BENICAR HCT) 40-25 MG tablet  . omeprazole (PRILOSEC) 40 MG capsule   . OVER THE COUNTER MEDICATION  . rosuvastatin (CRESTOR) 40 MG tablet  . sildenafil (VIAGRA) 100 MG tablet  . vitamin E 200 UNIT capsule  . Zinc 50 MG TABS    Myra Gianotti, PA-C Surgical Short Stay/Anesthesiology [facility] Phone [phone] [facility] Phone [phone] 01/24/2020 5:09 PM

## 2020-01-24 NOTE — Anesthesia Preprocedure Evaluation (Addendum)
Anesthesia Evaluation  Patient identified by MRN, date of birth, ID band Patient awake    Reviewed: Allergy & Precautions, H&P , NPO status , Patient's Chart, lab work & pertinent test results, reviewed documented beta blocker date and time   Airway Mallampati: II  TM Distance: >3 FB Neck ROM: Full    Dental no notable dental hx. (+) Teeth Intact, Dental Advisory Given   Pulmonary neg pulmonary ROS,    Pulmonary exam normal breath sounds clear to auscultation       Cardiovascular hypertension, Pt. on medications and Pt. on home beta blockers + dysrhythmias  Rhythm:Regular Rate:Normal     Neuro/Psych negative neurological ROS  negative psych ROS   GI/Hepatic Neg liver ROS, hiatal hernia, GERD  Medicated and Controlled,  Endo/Other  diabetes, Type 2, Oral Hypoglycemic Agents  Renal/GU Renal disease  negative genitourinary   Musculoskeletal  (+) Arthritis , Osteoarthritis,    Abdominal   Peds  Hematology negative hematology ROS (+)   Anesthesia Other Findings   Reproductive/Obstetrics negative OB ROS                           Anesthesia Physical Anesthesia Plan  ASA: II  Anesthesia Plan: MAC   Post-op Pain Management:    Induction: Intravenous  PONV Risk Score and Plan: 1 and Propofol infusion  Airway Management Planned: Nasal Cannula  Additional Equipment:   Intra-op Plan:   Post-operative Plan:   Informed Consent: I have reviewed the patients History and Physical, chart, labs and discussed the procedure including the risks, benefits and alternatives for the proposed anesthesia with the patient or authorized representative who has indicated his/her understanding and acceptance.     Dental advisory given  Plan Discussed with: CRNA  Anesthesia Plan Comments: (PAT note written 01/24/2020 by Myra Gianotti, PA-C. )      Anesthesia Quick Evaluation

## 2020-01-27 ENCOUNTER — Encounter (HOSPITAL_COMMUNITY)
Admission: RE | Disposition: A | Discharge status: Home / Self Care | Payer: Self-pay | Source: Home / Self Care | Attending: Gastroenterology

## 2020-01-27 ENCOUNTER — Other Ambulatory Visit: Payer: Self-pay

## 2020-01-27 ENCOUNTER — Other Ambulatory Visit: Payer: Self-pay | Admitting: Physician Assistant

## 2020-01-27 ENCOUNTER — Ambulatory Visit (HOSPITAL_COMMUNITY): Payer: 59 | Admitting: Vascular Surgery

## 2020-01-27 ENCOUNTER — Ambulatory Visit (HOSPITAL_COMMUNITY)
Admission: RE | Admit: 2020-01-27 | Discharge: 2020-01-27 | Disposition: A | Discharge status: Home / Self Care | Payer: 59 | Attending: Gastroenterology | Admitting: Gastroenterology

## 2020-01-27 ENCOUNTER — Encounter (HOSPITAL_COMMUNITY): Payer: Self-pay | Admitting: Gastroenterology

## 2020-01-27 DIAGNOSIS — K317 Polyp of stomach and duodenum: Secondary | ICD-10-CM

## 2020-01-27 DIAGNOSIS — R42 Dizziness and giddiness: Secondary | ICD-10-CM | POA: Diagnosis not present

## 2020-01-27 DIAGNOSIS — K222 Esophageal obstruction: Secondary | ICD-10-CM | POA: Diagnosis not present

## 2020-01-27 DIAGNOSIS — M1990 Unspecified osteoarthritis, unspecified site: Secondary | ICD-10-CM | POA: Diagnosis not present

## 2020-01-27 DIAGNOSIS — K449 Diaphragmatic hernia without obstruction or gangrene: Secondary | ICD-10-CM | POA: Diagnosis not present

## 2020-01-27 DIAGNOSIS — Z7984 Long term (current) use of oral hypoglycemic drugs: Secondary | ICD-10-CM | POA: Diagnosis not present

## 2020-01-27 DIAGNOSIS — Z825 Family history of asthma and other chronic lower respiratory diseases: Secondary | ICD-10-CM | POA: Diagnosis not present

## 2020-01-27 DIAGNOSIS — K589 Irritable bowel syndrome without diarrhea: Secondary | ICD-10-CM | POA: Insufficient documentation

## 2020-01-27 DIAGNOSIS — K219 Gastro-esophageal reflux disease without esophagitis: Secondary | ICD-10-CM | POA: Insufficient documentation

## 2020-01-27 DIAGNOSIS — I4510 Unspecified right bundle-branch block: Secondary | ICD-10-CM | POA: Insufficient documentation

## 2020-01-27 DIAGNOSIS — E559 Vitamin D deficiency, unspecified: Secondary | ICD-10-CM | POA: Insufficient documentation

## 2020-01-27 DIAGNOSIS — Z8249 Family history of ischemic heart disease and other diseases of the circulatory system: Secondary | ICD-10-CM | POA: Insufficient documentation

## 2020-01-27 DIAGNOSIS — Z7982 Long term (current) use of aspirin: Secondary | ICD-10-CM | POA: Insufficient documentation

## 2020-01-27 DIAGNOSIS — Z79899 Other long term (current) drug therapy: Secondary | ICD-10-CM | POA: Insufficient documentation

## 2020-01-27 DIAGNOSIS — Z86010 Personal history of colonic polyps: Secondary | ICD-10-CM | POA: Insufficient documentation

## 2020-01-27 DIAGNOSIS — D132 Benign neoplasm of duodenum: Secondary | ICD-10-CM

## 2020-01-27 DIAGNOSIS — Z8349 Family history of other endocrine, nutritional and metabolic diseases: Secondary | ICD-10-CM | POA: Insufficient documentation

## 2020-01-27 DIAGNOSIS — Z841 Family history of disorders of kidney and ureter: Secondary | ICD-10-CM | POA: Insufficient documentation

## 2020-01-27 DIAGNOSIS — E785 Hyperlipidemia, unspecified: Secondary | ICD-10-CM | POA: Diagnosis not present

## 2020-01-27 DIAGNOSIS — M5136 Other intervertebral disc degeneration, lumbar region: Secondary | ICD-10-CM | POA: Diagnosis not present

## 2020-01-27 DIAGNOSIS — K3189 Other diseases of stomach and duodenum: Secondary | ICD-10-CM | POA: Diagnosis not present

## 2020-01-27 DIAGNOSIS — I10 Essential (primary) hypertension: Secondary | ICD-10-CM | POA: Insufficient documentation

## 2020-01-27 HISTORY — PX: SUBMUCOSAL LIFTING INJECTION: SHX6855

## 2020-01-27 HISTORY — PX: ENTEROSCOPY: SHX5533

## 2020-01-27 HISTORY — PX: HEMOSTASIS CLIP PLACEMENT: SHX6857

## 2020-01-27 HISTORY — PX: BIOPSY: SHX5522

## 2020-01-27 HISTORY — PX: SUBMUCOSAL TATTOO INJECTION: SHX6856

## 2020-01-27 HISTORY — PX: ENDOSCOPIC MUCOSAL RESECTION: SHX6839

## 2020-01-27 LAB — GLUCOSE, CAPILLARY: Glucose-Capillary: 121 mg/dL — ABNORMAL HIGH (ref 70–99)

## 2020-01-27 SURGERY — RESECTION, MUCOSAL LESION, GI TRACT, ENDOSCOPIC
Anesthesia: Monitor Anesthesia Care

## 2020-01-27 MED ORDER — PROPOFOL 500 MG/50ML IV EMUL
INTRAVENOUS | Status: DC | PRN
  Administered 2020-01-27: 100 ug/kg/min via INTRAVENOUS

## 2020-01-27 MED ORDER — GLUCAGON HCL RDNA (DIAGNOSTIC) 1 MG IJ SOLR
INTRAMUSCULAR | Status: DC | PRN
Start: 2020-01-27 — End: 2020-01-27
  Administered 2020-01-27 (×2): .25 mg via INTRAVENOUS

## 2020-01-27 MED ORDER — SPOT INK MARKER SYRINGE KIT
PACK | SUBMUCOSAL | Status: DC | PRN
  Administered 2020-01-27: 2 mL via SUBMUCOSAL

## 2020-01-27 MED ORDER — LIDOCAINE HCL (CARDIAC) PF 100 MG/5ML IV SOSY
PREFILLED_SYRINGE | INTRAVENOUS | Status: DC | PRN
Start: 2020-01-27 — End: 2020-01-27
  Administered 2020-01-27: 30 mg via INTRAVENOUS

## 2020-01-27 MED ORDER — SODIUM CHLORIDE 0.9 % IV SOLN: INTRAVENOUS | Status: DC

## 2020-01-27 MED ORDER — OMEPRAZOLE 40 MG PO CPDR: 40.0000 mg | DELAYED_RELEASE_CAPSULE | Freq: Two times a day (BID) | ORAL | 1 refills | Status: DC

## 2020-01-27 MED ORDER — LACTATED RINGERS IV SOLN
INTRAVENOUS | Status: DC
  Administered 2020-01-27: 1000 mL via INTRAVENOUS

## 2020-01-27 MED ORDER — SODIUM CHLORIDE BACTERIOSTATIC 0.9 % IJ SOLN
INTRAMUSCULAR | Status: DC | PRN
  Administered 2020-01-27: 1 mL via SUBMUCOSAL

## 2020-01-27 MED ORDER — PROPOFOL 10 MG/ML IV BOLUS
INTRAVENOUS | Status: DC | PRN
  Administered 2020-01-27 (×2): 30 mg via INTRAVENOUS

## 2020-01-27 SURGICAL SUPPLY — 14 items

## 2020-01-27 NOTE — Discharge Instructions (Signed)
YOU HAD AN ENDOSCOPIC PROCEDURE TODAY: Refer to the procedure report and other information in the discharge instructions given to you for any specific questions about what was found during the examination. If this information does not answer your questions, please call [place] office at [phone] to clarify.   YOU SHOULD EXPECT: Some feelings of bloating in the abdomen. Passage of more gas than usual. Walking can help get rid of the air that was put into your GI tract during the procedure and reduce the bloating. If you had a lower endoscopy (such as a colonoscopy or flexible sigmoidoscopy) you may notice spotting of blood in your stool or on the toilet paper. Some abdominal soreness may be present for a day or two, also.  DIET: Your first meal following the procedure should be a light meal and then it is ok to progress to your normal diet. A half-sandwich or bowl of soup is an example of a good first meal. Heavy or fried foods are harder to digest and may make you feel nauseous or bloated. Drink plenty of fluids but you should avoid alcoholic beverages for 24 hours. If you had a esophageal dilation, please see attached instructions for diet.    ACTIVITY: Your care partner should take you home directly after the procedure. You should plan to take it easy, moving slowly for the rest of the day. You can resume normal activity the day after the procedure however YOU SHOULD NOT DRIVE, use power tools, machinery or perform tasks that involve climbing or major physical exertion for 24 hours (because of the sedation medicines used during the test).   SYMPTOMS TO REPORT IMMEDIATELY: A gastroenterologist can be reached at any hour. Please call [phone]  for any of the following symptoms:   Following upper endoscopy (EGD, EUS, ERCP, esophageal dilation) Vomiting of blood or coffee ground material  New, significant abdominal pain  New, significant chest pain or pain under the shoulder blades  Painful or  persistently difficult swallowing  New shortness of breath  Black, tarry-looking or red, bloody stools  FOLLOW UP:  If any biopsies were taken you will be contacted by phone or by letter within the next 1-3 weeks. Call [phone]  if you have not heard about the biopsies in 3 weeks.  Please also call with any specific questions about appointments or follow up tests.  

## 2020-01-27 NOTE — Transfer of Care (Signed)
Immediate Anesthesia Transfer of Care Note  Patient: Nathaniel Gill  Procedure(s) Performed: ENDOSCOPIC MUCOSAL RESECTION (N/A ) BIOPSY SUBMUCOSAL LIFTING INJECTION SUBMUCOSAL TATTOO INJECTION HEMOSTASIS CLIP PLACEMENT ENTEROSCOPY (N/A )  Patient Location: PACU  Anesthesia Type:MAC  Level of Consciousness: awake and alert   Airway & Oxygen Therapy: Patient Spontanous Breathing and Patient connected to nasal cannula oxygen  Post-op Assessment: Report given to RN, Post -op Vital signs reviewed and stable and Patient moving all extremities  Post vital signs: Reviewed and stable  Last Vitals:  Vitals Value Taken Time  BP 111/69 01/27/20 1013  Temp    Pulse 63 01/27/20 1015  Resp 21 01/27/20 1015  SpO2 96 % 01/27/20 1015  Vitals shown include unvalidated device data.  Last Pain:  Vitals:   01/27/20 0757  TempSrc: Oral  PainSc: 0-No pain         Complications: No complications documented.

## 2020-01-27 NOTE — Anesthesia Procedure Notes (Addendum)
Procedure Name: MAC Date/Time: 01/27/2020 9:05 AM Performed by: Alain Marion, CRNA Pre-anesthesia Checklist: Patient identified, Emergency Drugs available, Suction available, Patient being monitored and Timeout performed Patient Re-evaluated:Patient Re-evaluated prior to induction Oxygen Delivery Method: Nasal cannula

## 2020-01-27 NOTE — Op Note (Signed)
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 Hilarie Fredrickson, MD, Unk Pinto Medicines:                Monitored Anesthesia Care Complications:            No immediate complications. Estimated Blood Loss:     Estimated blood loss was minimal. Procedure:                Pre-Anesthesia Assessment:                           - Prior to the procedure, a History and Physical                            was performed, and patient medications and                            allergies were reviewed. The patient's tolerance of                            previous anesthesia was also reviewed. The risks                            and benefits of the procedure and the sedation                            options and risks were discussed with the patient.                            All questions were answered, and informed consent                            was obtained. Prior Anticoagulants: The patient has                            taken no previous anticoagulant or antiplatelet                            agents except for aspirin. ASA Grade Assessment: II                            -  A patient with mild systemic disease. After                            reviewing the risks and benefits, the patient was                            deemed in satisfactory condition to undergo the                            procedure.                           After obtaining informed consent, the endoscope was                             passed under direct vision. Throughout the                            procedure, the patient's blood pressure, pulse, and                            oxygen saturations were monitored continuously. The                            [id number]-H190 ([id number] Olympus gastroscope was                            introduced through the mouth, and advanced to the                            second part of duodenum. The PCF-H190DL ([id number])                            Olympus pediatric colonscope was introduced through                            the mouth, and advanced to the proximal jejunum.                            After obtaining informed consent, the endoscope was                            passed under direct vision. Throughout the                            procedure, the patient's blood pressure, pulse, and                            oxygen saturations were monitored continuously.The                            small bowel enteroscopy was accomplished without  difficulty. The patient tolerated the procedure. Scope In: Scope Out: Findings:      A non-obstructing Schatzki ring was found at the gastroesophageal       junction.      No other gross lesions were noted in the entire esophagus.      A 3 cm hiatal hernia was present.      Localized moderate mucosal changes characterized by congestion, erythema       and granularity were found on the posterior wall of the gastric antrum.       Biopsies were taken with a cold forceps for histology to rule out       intestinal metaplasia.      No other gross mucosal lesions were noted in the entire examined stomach.      Dilated lacteal was found in the second portion of the duodenum near       ampullary region.      A single 18 mm semi-sessile polyp with no bleeding was found in the       second/third portion of the duodenum. The area surrounding the polyp had       some polypoid satellite appearing lesion. Using white light  and then NBI       to demarcate the extent of the polyp tissue, preparations were made for       mucosal resection. Orise gel was injected to raise the lesion. Piecemeal       mucosal resection using a snare was performed. Resection and retrieval       were complete. To prevent bleeding after mucosal resection, six       hemostatic clips were successfully placed (MR conditional). There was no       bleeding at the end of the procedure. Area superior to the polyp was       tattooed with an injection of Spot (carbon black).      Otherwise normal mucosa was found in the visualized duodenum.      Normal mucosa was found in the proximal jejunum. Impression:               - No gross esophageal mucosal lesions.                           - Non-obstructing Schatzki ring.                           - 3 cm hiatal hernia.                           - Congested, erythematous and granular mucosa in                            the posterior wall of the gastric antrum. Biopsied.                           - Dilated lacteal was found in the duodenum.                           - A single duodenal polyp. Resected and retrieved                            via mucosal  resection. Clips (MR conditional) were                            placed. Tattooed area superior to polyp.                           - Otherwise, normal mucosa was found in the entire                            examined duodenum.                           - Normal mucosa was found in the visualized                            proximal jejunum. Recommendation:           - The patient will be observed post-procedure,                            until all discharge criteria are met.                           - Discharge patient to home.                           - Patient has a contact number available for                            emergencies. The signs and symptoms of potential                            delayed complications were discussed with the                             patient. Return to normal activities tomorrow.                            Written discharge instructions were provided to the                            patient.                           - Full liquid diet today and if tolerates then may                            advance diet tomorrow to soft diet for 48 hours and                            then normal thereafter.                           - Await pathology results.                           -  Increase Omeprazole to 40 mg twice daily for 1                            month and then may back down to Omeprazole 40 mg                            daily as already prescribed.                           - Monitor for signs/symptoms of                            bleeding/perforation/infection for next few weeks.                           - Repeat the small bowel enteroscopy in 6-12 months                            to check healing.                           - The findings and recommendations were discussed                            with the patient.                           - The findings and recommendations were discussed                            with the patient's family. Procedure Code(s):        --- Professional ---                           [phone], Small intestinal endoscopy, enteroscopy                            beyond second portion of duodenum, not including                            ileum; with removal of tumor(s), polyp(s), or other                            lesion(s) by snare technique                           44799, Unlisted procedure, small intestine Diagnosis Code(s):        --- Professional ---                           K22.2, Esophageal obstruction                           K44.9, Diaphragmatic hernia without obstruction or  gangrene                           K31.89, Other diseases of stomach and duodenum                           K31.7, Polyp of stomach and duodenum                            D13.2, Benign neoplasm of duodenum CPT copyright 2019 [employer]. All rights reserved. The codes documented in this report are preliminary and upon coder review may  be revised to meet current compliance requirements. [name], MD 01/27/2020 10:22:48 AM Number of Addenda: 0

## 2020-01-27 NOTE — Anesthesia Postprocedure Evaluation (Signed)
Anesthesia Post Note  Patient: [name]  Procedure(s) Performed: ENDOSCOPIC MUCOSAL RESECTION (N/A ) BIOPSY SUBMUCOSAL LIFTING INJECTION SUBMUCOSAL TATTOO INJECTION HEMOSTASIS CLIP PLACEMENT ENTEROSCOPY (N/A )     Patient location during evaluation: PACU Anesthesia Type: MAC Level of consciousness: awake and alert Pain management: pain level controlled Vital Signs Assessment: post-procedure vital signs reviewed and stable Respiratory status: spontaneous breathing, nonlabored ventilation and respiratory function stable Cardiovascular status: stable and blood pressure returned to baseline Postop Assessment: no apparent nausea or vomiting Anesthetic complications: no   No complications documented.  Last Vitals:  Vitals:   01/27/20 1041 01/27/20 1042  BP:  (!) 129/80  Pulse:  62  Resp:  20  Temp: (!) 36.2 C   SpO2:  95%    Last Pain:  Vitals:   01/27/20 1042  TempSrc:   PainSc: 0-No pain                 Tyra Michelle,W. EDMOND

## 2020-01-27 NOTE — H&P (Signed)
GASTROENTEROLOGY PROCEDURE H&P NOTE   Primary Care Physician: Unk Pinto, MD  HPI: Nathaniel Gill is a 69 y.o. male who presents for Duodenal Polyp resection attempt for adenoma noted on recent EGD.  Past Medical History:  Diagnosis Date  . Arthritis   . DDD (degenerative disc disease), lumbar   . ED (erectile dysfunction)   . GERD (gastroesophageal reflux disease)   . Hiatal hernia   . History of colon polyps   . Hyperlipidemia   . Hypertension    No sign of renal artery stenosis  . IBS (irritable bowel syndrome)   . RBBB   . Systolic murmur    no  current problems  . Vertigo   . Vitamin D deficiency    Past Surgical History:  Procedure Laterality Date  . ABDOMINAL SURGERY  1956   Related to bleeding episode.   Marland Kitchen CARDIAC CATHETERIZATION  2005   no intervention per patient  . COLONOSCOPY     several - Last one 11/22/19  . CYST REMOVAL HAND Left 1975   [place]m  . SKIN LESION EXCISION    . STAPEDECTOMY Bilateral    x 2 - 10 yrs apart - No MRIs per patient.  . TRANSTHORACIC ECHOCARDIOGRAM  07/2017   Normal LV size and function.  EF 55-60%.  No RWMA.  Basal septal hypertrophy, but no sign of significant hypertensive heart disease.  Only GR 1 DD.  Marland Kitchen TRIGGER FINGER RELEASE Right 2014   Thumb  . UPPER GI ENDOSCOPY     several - last one 11/22/2019  . WISDOM TOOTH EXTRACTION     No current facility-administered medications for this encounter.   No Known Allergies Family History  Problem Relation Age of Onset  . Heart disease Mother        Atrial flutter  . COPD Mother   . Heart failure Mother   . Kidney disease Father   . Arrhythmia Brother   . Heart attack Brother   . Hypertension Brother   . Hyperlipidemia Brother   . Hypertension Brother   . Hyperlipidemia Brother   . Esophageal cancer Neg Hx   . Colon cancer Neg Hx    Social History   Socioeconomic History  . Marital status: Married    Spouse name: Not on file  . Number of children: Not on  file  . Years of education: Not on file  . Highest education level: Not on file  Occupational History  . Not on file  Tobacco Use  . Smoking status: Never Smoker  . Smokeless tobacco: Never Used  Vaping Use  . Vaping Use: Never used  Substance and Sexual Activity  . Alcohol use: Yes    Comment: occasional wine  . Drug use: No    Comment: In 34s - cocaine, marijuana  . Sexual activity: Yes    Partners: Female    Birth control/protection: None  Other Topics Concern  . Not on file  Social History Narrative  . Not on file   Social Determinants of Health   Financial Resource Strain:   . Difficulty of Paying Living Expenses:   Food Insecurity:   . Worried About Charity fundraiser in the Last Year:   . Arboriculturist in the Last Year:   Transportation Needs:   . Film/video editor (Medical):   Marland Kitchen Lack of Transportation (Non-Medical):   Physical Activity: Insufficiently Active  . Days of Exercise per Week: 7 days  . Minutes of  Exercise per Session: 20 min  Stress: No Stress Concern Present  . Feeling of Stress : Only a little  Social Connections:   . Frequency of Communication with Friends and Family:   . Frequency of Social Gatherings with Friends and Family:   . Attends Religious Services:   . Active Member of Clubs or Organizations:   . Attends Archivist Meetings:   Marland Kitchen Marital Status:   Intimate Partner Violence:   . Fear of Current or Ex-Partner:   . Emotionally Abused:   Marland Kitchen Physically Abused:   . Sexually Abused:     Physical Exam: Vital signs in last 24 hours:     GEN: NAD EYE: Sclerae anicteric ENT: MMM CV: Non-tachycardic GI: Soft, NT/ND NEURO:  Alert & Oriented x 3  Lab Results: No results for input(s): WBC, HGB, HCT, PLT in the last 72 hours. BMET No results for input(s): NA, K, CL, CO2, GLUCOSE, BUN, CREATININE, CALCIUM in the last 72 hours. LFT No results for input(s): PROT, ALBUMIN, AST, ALT, ALKPHOS, BILITOT, BILIDIR, IBILI in  the last 72 hours. PT/INR No results for input(s): LABPROT, INR in the last 72 hours.   Impression / Plan: This is a 69 y.o.male who presents for Duodenal Polyp resection attempt for adenoma noted on recent EGD.  Based upon the description and endoscopic pictures I do feel that it is reasonable to pursue an Advanced Polypectomy attempt of the polyp/lesion.  We discussed some of the techniques of advanced polypectomy which include Endoscopic Mucosal Resection, Endorotor Morcellation, and Tissue Ablation via Fulguration.  The risks and benefits of endoscopic evaluation were discussed with the patient; these include but are not limited to the risk of perforation, infection, bleeding, missed lesions, lack of diagnosis, severe illness requiring hospitalization, as well as anesthesia and sedation related illnesses.  During attempts at advanced resection, the risks of bleeding and perforation/leak are increased as opposed to diagnostic and screening procedures, and that was discussed with the patient as well.   In addition, I explained that with the possible need for piecemeal resection, subsequent short-interval endoscopic evaluation for follow up and potential retreatment of the lesion/area may be necessary.  If, after attempt at removal of the polyp/lesion, it is found that the patient has a complication or that an invasive lesion or malignant lesion is found, or that the polyp/lesion continues to recur, the patient is aware and understands that surgery may still be indicated/required.    The risks and benefits of endoscopic evaluation were discussed with the patient; these include but are not limited to the risk of perforation, infection, bleeding, missed lesions, lack of diagnosis, severe illness requiring hospitalization, as well as anesthesia and sedation related illnesses.  The patient is agreeable to proceed.    Nathaniel Britain, MD [facility] Advanced Endoscopy Office #  [phone]

## 2020-01-28 ENCOUNTER — Encounter (HOSPITAL_COMMUNITY): Payer: Self-pay | Admitting: Gastroenterology

## 2020-01-28 LAB — SURGICAL PATHOLOGY

## 2020-02-02 ENCOUNTER — Encounter: Payer: Self-pay | Admitting: Gastroenterology

## 2020-02-11 ENCOUNTER — Telehealth: Payer: Self-pay | Admitting: *Deleted

## 2020-02-11 NOTE — Telephone Encounter (Signed)
A message was left, re: his follow up visit with Dr.Harding.

## 2020-03-04 ENCOUNTER — Other Ambulatory Visit: Payer: Self-pay | Admitting: Cardiology

## 2020-03-04 ENCOUNTER — Other Ambulatory Visit: Payer: Self-pay | Admitting: Gastroenterology

## 2020-03-04 ENCOUNTER — Other Ambulatory Visit: Payer: Self-pay | Admitting: Adult Health

## 2020-03-06 HISTORY — PX: CARPAL TUNNEL RELEASE: SHX101

## 2020-03-12 DIAGNOSIS — K317 Polyp of stomach and duodenum: Secondary | ICD-10-CM | POA: Insufficient documentation

## 2020-03-12 NOTE — Progress Notes (Signed)
FOLLOW UP  Assessment and Plan:   Atherosclerosis of aorta Per CXR 07/2018 Control blood pressure, cholesterol, glucose, increase exercise.   Hypertension Well controlled with current agents Monitor blood pressure at home; patient to call if consistently greater than 130/80 Continue DASH diet.   Reminder to go to the ER if any CP, SOB, nausea, dizziness, severe HA, changes vision/speech, left arm numbness and tingling and jaw pain.  Hyperlipidemia associated with T2DM ([place]) Mildly above goal with rosuvastatin 40 mg daily, working on lifestyle LDL goal <70 Continue low cholesterol diet and exercise.  Check lipid panel.   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  Check A1C  CKD II associated with T2DM ([facility]) Increase fluids, avoid NSAIDS, monitor sugars, will monitor CMP/GFR  Obesity - BMI 30 Long discussion about weight loss, diet, and exercise Recommended diet heavy in fruits and veggies and low in animal meats, cheeses, and dairy products, appropriate calorie intake Discussed ideal weight for height; weight loss goal <220lb - discussed GLP1i, initiated ozempic, d/c'd phentermine Continue exercise, portions;  Will follow up in 3 months  Vitamin D Def Below goal at last visit; has increased dose continue supplementation to maintain goal of 60-100 Check Vit D level at CPE  GERD/esophageal stricture/hiatal hernia/ duodenoal polyp Continue PPI; s/p dilation; follow up as recommended by Dr. Hilarie Fredrickson and PRN Discussed small hiatal hernia will monitor unless severe sx not relieved 6 month follow up EGD will be due Jan/Feb 2021  Continue diet and meds as discussed. Further  disposition pending results of labs. Discussed med's effects and SE's.   Over 30 minutes of exam, counseling, chart review, and critical decision making was performed.   Future Appointments  Date Time Provider [place]  03/24/2020  3:40 PM Leonie Man, MD [facility]  06/16/2020  9:00 AM Liane Comber, NP [facility] None    ----------------------------------------------------------------------------------------------------------------------  HPI 69 y.o. male  presents for 3 month follow up on hypertension, cholesterol, T2DM with CKD, ED, weight and vitamin D deficiency.   He recently underwent L carpal tunnel releast on 03/06/2020 by Dr. Amedeo Plenty and reports went well - will have stitches out this week, will start PT.   he has a diagnosis of GERD, He had colonoscopy and EGD by Dr. 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 Rush Landmark with 6 month follow up recommended. Colonoscopy showed several adenomatous polyps recommended for 3 year follow up. Continue omeprazole 40 mg daily, and reports has elevated HOB 2 inches with fully resolved GERD.   he is prescribed phentermine for weight loss, but has been off for several weeks.  While on the medication they have lost 0 lbs since last visit. They deny palpitations, anxiety, trouble sleeping, elevated BP.   BMI is Body mass index is 30.56 kg/m., he is working on diet and exercise. Eating healthy choice meals and smaller portions, less snacking, out of Y since covid 19, has been working out daily at home with new gym system.  He drinks mainly water, refills 12 ounce cup x 6 daily =72 ounces.  Tracking weight, home  scale body fat reading is 18%.  He is doing his best with exercise at home, weights; waist size down to 36.  Was on phentermine for some time but without perceived benefit  Reports on  home scale has been down to 222 lb.  Wt Readings from Last 3 Encounters:  03/16/20 231 lb 9.6 oz (105.1 kg)  01/24/20 (!) 230 lb (104.3 kg)  12/27/19 231 lb (104.8 kg)   He is followed by Dr. 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  He had essentially normal ECHO in 07/2017 obtained for murmur evaluation; attributed to mild septal thickening.   He has aortic atherosclerosis per CXR 07/2018.  His blood pressure has been controlled at home, today their BP is BP: 116/76  He does workout. He denies chest pain, shortness of breath, dizziness.   He is on cholesterol medication (rosuvastatin 40 mg daily) and denies myalgias. His cholesterol is not at goal. The cholesterol last visit was:   Lab Results  Component Value Date   CHOL 135 12/27/2019   HDL 41 12/27/2019   LDLCALC 76 12/27/2019   TRIG 95 12/27/2019   CHOLHDL 3.3 12/27/2019    He has been working on diet and exercise for T2 diabetes (A1C 6.5% in 10/2018, 6.6% in 02/2019) currently treated by metformin 500 mg BID, and denies foot ulcerations, increased appetite, nausea, paresthesia of the feet, polydipsia, polyuria, visual disturbances, vomiting and weight loss. He has ED treated by sildenafil PRN.  Last A1C in the office was:  Lab Results  Component Value Date   HGBA1C 6.5 (H) 12/27/2019    He has CKD II associated with T2DM monitored q34m at this office:  Lab Results  Component Value Date   GFRNONAA 81 12/27/2019   Patient is on Vitamin D supplement, taking 4000 IU daily Lab Results  Component Value Date   VD25OH 35 05/20/2019      \ Current Medications:  Current Outpatient Medications on File Prior to Visit  Medication Sig  . amLODipine (NORVASC) 10 MG tablet Take 1 tablet Daily for BP (Patient taking differently: Take 10 mg by mouth daily. Take 1 tablet Daily for BP)  . Ascorbic Acid (VITAMIN C) 1000 MG tablet Take 1,000 mg by mouth daily.  Marland Kitchen aspirin 81 MG tablet Take 81 mg by mouth daily.  .  carvedilol (COREG) 12.5 MG tablet TAKE ONE TABLET BY MOUTH TWICE A DAY  . Cholecalciferol 50 MCG (2000 UT) CAPS Take 2,000 Units by mouth in the morning and at bedtime.   . gabapentin (NEURONTIN) 300 MG capsule TAKE ONE CAPSULE BY MOUTH THREE TIMES A DAY AS NEEDED  . metFORMIN (GLUCOPHAGE-XR) 500 MG 24 hr tablet Start taking 1 tab daily with largest meal of the day for blood sugar and weight loss; then if tolerating increase to twice daily in 2 weeks (Patient taking differently: Take 500 mg by mouth in the morning and at bedtime. Take one tablet twice daily)  . olmesartan-hydrochlorothiazide (BENICAR HCT) 40-25 MG tablet Take 1 tablet by mouth daily.  Marland Kitchen omeprazole (PRILOSEC) 40 MG capsule TAKE ONE CAPSULE BY MOUTH TWICE A DAY BEFORE MEALS TO REDUCE STOMACH ACID  . rosuvastatin (CRESTOR) 40 MG tablet Take 1 tablet Daily for Cholesterol (Patient taking differently: Take 40 mg by mouth daily. Take 1 tablet Daily for Cholesterol)  . sildenafil (VIAGRA) 100 MG tablet Take 1/4-1/2 tab as needed.  . vitamin E 200 UNIT capsule Take 200 Units by mouth 2 (two) times daily.  Marland Kitchen  Zinc 50 MG TABS Take 50 mg by mouth daily.   . minoxidil (LONITEN) 10 MG tablet Take 1/2 tablet at Bedtime for BP (Patient not taking: Reported on 03/16/2020)  . OVER THE COUNTER MEDICATION Balance of Nature- Fruits and Veggies, Take 3 tablets once daily   No current facility-administered medications on file prior to visit.     Allergies: No Known Allergies   Medical History:  Past Medical History:  Diagnosis Date  . Arthritis   . DDD (degenerative disc disease), lumbar   . ED (erectile dysfunction)   . GERD (gastroesophageal reflux disease)   . Hiatal hernia   . History of colon polyps   . Hyperlipidemia   . Hypertension    No sign of renal artery stenosis  . IBS (irritable bowel syndrome)   . RBBB   . Systolic murmur    no  current problems  . Vertigo   . Vitamin D deficiency    Family history- Reviewed and  unchanged Social history- Reviewed and unchanged   Review of Systems:  Review of Systems  Constitutional: Negative for malaise/fatigue and weight loss.  HENT: Negative for hearing loss and tinnitus.   Eyes: Negative for blurred vision and double vision.  Respiratory: Negative for cough, shortness of breath and wheezing.   Cardiovascular: Negative for chest pain, palpitations, orthopnea, claudication and leg swelling.  Gastrointestinal: Negative for abdominal pain, blood in stool, constipation, diarrhea, heartburn, melena, nausea and vomiting.  Genitourinary: Negative.   Musculoskeletal: Negative for joint pain and myalgias.  Skin: Negative for rash.  Neurological: Negative for dizziness, tingling, sensory change, weakness and headaches.  Endo/Heme/Allergies: Negative for polydipsia.  Psychiatric/Behavioral: Negative.   All other systems reviewed and are negative.   Physical Exam: BP 116/76   Pulse 72   Temp (!) 96.9 F (36.1 C)   Resp 16   Wt 231 lb 9.6 oz (105.1 kg)   BMI 30.56 kg/m  Wt Readings from Last 3 Encounters:  03/16/20 231 lb 9.6 oz (105.1 kg)  01/24/20 (!) 230 lb (104.3 kg)  12/27/19 231 lb (104.8 kg)   General Appearance: Well nourished, in no apparent distress. Eyes: PERRLA, EOMs, conjunctiva no swelling or erythema Sinuses: No Frontal/maxillary tenderness ENT/Mouth: Ext aud canals clear, TMs without erythema, bulging. No erythema, swelling, or exudate on post pharynx. Tonsils not swollen or erythematous. Hearing normal.  Neck: Supple, thyroid normal.  Respiratory: Respiratory effort normal, BS equal bilaterally without rales, rhonchi, wheezing or stridor.  Cardio: RRR with no MRGs. Brisk peripheral pulses without edema.  Abdomen: Soft, + BS.  Non tender, no guarding, rebound, hernias, masses. Lymphatics: Non tender without lymphadenopathy.  Musculoskeletal: Full ROM, 5/5 strength, Normal gait. Left wrist with ACE wrap in place Skin: Warm, dry without  rashes, lesions, ecchymosis.  Neuro: Cranial nerves intact. No cerebellar symptoms.  Psych: Awake and oriented X 3, normal affect, Insight and Judgment appropriate.    Izora Ribas, NP 8:52 AM [facility]

## 2020-03-16 ENCOUNTER — Encounter: Payer: Self-pay | Admitting: Adult Health

## 2020-03-16 ENCOUNTER — Other Ambulatory Visit: Payer: Self-pay

## 2020-03-16 ENCOUNTER — Ambulatory Visit: Payer: 59 | Admitting: Adult Health

## 2020-03-16 VITALS — BP 116/76 | HR 72 | Temp 96.9°F | Resp 16 | Wt 231.6 lb

## 2020-03-16 DIAGNOSIS — I700 Atherosclerosis of aorta: Secondary | ICD-10-CM

## 2020-03-16 DIAGNOSIS — E1169 Type 2 diabetes mellitus with other specified complication: Secondary | ICD-10-CM

## 2020-03-16 DIAGNOSIS — Z79899 Other long term (current) drug therapy: Secondary | ICD-10-CM

## 2020-03-16 DIAGNOSIS — E669 Obesity, unspecified: Secondary | ICD-10-CM

## 2020-03-16 DIAGNOSIS — E559 Vitamin D deficiency, unspecified: Secondary | ICD-10-CM

## 2020-03-16 DIAGNOSIS — N521 Erectile dysfunction due to diseases classified elsewhere: Secondary | ICD-10-CM

## 2020-03-16 DIAGNOSIS — N182 Chronic kidney disease, stage 2 (mild): Secondary | ICD-10-CM

## 2020-03-16 DIAGNOSIS — E1122 Type 2 diabetes mellitus with diabetic chronic kidney disease: Secondary | ICD-10-CM | POA: Diagnosis not present

## 2020-03-16 DIAGNOSIS — E785 Hyperlipidemia, unspecified: Secondary | ICD-10-CM

## 2020-03-16 DIAGNOSIS — I10 Essential (primary) hypertension: Secondary | ICD-10-CM | POA: Diagnosis not present

## 2020-03-16 DIAGNOSIS — K317 Polyp of stomach and duodenum: Secondary | ICD-10-CM

## 2020-03-16 MED ORDER — OZEMPIC (0.25 OR 0.5 MG/DOSE) 2 MG/1.5ML ~~LOC~~ SOPN: PEN_INJECTOR | SUBCUTANEOUS | 0 refills | Status: DC

## 2020-03-16 NOTE — Patient Instructions (Addendum)
Goals    . Blood Pressure < 130/80    . DIET - INCREASE WATER INTAKE     Recommend 65-80 fluid ounces daily     . Exercise 150 min/wk Moderate Activity    . LDL CALC < 100    . Weight (lb) < 220 lb (99.8 kg)       Stop minoxidil, metformin, phentermine, gabapentin for now  Start ozempic 0.25 mg weekly x 4 weeks, then 0.5 mg x 4 weeks - then contact me if tolerating well   Check diet for fiber intake     High-Fiber Diet Fiber, also called dietary fiber, is a type of carbohydrate that is found in fruits, vegetables, whole grains, and beans. A high-fiber diet can have many health benefits. Your health care provider may recommend a high-fiber diet to help:  Prevent constipation. Fiber can make your bowel movements more regular.  Lower your cholesterol.  Relieve the following conditions: ? Swelling of veins in the anus (hemorrhoids). ? Swelling and irritation (inflammation) of specific areas of the digestive tract (uncomplicated diverticulosis). ? A problem of the large intestine (colon) that sometimes causes pain and diarrhea (irritable bowel syndrome, IBS).  Prevent overeating as part of a weight-loss plan.  Prevent heart disease, type 2 diabetes, and certain cancers. What is my plan? The recommended daily fiber intake in grams (g) includes:  38 g for men age 59 or younger.  30 g for men over age 43.  18 g for women age 44 or younger.  21 g for women over age 57. You can get the recommended daily intake of dietary fiber by:  Eating a variety of fruits, vegetables, grains, and beans.  Taking a fiber supplement, if it is not possible to get enough fiber through your diet. What do I need to know about a high-fiber diet?  It is better to get fiber through food sources rather than from fiber supplements. There is not a lot of research about how effective supplements are.  Always check the fiber content on the nutrition facts label of any prepackaged food. Look for foods  that contain 5 g of fiber or more per serving.  Talk with a diet and nutrition specialist (dietitian) if you have questions about specific foods that are recommended or not recommended for your medical condition, especially if those foods are not listed below.  Gradually increase how much fiber you consume. If you increase your intake of dietary fiber too quickly, you may have bloating, cramping, or gas.  Drink plenty of water. Water helps you to digest fiber. What are tips for following this plan?  Eat a wide variety of high-fiber foods.  Make sure that half of the grains that you eat each day are whole grains.  Eat breads and cereals that are made with whole-grain flour instead of refined flour or white flour.  Eat brown rice, bulgur wheat, or millet instead of white rice.  Start the day with a breakfast that is high in fiber, such as a cereal that contains 5 g of fiber or more per serving.  Use beans in place of meat in soups, salads, and pasta dishes.  Eat high-fiber snacks, such as berries, raw vegetables, nuts, and popcorn.  Choose whole fruits and vegetables instead of processed forms like juice or sauce. What foods can I eat?  Fruits Berries. Pears. Apples. Oranges. Avocado. Prunes and raisins. Dried figs. Vegetables Sweet potatoes. Spinach. Kale. Artichokes. Cabbage. Broccoli. Cauliflower. Green peas. Carrots. Squash. Grains  Whole-grain breads. Multigrain cereal. Oats and oatmeal. Brown rice. Barley. Bulgur wheat. [place]. Quinoa. Bran muffins. Popcorn. Rye wafer crackers. Meats and other proteins Navy, kidney, and pinto beans. Soybeans. Split peas. Lentils. Nuts and seeds. Dairy Fiber-fortified yogurt. Beverages Fiber-fortified soy milk. Fiber-fortified orange juice. Other foods Fiber bars. The items listed above may not be a complete list of recommended foods and beverages. Contact a dietitian for more options. What foods are not recommended? Fruits Fruit juice.  Cooked, strained fruit. Vegetables Fried potatoes. Canned vegetables. Well-cooked vegetables. Grains White bread. Pasta made with refined flour. White rice. Meats and other proteins Fatty cuts of meat. Fried chicken or fried fish. Dairy Milk. Yogurt. Cream cheese. Sour cream. Fats and oils Butters. Beverages Soft drinks. Other foods Cakes and pastries. The items listed above may not be a complete list of foods and beverages to avoid. Contact a dietitian for more information. Summary  Fiber is a type of carbohydrate. It is found in fruits, vegetables, whole grains, and beans.  There are many health benefits of eating a high-fiber diet, such as preventing constipation, lowering blood cholesterol, helping with weight loss, and reducing your risk of heart disease, diabetes, and certain cancers.  Gradually increase your intake of fiber. Increasing too fast can result in cramping, bloating, and gas. Drink plenty of water while you increase your fiber.  The best sources of fiber include whole fruits and vegetables, whole grains, nuts, seeds, and beans. This information is not intended to replace advice given to you by your health care provider. Make sure you discuss any questions you have with your health care provider. Document Revised: 04/24/2017 Document Reviewed: 04/24/2017 Elsevier Patient Education  [place].    Semaglutide injection solution What is this medicine? SEMAGLUTIDE (Sem a GLOO tide) is used to improve blood sugar control in adults with type 2 diabetes. This medicine may be used with other diabetes medicines. This drug may also reduce the risk of heart attack or stroke if you have type 2 diabetes and risk factors for heart disease. This medicine may be used for other purposes; ask your health care provider or pharmacist if you have questions. COMMON BRAND NAME(S): OZEMPIC What should I tell my health care provider before I take this medicine? They need to know  if you have any of these conditions:  endocrine tumors (MEN 2) or if someone in your family had these tumors  eye disease, vision problems  history of pancreatitis  kidney disease  stomach problems  thyroid cancer or if someone in your family had thyroid cancer  an unusual or allergic reaction to semaglutide, other medicines, foods, dyes, or preservatives  pregnant or trying to get pregnant  breast-feeding How should I use this medicine? This medicine is for injection under the skin of your upper leg (thigh), stomach area, or upper arm. It is given once every week (every 7 days). You will be taught how to prepare and give this medicine. Use exactly as directed. Take your medicine at regular intervals. Do not take it more often than directed. If you use this medicine with insulin, you should inject this medicine and the insulin separately. Do not mix them together. Do not give the injections right next to each other. Change (rotate) injection sites with each injection. It is important that you put your used needles and syringes in a special sharps container. Do not put them in a trash can. If you do not have a sharps container, call your pharmacist or healthcare  provider to get one. A special MedGuide will be given to you by the pharmacist with each prescription and refill. Be sure to read this information carefully each time. This drug comes with INSTRUCTIONS FOR USE. Ask your pharmacist for directions on how to use this drug. Read the information carefully. Talk to your pharmacist or health care provider if you have questions. Talk to your pediatrician regarding the use of this medicine in children. Special care may be needed. Overdosage: If you think you have taken too much of this medicine contact a poison control center or emergency room at once. NOTE: This medicine is only for you. Do not share this medicine with others. What if I miss a dose? If you miss a dose, take it as soon as  you can within 5 days after the missed dose. Then take your next dose at your regular weekly time. If it has been longer than 5 days after the missed dose, do not take the missed dose. Take the next dose at your regular time. Do not take double or extra doses. If you have questions about a missed dose, contact your health care provider for advice. What may interact with this medicine?  other medicines for diabetes Many medications may cause changes in blood sugar, these include:  alcohol containing beverages  antiviral medicines for HIV or AIDS  aspirin and aspirin-like drugs  certain medicines for blood pressure, heart disease, irregular heart beat  chromium  diuretics  male hormones, such as estrogens or progestins, birth control pills  fenofibrate  gemfibrozil  isoniazid  lanreotide  male hormones or anabolic steroids  MAOIs like Carbex, Eldepryl, Marplan, Nardil, and Parnate  medicines for weight loss  medicines for allergies, asthma, cold, or cough  medicines for depression, anxiety, or psychotic disturbances  niacin  nicotine  NSAIDs, medicines for pain and inflammation, like ibuprofen or naproxen  octreotide  pasireotide  pentamidine  phenytoin  probenecid  quinolone antibiotics such as ciprofloxacin, levofloxacin, ofloxacin  some herbal dietary supplements  steroid medicines such as prednisone or cortisone  sulfamethoxazole; trimethoprim  thyroid hormones Some medications can hide the warning symptoms of low blood sugar (hypoglycemia). You may need to monitor your blood sugar more closely if you are taking one of these medications. These include:  beta-blockers, often used for high blood pressure or heart problems (examples include atenolol, metoprolol, propranolol)  clonidine  guanethidine  reserpine This list may not describe all possible interactions. Give your health care provider a list of all the medicines, herbs,  non-prescription drugs, or dietary supplements you use. Also tell them if you smoke, drink alcohol, or use illegal drugs. Some items may interact with your medicine. What should I watch for while using this medicine? Visit your doctor or health care professional for regular checks on your progress. Drink plenty of fluids while taking this medicine. Check with your doctor or health care professional if you get an attack of severe diarrhea, nausea, and vomiting. The loss of too much body fluid can make it dangerous for you to take this medicine. A test called the HbA1C (A1C) will be monitored. This is a simple blood test. It measures your blood sugar control over the last 2 to 3 months. You will receive this test every 3 to 6 months. Learn how to check your blood sugar. Learn the symptoms of low and high blood sugar and how to manage them. Always carry a quick-source of sugar with you in case you have symptoms of low  blood sugar. Examples include hard sugar candy or glucose tablets. Make sure others know that you can choke if you eat or drink when you develop serious symptoms of low blood sugar, such as seizures or unconsciousness. They must get medical help at once. Tell your doctor or health care professional if you have high blood sugar. You might need to change the dose of your medicine. If you are sick or exercising more than usual, you might need to change the dose of your medicine. Do not skip meals. Ask your doctor or health care professional if you should avoid alcohol. Many nonprescription cough and cold products contain sugar or alcohol. These can affect blood sugar. Pens should never be shared. Even if the needle is changed, sharing may result in passing of viruses like hepatitis or HIV. Wear a medical ID bracelet or chain, and carry a card that describes your disease and details of your medicine and dosage times. Do not become pregnant while taking this medicine. Women should inform their  doctor if they wish to become pregnant or think they might be pregnant. There is a potential for serious side effects to an unborn child. Talk to your health care professional or pharmacist for more information. What side effects may I notice from receiving this medicine? Side effects that you should report to your doctor or health care professional as soon as possible:  allergic reactions like skin rash, itching or hives, swelling of the face, lips, or tongue  breathing problems  changes in vision  diarrhea that continues or is severe  lump or swelling on the neck  severe nausea  signs and symptoms of infection like fever or chills; cough; sore throat; pain or trouble passing urine  signs and symptoms of low blood sugar such as feeling anxious, confusion, dizziness, increased hunger, unusually weak or tired, sweating, shakiness, cold, irritable, headache, blurred vision, fast heartbeat, loss of consciousness  signs and symptoms of kidney injury like trouble passing urine or change in the amount of urine  trouble swallowing  unusual stomach upset or pain  vomiting Side effects that usually do not require medical attention (report to your doctor or health care professional if they continue or are bothersome):  constipation  diarrhea  nausea  pain, redness, or irritation at site where injected  stomach upset This list may not describe all possible side effects. Call your doctor for medical advice about side effects. You may report side effects to FDA at 1-800-FDA-1088. Where should I keep my medicine? Keep out of the reach of children. Store unopened pens in a refrigerator between 2 and 8 degrees C (36 and 46 degrees F). Do not freeze. Protect from light and heat. After you first use the pen, it can be stored for 56 days at room temperature between 15 and 30 degrees C (59 and 86 degrees F) or in a refrigerator. Throw away your used pen after 56 days or after the expiration  date, whichever comes first. Do not store your pen with the needle attached. If the needle is left on, medicine may leak from the pen. NOTE: This sheet is a summary. It may not cover all possible information. If you have questions about this medicine, talk to your doctor, pharmacist, or health care provider.  2020 Elsevier/Gold Standard (2019-03-05 09:41:51)

## 2020-03-17 ENCOUNTER — Other Ambulatory Visit: Payer: Self-pay | Admitting: Adult Health

## 2020-03-17 LAB — CBC WITH DIFFERENTIAL/PLATELET
Absolute Monocytes: 655 cells/uL (ref 200–950)
Basophils Absolute: 101 cells/uL (ref 0–200)
Basophils Relative: 2.4 %
Eosinophils Absolute: 260 cells/uL (ref 15–500)
Eosinophils Relative: 6.2 %
HCT: 43.1 % (ref 38.5–50.0)
Hemoglobin: 14.1 g/dL (ref 13.2–17.1)
Lymphs Abs: 1315 cells/uL (ref 850–3900)
MCH: 29.4 pg (ref 27.0–33.0)
MCHC: 32.7 g/dL (ref 32.0–36.0)
MCV: 89.8 fL (ref 80.0–100.0)
MPV: 9.6 fL (ref 7.5–12.5)
Monocytes Relative: 15.6 %
Neutro Abs: 1869 cells/uL (ref 1500–7800)
Neutrophils Relative %: 44.5 %
Platelets: 340 10*3/uL (ref 140–400)
RBC: 4.8 10*6/uL (ref 4.20–5.80)
RDW: 13.3 % (ref 11.0–15.0)
Total Lymphocyte: 31.3 %
WBC: 4.2 10*3/uL (ref 3.8–10.8)

## 2020-03-17 LAB — COMPLETE METABOLIC PANEL WITH GFR
AG Ratio: 2 (calc) (ref 1.0–2.5)
ALT: 34 U/L (ref 9–46)
AST: 36 U/L — ABNORMAL HIGH (ref 10–35)
Albumin: 4.6 g/dL (ref 3.6–5.1)
Alkaline phosphatase (APISO): 37 U/L (ref 35–144)
BUN: 19 mg/dL (ref 7–25)
CO2: 29 mmol/L (ref 20–32)
Calcium: 10.2 mg/dL (ref 8.6–10.3)
Chloride: 103 mmol/L (ref 98–110)
Creat: 0.99 mg/dL (ref 0.70–1.25)
GFR, Est African American: 91 mL/min/{1.73_m2} (ref >=60)
GFR, Est Non African American: 78 mL/min/{1.73_m2} (ref >=60)
Globulin: 2.3 g/dL (calc) (ref 1.9–3.7)
Glucose, Bld: 116 mg/dL — ABNORMAL HIGH (ref 65–99)
Potassium: 4.1 mmol/L (ref 3.5–5.3)
Sodium: 139 mmol/L (ref 135–146)
Total Bilirubin: 0.5 mg/dL (ref 0.2–1.2)
Total Protein: 6.9 g/dL (ref 6.1–8.1)

## 2020-03-17 LAB — LIPID PANEL
Cholesterol: 229 mg/dL — ABNORMAL HIGH (ref <200)
HDL: 34 mg/dL — ABNORMAL LOW (ref >=40)
LDL Cholesterol (Calc): 159 mg/dL (calc) — ABNORMAL HIGH
Non-HDL Cholesterol (Calc): 195 mg/dL (calc) — ABNORMAL HIGH (ref <130)
Total CHOL/HDL Ratio: 6.7 (calc) — ABNORMAL HIGH (ref <5.0)
Triglycerides: 203 mg/dL — ABNORMAL HIGH (ref <150)

## 2020-03-17 LAB — HEMOGLOBIN A1C
Hgb A1c MFr Bld: 6.4 % of total Hgb — ABNORMAL HIGH (ref <5.7)
Mean Plasma Glucose: 137 (calc)
eAG (mmol/L): 7.6 (calc)

## 2020-03-17 LAB — MAGNESIUM: Magnesium: 2.1 mg/dL (ref 1.5–2.5)

## 2020-03-17 LAB — TSH: TSH: 2.33 mIU/L (ref 0.40–4.50)

## 2020-03-17 MED ORDER — ROSUVASTATIN CALCIUM 40 MG PO TABS: ORAL_TABLET | ORAL | 1 refills | Status: DC

## 2020-03-24 ENCOUNTER — Ambulatory Visit (INDEPENDENT_AMBULATORY_CARE_PROVIDER_SITE_OTHER): Payer: 59 | Admitting: Cardiology

## 2020-03-24 ENCOUNTER — Other Ambulatory Visit: Payer: Self-pay

## 2020-03-24 ENCOUNTER — Encounter: Payer: Self-pay | Admitting: Cardiology

## 2020-03-24 VITALS — BP 128/74 | HR 60 | Ht 73.0 in | Wt 234.4 lb

## 2020-03-24 DIAGNOSIS — I10 Essential (primary) hypertension: Secondary | ICD-10-CM

## 2020-03-24 DIAGNOSIS — I4510 Unspecified right bundle-branch block: Secondary | ICD-10-CM | POA: Diagnosis not present

## 2020-03-24 DIAGNOSIS — E785 Hyperlipidemia, unspecified: Secondary | ICD-10-CM

## 2020-03-24 DIAGNOSIS — I700 Atherosclerosis of aorta: Secondary | ICD-10-CM | POA: Diagnosis not present

## 2020-03-24 DIAGNOSIS — E1169 Type 2 diabetes mellitus with other specified complication: Secondary | ICD-10-CM | POA: Diagnosis not present

## 2020-03-24 NOTE — Patient Instructions (Addendum)
Medication Instructions:  No changes *If you need a refill on your cardiac medications before your next appointment, please call your pharmacy*   Lab Work: Lipid Hepatic - DEC 2021 ( towards the end of the month )  Fasting If you have labs (blood work) drawn today and your tests are completely normal, you will receive your results only by: Marland Kitchen MyChart Message (if you have MyChart) OR . A paper copy in the mail If you have any lab test that is abnormal or we need to change your treatment, we will call you to review the results.   Testing/Procedures: Not needed   Follow-Up: At [facility], you and your health needs are our priority.  As part of our continuing mission to provide you with exceptional heart care, we have created designated Provider Care Teams.  These Care Teams include your primary Cardiologist (physician) and Advanced Practice Providers (APPs -  Physician Assistants and Nurse Practitioners) who all work together to provide you with the care you need, when you need it.     Your next appointment:   12 month(s)  The format for your next appointment:   In Person  Provider:   [name], MD

## 2020-03-24 NOTE — Progress Notes (Signed)
Primary Care Provider: Unk Pinto, MD Cardiologist: [name], MD Electrophysiologist: None  Clinic Note: Chief Complaint  Patient presents with  . Follow-up    Doing okay 1 episode of fast palpitations  . Hypertension  . Palpitations    HPI:    Nathaniel Gill is a 69 y.o. male with a PMH notable for resistant hypertension hyperlipidemia who presents today for annual follow-up.  Kru Allman was last seen on March 24, 2019 via telemedicine.  Was doing very well.  Blood pressures have been doing very well.  He was exercising at the [facility] up until the COVID-19 lockdown.  Was still trying to stay active. Noted that 2 older brothers were diagnosed with atrial fibrillation  Recent Hospitalizations: None  Reviewed  CV studies:    The following studies were reviewed today: (if available, images/films reviewed: From Epic Chart or Care Everywhere) . None:   Interval History:   Seanpatrick Maisano returns now for in person evaluation doing really well relatively well.  He has not been doing as well with his diet he was trying to adjust to maybe a more keto type diet and was eating more red meat than chicken and unfortunately did not followed up with increased exercise. He said that a few weeks ago he was doing RB camping not the beach on just doing the set up of the RV which usually does not take much effort really had an winded and felt tired.  He felt his heart rate really going fast and lasted almost the whole entire day and he just felt really worn out.  Other than that, he really has not had that much of the palpitations spells but not that frequent but when not long for spell happened he did feel quite lightheaded and dizzy.  He is try to do some exercise now at least 30 minutes in the morning and occasionally in the afternoon he was walking as well as yoga and meditation.  He does his recumbent bike when possible.  He is not doing the treadmill anymore because  his back is hurting.  CV Review of Symptoms (Summary): positive for - dyspnea on exertion, palpitations and rapid heart rate negative for - chest pain, edema, irregular heartbeat, orthopnea, paroxysmal nocturnal dyspnea, shortness of breath or Syncope/near syncope, TIA/amaurosis fugax.  He has had lightheadedness and dizziness  The patient does not have symptoms concerning for COVID-19 infection (fever, chills, cough, or new shortness of breath).   REVIEWED OF SYSTEMS   Review of Systems  Constitutional: Negative for malaise/fatigue and weight loss (Seems like his weight is bouncing up and down).  HENT: Negative for congestion.   Respiratory: Negative for cough and shortness of breath.   Gastrointestinal: Negative for blood in stool, constipation and melena.  Genitourinary: Negative for hematuria.  Musculoskeletal: Positive for back pain and joint pain.  Neurological: Positive for dizziness (With palpitations).  Psychiatric/Behavioral: Negative for memory loss. The patient is not nervous/anxious and does not have insomnia.    I have reviewed and (if needed) personally updated the patient's problem list, medications, allergies, past medical and surgical history, social and family history.   PAST MEDICAL HISTORY   Past Medical History:  Diagnosis Date  . Arthritis   . DDD (degenerative disc disease), lumbar   . ED (erectile dysfunction)   . GERD (gastroesophageal reflux disease)   . Hiatal hernia   . History of colon polyps   . Hyperlipidemia   . Hypertension  No sign of renal artery stenosis  . IBS (irritable bowel syndrome)   . RBBB   . Systolic murmur    no  current problems  . Vertigo   . Vitamin D deficiency     PAST SURGICAL HISTORY   Past Surgical History:  Procedure Laterality Date  . ABDOMINAL SURGERY  1956   Related to bleeding episode.   Marland Kitchen BIOPSY  01/27/2020   Procedure: BIOPSY;  Surgeon: Rush Landmark Telford Nab., MD;  Location: [place];  Service:  Gastroenterology;;  . CARDIAC CATHETERIZATION  2005   no intervention per patient  . CARPAL TUNNEL RELEASE Left 03/06/2020   Dr. Amedeo Plenty  . COLONOSCOPY     several - Last one 11/22/19  . CYST REMOVAL HAND Left 1975   [place]  . ENDOSCOPIC MUCOSAL RESECTION N/A 01/27/2020   Procedure: ENDOSCOPIC MUCOSAL RESECTION;  Surgeon: Rush Landmark Telford Nab., MD;  Location: [place];  Service: Gastroenterology;  Laterality: N/A;  . ENTEROSCOPY N/A 01/27/2020   Procedure: ENTEROSCOPY;  Surgeon: Rush Landmark Telford Nab., MD;  Location: [place];  Service: Gastroenterology;  Laterality: N/A;  . HEMOSTASIS CLIP PLACEMENT  01/27/2020   Procedure: HEMOSTASIS CLIP PLACEMENT;  Surgeon: Irving Copas., MD;  Location: [place];  Service: Gastroenterology;;  . SKIN LESION EXCISION    . STAPEDECTOMY Bilateral    x 2 - 10 yrs apart - No MRIs per patient.  . SUBMUCOSAL LIFTING INJECTION  01/27/2020   Procedure: SUBMUCOSAL LIFTING INJECTION;  Surgeon: Rush Landmark Telford Nab., MD;  Location: [place];  Service: Gastroenterology;;  . Lia Foyer TATTOO INJECTION  01/27/2020   Procedure: SUBMUCOSAL TATTOO INJECTION;  Surgeon: Irving Copas., MD;  Location: [place];  Service: Gastroenterology;;  . TRANSTHORACIC ECHOCARDIOGRAM  07/2017   Normal LV size and function.  EF 55-60%.  No RWMA.  Basal septal hypertrophy, but no sign of significant hypertensive heart disease.  Only GR 1 DD.  Marland Kitchen TRIGGER FINGER RELEASE Right 2014   Thumb  . UPPER GI ENDOSCOPY     several - last one 11/22/2019  . WISDOM TOOTH EXTRACTION      Immunization History  Administered Date(s) Administered  . Pneumococcal Conjugate-13 04/16/2018  . Pneumococcal Polysaccharide-23 05/20/2019    MEDICATIONS/ALLERGIES   Current Meds  Medication Sig  . amLODipine (NORVASC) 10 MG tablet Take 1 tablet Daily for BP (Patient taking differently: Take 10 mg by mouth daily. Take 1 tablet Daily for BP)  . Ascorbic Acid (VITAMIN C)  1000 MG tablet Take 1,000 mg by mouth daily.  Marland Kitchen aspirin 81 MG tablet Take 81 mg by mouth daily.  . carvedilol (COREG) 12.5 MG tablet TAKE ONE TABLET BY MOUTH TWICE A DAY  . Cholecalciferol 50 MCG (2000 UT) CAPS Take 2,000 Units by mouth in the morning and at bedtime.   Marland Kitchen olmesartan-hydrochlorothiazide (BENICAR HCT) 40-25 MG tablet Take 1 tablet by mouth daily.  Marland Kitchen omeprazole (PRILOSEC) 40 MG capsule TAKE ONE CAPSULE BY MOUTH TWICE A DAY BEFORE MEALS TO REDUCE STOMACH ACID  . rosuvastatin (CRESTOR) 40 MG tablet Take 1 tablet Daily for Cholesterol  . Semaglutide,0.25 or 0.5MG /DOS, (OZEMPIC, 0.25 OR 0.5 MG/DOSE,) 2 MG/1.5ML SOPN Inject 0.25 mg subcutaneously once weekly for 4 weeks, then 0.5 mg weekly for 4 weeks.  . sildenafil (VIAGRA) 100 MG tablet Take 1/4-1/2 tab as needed.  . vitamin E 200 UNIT capsule Take 200 Units by mouth 2 (two) times daily.  . Zinc 50 MG TABS Take 50 mg by mouth daily.  No Known Allergies  SOCIAL HISTORY/FAMILY HISTORY   Reviewed in Epic:  Pertinent findings: N/A  OBJCTIVE -PE, EKG, labs   Wt Readings from Last 3 Encounters:  03/24/20 234 lb 6.4 oz (106.3 kg)  03/16/20 231 lb 9.6 oz (105.1 kg)  01/24/20 (!) 230 lb (104.3 kg)    Physical Exam: BP 128/74   Pulse 60   Ht 6\' 1"  (1.854 m)   Wt 234 lb 6.4 oz (106.3 kg)   BMI 30.93 kg/m  -> He says at home he weighs 226 pounds. Physical Exam Vitals reviewed.  Constitutional:      General: He is not in acute distress.    Appearance: Normal appearance. He is obese. He is not ill-appearing or toxic-appearing.     Comments: Well-groomed.  Healthy-appearing.  HENT:     Head: Normocephalic and atraumatic.  Neck:     Vascular: No carotid bruit, hepatojugular reflux or JVD.  Cardiovascular:     Rate and Rhythm: Normal rate and regular rhythm. Occasional extrasystoles are present.    Chest Wall: PMI is not displaced.     Pulses: Normal pulses.     Heart sounds: S1 normal. Heart sounds are distant. Murmur  heard. High-pitched harsh crescendo-decrescendo early systolic murmur is present with a grade of 1/6 at the upper right sternal border.  No friction rub. No gallop. No S4 sounds.      Comments: S2-split Musculoskeletal:     Cervical back: Normal range of motion.  Neurological:     Mental Status: He is alert.     Adult ECG Report  Rate: 60 ;  Rhythm: normal sinus rhythm and RBBB.  Otherwise normal axis, intervals durations.;   Narrative Interpretation: Stable EKG.  Recent Labs:  Not Fasting labs -- better in June - LDL was 76. Lab Results  Component Value Date   CHOL 229 (H) 03/16/2020   HDL 34 (L) 03/16/2020   LDLCALC 159 (H) 03/16/2020   TRIG 203 (H) 03/16/2020   CHOLHDL 6.7 (H) 03/16/2020   Lab Results  Component Value Date   CREATININE 0.99 03/16/2020   BUN 19 03/16/2020   NA 139 03/16/2020   K 4.1 03/16/2020   CL 103 03/16/2020   CO2 29 03/16/2020   Lab Results  Component Value Date   TSH 2.33 03/16/2020    ASSESSMENT/PLAN    Problem List Items Addressed This Visit    Hyperlipidemia associated with type 2 diabetes mellitus ([place]) - Primary (Chronic)    His lipids look much better on previous than they do now on this most recent level.  I am wondering even if these are truly fasting lipids.  We will continue as we are doing and then recheck lipids in December based on lab determine what we would do with his meds.  He continues to be on Crestor and says he is taking all along.  Would probably require additional therapy.      Relevant Orders   Hepatic function panel   Lipid panel   Resistant hypertension (Chronic)    Blood pressure looks good today.  Continue current dose of carvedilol and Benicar and HCTZ on amlodipine.      RBBB (right bundle branch block) (Chronic)    Chronic.  Explains split S2 on exam      Relevant Orders   EKG 12-Lead (Completed)   Atherosclerosis of aorta (HCC) (Chronic)    No signs of AAA.  He does have aortic atherosclerosis.   Continue with lipid and blood  pressure management.          COVID-19 Education: The signs and symptoms of COVID-19 were discussed with the patient and how to seek care for testing (follow up with PCP or arrange E-visit).   The importance of social distancing and COVID-19 vaccination was discussed today.   The patient is practicing social distancing & Masking.   I spent a total of 18 minutes with the patient spent in direct patient consultation.  Additional time spent with chart review  / charting (studies, outside notes, etc): 8 Total Time: 26 min   Current medicines are reviewed at length with the patient today.  (+/- concerns) n/a  Notice: This dictation was prepared with Dragon dictation along with smaller phrase technology. Any transcriptional errors that result from this process are unintentional and may not be corrected upon review.  Patient Instructions / Medication Changes & Studies & Tests Ordered   Patient Instructions  Medication Instructions:  No changes *If you need a refill on your cardiac medications before your next appointment, please call your pharmacy*   Lab Work: Lipid Hepatic - DEC 2021 ( towards the end of the month )  Fasting If you have labs (blood work) drawn today and your tests are completely normal, you will receive your results only by: Marland Kitchen MyChart Message (if you have MyChart) OR . A paper copy in the mail If you have any lab test that is abnormal or we need to change your treatment, we will call you to review the results.   Testing/Procedures: Not needed   Follow-Up: At [facility], you and your health needs are our priority.  As part of our continuing mission to provide you with exceptional heart care, we have created designated Provider Care Teams.  These Care Teams include your primary Cardiologist (physician) and Advanced Practice Providers (APPs -  Physician Assistants and Nurse Practitioners) who all work together to provide you with  the care you need, when you need it.     Your next appointment:   12 month(s)  The format for your next appointment:   In Person  Provider:   [name], MD       Studies Ordered:   Orders Placed This Encounter  Procedures  . Hepatic function panel  . Lipid panel  . EKG 12-Lead     [name], M.D., M.S. Interventional Cardiologist   Pager # [phone] Phone # [phone] [address] [place], [place] [address]   Thank you for choosing Heartcare at [facility]!!

## 2020-03-30 ENCOUNTER — Encounter: Payer: Self-pay | Admitting: Cardiology

## 2020-03-30 NOTE — Assessment & Plan Note (Signed)
His lipids look much better on previous than they do now on this most recent level.  I am wondering even if these are truly fasting lipids.  We will continue as we are doing and then recheck lipids in December based on lab determine what we would do with his meds.  He continues to be on Crestor and says he is taking all along.  Would probably require additional therapy.

## 2020-03-30 NOTE — Assessment & Plan Note (Signed)
No signs of AAA.  He does have aortic atherosclerosis.  Continue with lipid and blood pressure management.

## 2020-03-30 NOTE — Assessment & Plan Note (Signed)
Chronic.  Explains split S2 on exam

## 2020-03-30 NOTE — Assessment & Plan Note (Signed)
Blood pressure looks good today.  Continue current dose of carvedilol and Benicar and HCTZ on amlodipine.

## 2020-04-09 ENCOUNTER — Other Ambulatory Visit: Payer: Self-pay | Admitting: Adult Health

## 2020-05-19 ENCOUNTER — Encounter: Payer: 59 | Admitting: Adult Health

## 2020-05-31 ENCOUNTER — Other Ambulatory Visit: Payer: Self-pay | Admitting: Adult Health

## 2020-05-31 DIAGNOSIS — I10 Essential (primary) hypertension: Secondary | ICD-10-CM

## 2020-06-02 ENCOUNTER — Encounter: Payer: Self-pay | Admitting: Adult Health

## 2020-06-02 DIAGNOSIS — U071 COVID-19: Secondary | ICD-10-CM | POA: Insufficient documentation

## 2020-06-03 ENCOUNTER — Other Ambulatory Visit: Payer: Self-pay | Admitting: Oncology

## 2020-06-03 ENCOUNTER — Encounter: Payer: Self-pay | Admitting: Oncology

## 2020-06-03 ENCOUNTER — Ambulatory Visit (HOSPITAL_COMMUNITY)
Admission: RE | Admit: 2020-06-03 | Discharge: 2020-06-03 | Disposition: A | Discharge status: Home / Self Care | Payer: 59 | Source: Ambulatory Visit | Attending: Pulmonary Disease | Admitting: Pulmonary Disease

## 2020-06-03 ENCOUNTER — Telehealth: Payer: Self-pay | Admitting: Oncology

## 2020-06-03 DIAGNOSIS — U071 COVID-19: Secondary | ICD-10-CM | POA: Diagnosis not present

## 2020-06-03 MED ORDER — SODIUM CHLORIDE 0.9 % IV SOLN: INTRAVENOUS | Status: DC | PRN

## 2020-06-03 MED ORDER — DIPHENHYDRAMINE HCL 50 MG/ML IJ SOLN: 50.0000 mg | Freq: Once | INTRAMUSCULAR | Status: DC | PRN

## 2020-06-03 MED ORDER — METHYLPREDNISOLONE SODIUM SUCC 125 MG IJ SOLR: 125.0000 mg | Freq: Once | INTRAMUSCULAR | Status: DC | PRN

## 2020-06-03 MED ORDER — ALBUTEROL SULFATE HFA 108 (90 BASE) MCG/ACT IN AERS: 2.0000 | INHALATION_SPRAY | Freq: Once | RESPIRATORY_TRACT | Status: DC | PRN

## 2020-06-03 MED ORDER — EPINEPHRINE 0.3 MG/0.3ML IJ SOAJ: 0.3000 mg | Freq: Once | INTRAMUSCULAR | Status: DC | PRN

## 2020-06-03 MED ORDER — FAMOTIDINE IN NACL 20-0.9 MG/50ML-% IV SOLN: 20.0000 mg | Freq: Once | INTRAVENOUS | Status: DC | PRN

## 2020-06-03 MED ORDER — SOTROVIMAB 500 MG/8ML IV SOLN
500.0000 mg | Freq: Once | INTRAVENOUS | Status: AC
  Administered 2020-06-03: 500 mg via INTRAVENOUS

## 2020-06-03 NOTE — Discharge Instructions (Signed)
10 Things You Can Do to Manage Your COVID-19 Symptoms at Home If you have possible or confirmed COVID-19: 1. Stay home from work and school. And stay away from other public places. If you must go out, avoid using any kind of public transportation, ridesharing, or taxis. 2. Monitor your symptoms carefully. If your symptoms get worse, call your healthcare provider immediately. 3. Get rest and stay hydrated. 4. If you have a medical appointment, call the healthcare provider ahead of time and tell them that you have or may have COVID-19. 5. For medical emergencies, call 911 and notify the dispatch personnel that you have or may have COVID-19. 6. Cover your cough and sneezes with a tissue or use the inside of your elbow. 7. Wash your hands often with soap and water for at least 20 seconds or clean your hands with an alcohol-based hand sanitizer that contains at least 60% alcohol. 8. As much as possible, stay in a specific room and away from other people in your home. Also, you should use a separate bathroom, if available. If you need to be around other people in or outside of the home, wear a mask. 9. Avoid sharing personal items with other people in your household, like dishes, towels, and bedding. 10. Clean all surfaces that are touched often, like counters, tabletops, and doorknobs. Use household cleaning sprays or wipes according to the label instructions. cdc.gov/coronavirus 01/02/2019 This information is not intended to replace advice given to you by your health care provider. Make sure you discuss any questions you have with your health care provider. Document Revised: 06/06/2019 Document Reviewed: 06/06/2019 Elsevier Patient Education  2020 Elsevier Inc. What types of side effects do monoclonal antibody drugs cause?  Common side effects  In general, the more common side effects caused by monoclonal antibody drugs include: . Allergic reactions, such as hives or itching . Flu-like signs and  symptoms, including chills, fatigue, fever, and muscle aches and pains . Nausea, vomiting . Diarrhea . Skin rashes . Low blood pressure   The CDC is recommending patients who receive monoclonal antibody treatments wait at least 90 days before being vaccinated.  Currently, there are no data on the safety and efficacy of mRNA COVID-19 vaccines in persons who received monoclonal antibodies or convalescent plasma as part of COVID-19 treatment. Based on the estimated half-life of such therapies as well as evidence suggesting that reinfection is uncommon in the 90 days after initial infection, vaccination should be deferred for at least 90 days, as a precautionary measure until additional information becomes available, to avoid interference of the antibody treatment with vaccine-induced immune responses. If you have any questions or concerns after the infusion please call the Advanced Practice Provider on call at [phone]. This number is ONLY intended for your use regarding questions or concerns about the infusion post-treatment side-effects.  Please do not provide this number to others for use. For return to work notes please contact your primary care provider.   If someone you know is interested in receiving treatment please have them call the COVID hotline at [phone].   

## 2020-06-03 NOTE — Progress Notes (Signed)
Orders placed.   Faythe Casa, NP 06/03/2020 12:44 PM

## 2020-06-03 NOTE — Telephone Encounter (Signed)
I connected by phone with Mr. Gladwin to discuss the potential use of an new treatment for mild to moderate COVID-19 viral infection in non-hospitalized patients.   This patient is a age/sex that meets the FDA criteria for Emergency Use Authorization of casirivimab\imdevimab.  Has a (+) direct SARS-CoV-2 viral test result 1. Has mild or moderate COVID-19  2. Is ? 67 years of age and weighs ? 40 kg 3. Is NOT hospitalized due to COVID-19 4. Is NOT requiring oxygen therapy or requiring an increase in baseline oxygen flow rate due to COVID-19 5. Is within 10 days of symptom onset 6. Has at least one of the high risk factor(s) for progression to severe COVID-19 and/or hospitalization as defined in EUA. Specific high risk criteria : Past Medical History:  Diagnosis Date  . Arthritis   . DDD (degenerative disc disease), lumbar   . ED (erectile dysfunction)   . GERD (gastroesophageal reflux disease)   . Hiatal hernia   . History of colon polyps   . Hyperlipidemia   . Hypertension    No sign of renal artery stenosis  . IBS (irritable bowel syndrome)   . RBBB   . Systolic murmur    no  current problems  . Vertigo   . Vitamin D deficiency   ?   Symptom onset 07/01/20   I have spoken and communicated the following to the patient or parent/caregiver:   1. FDA has authorized the emergency use of casirivimab\imdevimab for the treatment of mild to moderate COVID-19 in adults and pediatric patients with positive results of direct SARS-CoV-2 viral testing who are 69 years of age and older weighing at least 40 kg, and who are at high risk for progressing to severe COVID-19 and/or hospitalization.   2. The significant known and potential risks and benefits of casirivimab\imdevimab, and the extent to which such potential risks and benefits are unknown.   3. Information on available alternative treatments and the risks and benefits of those alternatives, including clinical trials.   4. Patients  treated with casirivimab\imdevimab should continue to self-isolate and use infection control measures (e.g., wear mask, isolate, social distance, avoid sharing personal items, clean and disinfect "high touch" surfaces, and frequent handwashing) according to CDC guidelines.    5. The patient or parent/caregiver has the option to accept or refuse casirivimab\imdevimab .   After reviewing this information with the patient, The patient agreed to proceed with receiving casirivimab\imdevimab infusion and will be provided a copy of the Fact sheet prior to receiving the infusion.Rulon Abide, AGNP-C [phone] ([place])

## 2020-06-03 NOTE — Progress Notes (Signed)
Diagnosis: COVID-19  Physician: Dr. Patrick Wright  Procedure: Covid Infusion Clinic Med: Sotrovimab infusion - Provided patient with sotrovimab fact sheet for patients, parents, and caregivers prior to infusion.   Complications: No immediate complications noted  Discharge: Discharged home    

## 2020-06-03 NOTE — Progress Notes (Signed)
Patient reviewed Fact Sheet for Patients, Parents, and Caregivers for Emergency Use Authorization (EUA) of Sotrovimab for the Treatment of Coronavirus. Patient also reviewed and is agreeable to the estimated cost of treatment. Patient is agreeable to proceed.   

## 2020-06-15 NOTE — Progress Notes (Signed)
Complete Physical  Assessment and Plan:  Diagnoses and all orders for this visit:  Encounter for routine adult health examination with abnormal findings  Atherosclerosis of aorta Per imaging, CXR 07/2018 Control blood pressure, cholesterol, glucose, increase exercise.   Resistant hypertension Controlled on current medications Monitor blood pressure at home; call if consistently over 130/80 Continue DASH diet.   Reminder to go to the ER if any CP, SOB, nausea, dizziness, severe HA, changes vision/speech, left arm numbness and tingling and jaw pain.  RBBB (right bundle branch block) Monitor, followed by cardiology  Gastroesophageal reflux disease Well managed on current medications Discussed diet, avoiding triggers and other lifestyle changes  Hiatal hernia Small regular meals, PPI as recommended by GI  Esophageal stricture S/p dilation, follow up GI as recommended - 6 months  Duodenal polyp S/p resection; follow up GI as recommended  Colon polyps Recommended 3 year recall, due 2024  Vitamin D deficiency Continue supplementation Check vitamin D level  Systolic murmur Normal ECHO Followed by cardiology  T2DM [facility])  Education: Reviewed 'ABCs' of diabetes management (respective goals in parentheses):  A1C (<7), blood pressure (<130/80), and cholesterol (LDL <70) Eye Exam yearly and Dental Exam every 6 months.- reminded overdue diabetes eye exam  Dietary recommendations Physical Activity recommendations - GLP-1i for weight loss benefits - he will check with insurance about preferred agent, if none, consider bydurion Bcise - A1C - Diabetes foot exam   CKD II associated with T2DM ([place]) Increase fluids, avoid NSAIDS, monitor sugars, will monitor  Obesity- BMI 30 Long discussion about weight loss, diet, and exercise Recommended diet heavy in fruits and veggies and low in animal meats, cheeses, and dairy products, appropriate calorie intake Discussed appropriate weight  for height Off of phentermine - limited benefit - trying GLP1i pending insurance  Follow up at next visit  Medication management CBC, CMP/GFR, magnesium, UA  Hyperlipidemia associated with T2DM (HCC) Continue medications - has been well controlled in past with rosuvastatin  Continue low cholesterol diet and exercise.  Check lipid panel.   Erectile dysfunction associated with T2DM (HCC)  Control blood pressure, cholesterol, glucose, increase exercise.  Declines med at this time  Arthritis/decenerative disk Continue follow up with ortho, PRN gabapentin, NSAIDs working well    Orders Placed This Encounter  Procedures  . CBC with Differential/Platelet  . COMPLETE METABOLIC PANEL WITH GFR  . Magnesium  . Lipid panel  . TSH  . Hemoglobin A1c  . VITAMIN D 25 Hydroxy (Vit-D Deficiency, Fractures)  . PSA  . Microalbumin / creatinine urine ratio  . Urinalysis, Routine w reflex microscopic  . HM DIABETES FOOT EXAM     Discussed med's effects and SE's. Screening labs and tests as requested with regular follow-up as recommended. Over 45 minutes of exam, counseling, chart review, coordination of care and critical decision making was performed for establishment of new patient with complete physical.   Future Appointments  Date Time Provider [place]  06/16/2021 10:00 AM Liane Comber, NP [facility] None     HPI BP 108/68   Pulse 64   Temp (!) 97.3 F (36.3 C)   Ht 6\' 1"  (1.854 m)   Wt 233 lb 6.4 oz (105.9 kg)   SpO2 97%   BMI 30.79 kg/m   The patient is a very pleasant 69 y.o., appears young for age, married senior Education administrator, who moved back to this area in 2018 after working in [place] for 5 years. He has Resistant hypertension; Hyperlipidemia associated with type  2 diabetes mellitus ([place]); Degenerative lumbar disc; RBBB (right bundle branch block); Type 2 diabetes mellitus ([place]); Acid reflux; Vitamin D deficiency; Medication management; Erectile dysfunction  associated with type 2 diabetes mellitus ([place]); Systolic murmur; Obesity (BMI 30.0-34.9); Atherosclerosis of aorta ([place]); CKD stage 2 due to type 2 diabetes mellitus ([place]); Hiatal hernia; Esophageal stricture; History of adenomatous polyp of colon; Polyp of duodenum; and COVID-19 (sx onset 05/31/2020, + test 06/02/2020) on their problem list.   He is married, 1 grown son got married last year no grandkids. 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   He recently had covid Nov 2021, after Thanksgiving, did well following monoclonal antibody infusion. Reports doing well other than diminished sense of taste/smell.   He follows with [facility] Levy Pupa for multiple ortho complaints. Has had lumbar pain, degenerative disk with intermittent R radicular sx, has had injection with good results for 9 months, also benefits from PRN gabapentin 300 mg, advil/aleve.     He recently underwent L carpal tunnel release on 03/06/2020 by Dr. Amedeo Plenty and reports went well  He has a diagnosis of GERD, He had colonoscopy and EGD by Dr. 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 Rush Landmark which showed benign pathology, with 6 month follow up recommended. Colonoscopy showed several adenomatous polyps recommended for 3 year follow up. Continue omeprazole 40 mg daily, and reports has elevated HOB 2 inches with fully resolved GERD.   BMI is Body mass index is 30.79 kg/m., he has been working on diet and exercise. Stopped phentermine due to lack of sufficient perceived benefit.  Wt Readings from Last 3 Encounters:  06/16/20 233 lb 6.4 oz (105.9 kg)  03/24/20 234 lb 6.4 oz (106.3 kg)  03/16/20 231 lb 9.6 oz (105.1 kg)   He has atherosclerosis of aorta noted on imaging (CXR 07/2018) He exercises nearly every day (cardio/weights 3 days a week), watches his  diet, doing intermittent fasting some days by skipping breakfast, and would like to work towards getting off of some medications. Never smoker, drinks very rarely, 3 cups of coffee a day, 4-6x16.9 fl oz bottles of water daily. Performs regular self-testicular exams without concerns.   His blood pressure has been controlled at home, followed by Dr. Ellyn Hack annually, today their BP is BP: 108/68  He had essentially normal ECHO in 07/2017 obtained for murmur evaluation; attributed to mild septal thickening.   He does workout. He denies chest pain, shortness of breath, dizziness.   He is on cholesterol medication (rosuvastatin 40 mg daily) and denies myalgias. His cholesterol is not at goal. The cholesterol last visit was:   Lab Results  Component Value Date   CHOL 229 (H) 03/16/2020   HDL 34 (L) 03/16/2020   LDLCALC 159 (H) 03/16/2020   TRIG 203 (H) 03/16/2020   CHOLHDL 6.7 (H) 03/16/2020    He has been working on diet and exercise for T2DM managed by lifestyle (6.5% 04/2018, 6.6% 02/2019), he is on ASA, ACEi, statin, metformin 500 mg BID and denies foot ulcerations, increased appetite, nausea, paresthesia of the feet, polydipsia, polyuria and visual disturbances. He does have erectile dysfunction, but not recently active, has declined sildenafil. Last A1C in the office was:  Lab Results  Component Value Date   HGBA1C 6.4 (H) 03/16/2020    He has stable CKD II associated with T2DM  monitored via this office:  Lab Results  Component Value Date   GFRNONAA 78 03/16/2020   Patient is on Vitamin D supplement, ? 4000 IU daily Lab Results  Component Value Date   VD25OH 35 05/20/2019     Denies nocturia, slow stream, dripping, sense of incomplete emptying;  Lab Results  Component Value Date   PSA 1.6 05/20/2019   PSA 1.3 04/16/2018   PSA 1.3 04/13/2017      Current Medications:  Current Outpatient Medications on File Prior to Visit  Medication Sig Dispense Refill  . amLODipine (NORVASC)  10 MG tablet Take     1 tablet     Daily       for BP 90 tablet 0  . Ascorbic Acid (VITAMIN C) 1000 MG tablet Take 1,000 mg by mouth daily.    Marland Kitchen aspirin 81 MG tablet Take 81 mg by mouth daily.    . carvedilol (COREG) 12.5 MG tablet TAKE ONE TABLET BY MOUTH TWICE A DAY 120 tablet 1  . Cholecalciferol 50 MCG (2000 UT) CAPS Take 2,000 Units by mouth in the morning and at bedtime.     . Magnesium 400 MG TABS Take by mouth daily.    . metFORMIN (GLUCOPHAGE-XR) 500 MG 24 hr tablet Take 500 mg by mouth 2 (two) times daily.    Marland Kitchen olmesartan-hydrochlorothiazide (BENICAR HCT) 40-25 MG tablet Take    1 tablet    Daily     for BP 90 tablet 0  . omeprazole (PRILOSEC) 40 MG capsule TAKE ONE CAPSULE BY MOUTH TWICE A DAY BEFORE MEALS TO REDUCE STOMACH ACID 60 capsule 1  . Pyridoxine HCl (B-6 PO) Take by mouth daily.    . rosuvastatin (CRESTOR) 40 MG tablet Take 1 tablet Daily for Cholesterol 90 tablet 1  . vitamin E 200 UNIT capsule Take 200 Units by mouth 2 (two) times daily.    . Zinc 50 MG TABS Take 50 mg by mouth daily.     . Semaglutide,0.25 or 0.5MG /DOS, (OZEMPIC, 0.25 OR 0.5 MG/DOSE,) 2 MG/1.5ML SOPN Inject 0.25 mg subcutaneously once weekly for 4 weeks, then 0.5 mg weekly for 4 weeks. 4.5 mL 0   No current facility-administered medications on file prior to visit.   Allergies:  No Known Allergies Health Maintenance:  Immunization History  Administered Date(s) Administered  . Influenza Split 08/28/2007  . Pneumococcal Conjugate-13 04/16/2018  . Pneumococcal Polysaccharide-23 05/20/2019  . Tdap 11/19/2010    Tetanus: 2014 with laceration Pneumovax: 2020 Prevnar 13: 2019 Flu vaccine: Denies Shingrix: Denies Covid 19: declines, recent infection   PPD: negative 2011-2016  ECHO: 07/2017 normal   Colonoscopy: 01/2020 - Dr. Hilarie Fredrickson, 3 year recall  EGD: 11/2019  Eye Exam: Dr. Lucious Groves, [facility], 01/14/2019, normal - reminded to schedule,  Dentist: Dr. Arnell Sieving, [place], last visit  2021  Patient Care Team: Unk Pinto, MD as PCP - General (Internal Medicine) Leonie Man, MD as PCP - Cardiology (Cardiology)  Medical History:  has Resistant hypertension; Hyperlipidemia associated with type 2 diabetes mellitus ([place]); Degenerative lumbar disc; RBBB (right bundle branch block); Type 2 diabetes mellitus ([place]); Acid reflux; Vitamin D deficiency; Medication management; Erectile dysfunction associated with type 2 diabetes mellitus ([place]); Systolic murmur; Obesity (BMI 30.0-34.9); Atherosclerosis of aorta ([place]); CKD stage 2 due to type 2 diabetes mellitus ([place]); Hiatal hernia; Esophageal stricture; History of adenomatous polyp of colon; Polyp of duodenum; and COVID-19 (sx onset 05/31/2020, + test 06/02/2020) on their problem list. Surgical History:  He  has a past surgical history that includes Cardiac catheterization (2005); Abdominal surgery (1956); Stapedectomy (Bilateral); Trigger finger release (Right, 2014); Cyst removal hand (Left, 1975); Skin lesion excision; transthoracic echocardiogram (07/2017); Colonoscopy; Upper gi endoscopy; Wisdom tooth extraction; Endoscopic mucosal resection (N/A, 01/27/2020); biopsy (01/27/2020); Submucosal lifting injection (01/27/2020); Submucosal tattoo injection (01/27/2020); Hemostasis clip placement (01/27/2020); enteroscopy (N/A, 01/27/2020); and Carpal tunnel release (Left, 03/06/2020). Family History:  His family history includes Arrhythmia in his brother; COPD in his mother; Heart attack in his brother; Heart disease in his mother; Heart failure in his mother; Hyperlipidemia in his brother and brother; Hypertension in his brother and brother; Kidney disease in his father. Social History:   reports that he has never smoked. He has never used smokeless tobacco. He reports current alcohol use. He reports that he does not use drugs.   Review of Systems:  Review of Systems  Constitutional: Negative.  Negative for weight loss.  HENT: Negative  for congestion, hearing loss and sore throat.   Eyes: Negative for blurred vision.  Respiratory: Negative for cough, shortness of breath and wheezing.   Cardiovascular: Negative for chest pain and palpitations.  Gastrointestinal: Negative for abdominal pain, blood in stool, constipation, melena and nausea.  Genitourinary: Negative.   Musculoskeletal: Positive for back pain (intermittent lumbar) and joint pain (L knee, bil ankles and feet). Negative for myalgias.  Skin: Negative.   Neurological: Negative for dizziness, sensory change and headaches.  Endo/Heme/Allergies: Negative.  Negative for environmental allergies. Does not bruise/bleed easily.  Psychiatric/Behavioral: Negative.    Physical Exam: Estimated body mass index is 30.79 kg/m as calculated from the following:   Height as of this encounter: 6\' 1"  (1.854 m).   Weight as of this encounter: 233 lb 6.4 oz (105.9 kg). BP 108/68   Pulse 64   Temp (!) 97.3 F (36.3 C)   Ht 6\' 1"  (1.854 m)   Wt 233 lb 6.4 oz (105.9 kg)   SpO2 97%   BMI 30.79 kg/m    General Appearance: Well nourished, in no apparent distress.  Eyes: PERRLA, EOMs, conjunctiva no swelling or erythema, fundal exam deferred to ophth Sinuses: No Frontal/maxillary tenderness  ENT/Mouth: Ext aud canals clear, normal light reflex with TMs without erythema, bulging. Good dentition. No erythema, swelling, or exudate on post pharynx. Tonsils not swollen or erythematous. Hearing normal.  Neck: Supple, thyroid normal. No bruits  Respiratory: Respiratory effort normal, BS equal bilaterally without rales, rhonchi, wheezing or stridor.  Cardio: RR, bradycardic, without murmurs, rubs or gallops. Brisk peripheral pulses without edema.  Chest: symmetric, with normal excursions and percussion.  Abdomen: Soft, nontender, no guarding, rebound, hernias, masses, or organomegaly.  Lymphatics: Non tender without lymphadenopathy.  Genitourinary: Patient declines Musculoskeletal: Full  ROM all peripheral extremities,5/5 strength, and normal gait. Bony growth/deformity noted to superior aspect of bil MCP joint of 1st digit, ROM intact, without signs of acute inflammation.  Skin: Warm, dry without rashes, ecchymosis. Multiple nevi to back; benign appearing, all <6 mm  Neuro: Cranial nerves intact, reflexes equal bilaterally. Normal muscle tone, no cerebellar symptoms. Sensation intact.  Psych: Awake and oriented X 3, normal affect, Insight and Judgment appropriate.   EKG: had recently by cardiology Dr. Ellyn Hack - defer   Izora Ribas 9:43 AM [facility]

## 2020-06-16 ENCOUNTER — Other Ambulatory Visit: Payer: Self-pay

## 2020-06-16 ENCOUNTER — Encounter: Payer: Self-pay | Admitting: Adult Health

## 2020-06-16 ENCOUNTER — Ambulatory Visit: Payer: 59 | Admitting: Adult Health

## 2020-06-16 VITALS — BP 108/68 | HR 64 | Temp 97.3°F | Ht 73.0 in | Wt 233.4 lb

## 2020-06-16 DIAGNOSIS — M51369 Other intervertebral disc degeneration, lumbar region without mention of lumbar back pain or lower extremity pain: Secondary | ICD-10-CM

## 2020-06-16 DIAGNOSIS — K317 Polyp of stomach and duodenum: Secondary | ICD-10-CM

## 2020-06-16 DIAGNOSIS — E785 Hyperlipidemia, unspecified: Secondary | ICD-10-CM

## 2020-06-16 DIAGNOSIS — Z0000 Encounter for general adult medical examination without abnormal findings: Secondary | ICD-10-CM

## 2020-06-16 DIAGNOSIS — Z86010 Personal history of colonic polyps: Secondary | ICD-10-CM

## 2020-06-16 DIAGNOSIS — I4510 Unspecified right bundle-branch block: Secondary | ICD-10-CM

## 2020-06-16 DIAGNOSIS — K219 Gastro-esophageal reflux disease without esophagitis: Secondary | ICD-10-CM

## 2020-06-16 DIAGNOSIS — I700 Atherosclerosis of aorta: Secondary | ICD-10-CM

## 2020-06-16 DIAGNOSIS — E559 Vitamin D deficiency, unspecified: Secondary | ICD-10-CM

## 2020-06-16 DIAGNOSIS — Z125 Encounter for screening for malignant neoplasm of prostate: Secondary | ICD-10-CM

## 2020-06-16 DIAGNOSIS — Z860101 Personal history of adenomatous and serrated colon polyps: Secondary | ICD-10-CM

## 2020-06-16 DIAGNOSIS — Z79899 Other long term (current) drug therapy: Secondary | ICD-10-CM

## 2020-06-16 DIAGNOSIS — M5136 Other intervertebral disc degeneration, lumbar region: Secondary | ICD-10-CM

## 2020-06-16 DIAGNOSIS — E66811 Obesity, class 1: Secondary | ICD-10-CM

## 2020-06-16 DIAGNOSIS — K449 Diaphragmatic hernia without obstruction or gangrene: Secondary | ICD-10-CM

## 2020-06-16 DIAGNOSIS — Z1329 Encounter for screening for other suspected endocrine disorder: Secondary | ICD-10-CM

## 2020-06-16 DIAGNOSIS — E1169 Type 2 diabetes mellitus with other specified complication: Secondary | ICD-10-CM

## 2020-06-16 DIAGNOSIS — R011 Cardiac murmur, unspecified: Secondary | ICD-10-CM

## 2020-06-16 DIAGNOSIS — Z1389 Encounter for screening for other disorder: Secondary | ICD-10-CM

## 2020-06-16 DIAGNOSIS — E1122 Type 2 diabetes mellitus with diabetic chronic kidney disease: Secondary | ICD-10-CM

## 2020-06-16 DIAGNOSIS — I10 Essential (primary) hypertension: Secondary | ICD-10-CM

## 2020-06-16 DIAGNOSIS — N182 Chronic kidney disease, stage 2 (mild): Secondary | ICD-10-CM

## 2020-06-16 DIAGNOSIS — K222 Esophageal obstruction: Secondary | ICD-10-CM

## 2020-06-16 DIAGNOSIS — I1A0 Resistant hypertension: Secondary | ICD-10-CM

## 2020-06-16 DIAGNOSIS — E669 Obesity, unspecified: Secondary | ICD-10-CM

## 2020-06-16 NOTE — Patient Instructions (Addendum)
Mr. Granholm , Thank you for taking time to come for your Annual Wellness Visit. I appreciate your ongoing commitment to your health goals. Please review the following plan we discussed and let me know if I can assist you in the future.   These are the goals we discussed: Goals    . Blood Pressure < 130/80    . DIET - INCREASE WATER INTAKE     Recommend 65-80 fluid ounces daily     . Exercise 150 min/wk Moderate Activity    . LDL CALC < 100    . Weight (lb) < 220 lb (99.8 kg)       This is a list of the screening recommended for you and due dates:  Health Maintenance  Topic Date Due  . Eye exam for diabetics  01/15/2020  . COVID-19 Vaccine (1) 12/13/2020*  . Hemoglobin A1C  09/13/2020  . Complete foot exam   06/16/2021  . Tetanus Vaccine  07/04/2022  . Colon Cancer Screening  11/22/2022  .  Hepatitis C: One time screening is recommended by Center for Disease Control  (CDC) for  adults born from [birth year] through 1963.   Completed  . Pneumonia vaccines  Completed  . Flu Shot  Discontinued  *Topic was postponed. The date shown is not the original due date.     Shingrix - check with insurance - can get at [facility]   Please ask insurance about alternative/preferred agent instead of ozempic  - OR if they could cover trulicity, bydurion  OR if they would cover wegovy     Zoster Vaccine, Recombinant injection What is this medicine? ZOSTER VACCINE (ZOS ter vak SEEN) is used to prevent shingles in adults 67 years old and over. This vaccine is not used to treat shingles or nerve pain from shingles. This medicine may be used for other purposes; ask your health care provider or pharmacist if you have questions. COMMON BRAND NAME(S): [facility] What should I tell my health care provider before I take this medicine? They need to know if you have any of these conditions:  blood disorders or disease  cancer like leukemia or lymphoma  immune system problems or therapy  an unusual or  allergic reaction to vaccines, other medications, foods, dyes, or preservatives  pregnant or trying to get pregnant  breast-feeding How should I use this medicine? This vaccine is for injection in a muscle. It is given by a health care professional. Talk to your pediatrician regarding the use of this medicine in children. This medicine is not approved for use in children. Overdosage: If you think you have taken too much of this medicine contact a poison control center or emergency room at once. NOTE: This medicine is only for you. Do not share this medicine with others. What if I miss a dose? Keep appointments for follow-up (booster) doses as directed. It is important not to miss your dose. Call your doctor or health care professional if you are unable to keep an appointment. What may interact with this medicine?  medicines that suppress your immune system  medicines to treat cancer  steroid medicines like prednisone or cortisone This list may not describe all possible interactions. Give your health care provider a list of all the medicines, herbs, non-prescription drugs, or dietary supplements you use. Also tell them if you smoke, drink alcohol, or use illegal drugs. Some items may interact with your medicine. What should I watch for while using this medicine? Visit your doctor for regular  check ups. This vaccine, like all vaccines, may not fully protect everyone. What side effects may I notice from receiving this medicine? Side effects that you should report to your doctor or health care professional as soon as possible:  allergic reactions like skin rash, itching or hives, swelling of the face, lips, or tongue  breathing problems Side effects that usually do not require medical attention (report these to your doctor or health care professional if they continue or are bothersome):  chills  headache  fever  nausea, vomiting  redness, warmth, pain, swelling or itching at site  where injected  tiredness This list may not describe all possible side effects. Call your doctor for medical advice about side effects. You may report side effects to FDA at 1-800-FDA-1088. Where should I keep my medicine? This vaccine is only given in a clinic, pharmacy, doctor's office, or other health care setting and will not be stored at home. NOTE: This sheet is a summary. It may not cover all possible information. If you have questions about this medicine, talk to your doctor, pharmacist, or health care provider.  2020 Elsevier/Gold Standard (2017-01-30 13:20:30)

## 2020-06-17 LAB — CBC WITH DIFFERENTIAL/PLATELET
Absolute Monocytes: 761 cells/uL (ref 200–950)
Basophils Absolute: 72 cells/uL (ref 0–200)
Basophils Relative: 1.1 %
Eosinophils Absolute: 351 cells/uL (ref 15–500)
Eosinophils Relative: 5.4 %
HCT: 41.5 % (ref 38.5–50.0)
Hemoglobin: 14 g/dL (ref 13.2–17.1)
Lymphs Abs: 1554 cells/uL (ref 850–3900)
MCH: 29.7 pg (ref 27.0–33.0)
MCHC: 33.7 g/dL (ref 32.0–36.0)
MCV: 87.9 fL (ref 80.0–100.0)
MPV: 8.9 fL (ref 7.5–12.5)
Monocytes Relative: 11.7 %
Neutro Abs: 3764 cells/uL (ref 1500–7800)
Neutrophils Relative %: 57.9 %
Platelets: 385 10*3/uL (ref 140–400)
RBC: 4.72 10*6/uL (ref 4.20–5.80)
RDW: 12.8 % (ref 11.0–15.0)
Total Lymphocyte: 23.9 %
WBC: 6.5 10*3/uL (ref 3.8–10.8)

## 2020-06-17 LAB — URINALYSIS, ROUTINE W REFLEX MICROSCOPIC
Bilirubin Urine: NEGATIVE
Glucose, UA: NEGATIVE
Hgb urine dipstick: NEGATIVE
Ketones, ur: NEGATIVE
Leukocytes,Ua: NEGATIVE
Nitrite: NEGATIVE
Protein, ur: NEGATIVE
Specific Gravity, Urine: 1.017 (ref 1.001–1.03)
pH: 6 (ref 5.0–8.0)

## 2020-06-17 LAB — COMPLETE METABOLIC PANEL WITH GFR
AG Ratio: 2 (calc) (ref 1.0–2.5)
ALT: 33 U/L (ref 9–46)
AST: 26 U/L (ref 10–35)
Albumin: 4.5 g/dL (ref 3.6–5.1)
Alkaline phosphatase (APISO): 39 U/L (ref 35–144)
BUN: 20 mg/dL (ref 7–25)
CO2: 32 mmol/L (ref 20–32)
Calcium: 10.5 mg/dL — ABNORMAL HIGH (ref 8.6–10.3)
Chloride: 98 mmol/L (ref 98–110)
Creat: 0.95 mg/dL (ref 0.70–1.25)
GFR, Est African American: 96 mL/min/{1.73_m2} (ref >=60)
GFR, Est Non African American: 82 mL/min/{1.73_m2} (ref >=60)
Globulin: 2.3 g/dL (calc) (ref 1.9–3.7)
Glucose, Bld: 106 mg/dL — ABNORMAL HIGH (ref 65–99)
Potassium: 4.5 mmol/L (ref 3.5–5.3)
Sodium: 137 mmol/L (ref 135–146)
Total Bilirubin: 0.5 mg/dL (ref 0.2–1.2)
Total Protein: 6.8 g/dL (ref 6.1–8.1)

## 2020-06-17 LAB — TSH: TSH: 2.04 mIU/L (ref 0.40–4.50)

## 2020-06-17 LAB — MICROALBUMIN / CREATININE URINE RATIO
Creatinine, Urine: 125 mg/dL (ref 20–320)
Microalb Creat Ratio: 4 mcg/mg creat (ref <30)
Microalb, Ur: 0.5 mg/dL

## 2020-06-17 LAB — PSA: PSA: 1.19 ng/mL (ref <=4.0)

## 2020-06-17 LAB — VITAMIN D 25 HYDROXY (VIT D DEFICIENCY, FRACTURES): Vit D, 25-Hydroxy: 64 ng/mL (ref 30–100)

## 2020-06-17 LAB — LIPID PANEL
Cholesterol: 154 mg/dL (ref <200)
HDL: 34 mg/dL — ABNORMAL LOW (ref >=40)
LDL Cholesterol (Calc): 88 mg/dL (calc)
Non-HDL Cholesterol (Calc): 120 mg/dL (calc) (ref <130)
Total CHOL/HDL Ratio: 4.5 (calc) (ref <5.0)
Triglycerides: 218 mg/dL — ABNORMAL HIGH (ref <150)

## 2020-06-17 LAB — MAGNESIUM: Magnesium: 1.9 mg/dL (ref 1.5–2.5)

## 2020-06-17 LAB — HEMOGLOBIN A1C
Hgb A1c MFr Bld: 6.8 % of total Hgb — ABNORMAL HIGH (ref <5.7)
Mean Plasma Glucose: 148 mg/dL
eAG (mmol/L): 8.2 mmol/L

## 2020-06-20 ENCOUNTER — Other Ambulatory Visit: Payer: Self-pay | Admitting: Adult Health

## 2020-06-20 ENCOUNTER — Other Ambulatory Visit: Payer: Self-pay | Admitting: Cardiology

## 2020-06-25 ENCOUNTER — Other Ambulatory Visit: Payer: Self-pay | Admitting: Internal Medicine

## 2020-06-25 MED ORDER — OLMESARTAN MEDOXOMIL-HCTZ 40-25 MG PO TABS
ORAL_TABLET | ORAL | 0 refills | Status: DC
Start: 2020-06-25 — End: 2020-09-18

## 2020-07-06 NOTE — Telephone Encounter (Signed)
Sent e mail to patient  Per Dr Herbie Baltimore,    A1c is ~6.4 with CBG in 110-120 range -- this is borderline if not early stage Diabetes.   Bryan Lemma, MD    Should discuss with primary the next step.

## 2020-07-28 LAB — HM DIABETES EYE EXAM

## 2020-08-17 ENCOUNTER — Other Ambulatory Visit: Payer: Self-pay | Admitting: Adult Health

## 2020-08-17 MED ORDER — TADALAFIL 20 MG PO TABS
10.0000 mg | ORAL_TABLET | Freq: Every day | ORAL | 0 refills | Status: DC | PRN
Start: 2020-08-17 — End: 2020-11-23

## 2020-08-29 ENCOUNTER — Other Ambulatory Visit: Payer: Self-pay | Admitting: Internal Medicine

## 2020-08-29 DIAGNOSIS — I10 Essential (primary) hypertension: Secondary | ICD-10-CM

## 2020-08-29 MED ORDER — AMLODIPINE BESYLATE 10 MG PO TABS: ORAL_TABLET | ORAL | 0 refills | Status: DC

## 2020-09-01 ENCOUNTER — Telehealth: Payer: Self-pay

## 2020-09-01 NOTE — Telephone Encounter (Signed)
Dr Rush Landmark what does this pt need to be scheduled for? [facility]US EMR??

## 2020-09-01 NOTE — Telephone Encounter (Signed)
FW: Followup from last year Received: 4 days ago Geoffry Paradise, Molinda Bailiff, RN  Timothy Lasso, RN      Previous Messages   ----- Message -----  From: Irving Copas., MD  Sent: 08/27/2020  4:48 PM EST  To: Algernon Huxley, RN, Jerene Bears, MD  Subject: RE: Followup from last year            Agree with JMP,  As long as no signficant recurrence is noted that requires follow up we would get him set up for follow up with you thereafter.  Thanks to all.  GM  ----- Message -----  From: Jerene Bears, MD  Sent: 08/27/2020  2:46 PM EST  To: Algernon Huxley, RN, Irving Copas., MD  Subject: RE: Followup from last year            GM x 1 given EMR and then I can survey thereafter  GM, okay with that?  Thanks  JMP   ----- Message -----  From: Algernon Huxley, RN  Sent: 08/27/2020  2:22 PM EST  To: Jerene Bears, MD  Subject: FW: Followup from last year            So this gets scheduled with you Dr. Hilarie Fredrickson or Dr. Jannifer Rodney?  ----- Message -----  From: Rush Landmark Telford Nab., MD  Sent: 08/27/2020  1:07 PM EST  To: Algernon Huxley, RN, Timothy Lasso, RN, *  Subject: RE: Followup from last year            I received a large pile of recalls earlier this week and completed them. I cannot remember if this particular patient was on there but Rovonda may have a better sense of things. Certainly reasonable for repeat enteroscopy to reevaluate the site of prior resection. Happy to get on the books as able.  Thanks.  GM  ----- Message -----  From: Algernon Huxley, RN  Sent: 08/27/2020 10:38 AM EST  To: Irving Copas., MD  Subject: FW: Followup from last year            Dr. Rush Landmark have you seen the recall in epic on this pt? He is calling regarding scheduling this procedure. Please advise.   ----- Message -----  From: [name]  Sent: 08/27/2020  9:34 AM EST  To: [facility]  Subject: Followup from last year               I still haven't heard from you on this procedure. Are we still doing this?

## 2020-09-02 ENCOUNTER — Other Ambulatory Visit: Payer: Self-pay

## 2020-09-02 DIAGNOSIS — D132 Benign neoplasm of duodenum: Secondary | ICD-10-CM

## 2020-09-02 NOTE — Telephone Encounter (Signed)
Nathaniel Gill, patient needs an EGD/EMR 60-minute slot for follow-up of previous duodenal adenoma resection.  If we can clear the risk of recurrence then he can follow-up with Dr. Hilarie Fredrickson thereafter but I need to do the next 1.  Thank you. GM

## 2020-09-02 NOTE — Telephone Encounter (Signed)
EGD EMR scheduled for 10/08/20 at 10 am at [facility] with Dr Rush Landmark.  COVID test on 10/05/20 at 1010 am  Left message on machine to call back

## 2020-09-03 NOTE — Telephone Encounter (Signed)
EGD EMR  scheduled, pt instructed and medications reviewed.  Patient instructions mailed to home.  Patient to call with any questions or concerns.  

## 2020-09-18 ENCOUNTER — Other Ambulatory Visit: Payer: Self-pay | Admitting: *Deleted

## 2020-09-18 ENCOUNTER — Other Ambulatory Visit: Payer: Self-pay | Admitting: Internal Medicine

## 2020-09-18 MED ORDER — OMEPRAZOLE 40 MG PO CPDR
DELAYED_RELEASE_CAPSULE | ORAL | 0 refills | Status: DC
Start: 2020-09-18 — End: 2020-12-14

## 2020-10-02 ENCOUNTER — Ambulatory Visit: Payer: 59 | Admitting: Adult Health

## 2020-10-05 ENCOUNTER — Other Ambulatory Visit (HOSPITAL_COMMUNITY): Payer: 59

## 2020-10-08 ENCOUNTER — Encounter (HOSPITAL_COMMUNITY): Admission: RE | Payer: Self-pay | Source: Home / Self Care

## 2020-10-08 ENCOUNTER — Ambulatory Visit (HOSPITAL_COMMUNITY): Admission: RE | Admit: 2020-10-08 | Payer: 59 | Source: Home / Self Care | Admitting: Gastroenterology

## 2020-10-08 SURGERY — ESOPHAGOGASTRODUODENOSCOPY (EGD) WITH PROPOFOL
Anesthesia: Monitor Anesthesia Care

## 2020-11-20 NOTE — Progress Notes (Signed)
WELCOME TO MEDICARE AND 3 MONTH FOLLOW UP  Assessment and Plan:  Diagnoses and all orders for this visit:  Annual Medicare Wellness Visit Due annually  - Screening AAA [place] today with initial visit   Atherosclerosis of aorta Per imaging, CXR 07/2018 Control blood pressure, cholesterol, glucose, increase exercise.   Resistant hypertension Controlled on current medications Monitor blood pressure at home; call if consistently over 130/80 Continue DASH diet.   Reminder to go to the ER if any CP, SOB, nausea, dizziness, severe HA, changes vision/speech, left arm numbness and tingling and jaw pain.  RBBB (right bundle branch block) Monitor, followed by cardiology  Systolic murmur Normal ECHO Followed by cardiology  T2DM [facility])  Education: Reviewed 'ABCs' of diabetes management (respective goals in parentheses):  A1C (<7), blood pressure (<130/80), and cholesterol (LDL <70) Eye Exam yearly and Dental Exam every 6 months.- diabetes eye exam report requested Dietary recommendations Physical Activity recommendations - A1C  Hyperlipidemia associated with T2DM ([place]) Continue medications - has been well controlled in past with rosuvastatin  LDL goal <70; reviewed low saturated fat, increase soluble fiber  Continue weight management and exercise.  - Lipid panel  Erectile dysfunction associated with T2DM (H[facility])  Control blood pressure, cholesterol, glucose, increase exercise.  Declines med at this time  CKD II associated with T2DM ([facility]) Increase fluids, avoid NSAIDS, monitor sugars, will monitor - CMP/GFR  Obesity- BMI 30 Long discussion about weight loss, diet, and exercise Recommended diet heavy in fruits and veggies and low in animal meats, cheeses, and dairy products, appropriate calorie intake Discussed appropriate weight for height Off of phentermine - limited benefit  Follow up at next visit  Medication management CBC, CMP/GFR, magnesium  Gastroesophageal reflux  disease Well managed on current medications Discussed diet, avoiding triggers and other lifestyle changes  Hiatal hernia Small regular meals, PPI as recommended by GI  S/p dilation esophageal stricture S/p dilation, continue PPI Denies recurrent sx; he prefers to defer repeat unless symptomatic  Duodenal polyp S/p resection; GI recommended 6 month follow up, overdue, advised to schedule and phone number given for Dr. Rush Landmark   Colon polyps Recommended 3 year recall, due 2024  Vitamin D deficiency Continue supplementation Defer vitamin D level  Arthritis/decenerative disk Continue follow up with ortho, PRN gabapentin, NSAIDs working well    Orders Placed This Encounter  Procedures  . CBC with Differential/Platelet  . COMPLETE METABOLIC PANEL WITH GFR  . Magnesium  . Lipid panel  . TSH  . Hemoglobin A1c  . [place], RETROPERITNL ABD,  LTD     Discussed med's effects and SE's. Screening labs and tests as requested with regular follow-up as recommended. Over 45 minutes of exam, counseling, chart review, coordination of care and critical decision making was performed for establishment of new patient with complete physical.   Future Appointments  Date Time Provider [place]  01/14/2021  9:30 AM Liane Comber, NP [facility] None  06/16/2021 10:00 AM Liane Comber, NP [facility] None  11/23/2021  3:30 PM Liane Comber, NP [facility] None    Plan:   During the course of the visit the patient was educated and counseled about appropriate screening and preventive services including:    Pneumococcal vaccine   Prevnar 13  Influenza vaccine  Td vaccine  Screening electrocardiogram  Bone densitometry screening  Colorectal cancer screening  Diabetes screening  Glaucoma screening  Nutrition counseling   Advanced directives: requested  HPI BP 122/66   Pulse 69   Temp (!) 96.4 F (  35.8 C)   Ht 6\' 1"  (1.854 m)   Wt 233 lb (105.7 kg)   SpO2 97%    BMI 30.74 kg/m   The patient is a very pleasant 70 y.o., presents for AWV and 3 month follow up on htn, hyperlipidemia, T2DM with CKD and ED, obesity, vitamin D def.   First grandchild due in November. Recently took early retirement but looking for new job.   He follows with [facility] Levy Pupa for multiple ortho complaints. Has had lumbar pain, degenerative disk with intermittent R radicular sx, has had injection with good results for 68 months, also benefits from PRN gabapentin 300 mg, advil/aleve.     He has a diagnosis of GERD, He had colonoscopy and EGD by Dr. Hilarie Fredrickson on 68/21/2021 which showed 3 cm hital hernia, low-grade of narrowing schatzki ring (dilated), adenomaous duodenal polyp underwent resection on 01/27/2020 by Dr. Rush Landmark, with 6 month follow up recommended but was postponed due to insurance. Colonoscopy showed several adenomatous polyps recommended for 3 year follow up. Continue omeprazole 40 mg daily, and reports has elevated HOB 2 inches with fully resolved GERD.   BMI is Body mass index is 30.74 kg/m., he has been working on diet and exercise, has treadmill (1-2 miles daily) and weight machines, also joined silver snears. Stopped phentermine due to lack of sufficient perceived benefit. Fat % at home around 18%, notes losing inches at waist Wt Readings from Last 3 Encounters:  11/23/20 233 lb (105.7 kg)  06/16/20 233 lb 6.4 oz (105.9 kg)  03/24/20 234 lb 6.4 oz (106.3 kg)   He has atherosclerosis of aorta noted on imaging (CXR 07/2018)  His blood pressure has been controlled at home, followed by Dr. Ellyn Hack annually, today their BP is BP: 122/66  He had essentially normal ECHO in 07/2017 obtained for murmur evaluation; attributed to mild septal thickening.   He does workout. He denies chest pain, shortness of breath, dizziness.   He is on cholesterol medication (rosuvastatin 40 mg daily) and denies myalgias. His cholesterol is not at goal. The cholesterol last visit  was:   Lab Results  Component Value Date   CHOL 154 06/16/2020   HDL 34 (L) 06/16/2020   LDLCALC 88 06/16/2020   TRIG 218 (H) 06/16/2020   CHOLHDL 4.5 06/16/2020    He has been working on diet and exercise for T2DM managed by lifestyle (6.5% 04/2018, 6.6% 02/2019), he is on ASA, ACEi, statin, metformin 500 mg BID and denies foot ulcerations, increased appetite, nausea, paresthesia of the feet, polydipsia, polyuria and visual disturbances. He does have erectile dysfunction, but not recently active, has declined sildenafil. Doesn't have glucometer, has always been well controlled. Last A1C in the office was:  Lab Results  Component Value Date   HGBA1C 6.8 (H) 06/16/2020    He has stable CKD II associated with T2DM monitored via this office:  Lab Results  Component Value Date   GFRNONAA 82 06/16/2020   Patient is on Vitamin D supplement, ? 4000 IU daily Lab Results  Component Value Date   VD25OH 64 06/16/2020       Current Medications:  Current Outpatient Medications on File Prior to Visit  Medication Sig Dispense Refill  . amLODipine (NORVASC) 10 MG tablet Take  1 tablet  Daily  for BP (Patient taking differently: Take 10 mg by mouth daily. Take  1 tablet  Daily  for BP) 90 tablet 0  . Ascorbic Acid (VITAMIN C) 1000 MG tablet Take  1,000 mg by mouth daily.    Marland Kitchen aspirin 81 MG tablet Take 81 mg by mouth daily.    Marland Kitchen BIOTIN PO Take by mouth daily.    . carvedilol (COREG) 12.5 MG tablet TAKE ONE TABLET BY MOUTH TWICE A DAY (Patient taking differently: Take 12.5 mg by mouth 2 (two) times daily with a meal.) 180 tablet 3  . Cholecalciferol 50 MCG (2000 UT) CAPS Take 2,000 Units by mouth in the morning and at bedtime.     . Magnesium 400 MG TABS Take 400 mg by mouth daily.    . metFORMIN (GLUCOPHAGE-XR) 500 MG 24 hr tablet Take 500 mg by mouth 2 (two) times daily.    Marland Kitchen olmesartan-hydrochlorothiazide (BENICAR HCT) 40-25 MG tablet TAKE ONE TABLET BY MOUTH DAILY FOR BLOOD PRESSURE (Patient  taking differently: Take 1 tablet by mouth daily. TAKE ONE TABLET BY MOUTH DAILY FOR BLOOD PRESSURE) 90 tablet 3  . omeprazole (PRILOSEC) 40 MG capsule Take     1 capsule     Daily         to Prevent Heartburn & Indigestion (Patient taking differently: Take 40 mg by mouth daily. Take     1 capsule     Daily         to Prevent Heartburn & Indigestion) 90 capsule 0  . Pyridoxine HCl (B-6) 100 MG TABS Take 100 mg by mouth daily.    . rosuvastatin (CRESTOR) 40 MG tablet Take 1 tablet Daily for Cholesterol (Patient taking differently: Take 40 mg by mouth daily. Take 1 tablet Daily for Cholesterol) 90 tablet 1  . vitamin E 200 UNIT capsule Take 200 Units by mouth 2 (two) times daily.    . Zinc 50 MG TABS Take 50 mg by mouth daily.     . tadalafil (CIALIS) 20 MG tablet Take 0.5-1 tablets (10-20 mg total) by mouth daily as needed for erectile dysfunction. (Patient not taking: Reported on 09/28/2020) 10 tablet 0   No current facility-administered medications on file prior to visit.   Allergies:  No Known Allergies Health Maintenance:  Immunization History  Administered Date(s) Administered  . Influenza Split 08/28/2007  . Pneumococcal Conjugate-13 04/16/2018  . Pneumococcal Polysaccharide-23 05/20/2019  . Tdap 11/19/2010    Tetanus: 2014 with laceration Pneumovax: 2020 Prevnar 13: 2019 Flu vaccine: Denies Shingrix: Denies Covid 19: declines  ECHO: 07/2017 normal   Colonoscopy: 01/2020 - Dr. Hilarie Fredrickson, 3 year recall  EGD: 11/2019 - adenomatous duodenal polyp and dilation, 6 month recall   Eye Exam: Dr. Omar Person, 2021 - report requested Dentist: Dr. Arnell Sieving, [place], last visit 2022  Patient Care Team: Unk Pinto, MD as PCP - General (Internal Medicine) Leonie Man, MD as PCP - Cardiology (Cardiology)  Medical History:  has Resistant hypertension; Hyperlipidemia associated with type 2 diabetes mellitus ([place]); Degenerative lumbar disc; RBBB (right bundle branch block); Type  2 diabetes mellitus ([place]); Acid reflux; Vitamin D deficiency; Medication management; Erectile dysfunction associated with type 2 diabetes mellitus ([place]); Systolic murmur; Obesity (BMI 30.0-34.9); Atherosclerosis of aorta ([place]); CKD stage 2 due to type 2 diabetes mellitus ([place]); Hiatal hernia; Esophageal stricture; History of adenomatous polyp of colon; and Polyp of duodenum on their problem list. Surgical History:  He  has a past surgical history that includes Cardiac catheterization (2005); Abdominal surgery (1956); Stapedectomy (Bilateral); Trigger finger release (Right, 2014); Cyst removal hand (Left, 1975); Skin lesion excision; transthoracic echocardiogram (07/2017); Colonoscopy; Upper gi endoscopy; Wisdom tooth extraction; Endoscopic mucosal  resection (N/A, 01/27/2020); biopsy (01/27/2020); Submucosal lifting injection (01/27/2020); Submucosal tattoo injection (01/27/2020); Hemostasis clip placement (01/27/2020); enteroscopy (N/A, 01/27/2020); and Carpal tunnel release (Left, 03/06/2020). Family History:  His family history includes Arrhythmia in his brother; COPD in his mother; Heart attack in his brother; Heart disease in his mother; Heart failure in his mother; Hyperlipidemia in his brother and brother; Hypertension in his brother and brother; Kidney disease in his father. Social History:   reports that he has never smoked. He has never used smokeless tobacco. He reports current alcohol use. He reports that he does not use drugs.   MEDICARE WELLNESS OBJECTIVES: Physical activity:   Cardiac risk factors:   Depression/mood screen:   Depression screen [facility] 2/9 06/16/2020  Decreased Interest 0  Down, Depressed, Hopeless 0  PHQ - 2 Score 0    ADLs:  No flowsheet data found.   Cognitive Testing  Alert? Yes  Normal Appearance?Yes  Oriented to person? Yes  Place? Yes   Time? Yes  Recall of three objects?  Yes  Can perform simple calculations? Yes  Displays appropriate judgment?Yes  Can read the  correct time from a watch face?Yes  EOL planning: Does Patient Have a Medical Advance Directive?: Yes Type of Advance Directive: [place] will Does patient want to make changes to medical advance directive?: No - Patient declined Copy of [place] in Chart?: No - copy requested      Review of Systems:  Review of Systems  Constitutional: Negative.  Negative for weight loss.  HENT: Negative for congestion, hearing loss and sore throat.   Eyes: Negative for blurred vision.  Respiratory: Negative for cough, shortness of breath and wheezing.   Cardiovascular: Negative for chest pain and palpitations.  Gastrointestinal: Negative for abdominal pain, blood in stool, constipation, melena and nausea.  Genitourinary: Negative.   Musculoskeletal: Positive for back pain (intermittent lumbar) and joint pain (L knee, bil ankles and feet). Negative for myalgias.  Skin: Negative.   Neurological: Negative for dizziness, sensory change and headaches.  Endo/Heme/Allergies: Negative.  Negative for environmental allergies. Does not bruise/bleed easily.  Psychiatric/Behavioral: Negative.    Physical Exam: Estimated body mass index is 30.74 kg/m as calculated from the following:   Height as of this encounter: 6\' 1"  (1.854 m).   Weight as of this encounter: 233 lb (105.7 kg). BP 122/66   Pulse 69   Temp (!) 96.4 F (35.8 C)   Ht 6\' 1"  (1.854 m)   Wt 233 lb (105.7 kg)   SpO2 97%   BMI 30.74 kg/m    General Appearance: Well nourished, in no apparent distress.  Eyes: PERRLA, EOMs, conjunctiva no swelling or erythema, fundal exam deferred to ophth Sinuses: No Frontal/maxillary tenderness  ENT/Mouth: Ext aud canals clear, normal light reflex with TMs without erythema, bulging. Good dentition. No erythema, swelling, or exudate on post pharynx. Tonsils not swollen or erythematous. Hearing normal.  Neck: Supple, thyroid normal. No bruits  Respiratory:  Respiratory effort normal, BS equal bilaterally without rales, rhonchi, wheezing or stridor.  Cardio: RR, bradycardic, without murmurs, rubs or gallops. Brisk peripheral pulses without edema.  Chest: symmetric, with normal excursions and percussion.  Abdomen: Soft, nontender, no guarding, rebound, hernias, masses, or organomegaly.  Lymphatics: Non tender without lymphadenopathy.  Genitourinary: Defer Musculoskeletal: Full ROM all peripheral extremities,5/5 strength, and normal gait. Bony growth/deformity noted to superior aspect of bil MCP joint of 1st digit, ROM intact, without signs of acute inflammation.  Skin: Warm, dry  without rashes, ecchymosis. Multiple nevi to back; benign appearing, all <6 mm  Neuro: Cranial nerves intact, reflexes equal bilaterally. Normal muscle tone, no cerebellar symptoms. Sensation intact.  Psych: Awake and oriented X 3, normal affect, Insight and Judgment appropriate.   EKG: has routinely by Dr. Ellyn Hack - defer  AAA [place]: <3 cm   Medicare Attestation I have personally reviewed: The patient's medical and social history Their use of alcohol, tobacco or illicit drugs Their current medications and supplements The patient's functional ability including ADLs,fall risks, home safety risks, cognitive, and hearing and visual impairment Diet and physical activities Evidence for depression or mood disorders  The patient's weight, height, BMI, and visual acuity have been recorded in the chart.  I have made referrals, counseling, and provided education to the patient based on review of the above and I have provided the patient with a written personalized care plan for preventive services.     Gorden Harms Jayton Popelka 4:02 PM [facility]

## 2020-11-23 ENCOUNTER — Ambulatory Visit (INDEPENDENT_AMBULATORY_CARE_PROVIDER_SITE_OTHER): Payer: Medicare Other | Admitting: Adult Health

## 2020-11-23 ENCOUNTER — Other Ambulatory Visit: Payer: Self-pay

## 2020-11-23 ENCOUNTER — Encounter: Payer: Self-pay | Admitting: Adult Health

## 2020-11-23 VITALS — BP 122/66 | HR 69 | Temp 96.4°F | Ht 73.0 in | Wt 233.0 lb

## 2020-11-23 DIAGNOSIS — I700 Atherosclerosis of aorta: Secondary | ICD-10-CM | POA: Diagnosis not present

## 2020-11-23 DIAGNOSIS — E1169 Type 2 diabetes mellitus with other specified complication: Secondary | ICD-10-CM

## 2020-11-23 DIAGNOSIS — Z136 Encounter for screening for cardiovascular disorders: Secondary | ICD-10-CM

## 2020-11-23 DIAGNOSIS — R6889 Other general symptoms and signs: Secondary | ICD-10-CM

## 2020-11-23 DIAGNOSIS — Z79899 Other long term (current) drug therapy: Secondary | ICD-10-CM

## 2020-11-23 DIAGNOSIS — I10 Essential (primary) hypertension: Secondary | ICD-10-CM | POA: Diagnosis not present

## 2020-11-23 DIAGNOSIS — E785 Hyperlipidemia, unspecified: Secondary | ICD-10-CM | POA: Diagnosis not present

## 2020-11-23 DIAGNOSIS — K317 Polyp of stomach and duodenum: Secondary | ICD-10-CM

## 2020-11-23 DIAGNOSIS — Z0001 Encounter for general adult medical examination with abnormal findings: Secondary | ICD-10-CM | POA: Diagnosis not present

## 2020-11-23 DIAGNOSIS — K449 Diaphragmatic hernia without obstruction or gangrene: Secondary | ICD-10-CM

## 2020-11-23 DIAGNOSIS — Z0000 Encounter for general adult medical examination without abnormal findings: Secondary | ICD-10-CM

## 2020-11-23 DIAGNOSIS — E1122 Type 2 diabetes mellitus with diabetic chronic kidney disease: Secondary | ICD-10-CM | POA: Diagnosis not present

## 2020-11-23 DIAGNOSIS — Z8719 Personal history of other diseases of the digestive system: Secondary | ICD-10-CM

## 2020-11-23 DIAGNOSIS — E669 Obesity, unspecified: Secondary | ICD-10-CM

## 2020-11-23 DIAGNOSIS — K2100 Gastro-esophageal reflux disease with esophagitis, without bleeding: Secondary | ICD-10-CM

## 2020-11-23 DIAGNOSIS — Z9889 Other specified postprocedural states: Secondary | ICD-10-CM

## 2020-11-23 DIAGNOSIS — K222 Esophageal obstruction: Secondary | ICD-10-CM

## 2020-11-23 DIAGNOSIS — R011 Cardiac murmur, unspecified: Secondary | ICD-10-CM

## 2020-11-23 DIAGNOSIS — M5136 Other intervertebral disc degeneration, lumbar region: Secondary | ICD-10-CM

## 2020-11-23 DIAGNOSIS — E559 Vitamin D deficiency, unspecified: Secondary | ICD-10-CM

## 2020-11-23 DIAGNOSIS — Z86010 Personal history of colonic polyps: Secondary | ICD-10-CM

## 2020-11-23 DIAGNOSIS — N521 Erectile dysfunction due to diseases classified elsewhere: Secondary | ICD-10-CM

## 2020-11-23 DIAGNOSIS — N182 Chronic kidney disease, stage 2 (mild): Secondary | ICD-10-CM

## 2020-11-23 NOTE — Progress Notes (Signed)
Aorta Scan complete and results were normal. <3

## 2020-11-23 NOTE — Patient Instructions (Addendum)
  Nathaniel Gill , Thank you for taking time to come for your Medicare Wellness Visit. I appreciate your ongoing commitment to your health goals. Please review the following plan we discussed and let me know if I can assist you in the future.   These are the goals we discussed: Goals    . Blood Pressure < 130/80    . DIET - INCREASE WATER INTAKE     Recommend 65-80 fluid ounces daily     . Exercise 150 min/wk Moderate Activity    . LDL CALC < 100    . Weight (lb) < 220 lb (99.8 kg)       This is a list of the screening recommended for you and due dates:  Health Maintenance  Topic Date Due  . Eye exam for diabetics  01/15/2020  . COVID-19 Vaccine (1) 12/13/2020*  . Hemoglobin A1C  12/15/2020  . Complete foot exam   06/16/2021  . Tetanus Vaccine  07/04/2022  . Colon Cancer Screening  11/22/2022  . Hepatitis C Screening: USPSTF Recommendation to screen - Ages 32-79 yo.  Completed  . Pneumonia vaccines  Completed  . HPV Vaccine  Aged Out  . Flu Shot  Discontinued  *Topic was postponed. The date shown is not the original due date.    LDL still above goal of LDL <70 - then need to reduce saturated fat (animal fats, solid fats) and increase soluble fiber further (old fashioned oats, beans, chia/flax, raspberries are excellent sources, all plants have some)  Use fat % and waist circumference to track weight loss progress -

## 2020-11-24 ENCOUNTER — Other Ambulatory Visit: Payer: Self-pay

## 2020-11-24 DIAGNOSIS — D132 Benign neoplasm of duodenum: Secondary | ICD-10-CM

## 2020-11-24 LAB — COMPLETE METABOLIC PANEL WITH GFR
AG Ratio: 1.8 (calc) (ref 1.0–2.5)
ALT: 36 U/L (ref 9–46)
AST: 30 U/L (ref 10–35)
Albumin: 4.9 g/dL (ref 3.6–5.1)
Alkaline phosphatase (APISO): 43 U/L (ref 35–144)
BUN: 15 mg/dL (ref 7–25)
CO2: 30 mmol/L (ref 20–32)
Calcium: 10.7 mg/dL — ABNORMAL HIGH (ref 8.6–10.3)
Chloride: 101 mmol/L (ref 98–110)
Creat: 0.86 mg/dL (ref 0.70–1.25)
GFR, Est African American: 103 mL/min/{1.73_m2} (ref >=60)
GFR, Est Non African American: 89 mL/min/{1.73_m2} (ref >=60)
Globulin: 2.7 g/dL (calc) (ref 1.9–3.7)
Glucose, Bld: 91 mg/dL (ref 65–99)
Potassium: 4.1 mmol/L (ref 3.5–5.3)
Sodium: 139 mmol/L (ref 135–146)
Total Bilirubin: 0.6 mg/dL (ref 0.2–1.2)
Total Protein: 7.6 g/dL (ref 6.1–8.1)

## 2020-11-24 LAB — LIPID PANEL
Cholesterol: 171 mg/dL (ref <200)
HDL: 33 mg/dL — ABNORMAL LOW (ref >=40)
LDL Cholesterol (Calc): 101 mg/dL (calc) — ABNORMAL HIGH
Non-HDL Cholesterol (Calc): 138 mg/dL (calc) — ABNORMAL HIGH (ref <130)
Total CHOL/HDL Ratio: 5.2 (calc) — ABNORMAL HIGH (ref <5.0)
Triglycerides: 244 mg/dL — ABNORMAL HIGH (ref <150)

## 2020-11-24 LAB — CBC WITH DIFFERENTIAL/PLATELET
Absolute Monocytes: 749 cells/uL (ref 200–950)
Basophils Absolute: 77 cells/uL (ref 0–200)
Basophils Relative: 1.1 %
Eosinophils Absolute: 420 cells/uL (ref 15–500)
Eosinophils Relative: 6 %
HCT: 44.5 % (ref 38.5–50.0)
Hemoglobin: 14.9 g/dL (ref 13.2–17.1)
Lymphs Abs: 1596 cells/uL (ref 850–3900)
MCH: 29.9 pg (ref 27.0–33.0)
MCHC: 33.5 g/dL (ref 32.0–36.0)
MCV: 89.2 fL (ref 80.0–100.0)
MPV: 9.7 fL (ref 7.5–12.5)
Monocytes Relative: 10.7 %
Neutro Abs: 4158 cells/uL (ref 1500–7800)
Neutrophils Relative %: 59.4 %
Platelets: 287 10*3/uL (ref 140–400)
RBC: 4.99 10*6/uL (ref 4.20–5.80)
RDW: 12.8 % (ref 11.0–15.0)
Total Lymphocyte: 22.8 %
WBC: 7 10*3/uL (ref 3.8–10.8)

## 2020-11-24 LAB — TSH: TSH: 2.52 mIU/L (ref 0.40–4.50)

## 2020-11-24 LAB — HEMOGLOBIN A1C
Hgb A1c MFr Bld: 6.5 % of total Hgb — ABNORMAL HIGH (ref <5.7)
Mean Plasma Glucose: 140 mg/dL
eAG (mmol/L): 7.7 mmol/L

## 2020-11-24 LAB — MAGNESIUM: Magnesium: 2 mg/dL (ref 1.5–2.5)

## 2020-11-24 NOTE — Telephone Encounter (Signed)
Inbound call from patient to get EDG/EMR rescheduled. Procedures were cancelled due to insurance purpose. Patient now have new insurance and ready to move forward. Best contact number [phone]

## 2020-11-24 NOTE — Telephone Encounter (Signed)
The pt has been rescheduled for 01/21/21 at 1030 am at [facility].  The pt has been advised.  He has new insurance and will come to the office to have that added to the system. I will also send the information to My Chart.

## 2020-12-08 ENCOUNTER — Encounter: Payer: Self-pay | Admitting: Internal Medicine

## 2020-12-14 ENCOUNTER — Other Ambulatory Visit: Payer: Self-pay | Admitting: Internal Medicine

## 2020-12-14 ENCOUNTER — Other Ambulatory Visit: Payer: Self-pay

## 2020-12-14 DIAGNOSIS — Z79899 Other long term (current) drug therapy: Secondary | ICD-10-CM

## 2020-12-14 DIAGNOSIS — I10 Essential (primary) hypertension: Secondary | ICD-10-CM

## 2020-12-14 MED ORDER — AMLODIPINE BESYLATE 10 MG PO TABS: 10.0000 mg | ORAL_TABLET | Freq: Every day | ORAL | 0 refills | Status: DC

## 2020-12-14 MED ORDER — OMEPRAZOLE 40 MG PO CPDR: 40.0000 mg | DELAYED_RELEASE_CAPSULE | Freq: Every day | ORAL | 0 refills | Status: DC

## 2020-12-16 DIAGNOSIS — M25562 Pain in left knee: Secondary | ICD-10-CM | POA: Diagnosis not present

## 2021-01-14 ENCOUNTER — Ambulatory Visit: Payer: 59 | Admitting: Adult Health

## 2021-01-20 ENCOUNTER — Encounter (HOSPITAL_COMMUNITY): Payer: Self-pay | Admitting: Gastroenterology

## 2021-01-20 NOTE — Progress Notes (Signed)
DUE TO COVID-19 ONLY ONE VISITOR IS ALLOWED TO COME WITH YOU AND STAY IN THE WAITING ROOM ONLY DURING PRE OP AND PROCEDURE DAY OF SURGERY.   PCP - Dr Unk Pinto Cardiologist - Dr [name]  Chest x-ray - n/a EKG - 03/24/20 Stress Test - n/a ECHO - 07/07/17 Cardiac Cath - 2005  Sleep Study -  n/a CPAP - none  Aspirin Instructions: Follow your surgeon's instructions on when to stop aspirin prior to surgery,  If no instructions were given by your surgeon then you will need to call the office for those instructions.  Anesthesia review: Yes  STOP now taking any Aspirin (unless otherwise instructed by your surgeon), Aleve, Naproxen, Ibuprofen, Motrin, Advil, Goody's, BC's, all herbal medications, fish oil, and all vitamins.   Coronavirus Screening Covid test n/a - Ambulatory Surgery  Do you have any of the following symptoms:  Cough yes/no: No Fever (>100.73F)  yes/no: No Runny nose yes/no: No Sore throat yes/no: No Difficulty breathing/shortness of breath  yes/no: No  Have you traveled in the last 14 days and where? yes/no: No  Patient verbalized understanding of instructions that were given via phone.

## 2021-01-21 ENCOUNTER — Other Ambulatory Visit: Payer: Self-pay

## 2021-01-21 ENCOUNTER — Ambulatory Visit (HOSPITAL_COMMUNITY)
Admission: RE | Admit: 2021-01-21 | Discharge: 2021-01-21 | Disposition: A | Discharge status: Home / Self Care | Payer: Medicare Other | Attending: Gastroenterology | Admitting: Gastroenterology

## 2021-01-21 ENCOUNTER — Ambulatory Visit (HOSPITAL_COMMUNITY): Payer: Medicare Other | Admitting: Physician Assistant

## 2021-01-21 ENCOUNTER — Encounter (HOSPITAL_COMMUNITY): Payer: Self-pay | Admitting: Gastroenterology

## 2021-01-21 ENCOUNTER — Encounter (HOSPITAL_COMMUNITY)
Admission: RE | Disposition: A | Discharge status: Home / Self Care | Payer: Self-pay | Source: Home / Self Care | Attending: Gastroenterology

## 2021-01-21 DIAGNOSIS — K222 Esophageal obstruction: Secondary | ICD-10-CM

## 2021-01-21 DIAGNOSIS — D132 Benign neoplasm of duodenum: Secondary | ICD-10-CM | POA: Diagnosis not present

## 2021-01-21 DIAGNOSIS — K317 Polyp of stomach and duodenum: Secondary | ICD-10-CM | POA: Diagnosis not present

## 2021-01-21 DIAGNOSIS — K3189 Other diseases of stomach and duodenum: Secondary | ICD-10-CM

## 2021-01-21 DIAGNOSIS — I890 Lymphedema, not elsewhere classified: Secondary | ICD-10-CM | POA: Diagnosis not present

## 2021-01-21 DIAGNOSIS — K449 Diaphragmatic hernia without obstruction or gangrene: Secondary | ICD-10-CM | POA: Diagnosis not present

## 2021-01-21 HISTORY — PX: BIOPSY: SHX5522

## 2021-01-21 HISTORY — PX: ESOPHAGOGASTRODUODENOSCOPY (EGD) WITH PROPOFOL: SHX5813

## 2021-01-21 HISTORY — DX: Cochlear implant status: Z96.21

## 2021-01-21 SURGERY — ESOPHAGOGASTRODUODENOSCOPY (EGD) WITH PROPOFOL
Anesthesia: Monitor Anesthesia Care

## 2021-01-21 MED ORDER — PROPOFOL 500 MG/50ML IV EMUL
INTRAVENOUS | Status: DC | PRN
  Administered 2021-01-21: 100 ug/kg/min via INTRAVENOUS

## 2021-01-21 MED ORDER — LACTATED RINGERS IV SOLN: INTRAVENOUS | Status: DC | PRN

## 2021-01-21 MED ORDER — SODIUM CHLORIDE 0.9 % IV SOLN: INTRAVENOUS | Status: DC

## 2021-01-21 MED ORDER — PROPOFOL 10 MG/ML IV BOLUS
INTRAVENOUS | Status: DC | PRN
  Administered 2021-01-21: 60 mg via INTRAVENOUS
  Administered 2021-01-21 (×3): 20 mg via INTRAVENOUS

## 2021-01-21 MED ORDER — LIDOCAINE 2% (20 MG/ML) 5 ML SYRINGE
INTRAMUSCULAR | Status: DC | PRN
  Administered 2021-01-21: 100 mg via INTRAVENOUS

## 2021-01-21 MED ORDER — GLYCOPYRROLATE PF 0.2 MG/ML IJ SOSY
PREFILLED_SYRINGE | INTRAMUSCULAR | Status: DC | PRN
  Administered 2021-01-21: .1 mg via INTRAVENOUS

## 2021-01-21 SURGICAL SUPPLY — 14 items

## 2021-01-21 NOTE — Transfer of Care (Signed)
Immediate Anesthesia Transfer of Care Note  Patient: Nathaniel Gill  Procedure(s) Performed: ESOPHAGOGASTRODUODENOSCOPY (EGD) WITH PROPOFOL ENDOSCOPIC MUCOSAL RESECTION BIOPSY  Patient Location: PACU  Anesthesia Type:General  Level of Consciousness: awake, alert  and oriented  Airway & Oxygen Therapy: Patient Spontanous Breathing and Patient connected to face mask oxygen  Post-op Assessment: Report given to RN and Post -op Vital signs reviewed and stable  Post vital signs: Reviewed and stable  Last Vitals:  Vitals Value Taken Time  BP 128/86 01/21/21 1036  Temp 36.1 C 01/21/21 1020  Pulse 60 01/21/21 1036  Resp 13 01/21/21 1036  SpO2 98 % 01/21/21 1036  Vitals shown include unvalidated device data.  Last Pain:  Vitals:   01/21/21 1020  TempSrc:   PainSc: 0-No pain         Complications: No notable events documented.

## 2021-01-21 NOTE — Anesthesia Postprocedure Evaluation (Signed)
Anesthesia Post Note  Patient: Nathaniel Gill  Procedure(s) Performed: ESOPHAGOGASTRODUODENOSCOPY (EGD) WITH PROPOFOL ENDOSCOPIC MUCOSAL RESECTION BIOPSY     Patient location during evaluation: Endoscopy Anesthesia Type: MAC Level of consciousness: awake and alert Pain management: pain level controlled Vital Signs Assessment: post-procedure vital signs reviewed and stable Respiratory status: spontaneous breathing, nonlabored ventilation, respiratory function stable and patient connected to nasal cannula oxygen Cardiovascular status: blood pressure returned to baseline and stable Postop Assessment: no apparent nausea or vomiting Anesthetic complications: no   No notable events documented.  Last Vitals:  Vitals:   01/21/21 1045 01/21/21 1057  BP: 128/86 138/86  Pulse: (!) 55 (!) 59  Resp: (!) 22 20  Temp:  (!) 36.2 C  SpO2: 98% 98%    Last Pain:  Vitals:   01/21/21 1057  TempSrc:   PainSc: 0-No pain                 Rosalee Tolley L Carmine Carrozza

## 2021-01-21 NOTE — Op Note (Signed)
[facility] Patient Name: Nathaniel Gill Procedure Date : 01/21/2021 MRN: [id number] Attending MD: [name] , MD Date of Birth: 1951/03/09 CSN: [id number] Age: 70 Admit Type: Outpatient Procedure:                Upper GI endoscopy Indications:              Follow-up of polyps in the duodenum Providers:                [name], MD, Baird Cancer, RN, Hinton Dyer, Ladona Ridgel, Technician, Elmer Sow Referring MD:             Lajuan Lines. Hilarie Fredrickson, MD, Unk Pinto Medicines:                Monitored Anesthesia Care Complications:            No immediate complications. Estimated Blood Loss:     Estimated blood loss was minimal. Procedure:                Pre-Anesthesia Assessment:                           - Prior to the procedure, a History and Physical                            was performed, and patient medications and                            allergies were reviewed. The patient's tolerance of                            previous anesthesia was also reviewed. The risks                            and benefits of the procedure and the sedation                            options and risks were discussed with the patient.                            All questions were answered, and informed consent                            was obtained. Prior Anticoagulants: The patient has                            taken no previous anticoagulant or antiplatelet                            agents except for aspirin. ASA Grade Assessment: II                            - A patient with mild systemic disease. After  reviewing the risks and benefits, the patient was                            deemed in satisfactory condition to undergo the                            procedure.                           After obtaining informed consent, the endoscope was                            passed under direct vision. Throughout the                             procedure, the patient's blood pressure, pulse, and                            oxygen saturations were monitored continuously. The                            [id number] ([id number] Olympus peds colonoscope was                            introduced through the mouth, and advanced to the                            fourth part of duodenum. The upper GI endoscopy was                            accomplished without difficulty. The patient                            tolerated the procedure. Scope In: Scope Out: Findings:      No gross lesions were noted in the entire esophagus.      The Z-line was regular and was found 35 cm from the incisors.      A non-obstructing Schatzki ring was found at the gastroesophageal       junction.      A 5 cm hiatal hernia was present.      No gross lesions were noted in the entire examined stomach.      A medium post mucosectomy scar was found in the 2nd/3rd portion of the       duodenum. There was residual polypoid tissue which had appearance of       clip artifact. This was biopsied with a cold snare for histology to       ensure no recurrence of adenomatous tissue.      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      No other gross lesions were noted in the entire examined duodenum. Impression:               - No gross lesions in esophagus. Z-line regular, 35  cm from the incisors.                           - Non-obstructing Schatzki ring.                           - 5 cm hiatal hernia.                           - No gross lesions in the stomach.                           - Duodenal scar in 2nd/3rd portion of duodenum.                            Small polypoid tissue biopsied to rule out                            recurrence.                           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                           - No other gross lesions in the entire examined                            duodenum. Recommendation:           - The patient will be observed post-procedure,                            until all discharge criteria are met.                           - Discharge patient to home.                           - Patient has a contact number available for                            emergencies. The signs and symptoms of potential                            delayed complications were discussed with the                            patient. Return to normal activities tomorrow.                            Written discharge instructions were provided to the                            patient.                           - Resume previous diet.                           -  Continue present medications.                           - May continue current dosing of PPI if it is                            necessary for symptom management, but otherwise                            will defer any changes to primary GI.                           - Observe patient's clinical course.                           - Repeat upper endoscopy in 2 years for                            surveillance if no adenomatous recurrence,                            otherwise follow up in 1-year for surveillance if                            recurrence is found.                           - The findings and recommendations were discussed                            with the patient.                           - The findings and recommendations were discussed                            with the patient's family. Procedure Code(s):        --- Professional ---                           [phone], Esophagogastroduodenoscopy, flexible,                            transoral; with removal of tumor(s), polyp(s), or                            other lesion(s) by snare technique Diagnosis Code(s):        --- Professional  ---                           K22.2, Esophageal obstruction                           K44.9, Diaphragmatic hernia without obstruction or  gangrene                           K31.89, Other diseases of stomach and duodenum                           I89.0, Lymphedema, not elsewhere classified                           K31.7, Polyp of stomach and duodenum CPT copyright 2019 [employer]. All rights reserved. The codes documented in this report are preliminary and upon coder review may  be revised to meet current compliance requirements. [name], MD 01/21/2021 10:38:38 AM Number of Addenda: 0

## 2021-01-21 NOTE — Anesthesia Preprocedure Evaluation (Addendum)
Anesthesia Evaluation  Patient identified by MRN, date of birth, ID band Patient awake    Reviewed: Allergy & Precautions, NPO status , Patient's Chart, lab work & pertinent test results, reviewed documented beta blocker date and time   Airway Mallampati: II  TM Distance: >3 FB Neck ROM: Full    Dental no notable dental hx. (+) Teeth Intact, Dental Advisory Given   Pulmonary neg pulmonary ROS,    Pulmonary exam normal breath sounds clear to auscultation       Cardiovascular hypertension, Pt. on home beta blockers and Pt. on medications Normal cardiovascular exam Rhythm:Regular Rate:Normal  RBBB  TTE 2019 - Left ventricle: The cavity size was normal. Systolic function was  normal. The estimated ejection fraction was in the range of 55%  to 60%. Wall motion was normal; there were no regional wall  motion abnormalities. Doppler parameters are consistent with  abnormal left ventricular relaxation (grade 1 diastolic  dysfunction).    Neuro/Psych negative neurological ROS  negative psych ROS   GI/Hepatic Neg liver ROS, hiatal hernia, GERD  ,  Endo/Other  diabetes  Renal/GU Renal InsufficiencyRenal disease  negative genitourinary   Musculoskeletal negative musculoskeletal ROS (+)   Abdominal   Peds  Hematology negative hematology ROS (+)   Anesthesia Other Findings   Reproductive/Obstetrics                            Anesthesia Physical Anesthesia Plan  ASA: 3  Anesthesia Plan: MAC   Post-op Pain Management:    Induction: Intravenous  PONV Risk Score and Plan: Propofol infusion and Treatment may vary due to age or medical condition  Airway Management Planned: Natural Airway  Additional Equipment:   Intra-op Plan:   Post-operative Plan:   Informed Consent: I have reviewed the patients History and Physical, chart, labs and discussed the procedure including the risks,  benefits and alternatives for the proposed anesthesia with the patient or authorized representative who has indicated his/her understanding and acceptance.     Dental advisory given  Plan Discussed with: CRNA  Anesthesia Plan Comments:         Anesthesia Quick Evaluation

## 2021-01-21 NOTE — H&P (Signed)
GASTROENTEROLOGY PROCEDURE H&P NOTE   Primary Care Physician: Unk Pinto, MD  HPI: Nathaniel Gill is a 70 y.o. 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 Hilarie Fredrickson:  Low-grade of narrowing Schatzki ring. Dilated to 18 mm with balloon. - 3 cm hiatal hernia. - Normal stomach. 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    GERD (gastroesophageal reflux disease)    Hiatal hernia    History of colon polyps    Hyperlipidemia    Hypertension    No sign of renal artery stenosis   IBS (irritable bowel syndrome)    RBBB    Systolic murmur    no  current problems   Vertigo    Vitamin D deficiency    Past Surgical History:  Procedure Laterality Date   ABDOMINAL SURGERY  1956   Related to bleeding episode.    BIOPSY  01/27/2020   Procedure: BIOPSY;  Surgeon: Rush Landmark Telford Nab., MD;  Location: [place];  Service: Gastroenterology;;   CARDIAC CATHETERIZATION  2005   no intervention per patient   CARPAL TUNNEL RELEASE Left 03/06/2020   Dr. Amedeo Plenty   COCHLEAR IMPLANT Bilateral    COLONOSCOPY     several - Last one 11/22/19   CYST REMOVAL HAND Left 1975   [place]   ENDOSCOPIC MUCOSAL RESECTION N/A 01/27/2020   Procedure: ENDOSCOPIC MUCOSAL RESECTION;  Surgeon: Irving Copas., MD;  Location: [place];  Service: Gastroenterology;  Laterality: N/A;   ENTEROSCOPY N/A 01/27/2020   Procedure: ENTEROSCOPY;  Surgeon: Rush Landmark Telford Nab., MD;  Location: [place];  Service: Gastroenterology;  Laterality: N/A;   HEMOSTASIS CLIP PLACEMENT  01/27/2020   Procedure: HEMOSTASIS CLIP PLACEMENT;  Surgeon: Rush Landmark  Telford Nab., MD;  Location: [place];  Service: Gastroenterology;;   SKIN LESION EXCISION     STAPEDECTOMY Bilateral    x 2 - 10 yrs apart - No MRIs per patient.   SUBMUCOSAL LIFTING INJECTION  01/27/2020   Procedure: SUBMUCOSAL LIFTING INJECTION;  Surgeon: Rush Landmark Telford Nab., MD;  Location: [place];  Service: Gastroenterology;;   SUBMUCOSAL TATTOO INJECTION  01/27/2020   Procedure: SUBMUCOSAL TATTOO INJECTION;  Surgeon: Irving Copas., MD;  Location: [place];  Service: Gastroenterology;;   TRANSTHORACIC ECHOCARDIOGRAM  07/2017   Normal LV size and function.  EF 55-60%.  No RWMA.  Basal septal hypertrophy, but no sign of significant hypertensive heart disease.  Only GR 1 DD.   TRIGGER FINGER RELEASE Right 2014   Thumb   UPPER GI ENDOSCOPY     several - last one 11/22/2019   WISDOM TOOTH EXTRACTION     Current Facility-Administered Medications  Medication Dose Route Frequency Provider Last Rate Last Admin   0.9 %  sodium chloride infusion   Intravenous Continuous Mansouraty, Telford Nab., MD       No Known Allergies Family History  Problem Relation Age of Onset   Heart disease Mother        Atrial flutter   COPD Mother    Heart failure Mother    Kidney disease Father    Arrhythmia Brother    Heart attack Brother  Hypertension Brother    Hyperlipidemia Brother    Hypertension Brother    Hyperlipidemia Brother    Esophageal cancer Neg Hx    Colon cancer Neg Hx    Social History   Socioeconomic History   Marital status: Married    Spouse name: Not on file   Number of children: Not on file   Years of education: Not on file   Highest education level: Not on file  Occupational History   Not on file  Tobacco Use   Smoking status: Never   Smokeless tobacco: Never  Vaping Use   Vaping Use: Never used  Substance and Sexual Activity   Alcohol use: Yes    Comment: occasional wine   Drug use: No    Comment: In 49s - cocaine, marijuana   Sexual  activity: Yes    Partners: Female    Birth control/protection: None  Other Topics Concern   Not on file  Social History Narrative   Not on file   Social Determinants of Health   Financial Resource Strain: Not on file  Food Insecurity: Not on file  Transportation Needs: Not on file  Physical Activity: Not on file  Stress: Not on file  Social Connections: Not on file  Intimate Partner Violence: Not on file    Physical Exam: Vital signs in last 24 hours: Weight:  [105.7 kg] 105.7 kg (07/20 1138)   GEN: NAD EYE: Sclerae anicteric ENT: MMM CV: Non-tachycardic GI: Soft, NT/ND NEURO:  Alert & Oriented x 3  Lab Results: No results for input(s): WBC, HGB, HCT, PLT in the last 72 hours. BMET No results for input(s): NA, K, CL, CO2, GLUCOSE, BUN, CREATININE, CALCIUM in the last 72 hours. LFT No results for input(s): PROT, ALBUMIN, AST, ALT, ALKPHOS, BILITOT, BILIDIR, IBILI in the last 72 hours. PT/INR No results for input(s): LABPROT, INR in the last 72 hours.   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  The risks and benefits of endoscopic evaluation were discussed with the patient; these include but are not limited to the risk of perforation, infection, bleeding, missed lesions, lack of diagnosis, severe illness requiring hospitalization, as well as anesthesia and sedation related illnesses.  The patient is agreeable to proceed.    Nathaniel Britain, MD [facility] Office # [phone]

## 2021-01-22 LAB — SURGICAL PATHOLOGY

## 2021-01-24 ENCOUNTER — Encounter: Payer: Self-pay | Admitting: Gastroenterology

## 2021-01-25 ENCOUNTER — Encounter (HOSPITAL_COMMUNITY): Payer: Self-pay | Admitting: Gastroenterology

## 2021-03-02 ENCOUNTER — Other Ambulatory Visit: Payer: Self-pay

## 2021-03-02 ENCOUNTER — Ambulatory Visit (INDEPENDENT_AMBULATORY_CARE_PROVIDER_SITE_OTHER): Payer: PRIVATE HEALTH INSURANCE | Admitting: Adult Health

## 2021-03-02 ENCOUNTER — Encounter: Payer: Self-pay | Admitting: Adult Health

## 2021-03-02 VITALS — BP 140/80 | HR 57 | Temp 97.7°F | Wt 230.0 lb

## 2021-03-02 DIAGNOSIS — E1122 Type 2 diabetes mellitus with diabetic chronic kidney disease: Secondary | ICD-10-CM | POA: Diagnosis not present

## 2021-03-02 DIAGNOSIS — Z79899 Other long term (current) drug therapy: Secondary | ICD-10-CM

## 2021-03-02 DIAGNOSIS — I10 Essential (primary) hypertension: Secondary | ICD-10-CM

## 2021-03-02 DIAGNOSIS — E1169 Type 2 diabetes mellitus with other specified complication: Secondary | ICD-10-CM | POA: Diagnosis not present

## 2021-03-02 DIAGNOSIS — E559 Vitamin D deficiency, unspecified: Secondary | ICD-10-CM

## 2021-03-02 DIAGNOSIS — E785 Hyperlipidemia, unspecified: Secondary | ICD-10-CM

## 2021-03-02 DIAGNOSIS — N521 Erectile dysfunction due to diseases classified elsewhere: Secondary | ICD-10-CM

## 2021-03-02 DIAGNOSIS — N182 Chronic kidney disease, stage 2 (mild): Secondary | ICD-10-CM

## 2021-03-02 DIAGNOSIS — I700 Atherosclerosis of aorta: Secondary | ICD-10-CM | POA: Diagnosis not present

## 2021-03-02 DIAGNOSIS — E669 Obesity, unspecified: Secondary | ICD-10-CM

## 2021-03-02 NOTE — Progress Notes (Signed)
3 MONTH FOLLOW UP  Assessment and Plan:  Atherosclerosis of aorta Per imaging, CXR 07/2018 Control blood pressure, cholesterol, glucose, increase exercise.   Resistant hypertension Fairly controlled on current medications per home log Reviewed carvedilol should be taken BID Check BP when feeling unwell mid morning and report numbers Clarify when taking which med Monitor blood pressure at home; call if consistently over 130/80 Continue DASH diet.   Reminder to go to the ER if any CP, SOB, nausea, dizziness, severe HA, changes vision/speech, left arm numbness and tingling and jaw pain.  T2DM [facility])  Education: Reviewed 'ABCs' of diabetes management (respective goals in parentheses):  A1C (<7), blood pressure (<130/80), and cholesterol (LDL <70) Eye Exam yearly and Dental Exam every 6 months. Dietary recommendations Physical Activity recommendations - A1C  Hyperlipidemia associated with T2DM (HCC) Continue medications - has been well controlled in past with rosuvastatin  LDL goal <70; reviewed low saturated fat, increase soluble fiber  Continue weight management and exercise.  - Lipid panel  Erectile dysfunction associated with T2DM (HCC)  Control blood pressure, cholesterol, glucose, increase exercise.  Declines med at this time  CKD II associated with T2DM (HCC) Increase fluids, avoid NSAIDS, monitor sugars, will monitor - CMP/GFR  Obesity- BMI 30  Long discussion about weight loss, diet, and exercise Recommended diet heavy in fruits and veggies and low in animal meats, cheeses, and dairy products, appropriate calorie intake Discussed appropriate weight for height Off of phentermine - limited benefit  Doing well per home measurements Follow up at next visit  Medication management CBC, CMP/GFR, magnesium  Gastroesophageal reflux disease S/p dilation esophageal stricture S/p dilation, continue PPI Denies recurrent sx; he prefers to defer repeat unless  symptomatic  Duodenal polyp S/p resection; Dr. Rush Landmark follows  Vitamin D deficiency Continue supplementation Defer vitamin D level  Arthritis/decenerative disk Continue follow up with ortho, PRN gabapentin, NSAIDs working well    Discussed med's effects and SE's. labs and tests as requested with regular follow-up as recommended. Over 30 minutes of exam, counseling, chart review, coordination of care and critical decision making was performed.  Future Appointments  Date Time Provider [place]  06/16/2021 10:00 AM Liane Comber, NP [facility] None  11/23/2021  3:30 PM Liane Comber, NP [facility] None    HPI BP 140/80   Pulse (!) 57   Temp 97.7 F (36.5 C)   Wt 230 lb (104.3 kg)   SpO2 99%   BMI 30.34 kg/m   The patient is a very pleasant 70 y.o., presents for 3 month follow up on htn, hyperlipidemia, T2DM with CKD and ED, obesity, vitamin D def.   First grandchild (boy) due in November. 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 Lots of walking, average 10000+ steps daily.   He follows with [facility] Pupa for multiple ortho complaints. Has had lumbar pain, degenerative disk with intermittent R radicular sx, has had injection with good results, also benefits from PRN gabapentin 300 mg, advil/aleve.     He has a diagnosis of GERD, He had colonoscopy and EGD by Dr. Hilarie Fredrickson on 11/22/2019 which showed 3 cm hital hernia, low-grade of narrowing schatzki ring (dilated), adenomaous duodenal polyp underwent resection on 01/27/2020 by Dr. Rush Landmark. Colonoscopy showed several adenomatous polyps recommended for 3 year follow up. Continue omeprazole 40 mg daily, and reports has elevated HOB 2 inches with fully resolved GERD.   BMI is Body mass index is 30.34 kg/m., he has been  working on diet and exercise. Stopped phentermine due to lack of sufficient perceived benefit.  Has protein drink for breakfast, nuts for  snack, lean cuisine for lunch, mid afternoon snack, will do salad with protein for dinner. Will do  Fat % at home around 17.4 %, notes losing inches at waist, 222 lb on home scale.  Wt Readings from Last 3 Encounters:  03/02/21 230 lb (104.3 kg)  01/21/21 233 lb (105.7 kg)  11/23/20 233 lb (105.7 kg)   He has atherosclerosis of aorta noted on imaging (CXR 07/2018)  His blood pressure has been controlled at home (120s-140s/70s-80s), today their BP is BP: 140/80. Hx of resistant hypertension, follows with Dr. Ellyn Hack annually. He has noted mid morning will have 5 min of abruptly feeling dizzy/unwell. Hasn't checked BP or blood sugar when this happens. Denies CP, dyspnea. He is taking olmesartan/HCTZ 40/25, amlodipine 10, carvedilol 12. 5 mg (but only taking once daily rather than BID - ). Only taking 1 BP med in AM, unsure which.   He had essentially normal ECHO in 07/2017 obtained for murmur evaluation; attributed to mild septal thickening.   He does workout. He denies chest pain, shortness of breath.    He is on cholesterol medication (rosuvastatin 40 mg daily) and denies myalgias. His cholesterol is not at goal. The cholesterol last visit was:   Lab Results  Component Value Date   CHOL 171 11/23/2020   HDL 33 (L) 11/23/2020   LDLCALC 101 (H) 11/23/2020   TRIG 244 (H) 11/23/2020   CHOLHDL 5.2 (H) 11/23/2020    He has been working on diet and exercise for T2DM managed by lifestyle (6.5% 04/2018, 6.6% 02/2019), he is on ASA, ACEi, statin, off of metformin due to excellent control and denies foot ulcerations, increased appetite, nausea, paresthesia of the feet, polydipsia, polyuria and visual disturbances. He does have erectile dysfunction, but not recently active, has declined sildenafil. Doesn't have glucometer, has always been well controlled.  Last A1C in the office was:  Lab Results  Component Value Date   HGBA1C 6.5 (H) 11/23/2020    He has stable CKD II associated with T2DM monitored  via this office:  Lab Results  Component Value Date   GFRNONAA 89 11/23/2020   Patient is on Vitamin D supplement, ? 4000 IU daily Lab Results  Component Value Date   VD25OH 64 06/16/2020       Current Medications:  Current Outpatient Medications on File Prior to Visit  Medication Sig Dispense Refill   amLODipine (NORVASC) 10 MG tablet Take 1 tablet (10 mg total) by mouth daily. Take  1 tablet  Daily  for BP 90 tablet 0   Ascorbic Acid (VITAMIN C) 1000 MG tablet Take 1,000 mg by mouth daily.     aspirin 81 MG tablet Take 81 mg by mouth daily.     BIOTIN PO Take 1 tablet by mouth daily.     carvedilol (COREG) 12.5 MG tablet TAKE ONE TABLET BY MOUTH TWICE A DAY (Patient taking differently: Take 12.5 mg by mouth at bedtime.) 180 tablet 3   Magnesium 400 MG TABS Take 400 mg by mouth daily.     olmesartan-hydrochlorothiazide (BENICAR HCT) 40-25 MG tablet TAKE ONE TABLET BY MOUTH DAILY FOR BLOOD PRESSURE (Patient taking differently: Take 1 tablet by mouth daily. TAKE ONE TABLET BY MOUTH DAILY FOR BLOOD PRESSURE) 90 tablet 3   omeprazole (PRILOSEC) 40 MG capsule Take 1 capsule (40 mg total) by mouth daily. Take  1 capsule     Daily         to Prevent Heartburn & Indigestion (Patient taking differently: Take 40 mg by mouth at bedtime. Take     1 capsule     Daily         to Prevent Heartburn & Indigestion) 90 capsule 0   OVER THE COUNTER MEDICATION Place 1 spray into the nose daily as needed (congestion). Xylitol (X-Clear)     Pyridoxine HCl (B-6) 100 MG TABS Take 100 mg by mouth daily.     rosuvastatin (CRESTOR) 40 MG tablet TAKE ONE TABLET BY MOUTH DAILY FOR CHOLESTEROL (Patient taking differently: Take 40 mg by mouth at bedtime. TAKE ONE TABLET BY MOUTH DAILY FOR CHOLESTEROL) 90 tablet 1   vitamin E 200 UNIT capsule Take 200 Units by mouth 2 (two) times daily.     Zinc 50 MG TABS Take 50 mg by mouth daily.      No current facility-administered medications on file prior to visit.    Allergies:  No Known Allergies  Medical History:  has Resistant hypertension; Hyperlipidemia associated with type 2 diabetes mellitus ([place]); Degenerative lumbar disc; RBBB (right bundle branch block); Type 2 diabetes mellitus ([place]); Acid reflux; Vitamin D deficiency; Medication management; Erectile dysfunction associated with type 2 diabetes mellitus ([place]); Systolic murmur; Obesity (BMI 30.0-34.9); Atherosclerosis of aorta ([place]); CKD stage 2 due to type 2 diabetes mellitus ([place]); Hiatal hernia; History of adenomatous polyp of colon; Polyp of duodenum; and S/P dilatation of esophageal stricture on their problem list. Surgical History:  He  has a past surgical history that includes Cardiac catheterization (2005); Abdominal surgery (1956); Stapedectomy (Bilateral); Trigger finger release (Right, 2014); Cyst removal hand (Left, 1975); Skin lesion excision; transthoracic echocardiogram (07/2017); Colonoscopy; Upper gi endoscopy; Wisdom tooth extraction; Endoscopic mucosal resection (N/A, 01/27/2020); biopsy (01/27/2020); Submucosal lifting injection (01/27/2020); Submucosal tattoo injection (01/27/2020); Hemostasis clip placement (01/27/2020); enteroscopy (N/A, 01/27/2020); Carpal tunnel release (Left, 03/06/2020); Cochlear implant (Bilateral); Esophagogastroduodenoscopy (egd) with propofol (N/A, 01/21/2021); and biopsy (01/21/2021). Family History:  His family history includes Arrhythmia in his brother; COPD in his mother; Heart attack in his brother; Heart disease in his mother; Heart failure in his mother; Hyperlipidemia in his brother and brother; Hypertension in his brother and brother; Kidney disease in his father. Social History:   reports that he has never smoked. He has never used smokeless tobacco. He reports current alcohol use. He reports that he does not use drugs.    Review of Systems:  Review of Systems  Constitutional: Negative.  Negative for weight loss.  HENT:  Negative for congestion,  hearing loss and sore throat.   Eyes:  Negative for blurred vision.  Respiratory:  Negative for cough, shortness of breath and wheezing.   Cardiovascular:  Negative for chest pain and palpitations.  Gastrointestinal:  Negative for abdominal pain, blood in stool, constipation, melena and nausea.  Genitourinary: Negative.   Musculoskeletal:  Positive for joint pain (L knee, intermittent, ortho follows). Negative for back pain (lumbar, improved) and myalgias.  Skin: Negative.   Neurological:  Negative for dizziness, sensory change and headaches.  Endo/Heme/Allergies: Negative.  Negative for environmental allergies. Does not bruise/bleed easily.  Psychiatric/Behavioral: Negative.     Physical Exam: Estimated body mass index is 30.34 kg/m as calculated from the following:   Height as of 01/21/21: '6\' 1"'$  (1.854 m).   Weight as of this encounter: 230 lb (104.3 kg). BP 140/80   Pulse (!) 57   Temp 97.7  F (36.5 C)   Wt 230 lb (104.3 kg)   SpO2 99%   BMI 30.34 kg/m    General Appearance: Well nourished, in no apparent distress.  Eyes: PERRLA, EOMs, conjunctiva no swelling or erythema, fundal exam deferred to ophth Sinuses: No Frontal/maxillary tenderness  ENT/Mouth: Ext aud canals clear, normal light reflex with TMs without erythema, bulging. Good dentition. No erythema, swelling, or exudate on post pharynx. Tonsils not swollen or erythematous. Hearing normal.  Neck: Supple, thyroid normal. No bruits  Respiratory: Respiratory effort normal, BS equal bilaterally without rales, rhonchi, wheezing or stridor.  Cardio: RR, bradycardic, without murmurs, rubs or gallops. Brisk peripheral pulses without edema.  Abdomen: Soft, non-tender. Lymphatics: Non tender without lymphadenopathy.  Musculoskeletal: Full ROM all peripheral extremities,5/5 strength, and normal gait.  Skin: Warm, dry without rashes, lesions, ecchymosis. Neuro: Cranial nerves intact, reflexes equal bilaterally. Normal muscle tone,  no cerebellar symptoms. Sensation intact.  Psych: Awake and oriented X 3, normal affect, Insight and Judgment appropriate.   Izora Ribas, NP 4:35 PM [facility]

## 2021-03-03 LAB — COMPLETE METABOLIC PANEL WITH GFR
AG Ratio: 2 (calc) (ref 1.0–2.5)
ALT: 35 U/L (ref 9–46)
AST: 29 U/L (ref 10–35)
Albumin: 4.7 g/dL (ref 3.6–5.1)
Alkaline phosphatase (APISO): 41 U/L (ref 35–144)
BUN: 14 mg/dL (ref 7–25)
CO2: 28 mmol/L (ref 20–32)
Calcium: 10.5 mg/dL — ABNORMAL HIGH (ref 8.6–10.3)
Chloride: 102 mmol/L (ref 98–110)
Creat: 0.86 mg/dL (ref 0.70–1.35)
Globulin: 2.4 g/dL (calc) (ref 1.9–3.7)
Glucose, Bld: 90 mg/dL (ref 65–99)
Potassium: 3.9 mmol/L (ref 3.5–5.3)
Sodium: 139 mmol/L (ref 135–146)
Total Bilirubin: 0.5 mg/dL (ref 0.2–1.2)
Total Protein: 7.1 g/dL (ref 6.1–8.1)
eGFR: 94 mL/min/{1.73_m2} (ref >=60)

## 2021-03-03 LAB — CBC WITH DIFFERENTIAL/PLATELET
Absolute Monocytes: 708 cells/uL (ref 200–950)
Basophils Absolute: 71 cells/uL (ref 0–200)
Basophils Relative: 1.2 %
Eosinophils Absolute: 472 cells/uL (ref 15–500)
Eosinophils Relative: 8 %
HCT: 42.1 % (ref 38.5–50.0)
Hemoglobin: 14.1 g/dL (ref 13.2–17.1)
Lymphs Abs: 1670 cells/uL (ref 850–3900)
MCH: 29.6 pg (ref 27.0–33.0)
MCHC: 33.5 g/dL (ref 32.0–36.0)
MCV: 88.4 fL (ref 80.0–100.0)
MPV: 9.5 fL (ref 7.5–12.5)
Monocytes Relative: 12 %
Neutro Abs: 2980 cells/uL (ref 1500–7800)
Neutrophils Relative %: 50.5 %
Platelets: 305 10*3/uL (ref 140–400)
RBC: 4.76 10*6/uL (ref 4.20–5.80)
RDW: 13 % (ref 11.0–15.0)
Total Lymphocyte: 28.3 %
WBC: 5.9 10*3/uL (ref 3.8–10.8)

## 2021-03-03 LAB — HEMOGLOBIN A1C
Hgb A1c MFr Bld: 6.2 % of total Hgb — ABNORMAL HIGH (ref <5.7)
Mean Plasma Glucose: 131 mg/dL
eAG (mmol/L): 7.3 mmol/L

## 2021-03-03 LAB — LIPID PANEL
Cholesterol: 161 mg/dL (ref <200)
HDL: 40 mg/dL (ref >=40)
LDL Cholesterol (Calc): 90 mg/dL (calc)
Non-HDL Cholesterol (Calc): 121 mg/dL (calc) (ref <130)
Total CHOL/HDL Ratio: 4 (calc) (ref <5.0)
Triglycerides: 222 mg/dL — ABNORMAL HIGH (ref <150)

## 2021-03-03 LAB — TSH: TSH: 1.06 mIU/L (ref 0.40–4.50)

## 2021-03-03 LAB — MAGNESIUM: Magnesium: 2.1 mg/dL (ref 1.5–2.5)

## 2021-03-14 ENCOUNTER — Other Ambulatory Visit: Payer: Self-pay | Admitting: Internal Medicine

## 2021-03-14 DIAGNOSIS — Z79899 Other long term (current) drug therapy: Secondary | ICD-10-CM

## 2021-03-14 DIAGNOSIS — I10 Essential (primary) hypertension: Secondary | ICD-10-CM

## 2021-03-14 MED ORDER — OMEPRAZOLE 40 MG PO CPDR: DELAYED_RELEASE_CAPSULE | ORAL | 3 refills | Status: DC

## 2021-03-14 MED ORDER — AMLODIPINE BESYLATE 10 MG PO TABS: ORAL_TABLET | ORAL | 0 refills | Status: DC

## 2021-04-20 NOTE — Telephone Encounter (Signed)
So he should be due to be seen soon.  He was scheduled last seen in September 2021 with an annual follow-up recommended.  Hard to know what to do.  With the slight increase in blood pressure, I would probably suggest adding low-dose Lasix.  But to do so I would want to see baseline labs.  He should have lipids and chemistries done as part of a follow-up labs for this year anyway.  If we can get him in to see me, perhaps to get into see an APP, because he is due to be seen this year anyway.  10 pound weight gain is difficult to fully understand, but there is edema, perhaps adding Lasix 20 to 40 mg 3 days a week would be reasonable.  [name], MD

## 2021-04-28 DIAGNOSIS — M79671 Pain in right foot: Secondary | ICD-10-CM | POA: Diagnosis not present

## 2021-04-28 DIAGNOSIS — M79672 Pain in left foot: Secondary | ICD-10-CM | POA: Diagnosis not present

## 2021-04-28 DIAGNOSIS — M25562 Pain in left knee: Secondary | ICD-10-CM | POA: Diagnosis not present

## 2021-05-10 ENCOUNTER — Other Ambulatory Visit: Payer: Self-pay

## 2021-05-10 ENCOUNTER — Encounter: Payer: Self-pay | Admitting: Cardiology

## 2021-05-10 ENCOUNTER — Ambulatory Visit (INDEPENDENT_AMBULATORY_CARE_PROVIDER_SITE_OTHER): Payer: Medicare Other | Admitting: Cardiology

## 2021-05-10 VITALS — BP 138/72 | HR 53 | Ht 73.0 in | Wt 233.2 lb

## 2021-05-10 DIAGNOSIS — I4510 Unspecified right bundle-branch block: Secondary | ICD-10-CM

## 2021-05-10 DIAGNOSIS — I10 Essential (primary) hypertension: Secondary | ICD-10-CM | POA: Diagnosis not present

## 2021-05-10 DIAGNOSIS — I700 Atherosclerosis of aorta: Secondary | ICD-10-CM | POA: Diagnosis not present

## 2021-05-10 DIAGNOSIS — E669 Obesity, unspecified: Secondary | ICD-10-CM

## 2021-05-10 DIAGNOSIS — E785 Hyperlipidemia, unspecified: Secondary | ICD-10-CM

## 2021-05-10 DIAGNOSIS — E1122 Type 2 diabetes mellitus with diabetic chronic kidney disease: Secondary | ICD-10-CM

## 2021-05-10 DIAGNOSIS — E1169 Type 2 diabetes mellitus with other specified complication: Secondary | ICD-10-CM | POA: Diagnosis not present

## 2021-05-10 DIAGNOSIS — N182 Chronic kidney disease, stage 2 (mild): Secondary | ICD-10-CM

## 2021-05-10 NOTE — Patient Instructions (Signed)
Medication Instructions:  No changes      Lab Work:  Not needed   Testing/Procedures:  CT coronary calcium score. This test is done at [address]. This is $99 out of pocket.   Coronary CalciumScan A coronary calcium scan is an imaging test used to look for deposits of calcium and other fatty materials (plaques) in the inner lining of the blood vessels of the heart (coronary arteries). These deposits of calcium and plaques can partly clog and narrow the coronary arteries without producing any symptoms or warning signs. This puts a person at risk for a heart attack. This test can detect these deposits before symptoms develop. Tell a health care provider about: Any allergies you have. All medicines you are taking, including vitamins, herbs, eye drops, creams, and over-the-counter medicines. Any problems you or family members have had with anesthetic medicines. Any blood disorders you have. Any surgeries you have had. Any medical conditions you have. Whether you are pregnant or may be pregnant. What are the risks? Generally, this is a safe procedure. However, problems may occur, including: Harm to a pregnant woman and her unborn baby. This test involves the use of radiation. Radiation exposure can be dangerous to a pregnant woman and her unborn baby. If you are pregnant, you generally should not have this procedure done. Slight increase in the risk of cancer. This is because of the radiation involved in the test. What happens before the procedure? No preparation is needed for this procedure. What happens during the procedure? You will undress and remove any jewelry around your neck or chest. You will put on a hospital gown. Sticky electrodes will be placed on your chest. The electrodes will be connected to an electrocardiogram (ECG) machine to record a tracing of the electrical activity of your heart. A CT scanner will take pictures of your heart. During this  time, you will be asked to lie still and hold your breath for 2-3 seconds while a picture of your heart is being taken. The procedure may vary among health care providers and hospitals. What happens after the procedure? You can get dressed. You can return to your normal activities. It is up to you to get the results of your test. Ask your health care provider, or the department that is doing the test, when your results will be ready. Summary A coronary calcium scan is an imaging test used to look for deposits of calcium and other fatty materials (plaques) in the inner lining of the blood vessels of the heart (coronary arteries). Generally, this is a safe procedure. Tell your health care provider if you are pregnant or may be pregnant. No preparation is needed for this procedure. A CT scanner will take pictures of your heart. You can return to your normal activities after the scan is done. This information is not intended to replace advice given to you by your health care provider. Make sure you discuss any questions you have with your health care provider. Document Released: 12/17/2007 Document Revised: 05/09/2016 Document Reviewed: 05/09/2016 Elsevier Interactive Patient Education  2017 [place]: At [facility], you and your health needs are our priority.  As part of our continuing mission to provide you with exceptional heart care, we have created designated Provider Care Teams.  These Care Teams include your primary Cardiologist (physician) and Advanced Practice Providers (APPs -  Physician Assistants and Nurse Practitioners) who all work together to provide you with the care you  need, when you need it.     Your next appointment:   12 month(s)  The format for your next appointment:   In Person  Provider:   [name], MD

## 2021-05-10 NOTE — Progress Notes (Signed)
Primary Care Provider: Unk Pinto, MD Cardiologist: [name], MD Electrophysiologist: None  Clinic Note: Chief Complaint  Patient presents with   Follow-up    Unusual sensations and strange feeling over the last few weeks.  Blood pressures have have been up and down.    ===================================  ASSESSMENT/PLAN   Problem List Items Addressed This Visit       Cardiology Problems   Hyperlipidemia associated with type 2 diabetes mellitus (HCC) (Chronic)    Other than elevated triglycerides, his labs do not look that bad.  He is on a pretty deep dose of rosuvastatin.  Triglycerides being elevated is likely related to diabetes.  Plan: Continue statin for now, but low threshold to consider additional therapy. Will check coronary calcium score for Cardiac Risk Stratification      Relevant Orders   CT CARDIAC SCORING (SELF PAY ONLY)   Resistant hypertension - Primary (Chronic)    His blood pressures seem to be better controlled at home than they are here.  He is on a good dose of beta-blocker and amlodipine as well as ARB And diuretic.  With resting heart rate below 60, unable to titrate carvedilol further.  He is on max dose of Benicar-HCTZ.  I would be reluctant to add additional medication -> Next likely would be spironolactone      RBBB (right bundle branch block) (Chronic)    Stable.  Split S2 noted on exam.      Relevant Orders   EKG 12-Lead (Completed)   CT CARDIAC SCORING (SELF PAY ONLY)   Atherosclerosis of aorta (HCC) (Chronic)    .No sign of AAA on with aortic atherosclerosis, will evaluate coronary disease with coronary calcium score.  Continue to address cardiovascular risk      Relevant Orders   CT CARDIAC SCORING (SELF PAY ONLY)     Other   Obesity (BMI 30.0-34.9)    The patient understands the need to lose weight with diet and exercise. We have discussed specific strategies for this.   - part of Metabolic Syndrome with HTN, &  High TG & ~DM-2      Relevant Orders   EKG 12-Lead (Completed)   CT CARDIAC SCORING (SELF PAY ONLY)   CKD stage 2 due to type 2 diabetes mellitus ([place])    Appropriately on ARB.  Stable renal function       ===================================  HPI:    Nathaniel Gill is a 70 y.o. male with a PMH Notable for Resistant Hypertensionm and Hyperlipidemia who presents today for annual follow-up.  Nathaniel Gill was last seen on March 24, 2020 (had seen him via telemedicine in September 2020 at which time he indicated that he had been exercising routinely at the [facility] until COVID-19 lockdown and then got out of the habit.  Also noted 2 brothers were diagnosed with A. fib.).  Was doing well.  With adjusting his diet to a more keto type diet, but unfortunately did not get back into exercise-trying to get in about 30 minutes most days (walking and yoga as well as recumbent bike.  Not able to use treadmill anymore because of back pain.).  He had a couple episodes of feeling his heart rate go fast lasting most entire day.  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  Small polypoid tissue was biopsied to rule out recurrence.  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  Reviewed  CV studies:    The following studies were reviewed today: (if available, images/films reviewed: From Epic Chart or Care Everywhere) None:   Interval History:   Nathaniel Gill presents here today for annual follow-up-actually delayed annual follow-up.  He also has been noticing some unusual symptoms couple weeks. He called in on October 17 noticing some "weirdness for a week or so.  Very vague episodes where BP was elevated, heart rate low 50s.  Intermittently feeling lightheaded and dizzy.  Noted that he does drink more than 64 ounces of fluid a day.  Describes it as an  generalized fuzzy head feeling some fuzzy head sensation in his head.  Noticed high-pitched sound in his left ear.   He also noted a weight gain of about 10 pounds.  Still having some lightheadedness but not near syncope.  Lower extremity edema no dyspnea.  Nathaniel Gill returns today stating that overall, symptoms seem to be getting better.  He is not really noticing the unusual sensations anymore.  His blood pressures have been more stabilized now appearing and in fact his pressures have been more in the 120/60 range and as high as 631S and 970Y systolic on occasion.  He has trivial edema.  He is now starting to walk more frequently is doing at least a mile a day.  He is not really noticing any unusual symptoms anymore.  Working hard stay hydrated, he is not having any lightheadedness or dizziness.  Number because he had symptoms.  He thinks he may dyslipidemia more than usual which may explain why his weight is up.  Has been going up and down.  CV Review of Symptoms (Summary) Cardiovascular ROS: positive for - -dizziness/foggy headedness with vague weird symptoms that are no longer present.  Heart rate has been low, but not limiting activity.  Trivial edema. negative for - chest pain, dyspnea on exertion, irregular heartbeat, loss of consciousness, orthopnea, palpitations, paroxysmal nocturnal dyspnea, rapid heart rate, shortness of breath, or near syncope/syncope or TIA/amaurosis fugax, -claudication  REVIEWED OF SYSTEMS   Review of Systems  Constitutional:  Positive for malaise/fatigue (Not as energetic as usual). Negative for weight loss (Weight weight is gone up and down).  HENT:  Negative for congestion and nosebleeds.   Respiratory:  Positive for shortness of breath (A little bit with exertion). Negative for cough.   Gastrointestinal:  Negative for blood in stool and melena.       For the most part his IBS has been uncontrolled.  Genitourinary:  Negative for hematuria.  Musculoskeletal:  Negative  for joint pain.  Neurological:  Positive for dizziness and weakness (Intermittent episodes of). Negative for focal weakness.  Psychiatric/Behavioral:  Negative for depression and hallucinations. The patient is not nervous/anxious and does not have insomnia.   Dizziness/palpitations, joint pain, back  I have reviewed and (if needed) personally updated the patient's problem list, medications, allergies, past medical and surgical history, social and family history.   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 Hilarie Fredrickson:  Low-grade of narrowing Schatzki ring. Dilated to 18 mm with balloon. - 3 cm hiatal hernia. - Normal stomach. - A single duodenal polyp with appearance most consistent with adenoma. - Duodenal mucosal  lymphangiectasias.    GERD (gastroesophageal reflux disease)    Hiatal hernia    History of colon polyps    Hyperlipidemia    Hypertension    No sign of renal artery stenosis   IBS (irritable bowel syndrome)    RBBB    Systolic murmur    no  current problems   Vertigo    Vitamin D deficiency     PAST SURGICAL HISTORY   Past Surgical History:  Procedure Laterality Date   ABDOMINAL SURGERY  1956   Related to bleeding episode.    BIOPSY  01/27/2020   Procedure: BIOPSY;  Surgeon: Rush Landmark Telford Nab., MD;  Location: [place];  Service: Gastroenterology;;   BIOPSY  01/21/2021   Procedure: BIOPSY;  Surgeon: Irving Copas., MD;  Location: [place];  Service: Gastroenterology;;   CARDIAC CATHETERIZATION  2005   no intervention per patient   CARPAL TUNNEL RELEASE Left 03/06/2020   Dr. Amedeo Plenty   COCHLEAR IMPLANT Bilateral    COLONOSCOPY     several - Last one 11/22/19   CYST REMOVAL HAND Left 1975   [place]   ENDOSCOPIC MUCOSAL  RESECTION N/A 01/27/2020   Procedure: ENDOSCOPIC MUCOSAL RESECTION;  Surgeon: Irving Copas., MD;  Location: [place];  Service: Gastroenterology;  Laterality: N/A;   ENTEROSCOPY N/A 01/27/2020   Procedure: ENTEROSCOPY;  Surgeon: Rush Landmark Telford Nab., MD;  Location: [place];  Service: Gastroenterology;  Laterality: N/A;   ESOPHAGOGASTRODUODENOSCOPY (EGD) WITH PROPOFOL N/A 01/21/2021   Procedure: ESOPHAGOGASTRODUODENOSCOPY (EGD) WITH PROPOFOL;  Surgeon: Rush Landmark Telford Nab., MD;  Location: [place];  Service: Gastroenterology;  Laterality: N/A;   HEMOSTASIS CLIP PLACEMENT  01/27/2020   Procedure: HEMOSTASIS CLIP PLACEMENT;  Surgeon: Rush Landmark Telford Nab., MD;  Location: [place];  Service: Gastroenterology;;   SKIN LESION EXCISION     STAPEDECTOMY Bilateral    x 2 - 10 yrs apart - No MRIs per patient.   SUBMUCOSAL LIFTING INJECTION  01/27/2020   Procedure: SUBMUCOSAL LIFTING INJECTION;  Surgeon: Rush Landmark Telford Nab., MD;  Location: [place];  Service: Gastroenterology;;   SUBMUCOSAL TATTOO INJECTION  01/27/2020   Procedure: SUBMUCOSAL TATTOO INJECTION;  Surgeon: Irving Copas., MD;  Location: [place];  Service: Gastroenterology;;   TRANSTHORACIC ECHOCARDIOGRAM  07/2017   Normal LV size and function.  EF 55-60%.  No RWMA.  Basal septal hypertrophy, but no sign of significant hypertensive heart disease.  Only GR 1 DD.   TRIGGER FINGER RELEASE Right 2014   Thumb   UPPER GI ENDOSCOPY     several - last one 11/22/2019   WISDOM TOOTH EXTRACTION      Immunization History  Administered Date(s) Administered   Influenza Split 08/28/2007   Pneumococcal Conjugate-13 04/16/2018   Pneumococcal Polysaccharide-23 05/20/2019   Tdap 11/19/2010    MEDICATIONS/ALLERGIES   Current Meds  Medication Sig   amLODipine (NORVASC) 10 MG tablet Take  1 tablet  Daily  for BP   Ascorbic Acid (VITAMIN C) 1000 MG tablet Take 1,000 mg by mouth daily.   aspirin  81 MG tablet Take 81 mg by mouth daily.   BIOTIN PO Take 1 tablet by mouth daily.   carvedilol (COREG) 12.5 MG tablet TAKE ONE TABLET BY MOUTH TWICE A DAY (Patient taking differently: Take 12.5 mg by mouth at bedtime.)   Magnesium 400 MG TABS Take 400 mg by mouth daily.   olmesartan-hydrochlorothiazide (BENICAR HCT) 40-25 MG tablet TAKE ONE TABLET BY MOUTH DAILY FOR BLOOD PRESSURE (Patient taking differently: Take 1  tablet by mouth daily. TAKE ONE TABLET BY MOUTH DAILY FOR BLOOD PRESSURE)   omeprazole (PRILOSEC) 40 MG capsule Take  1 capsule  Daily  to Prevent Heartburn & Indigestion   OVER THE COUNTER MEDICATION Place 1 spray into the nose daily as needed (congestion). Xylitol (X-Clear)   Pyridoxine HCl (B-6) 100 MG TABS Take 100 mg by mouth daily.   rosuvastatin (CRESTOR) 40 MG tablet TAKE ONE TABLET BY MOUTH DAILY FOR CHOLESTEROL (Patient taking differently: Take 40 mg by mouth at bedtime. TAKE ONE TABLET BY MOUTH DAILY FOR CHOLESTEROL)   vitamin E 200 UNIT capsule Take 200 Units by mouth 2 (two) times daily.   Zinc 50 MG TABS Take 50 mg by mouth daily.     No Known Allergies  SOCIAL HISTORY/FAMILY HISTORY   Reviewed in Epic:  Pertinent findings:  Social History   Tobacco Use   Smoking status: Never   Smokeless tobacco: Never  Vaping Use   Vaping Use: Never used  Substance Use Topics   Alcohol use: Yes    Comment: occasional wine   Drug use: No    Comment: In 30s - cocaine, marijuana   Social History   Social History Narrative   Not on file    OBJCTIVE -PE, EKG, labs   Wt Readings from Last 3 Encounters:  05/10/21 233 lb 3.2 oz (105.8 kg)  03/02/21 230 lb (104.3 kg)  01/21/21 233 lb (105.7 kg)    Physical Exam: BP 138/72   Pulse (!) 53   Ht 6\' 1"  (1.854 m)   Wt 233 lb 3.2 oz (105.8 kg)   SpO2 98%   BMI 30.77 kg/m  Physical Exam Vitals reviewed.  Constitutional:      General: He is not in acute distress.    Appearance: Normal appearance. He is obese. He is  not ill-appearing or toxic-appearing.  HENT:     Head: Normocephalic and atraumatic.  Neck:     Vascular: No carotid bruit, hepatojugular reflux or JVD.  Cardiovascular:     Rate and Rhythm: Regular rhythm. Bradycardia present. No extrasystoles are present.    Chest Wall: PMI is not displaced.     Pulses: Normal pulses.     Heart sounds: Murmur (1/6 SEM at RUSB.) heard.    No friction rub. No gallop.     Comments: Normal S1 with split S2. Pulmonary:     Effort: Pulmonary effort is normal. No respiratory distress.     Breath sounds: Normal breath sounds.  Chest:     Chest wall: No tenderness.  Musculoskeletal:        General: Swelling (Trivial at most ankle edema.) present. Normal range of motion.     Cervical back: Normal range of motion and neck supple.  Skin:    General: Skin is warm and dry.  Neurological:     General: No focal deficit present.     Mental Status: He is oriented to person, place, and time.     Gait: Gait normal.  Psychiatric:        Mood and Affect: Mood normal.        Behavior: Behavior normal.        Thought Content: Thought content normal.        Judgment: Judgment normal.    Adult ECG Report  Rate: 53 ;  Rhythm: sinus bradycardia and RBBB ;   Narrative Interpretation: stable  Recent Labs:  reviewed   Lab Results  Component Value Date   CHOL  161 03/02/2021   HDL 40 03/02/2021   LDLCALC 90 03/02/2021   TRIG 222 (H) 03/02/2021   CHOLHDL 4.0 03/02/2021   Lab Results  Component Value Date   CREATININE 0.86 03/02/2021   BUN 14 03/02/2021   NA 139 03/02/2021   K 3.9 03/02/2021   CL 102 03/02/2021   CO2 28 03/02/2021   CBC Latest Ref Rng & Units 03/02/2021 11/23/2020 06/16/2020  WBC 3.8 - 10.8 Thousand/uL 5.9 7.0 6.5  Hemoglobin 13.2 - 17.1 g/dL 14.1 14.9 14.0  Hematocrit 38.5 - 50.0 % 42.1 44.5 41.5  Platelets 140 - 400 Thousand/uL 305 287 385    Lab Results  Component Value Date   HGBA1C 6.2 (H) 03/02/2021   Lab Results  Component Value  Date   TSH 1.06 03/02/2021    ==================================================  COVID-19 Education: The signs and symptoms of COVID-19 were discussed with the patient and how to seek care for testing (follow up with PCP or arrange E-visit).    I spent a total of 45  minutes with the patient spent in direct patient consultation.  Additional time spent with chart review  / charting (studies, outside notes, etc): 16 min Total Time: 61 min  Current medicines are reviewed at length with the patient today.  (+/- concerns) none  This visit occurred during the SARS-CoV-2 public health emergency.  Safety protocols were in place, including screening questions prior to the visit, additional usage of staff PPE, and extensive cleaning of exam room while observing appropriate contact time as indicated for disinfecting solutions.  Notice: This dictation was prepared with Dragon dictation along with smart phrase technology. Any transcriptional errors that result from this process are unintentional and may not be corrected upon review.  Patient Instructions / Medication Changes & Studies & Tests Ordered   Patient Instructions  Medication Instructions:  No changes      Lab Work:  Not needed   Testing/Procedures:  CT coronary calcium score. This test is done at [address]. This is $99 out of pocket.   Coronary CalciumScan A coronary calcium scan is an imaging test used to look for deposits of calcium and other fatty materials (plaques) in the inner lining of the blood vessels of the heart (coronary arteries). These deposits of calcium and plaques can partly clog and narrow the coronary arteries without producing any symptoms or warning signs. This puts a person at risk for a heart attack. This test can detect these deposits before symptoms develop. Tell a health care provider about: Any allergies you have. All medicines you are taking, including vitamins, herbs, eye  drops, creams, and over-the-counter medicines. Any problems you or family members have had with anesthetic medicines. Any blood disorders you have. Any surgeries you have had. Any medical conditions you have. Whether you are pregnant or may be pregnant. What are the risks? Generally, this is a safe procedure. However, problems may occur, including: Harm to a pregnant woman and her unborn baby. This test involves the use of radiation. Radiation exposure can be dangerous to a pregnant woman and her unborn baby. If you are pregnant, you generally should not have this procedure done. Slight increase in the risk of cancer. This is because of the radiation involved in the test. What happens before the procedure? No preparation is needed for this procedure. What happens during the procedure? You will undress and remove any jewelry around your neck or chest. You will put on a hospital gown. Sticky  electrodes will be placed on your chest. The electrodes will be connected to an electrocardiogram (ECG) machine to record a tracing of the electrical activity of your heart. A CT scanner will take pictures of your heart. During this time, you will be asked to lie still and hold your breath for 2-3 seconds while a picture of your heart is being taken. The procedure may vary among health care providers and hospitals. What happens after the procedure? You can get dressed. You can return to your normal activities. It is up to you to get the results of your test. Ask your health care provider, or the department that is doing the test, when your results will be ready. Summary A coronary calcium scan is an imaging test used to look for deposits of calcium and other fatty materials (plaques) in the inner lining of the blood vessels of the heart (coronary arteries). Generally, this is a safe procedure. Tell your health care provider if you are pregnant or may be pregnant. No preparation is needed for this  procedure. A CT scanner will take pictures of your heart. You can return to your normal activities after the scan is done. This information is not intended to replace advice given to you by your health care provider. Make sure you discuss any questions you have with your health care provider. Document Released: 12/17/2007 Document Revised: 05/09/2016 Document Reviewed: 05/09/2016 Elsevier Interactive Patient Education  2017 [place]: At [facility], you and your health needs are our priority.  As part of our continuing mission to provide you with exceptional heart care, we have created designated Provider Care Teams.  These Care Teams include your primary Cardiologist (physician) and Advanced Practice Providers (APPs -  Physician Assistants and Nurse Practitioners) who all work together to provide you with the care you need, when you need it.     Your next appointment:   12 month(s)  The format for your next appointment:   In Person  Provider:   [name], MD      Studies Ordered:   Orders Placed This Encounter  Procedures   CT CARDIAC SCORING (SELF PAY ONLY)   EKG 12-Lead      [name], M.D., M.S. Interventional Cardiologist   Pager # [phone] Phone # [phone] [address] [place], [place] [address]   Thank you for choosing [facility] at [facility]!!

## 2021-05-15 ENCOUNTER — Encounter: Payer: Self-pay | Admitting: Cardiology

## 2021-05-15 NOTE — Assessment & Plan Note (Signed)
Appropriately on ARB.  Stable renal function

## 2021-05-15 NOTE — Assessment & Plan Note (Signed)
Stable.  Split S2 noted on exam.

## 2021-05-15 NOTE — Assessment & Plan Note (Signed)
His blood pressures seem to be better controlled at home than they are here.  He is on a good dose of beta-blocker and amlodipine as well as ARB And diuretic.  With resting heart rate below 60, unable to titrate carvedilol further.  He is on max dose of Benicar-HCTZ.  I would be reluctant to add additional medication -> Next likely would be spironolactone

## 2021-05-15 NOTE — Assessment & Plan Note (Signed)
.  No sign of AAA on with aortic atherosclerosis, will evaluate coronary disease with coronary calcium score.  Continue to address cardiovascular risk

## 2021-05-15 NOTE — Assessment & Plan Note (Signed)
The patient understands the need to lose weight with diet and exercise. We have discussed specific strategies for this.   - part of Metabolic Syndrome with HTN, & High TG & ~DM-2

## 2021-05-15 NOTE — Assessment & Plan Note (Signed)
Other than elevated triglycerides, his labs do not look that bad.  He is on a pretty deep dose of rosuvastatin.  Triglycerides being elevated is likely related to diabetes.  Plan: Continue statin for now, but low threshold to consider additional therapy. Will check coronary calcium score for Cardiac Risk Stratification

## 2021-05-31 ENCOUNTER — Encounter: Payer: Self-pay | Admitting: Cardiology

## 2021-06-04 ENCOUNTER — Ambulatory Visit
Admission: RE | Admit: 2021-06-04 | Discharge: 2021-06-04 | Disposition: A | Discharge status: Home / Self Care | Payer: Self-pay | Source: Ambulatory Visit | Attending: Cardiology | Admitting: Cardiology

## 2021-06-04 ENCOUNTER — Other Ambulatory Visit: Payer: Self-pay

## 2021-06-04 DIAGNOSIS — I700 Atherosclerosis of aorta: Secondary | ICD-10-CM

## 2021-06-04 DIAGNOSIS — E669 Obesity, unspecified: Secondary | ICD-10-CM

## 2021-06-04 DIAGNOSIS — E785 Hyperlipidemia, unspecified: Secondary | ICD-10-CM

## 2021-06-04 DIAGNOSIS — E1169 Type 2 diabetes mellitus with other specified complication: Secondary | ICD-10-CM

## 2021-06-04 DIAGNOSIS — I4510 Unspecified right bundle-branch block: Secondary | ICD-10-CM

## 2021-06-07 ENCOUNTER — Encounter: Payer: Self-pay | Admitting: Adult Health

## 2021-06-07 ENCOUNTER — Telehealth: Payer: Self-pay | Admitting: *Deleted

## 2021-06-07 DIAGNOSIS — R931 Abnormal findings on diagnostic imaging of heart and coronary circulation: Secondary | ICD-10-CM | POA: Insufficient documentation

## 2021-06-07 DIAGNOSIS — I2510 Atherosclerotic heart disease of native coronary artery without angina pectoris: Secondary | ICD-10-CM | POA: Insufficient documentation

## 2021-06-07 NOTE — Telephone Encounter (Addendum)
The patient has been notified of the result and verbalized understanding.  All questions (if any) were answered. Offered an virtual appointment for 06/18/21 at 10:20 am wit Dr Ellyn Hack .  Patient  aware to continue with Aspirin 81 mg as  medication list indicates.  Patient states he will have to call back to confirm  appointment for 06/18/21  Raiford Simmonds, RN 06/07/2021 4:05 PM

## 2021-06-07 NOTE — Telephone Encounter (Signed)
-----   Message from Leonie Man, MD sent at 06/06/2021 12:55 PM EST ----- Coronary Calcium Score Done for Risk Ratification: Total coronary calcium score was 1036.  This is very high.  There is calcification noted in all 3 major arteries and the left main coronary.  With this level of coronary calcium we need to be very aggressive with lipid control.  With a target LDL less than 70 if not less than 55.  Essentially the same as summary has known documented coronary disease but is obstructed.  Also definitely recommend aspirin 81 mg and we need to be very aggressive with blood pressure/blood sugar  I would like to see Gershon Mussel either in person or potentially virtually to discuss results and potential options of further evaluation.  If he is completely asymptomatic, would simply just be very aggressive with therapies, however if there is concern from some exertional dyspnea I think a more formal ischemic evaluation with either stress test or coronary CTA would be warranted.  [name], MD

## 2021-06-10 ENCOUNTER — Encounter: Payer: Self-pay | Admitting: Cardiology

## 2021-06-13 ENCOUNTER — Other Ambulatory Visit: Payer: Self-pay | Admitting: Internal Medicine

## 2021-06-13 DIAGNOSIS — I700 Atherosclerosis of aorta: Secondary | ICD-10-CM

## 2021-06-13 DIAGNOSIS — E1169 Type 2 diabetes mellitus with other specified complication: Secondary | ICD-10-CM

## 2021-06-13 DIAGNOSIS — E785 Hyperlipidemia, unspecified: Secondary | ICD-10-CM

## 2021-06-13 MED ORDER — ROSUVASTATIN CALCIUM 40 MG PO TABS: ORAL_TABLET | ORAL | 3 refills | Status: DC

## 2021-06-14 ENCOUNTER — Other Ambulatory Visit: Payer: Self-pay | Admitting: Internal Medicine

## 2021-06-14 DIAGNOSIS — I10 Essential (primary) hypertension: Secondary | ICD-10-CM

## 2021-06-16 ENCOUNTER — Encounter: Payer: Self-pay | Admitting: Adult Health

## 2021-06-16 ENCOUNTER — Ambulatory Visit (INDEPENDENT_AMBULATORY_CARE_PROVIDER_SITE_OTHER): Payer: PRIVATE HEALTH INSURANCE | Admitting: Adult Health

## 2021-06-16 ENCOUNTER — Other Ambulatory Visit: Payer: Self-pay

## 2021-06-16 VITALS — BP 124/68 | HR 63 | Temp 97.7°F | Ht 73.0 in | Wt 234.0 lb

## 2021-06-16 DIAGNOSIS — Z1389 Encounter for screening for other disorder: Secondary | ICD-10-CM

## 2021-06-16 DIAGNOSIS — I10 Essential (primary) hypertension: Secondary | ICD-10-CM

## 2021-06-16 DIAGNOSIS — Z0000 Encounter for general adult medical examination without abnormal findings: Secondary | ICD-10-CM

## 2021-06-16 DIAGNOSIS — R931 Abnormal findings on diagnostic imaging of heart and coronary circulation: Secondary | ICD-10-CM

## 2021-06-16 DIAGNOSIS — E559 Vitamin D deficiency, unspecified: Secondary | ICD-10-CM

## 2021-06-16 DIAGNOSIS — E538 Deficiency of other specified B group vitamins: Secondary | ICD-10-CM

## 2021-06-16 DIAGNOSIS — E1122 Type 2 diabetes mellitus with diabetic chronic kidney disease: Secondary | ICD-10-CM

## 2021-06-16 DIAGNOSIS — Z79899 Other long term (current) drug therapy: Secondary | ICD-10-CM

## 2021-06-16 DIAGNOSIS — I4510 Unspecified right bundle-branch block: Secondary | ICD-10-CM

## 2021-06-16 DIAGNOSIS — N521 Erectile dysfunction due to diseases classified elsewhere: Secondary | ICD-10-CM

## 2021-06-16 DIAGNOSIS — E1169 Type 2 diabetes mellitus with other specified complication: Secondary | ICD-10-CM

## 2021-06-16 DIAGNOSIS — E669 Obesity, unspecified: Secondary | ICD-10-CM

## 2021-06-16 DIAGNOSIS — N182 Chronic kidney disease, stage 2 (mild): Secondary | ICD-10-CM

## 2021-06-16 DIAGNOSIS — Z0001 Encounter for general adult medical examination with abnormal findings: Secondary | ICD-10-CM

## 2021-06-16 DIAGNOSIS — Z1329 Encounter for screening for other suspected endocrine disorder: Secondary | ICD-10-CM

## 2021-06-16 DIAGNOSIS — I700 Atherosclerosis of aorta: Secondary | ICD-10-CM

## 2021-06-16 DIAGNOSIS — Z86010 Personal history of colonic polyps: Secondary | ICD-10-CM

## 2021-06-16 MED ORDER — EZETIMIBE 10 MG PO TABS: 10.0000 mg | ORAL_TABLET | Freq: Every day | ORAL | 1 refills | Status: DC

## 2021-06-16 MED ORDER — OZEMPIC (0.25 OR 0.5 MG/DOSE) 2 MG/1.5ML ~~LOC~~ SOPN: PEN_INJECTOR | SUBCUTANEOUS | 0 refills | Status: DC

## 2021-06-16 NOTE — Progress Notes (Signed)
CPE  Assessment and Plan:  Diagnoses and all orders for this visit:  Encounter for Annual Physical Exam with abnormal findings Due annually  Health Maintenance reviewed Healthy lifestyle reviewed and goals set  Atherosclerosis of aorta ([place]) Per imaging, CXR 07/2018 Control blood pressure, cholesterol, glucose, increase exercise.   Coronary calcium score over 400 He is established with cardiology LDL goal at minimum <70, ideally <55  On rosuvastatin 40 mg; discussed repatha vs addition of zetia for goal; he would like to proceed with zetia - script sent, recheck in 3 months.  Take daily ASA 81 mg Reminder to go to the ER if any CP, SOB, nausea, dizziness, severe HA, changes vision/speech, left arm numbness and tingling and jaw pain.  Resistant hypertension Controlled on current medications Monitor blood pressure at home; call if consistently over 130/80 Continue DASH diet.   Reminder to go to the ER if any CP, SOB, nausea, dizziness, severe HA, changes vision/speech, left arm numbness and tingling and jaw pain.  RBBB (right bundle branch block) Monitor, followed by cardiology  Systolic murmur Normal ECHO Followed by cardiology  T2DM [facility])  Education: Reviewed ABCs of diabetes management (respective goals in parentheses):  A1C (<7), blood pressure (<130/80), and cholesterol (LDL <70) Eye Exam yearly and Dental Exam every 6 months.- diabetes eye exam report requested once complete Dietary recommendations Physical Activity recommendations Aggressive control due to high CCC - discussed and sent in ozempic starting dose to try. If tolerating 0.5 mg well contact office for 1 mg/week script - A1C  Hyperlipidemia associated with T2DM ([place]) Continue medications - has been well controlled in past with rosuvastatin  LDL goal <70; reviewed low saturated fat, increase soluble fiber  Continue weight management and exercise.  - Lipid panel  Erectile dysfunction associated with T2DM  (HCC)  Control blood pressure, cholesterol, glucose, increase exercise.  Declines med at this time  CKD II associated with T2DM (HCC) Increase fluids, avoid NSAIDS, monitor sugars, will monitor - CMP/GFR  Obesity- BMI 30  Long discussion about weight loss, diet, and exercise Recommended diet heavy in fruits and veggies and low in animal meats, cheeses, and dairy products, appropriate calorie intake Discussed appropriate weight for height Off of phentermine - limited benefit  Follow up at next visit  Medication management CBC, CMP/GFR, magnesium  Gastroesophageal reflux disease Well managed on current medications Discussed diet, avoiding triggers and other lifestyle changes  Hiatal hernia Small regular meals, PPI indefinitely as recommended by GI  S/p dilation esophageal stricture S/p dilation, continue PPI Denies recurrent sx; he prefers to defer repeat unless symptomatic  Duodenal polyp S/p resection; GI recommended 1 year follow up due 01/2022  Colon polyps Recommended 3 year recall, due 2024  Vitamin D deficiency Continue supplementation Defer vitamin D level  Arthritis/decenerative disk Continue follow up with ortho, PRN gabapentin Discussed cardiac risks with oral nsaids - tylenol, topicals encouraged   Orders Placed This Encounter  Procedures   CBC with Differential/Platelet   COMPLETE METABOLIC PANEL WITH GFR   Magnesium   Lipid panel   TSH   Hemoglobin A1c   VITAMIN D 25 Hydroxy (Vit-D Deficiency, Fractures)   PSA   Microalbumin / creatinine urine ratio   Urinalysis, Routine w reflex microscopic   Vitamin B12   HM DIABETES FOOT EXAM    Discussed med's effects and SE's. Screening labs and tests as requested with regular follow-up as recommended. Over 45 minutes of exam, counseling, chart review, coordination of care and critical decision making  was performed for establishment of new patient with complete physical.   Future Appointments  Date Time  Provider [place]  06/18/2021 10:20 AM Leonie Man, MD [facility] [place]  11/23/2021  3:30 PM Liane Comber, NP [facility] None  06/16/2022 10:00 AM Liane Comber, NP [facility] None    Plan:   During the course of the visit the patient was educated and counseled about appropriate screening and preventive services including:   Pneumococcal vaccine  Prevnar 13 Influenza vaccine Td vaccine Screening electrocardiogram Bone densitometry screening Colorectal cancer screening Diabetes screening Glaucoma screening Nutrition counseling  Advanced directives: requested  HPI BP 124/68    Pulse 63    Temp 97.7 F (36.5 C)    Ht 6\' 1"  (1.854 m)    Wt 234 lb (106.1 kg)    SpO2 99%    BMI 30.87 kg/m   The patient is a very pleasant 70 y.o., presents for CPE. He has Resistant hypertension; Hyperlipidemia associated with type 2 diabetes mellitus ([place]); Degenerative lumbar disc; RBBB (right bundle branch block); Type 2 diabetes mellitus ([place]); Acid reflux; Vitamin D deficiency; Medication management; Erectile dysfunction associated with type 2 diabetes mellitus ([place]); Systolic murmur; Obesity (BMI 30.0-34.9); Atherosclerosis of aorta ([place]); CKD stage 2 due to type 2 diabetes mellitus ([place]); Hiatal hernia; History of adenomatous polyp of colon; Polyp of duodenum; S/P dilatation of esophageal stricture; and Agatston coronary artery calcium score greater than 400 (1036 on 06/04/2021) on their problem list.   He is married. First grandson born November 2022.  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   He follows with [facility] Levy Pupa for multiple ortho complaints. Has had lumbar pain, degenerative disk with intermittent R radicular sx, has had injection with good results, also benefits from PRN gabapentin 300 mg, advil/aleve rarely.    He has a  diagnosis of GERD, He had colonoscopy and EGD by Dr. Hilarie Fredrickson on 11/22/2019 which showed 3 cm hital hernia, low-grade of narrowing schatzki ring (dilated), adenomaous duodenal polyp underwent resection on 01/27/2020 by Dr. Rush Landmark, had another EGD 01/2021, recommended 1 year recall due to adnomatous polyp. Colonoscopy showed several adenomatous polyps recommended for 3 year follow up. Continue omeprazole 40 mg daily, and reports has elevated HOB 2 inches with fully resolved GERD.   BMI is Body mass index is 30.87 kg/m., he has been working on diet and exercise, walking 5-6 miles daily, does weights. Stopped phentermine due to lack of sufficient perceived benefit.  Fat % at home around 18%, notes was losing inches at waist, currently 36.5.  Doing high protein diet,  Reports was down to 225 lb prior to the holidays He reports sleeps well, getting 7+ hours daily, wakes up feeling rested.  Wt Readings from Last 3 Encounters:  06/16/21 234 lb (106.1 kg)  05/10/21 233 lb 3.2 oz (105.8 kg)  03/02/21 230 lb (104.3 kg)   He has atherosclerosis of aorta noted on imaging (CXR 07/2018). He recently had Ct coronary calcium showing 3 vessel CAD with total coronary calcium score of 1036.   His blood pressure has been controlled at home, followed by Dr. Ellyn Hack annually, today their BP is BP: 124/68  He had essentially normal ECHO in 07/2017 obtained for murmur evaluation; attributed to mild septal thickening.   He does workout. He denies chest pain, shortness of breath, dizziness.   He is on  cholesterol medication (rosuvastatin 40 mg daily) and denies myalgias. His cholesterol is not at goal of LDL <70. The cholesterol last visit was:   Lab Results  Component Value Date   CHOL 161 03/02/2021   HDL 40 03/02/2021   LDLCALC 90 03/02/2021   TRIG 222 (H) 03/02/2021   CHOLHDL 4.0 03/02/2021    He has been working on diet and exercise for T2DM managed by lifestyle (6.5% 04/2018, 6.6% 02/2019), he is on ASA, ACEi,  statin, metformin 500 mg BID and denies foot ulcerations, increased appetite, nausea, paresthesia of the feet, polydipsia, polyuria and visual disturbances. He does have erectile dysfunction, but not recently active, has declined sildenafil. Doesn't have glucometer, has always been well controlled.  Last A1C in the office was:  Lab Results  Component Value Date   HGBA1C 6.2 (H) 03/02/2021    He has stable CKD II associated with T2DM monitored via this office:  Lab Results  Component Value Date   GFRNONAA 89 11/23/2020   Patient is on Vitamin D supplement, ? 4000 IU daily Lab Results  Component Value Date   VD25OH 69 06/16/2020     He denies LUTS or nocturia.  Lab Results  Component Value Date   PSA 1.19 06/16/2020   PSA 1.6 05/20/2019   PSA 1.3 04/16/2018    No results found for: IRON, TIBC, FERRITIN No results found for: VITAMINB12 - he reports started on oral supplement with perceived memory improvement.    Current Medications:  Current Outpatient Medications on File Prior to Visit  Medication Sig Dispense Refill   amLODipine (NORVASC) 10 MG tablet TAKE ONE TABLET BY MOUTH DAILY FOR BLOOD PRESSURE 90 tablet 0   Ascorbic Acid (VITAMIN C) 1000 MG tablet Take 1,000 mg by mouth daily.     aspirin 81 MG tablet Take 81 mg by mouth daily.     BIOTIN PO Take 1 tablet by mouth daily.     carvedilol (COREG) 12.5 MG tablet TAKE ONE TABLET BY MOUTH TWICE A DAY (Patient taking differently: Take 12.5 mg by mouth at bedtime.) 180 tablet 3   Magnesium 400 MG TABS Take 400 mg by mouth daily.     olmesartan-hydrochlorothiazide (BENICAR HCT) 40-25 MG tablet TAKE ONE TABLET BY MOUTH DAILY FOR BLOOD PRESSURE (Patient taking differently: Take 1 tablet by mouth daily. TAKE ONE TABLET BY MOUTH DAILY FOR BLOOD PRESSURE) 90 tablet 3   omeprazole (PRILOSEC) 40 MG capsule Take  1 capsule  Daily  to Prevent Heartburn & Indigestion 90 capsule 3   OVER THE COUNTER MEDICATION Place 1 spray into the nose  daily as needed (congestion). Xylitol (X-Clear)     rosuvastatin (CRESTOR) 40 MG tablet Take  1 tablet  Daily  for Cholesterol                                                         /                                 TAKE ONE TABLET BY MOUTH 90 tablet 3   Thiamine HCl (VITAMIN B-1) 250 MG tablet Take 250 mg by mouth daily.     vitamin E 200 UNIT capsule Take 200 Units by mouth 2 (two) times  daily.     Zinc 50 MG TABS Take 50 mg by mouth daily.      Pyridoxine HCl (B-6) 100 MG TABS Take 100 mg by mouth daily. (Patient not taking: Reported on 06/16/2021)     No current facility-administered medications on file prior to visit.   Allergies:  No Known Allergies Health Maintenance:  Immunization History  Administered Date(s) Administered   Influenza Split 08/28/2007   Pneumococcal Conjugate-13 04/16/2018   Pneumococcal Polysaccharide-23 05/20/2019   Tdap 11/19/2010    Tetanus: 2014 with laceration Pneumovax: 2020 Prevnar 13: 2019 Flu vaccine: declines Shingrix: declines Covid 19: declines  ECHO: 07/2017 normal   Colonoscopy: 01/2020 - Dr. Hilarie Fredrickson, 3 year recall  EGD: 01/2021 - adenomatous duodenal polyp, 1 year recall, Dr. Allison Quarry  Eye Exam: Dr. [facility], 07/28/2020 no retinopathy  Dentist: Dr. Arnell Sieving, [place], last visit 2022, goes 58m  Patient Care Team: Unk Pinto, MD as PCP - General (Internal Medicine) Leonie Man, MD as PCP - Cardiology (Cardiology) Mansouraty, Telford Nab., MD as Consulting Physician (Gastroenterology)  Medical History:  has Resistant hypertension; Hyperlipidemia associated with type 2 diabetes mellitus ([place]); Degenerative lumbar disc; RBBB (right bundle branch block); Type 2 diabetes mellitus ([place]); Acid reflux; Vitamin D deficiency; Medication management; Erectile dysfunction associated with type 2 diabetes mellitus ([place]); Systolic murmur; Obesity (BMI 30.0-34.9); Atherosclerosis of aorta ([place]); CKD stage 2 due to type 2 diabetes mellitus  ([place]); Hiatal hernia; History of adenomatous polyp of colon; Polyp of duodenum; S/P dilatation of esophageal stricture; and Agatston coronary artery calcium score greater than 400 (1036 on 06/04/2021) on their problem list. Surgical History:  He  has a past surgical history that includes Cardiac catheterization (2005); Abdominal surgery (1956); Stapedectomy (Bilateral); Trigger finger release (Right, 2014); Cyst removal hand (Left, 1975); Skin lesion excision; transthoracic echocardiogram (07/2017); Colonoscopy; Upper gi endoscopy; Wisdom tooth extraction; Endoscopic mucosal resection (N/A, 01/27/2020); biopsy (01/27/2020); Submucosal lifting injection (01/27/2020); Submucosal tattoo injection (01/27/2020); Hemostasis clip placement (01/27/2020); enteroscopy (N/A, 01/27/2020); Carpal tunnel release (Left, 03/06/2020); Cochlear implant (Bilateral); Esophagogastroduodenoscopy (egd) with propofol (N/A, 01/21/2021); and biopsy (01/21/2021). Family History:  His family history includes Arrhythmia in his brother; COPD in his mother; Heart attack in his brother; Heart disease in his mother; Heart failure in his mother; Hyperlipidemia in his brother and brother; Hypertension in his brother and brother; Kidney disease in his father. Social History:   reports that he has never smoked. He has never used smokeless tobacco. He reports current alcohol use. He reports that he does not use drugs.     Review of Systems:  Review of Systems  Constitutional:  Negative for malaise/fatigue and weight loss.  HENT:  Negative for hearing loss and tinnitus.   Eyes:  Negative for blurred vision and double vision.  Respiratory:  Negative for cough, sputum production, shortness of breath and wheezing.   Cardiovascular:  Negative for chest pain, palpitations, orthopnea, claudication, leg swelling and PND.  Gastrointestinal:  Negative for abdominal pain, blood in stool, constipation, diarrhea, heartburn, melena, nausea and vomiting.   Genitourinary: Negative.   Musculoskeletal:  Negative for falls, joint pain and myalgias.  Skin:  Negative for rash.  Neurological:  Negative for dizziness, tingling, sensory change, weakness and headaches.  Endo/Heme/Allergies:  Negative for polydipsia.  Psychiatric/Behavioral: Negative.  Negative for depression, memory loss, substance abuse and suicidal ideas. The patient is not nervous/anxious and does not have insomnia.   All other systems reviewed and are negative.  Physical Exam: Estimated body  mass index is 30.87 kg/m as calculated from the following:   Height as of this encounter: 6\' 1"  (1.854 m).   Weight as of this encounter: 234 lb (106.1 kg). BP 124/68    Pulse 63    Temp 97.7 F (36.5 C)    Ht 6\' 1"  (1.854 m)    Wt 234 lb (106.1 kg)    SpO2 99%    BMI 30.87 kg/m    General Appearance: Well nourished, in no apparent distress.  Eyes: PERRLA, EOMs, conjunctiva no swelling or erythema, fundal exam deferred to ophth Sinuses: No Frontal/maxillary tenderness  ENT/Mouth: Ext aud canals clear, normal light reflex with TMs without erythema, bulging. Good dentition. No erythema, swelling, or exudate on post pharynx. Tonsils not swollen or erythematous. Hearing normal.  Neck: Supple, thyroid normal. No bruits  Respiratory: Respiratory effort normal, BS equal bilaterally without rales, rhonchi, wheezing or stridor.  Cardio: RR, bradycardic, without murmurs, rubs or gallops. Brisk peripheral pulses without edema.  Chest: symmetric, with normal excursions and percussion.  Abdomen: Soft, nontender, no guarding, rebound, hernias, masses, or organomegaly.  Lymphatics: Non tender without lymphadenopathy.  Genitourinary: Declines, no concerns Musculoskeletal: Full ROM all peripheral extremities,5/5 strength, and normal gait. Bony growth/deformity noted to superior aspect of bil MCP joint of 1st digit, ROM intact, without signs of acute inflammation.  Skin: Warm, dry without rashes,  ecchymosis. Multiple nevi to back; benign appearing, all <6 mm  Neuro: Cranial nerves intact, reflexes equal bilaterally. Normal muscle tone, no cerebellar symptoms. Sensation intact.  Psych: Awake and oriented X 3, normal affect, Insight and Judgment appropriate.   EKG: had recently 05/10/2021 by Dr. Ellyn Hack - defer   Izora Ribas, NP 12:56 PM [facility]

## 2021-06-16 NOTE — Patient Instructions (Addendum)
[name] , Thank you for taking time to come for your Annual Wellness Visit. I appreciate your ongoing commitment to your health goals. Please review the following plan we discussed and let me know if I can assist you in the future.   These are the goals we discussed:  Goals      Blood Pressure < 130/80     DIET - INCREASE WATER INTAKE     Recommend 65-80 fluid ounces daily      Exercise 150 min/wk Moderate Activity     LDL CALC < 70     Ideally <55     Weight (lb) < 220 lb (99.8 kg)        This is a list of the screening recommended for you and due dates:  Health Maintenance  Topic Date Due   COVID-19 Vaccine (1) 07/02/2021*   Zoster (Shingles) Vaccine (1 of 2) 09/14/2021*   Eye exam for diabetics  07/28/2021   Hemoglobin A1C  08/31/2021   Complete foot exam   06/16/2022   Tetanus Vaccine  07/04/2022   Colon Cancer Screening  11/22/2022   Pneumonia Vaccine  Completed   Hepatitis C Screening: USPSTF Recommendation to screen - Ages 18-79 yo.  Completed   HPV Vaccine  Aged Out   Flu Shot  Discontinued  *Topic was postponed. The date shown is not the original due date.      Semaglutide Injection What is this medication? SEMAGLUTIDE (SEM a GLOO tide) treats type 2 diabetes. It works by increasing insulin levels in your body, which decreases your blood sugar (glucose). It also reduces the amount of sugar released into the blood and slows down your digestion. It can also be used to lower the risk of heart attack and stroke in people with type 2 diabetes. Changes to diet and exercise are often combined with this medication. This medicine may be used for other purposes; ask your health care provider or pharmacist if you have questions. COMMON BRAND NAME(S): OZEMPIC What should I tell my care team before I take this medication? They need to know if you have any of these conditions: Endocrine tumors (MEN 2) or if someone in your family had these tumors Eye disease, vision  problems History of pancreatitis Kidney disease Stomach problems Thyroid cancer or if someone in your family had thyroid cancer An unusual or allergic reaction to semaglutide, other medications, foods, dyes, or preservatives Pregnant or trying to get pregnant Breast-feeding How should I use this medication? This medication is for injection under the skin of your upper leg (thigh), stomach area, or upper arm. It is given once every week (every 7 days). You will be taught how to prepare and give this medication. Use exactly as directed. Take your medication at regular intervals. Do not take it more often than directed. If you use this medication with insulin, you should inject this medication and the insulin separately. Do not mix them together. Do not give the injections right next to each other. Change (rotate) injection sites with each injection. It is important that you put your used needles and syringes in a special sharps container. Do not put them in a trash can. If you do not have a sharps container, call your pharmacist or care team to get one. A special MedGuide will be given to you by the pharmacist with each prescription and refill. Be sure to read this information carefully each time. This medication comes with INSTRUCTIONS FOR USE. Ask your pharmacist for  directions on how to use this medication. Read the information carefully. Talk to your pharmacist or care team if you have questions. Talk to your care team about the use of this medication in children. Special care may be needed. Overdosage: If you think you have taken too much of this medicine contact a poison control center or emergency room at once. NOTE: This medicine is only for you. Do not share this medicine with others. What if I miss a dose? If you miss a dose, take it as soon as you can within 5 days after the missed dose. Then take your next dose at your regular weekly time. If it has been longer than 5 days after the  missed dose, do not take the missed dose. Take the next dose at your regular time. Do not take double or extra doses. If you have questions about a missed dose, contact your care team for advice. What may interact with this medication? Other medications for diabetes Many medications may cause changes in blood sugar, these include: Alcohol containing beverages Antiviral medications for HIV or AIDS Aspirin and aspirin-like medications Certain medications for blood pressure, heart disease, irregular heart beat Chromium Diuretics Male hormones, such as estrogens or progestins, birth control pills Fenofibrate Gemfibrozil Isoniazid Lanreotide Male hormones or anabolic steroids MAOIs like Carbex, Eldepryl, Marplan, Nardil, and Parnate Medications for weight loss Medications for allergies, asthma, cold, or cough Medications for depression, anxiety, or psychotic disturbances Niacin Nicotine NSAIDs, medications for pain and inflammation, like ibuprofen or naproxen Octreotide Pasireotide Pentamidine Phenytoin Probenecid Quinolone antibiotics such as ciprofloxacin, levofloxacin, ofloxacin Some herbal dietary supplements Steroid medications such as prednisone or cortisone Sulfamethoxazole; trimethoprim Thyroid hormones Some medications can hide the warning symptoms of low blood sugar (hypoglycemia). You may need to monitor your blood sugar more closely if you are taking one of these medications. These include: Beta-blockers, often used for high blood pressure or heart problems (examples include atenolol, metoprolol, propranolol) Clonidine Guanethidine Reserpine This list may not describe all possible interactions. Give your health care provider a list of all the medicines, herbs, non-prescription drugs, or dietary supplements you use. Also tell them if you smoke, drink alcohol, or use illegal drugs. Some items may interact with your medicine. What should I watch for while using this  medication? Visit your care team for regular checks on your progress. Drink plenty of fluids while taking this medication. Check with your care team if you get an attack of severe diarrhea, nausea, and vomiting. The loss of too much body fluid can make it dangerous for you to take this medication. A test called the HbA1C (A1C) will be monitored. This is a simple blood test. It measures your blood sugar control over the last 2 to 3 months. You will receive this test every 3 to 6 months. Learn how to check your blood sugar. Learn the symptoms of low and high blood sugar and how to manage them. Always carry a quick-source of sugar with you in case you have symptoms of low blood sugar. Examples include hard sugar candy or glucose tablets. Make sure others know that you can choke if you eat or drink when you develop serious symptoms of low blood sugar, such as seizures or unconsciousness. They must get medical help at once. Tell your care team if you have high blood sugar. You might need to change the dose of your medication. If you are sick or exercising more than usual, you might need to change the dose of  your medication. Do not skip meals. Ask your care team if you should avoid alcohol. Many nonprescription cough and cold products contain sugar or alcohol. These can affect blood sugar. Pens should never be shared. Even if the needle is changed, sharing may result in passing of viruses like hepatitis or HIV. Wear a medical ID bracelet or chain, and carry a card that describes your disease and details of your medication and dosage times. Do not become pregnant while taking this medication. Women should inform their care team if they wish to become pregnant or think they might be pregnant. There is a potential for serious side effects to an unborn child. Talk to your care team for more information. What side effects may I notice from receiving this medication? Side effects that you should report to your care  team as soon as possible: Allergic reactions--skin rash, itching, hives, swelling of the face, lips, tongue, or throat Change in vision Dehydration--increased thirst, dry mouth, feeling faint or lightheaded, headache, dark yellow or brown urine Gallbladder problems--severe stomach pain, nausea, vomiting, fever Heart palpitations--rapid, pounding, or irregular heartbeat Kidney injury--decrease in the amount of urine, swelling of the ankles, hands, or feet Pancreatitis--severe stomach pain that spreads to your back or gets worse after eating or when touched, fever, nausea, vomiting Thyroid cancer--new mass or lump in the neck, pain or trouble swallowing, trouble breathing, hoarseness Side effects that usually do not require medical attention (report to your care team if they continue or are bothersome): Diarrhea Loss of appetite Nausea Stomach pain Vomiting This list may not describe all possible side effects. Call your doctor for medical advice about side effects. You may report side effects to FDA at 1-800-FDA-1088. Where should I keep my medication? Keep out of the reach of children. Store unopened pens in a refrigerator between 2 and 8 degrees C (36 and 46 degrees F). Do not freeze. Protect from light and heat. After you first use the pen, it can be stored for 56 days at room temperature between 15 and 30 degrees C (59 and 86 degrees F) or in a refrigerator. Throw away your used pen after 56 days or after the expiration date, whichever comes first. Do not store your pen with the needle attached. If the needle is left on, medication may leak from the pen. NOTE: This sheet is a summary. It may not cover all possible information. If you have questions about this medicine, talk to your doctor, pharmacist, or health care provider.  2022 Elsevier/Gold Standard (2020-09-24 00:00:00) Ezetimibe Tablets What is this medication? EZETIMIBE (ez ET i mibe) treats high cholesterol. It works by reducing  the amount of cholesterol absorbed from the food you eat. This decreases the amount of bad cholesterol (such as LDL) in your blood. Changes to diet and exercise are often combined with this medication. This medicine may be used for other purposes; ask your health care provider or pharmacist if you have questions. COMMON BRAND NAME(S): Zetia What should I tell my care team before I take this medication? They need to know if you have any of these conditions: Kidney disease Liver disease Muscle cramps, pain Muscle injury Thyroid disease An unusual or allergic reaction to ezetimibe, other medications, foods, dyes, or preservatives Pregnant or trying to get pregnant Breast-feeding How should I use this medication? Take this medication by mouth. Take it as directed on the prescription label at the same time every day. You can take it with or without food. If it upsets your  stomach, take it with food. Keep taking it unless your care team tells you to stop. Take bile acid sequestrants at a different time of day than this medication. Take this medication 2 hours BEFORE or 4 hours AFTER bile acid sequestrants. Talk to your care team about the use of this medication in children. While it may be prescribed for children as young as 10 for selected conditions, precautions do apply. Overdosage: If you think you have taken too much of this medicine contact a poison control center or emergency room at once. NOTE: This medicine is only for you. Do not share this medicine with others. What if I miss a dose? If you miss a dose, take it as soon as you can. If it is almost time for your next dose, take only that dose. Do not take double or extra doses. What may interact with this medication? Do not take this medication with any of the following: Fenofibrate Gemfibrozil This medication may also interact with the following: Antacids Cyclosporine Herbal medications like red yeast rice Other medications to lower  cholesterol or triglycerides This list may not describe all possible interactions. Give your health care provider a list of all the medicines, herbs, non-prescription drugs, or dietary supplements you use. Also tell them if you smoke, drink alcohol, or use illegal drugs. Some items may interact with your medicine. What should I watch for while using this medication? Visit your care team for regular checks on your progress. Tell your care team if your symptoms do not start to get better or if they get worse. Your care team may tell you to stop taking this medication if you develop muscle problems. If your muscle problems do not go away after stopping this medication, contact your care team. Do not become pregnant while taking this medication. Women should inform their care team if they wish to become pregnant or think they might be pregnant. There is potential for serious harm to an unborn child. Talk to your care team for more information. Do not breast-feed an infant while taking this medication. Taking this medication is only part of a total heart healthy program. Your care team may give you a special diet to follow. Avoid alcohol. Avoid smoking. Ask your care team how much you should exercise. What side effects may I notice from receiving this medication? Side effects that you should report to your doctor or health care provider as soon as possible: Allergic reactions--skin rash, itching or hives, swelling of the face, lips, tongue, or throat Side effects that usually do not require medical attention (report to your doctor or health care provider if they continue or are bothersome): Diarrhea Joint pain This list may not describe all possible side effects. Call your doctor for medical advice about side effects. You may report side effects to FDA at 1-800-FDA-1088. Where should I keep my medication? Keep out of the reach of children and pets. Store at room temperature between 15 and 30 degrees C (59  and 86 degrees F). Protect from moisture. Get rid of any unused medication after the expiration date. NOTE: This sheet is a summary. It may not cover all possible information. If you have questions about this medicine, talk to your doctor, pharmacist, or health care provider.  2022 Elsevier/Gold Standard (2020-07-17 00:00:00)    Diastasis Recti Diastasis recti is a condition in which the muscles of the abdomen (rectus abdominis muscles) become thin and separate. The result is a wider space between the muscles of the  right and left abdomen (abdominal muscles). This wider space between the muscles may cause a bulge in the middle of the abdomen. This bulge may be noticed when a person is straining or when he or she sits up after lying down. Diastasis recti can affect men and women. It is most common among pregnant women, babies, people with obesity, and people who have had abdominal surgery. Exercise or surgery may help correct this condition. What are the causes? Common causes of this condition include: Pregnancy. As the uterus grows in size, it puts pressure on the abdominal muscles, causing the muscles to separate. Obesity. Excess fat puts pressure on abdominal muscles. Weight lifting. Some exercises of the abdomen. Advanced age. Genetics. Having had surgery on the abdomen before. What increases the risk? This condition is more likely to develop in: Women. Newborns, especially newborns who are born early (prematurely). What are the signs or symptoms? Common symptoms of this condition include: A bulge in the middle of your abdomen. You will notice it most when you sit up or strain. Pain in your low back, hips, or the area between your hip bones (pelvis). Constipation. Being unable to control when you urinate (urinary incontinence). Bloating. Poor posture. How is this diagnosed? This condition is diagnosed with a physical exam. During the exam, your health care provider will ask you to  lie flat on your back and do a crunch or half sit-up. If you have diastasis recti, a bulge will appear lengthwise between your abdominal muscles in the center of your abdomen. Your health care provider will measure the gap between your muscles with one of the following: A medical device used to measure the space between two objects (caliper). A tape measure. CT scan. Ultrasound. Finger spaces. Your health care provider will measure the space using his or her fingers. How is this treated? If your muscle separation is not too large, you may not need treatment. However, if you are a woman who plans to become pregnant again, you should treat this condition before your next pregnancy. Treatment may include: Physical therapy exercises to strengthen and tighten your abdominal muscles. Lifestyle changes such as weight loss and exercise. Over-the-counter pain medicines as needed. Surgery to correct the separation. Follow these instructions at home: Activity Return to your normal activities as told by your health care provider. Ask your health care provider what activities are safe for you. Do exercises as told by your health care provider. Make sure you are doing your exercises and movements correctly when lifting weights or doing exercises using your abdominal muscles or the muscles in the center of your body that give stability (core muscles). Proper form can help to prevent this condition from happening again. General instructions If you are overweight, ask your health care provider for help with weight loss. Losing even a small amount of weight can help to improve your diastasis recti. Take over-the-counter or prescription medicines only as told by your health care provider. Do not strain. Straining can make the separation worse. Examples of straining include: Pushing hard to have a bowel movement, such as when you have constipation. Lifting heavy objects or lifting children. Standing up and sitting  down. You may need to take these actions to prevent or treat constipation: Drink enough fluid to keep your urine pale yellow. Take over-the-counter or prescription medicines. Eat foods that are high in fiber, such as beans, whole grains, and fresh fruits and vegetables. Limit foods that are high in fat and processed sugars, such  as fried or sweet foods. Keep all follow-up visits. This is important. Contact a health care provider if: You notice a new bulge in your abdomen. Get help right away if: You experience severe discomfort in your abdomen. You develop severe abdominal pain along with nausea, vomiting, or a fever. Summary Diastasis recti is a condition in which the muscles of the abdomen (rectus abdominismuscles) become thin and separate. You may notice a bulge in your abdomen because the space has widened between the muscles of the right and left abdomen. The most common symptom is a bulge in the middle of your abdomen. You will notice it most when you sit up or strain. This condition is diagnosed with a physical exam. If the muscle separation is not too big, you may not need treatment. Otherwise, you may need to do physical therapy or have surgery. This information is not intended to replace advice given to you by your health care provider. Make sure you discuss any questions you have with your health care provider. Document Revised: 02/21/2020 Document Reviewed: 02/21/2020 Elsevier Patient Education  [place].

## 2021-06-17 LAB — HEMOGLOBIN A1C
Hgb A1c MFr Bld: 6.4 % of total Hgb — ABNORMAL HIGH (ref <5.7)
Mean Plasma Glucose: 137 mg/dL
eAG (mmol/L): 7.6 mmol/L

## 2021-06-17 LAB — LIPID PANEL
Cholesterol: 163 mg/dL (ref <200)
HDL: 42 mg/dL (ref >=40)
LDL Cholesterol (Calc): 98 mg/dL (calc)
Non-HDL Cholesterol (Calc): 121 mg/dL (calc) (ref <130)
Total CHOL/HDL Ratio: 3.9 (calc) (ref <5.0)
Triglycerides: 126 mg/dL (ref <150)

## 2021-06-17 LAB — CBC WITH DIFFERENTIAL/PLATELET
Absolute Monocytes: 696 cells/uL (ref 200–950)
Basophils Absolute: 82 cells/uL (ref 0–200)
Basophils Relative: 1.7 %
Eosinophils Absolute: 408 cells/uL (ref 15–500)
Eosinophils Relative: 8.5 %
HCT: 42.9 % (ref 38.5–50.0)
Hemoglobin: 14.4 g/dL (ref 13.2–17.1)
Lymphs Abs: 1402 cells/uL (ref 850–3900)
MCH: 30.1 pg (ref 27.0–33.0)
MCHC: 33.6 g/dL (ref 32.0–36.0)
MCV: 89.6 fL (ref 80.0–100.0)
MPV: 9.8 fL (ref 7.5–12.5)
Monocytes Relative: 14.5 %
Neutro Abs: 2213 cells/uL (ref 1500–7800)
Neutrophils Relative %: 46.1 %
Platelets: 276 10*3/uL (ref 140–400)
RBC: 4.79 10*6/uL (ref 4.20–5.80)
RDW: 13.1 % (ref 11.0–15.0)
Total Lymphocyte: 29.2 %
WBC: 4.8 10*3/uL (ref 3.8–10.8)

## 2021-06-17 LAB — PSA: PSA: 1.15 ng/mL (ref <=4.00)

## 2021-06-17 LAB — COMPLETE METABOLIC PANEL WITH GFR
AG Ratio: 1.9 (calc) (ref 1.0–2.5)
ALT: 28 U/L (ref 9–46)
AST: 27 U/L (ref 10–35)
Albumin: 4.8 g/dL (ref 3.6–5.1)
Alkaline phosphatase (APISO): 40 U/L (ref 35–144)
BUN: 17 mg/dL (ref 7–25)
CO2: 30 mmol/L (ref 20–32)
Calcium: 10.6 mg/dL — ABNORMAL HIGH (ref 8.6–10.3)
Chloride: 102 mmol/L (ref 98–110)
Creat: 0.88 mg/dL (ref 0.70–1.35)
Globulin: 2.5 g/dL (calc) (ref 1.9–3.7)
Glucose, Bld: 99 mg/dL (ref 65–99)
Potassium: 4.3 mmol/L (ref 3.5–5.3)
Sodium: 140 mmol/L (ref 135–146)
Total Bilirubin: 0.7 mg/dL (ref 0.2–1.2)
Total Protein: 7.3 g/dL (ref 6.1–8.1)
eGFR: 94 mL/min/{1.73_m2} (ref >=60)

## 2021-06-17 LAB — URINALYSIS, ROUTINE W REFLEX MICROSCOPIC
Bilirubin Urine: NEGATIVE
Glucose, UA: NEGATIVE
Hgb urine dipstick: NEGATIVE
Ketones, ur: NEGATIVE
Leukocytes,Ua: NEGATIVE
Nitrite: NEGATIVE
Protein, ur: NEGATIVE
Specific Gravity, Urine: 1.011 (ref 1.001–1.035)
pH: 5.5 (ref 5.0–8.0)

## 2021-06-17 LAB — MICROALBUMIN / CREATININE URINE RATIO
Creatinine, Urine: 65 mg/dL (ref 20–320)
Microalb Creat Ratio: 9 mcg/mg creat (ref <30)
Microalb, Ur: 0.6 mg/dL

## 2021-06-17 LAB — TSH: TSH: 3.54 mIU/L (ref 0.40–4.50)

## 2021-06-17 LAB — VITAMIN B12: Vitamin B-12: 773 pg/mL (ref 200–1100)

## 2021-06-17 LAB — VITAMIN D 25 HYDROXY (VIT D DEFICIENCY, FRACTURES): Vit D, 25-Hydroxy: 45 ng/mL (ref 30–100)

## 2021-06-17 LAB — MAGNESIUM: Magnesium: 2.2 mg/dL (ref 1.5–2.5)

## 2021-06-18 ENCOUNTER — Telehealth (INDEPENDENT_AMBULATORY_CARE_PROVIDER_SITE_OTHER): Payer: Medicare Other | Admitting: Cardiology

## 2021-06-18 ENCOUNTER — Encounter: Payer: Self-pay | Admitting: Cardiology

## 2021-06-18 ENCOUNTER — Other Ambulatory Visit: Payer: Self-pay

## 2021-06-18 ENCOUNTER — Telehealth: Payer: Self-pay | Admitting: *Deleted

## 2021-06-18 VITALS — HR 61 | Ht 73.0 in | Wt 228.0 lb

## 2021-06-18 DIAGNOSIS — I10 Essential (primary) hypertension: Secondary | ICD-10-CM | POA: Diagnosis not present

## 2021-06-18 DIAGNOSIS — E8881 Metabolic syndrome: Secondary | ICD-10-CM | POA: Diagnosis not present

## 2021-06-18 DIAGNOSIS — E1169 Type 2 diabetes mellitus with other specified complication: Secondary | ICD-10-CM

## 2021-06-18 DIAGNOSIS — R931 Abnormal findings on diagnostic imaging of heart and coronary circulation: Secondary | ICD-10-CM

## 2021-06-18 DIAGNOSIS — E785 Hyperlipidemia, unspecified: Secondary | ICD-10-CM

## 2021-06-18 DIAGNOSIS — R9431 Abnormal electrocardiogram [ECG] [EKG]: Secondary | ICD-10-CM

## 2021-06-18 NOTE — Assessment & Plan Note (Signed)
He has combination of obesity, hypertension and diabetes meets the criteria for metabolic syndrome, this in commendation with dyslipidemia and significant family history of premature CAD.  High risk of CAD confirmed by Coronary Calcium Score-Further Risk Stratification with Coronary CTA-CT FFR. Nathaniel Gill and I would imagine based on his lack of symptoms and increased level activity, but he would not have obstructive disease.  However this would further quantify the extent of CAD in order for [place] determine how aggressive we need to be with LDL target of 70 versus 55.

## 2021-06-18 NOTE — Assessment & Plan Note (Addendum)
Pretty significantly elevated coronary calcium score for somebody was asymptomatic.  He clearly has risk factors with hypertension hyperlipidemia, and multiple family members having premature CAD.  We talked about further risk stratification I think the best option will be to proceed with coronary CTA with possible FFRCT.  Also need to be more aggressive with lipid and blood pressure management.  Plan:  Coronary CT angiogram with CT FFR.  Continue Zetia that was just added along with rosuvastatin.  We will need to have labs rechecked in about 3 months to reassess.  Otherwise would probably consider referral for PCSK9 inhibitor  He is currently taking carvedilol, albeit not correctly.  We will have him switch to taking 1/2 tablet twice daily, and reassess blood pressure.  He is also on Benicar HCT and amlodipine at max dose.  Clearly recommend aspirin based on this level Coronary Calcium Score.

## 2021-06-18 NOTE — Telephone Encounter (Signed)
RN spoke to patient. Instruction were given  from today's virtual visit 06/18/21 .  AVS SUMMARY has been sent by mychart . AVS also mailed with instructions for CCTA.   Follow up appointment schedule 08/18/20   Patient verbalized understanding

## 2021-06-18 NOTE — Assessment & Plan Note (Addendum)
Most recent panel showed LDL of 98.  With this level Coronary Calcium Score, target should be LDL less than 70 and a minimum, but would prefer less than 55.  Presence of significant amount of coronary calcium would suggest more underlying noncalcified disease.  Plan: Recommend reevaluating lipids in 3 to 4 months after starting Zetia.  If not yet at goal, will consider referral to lipid clinic to potentially initiate PCSK9 inhibitor versus inclisiran.  A1c was 6.4.  Started on Ozempic by PCP.

## 2021-06-18 NOTE — Telephone Encounter (Signed)
°  Patient Consent for Virtual Visit        Nathaniel Gill has provided verbal consent on 06/18/2021 for a virtual visit (video or telephone).   CONSENT FOR VIRTUAL VISIT FOR:  Nathaniel Gill  By participating in this virtual visit I agree to the following:  I hereby voluntarily request, consent and authorize [facility] and its employed or contracted physicians, physician assistants, nurse practitioners or other licensed health care professionals (the Practitioner), to provide me with telemedicine health care services (the Services") as deemed necessary by the treating Practitioner. I acknowledge and consent to receive the Services by the Practitioner via telemedicine. I understand that the telemedicine visit will involve communicating with the Practitioner through live audiovisual communication technology and the disclosure of certain medical information by electronic transmission. I acknowledge that I have been given the opportunity to request an in-person assessment or other available alternative prior to the telemedicine visit and am voluntarily participating in the telemedicine visit.  I understand that I have the right to withhold or withdraw my consent to the use of telemedicine in the course of my care at any time, without affecting my right to future care or treatment, and that the Practitioner or I may terminate the telemedicine visit at any time. I understand that I have the right to inspect all information obtained and/or recorded in the course of the telemedicine visit and may receive copies of available information for a reasonable fee.  I understand that some of the potential risks of receiving the Services via telemedicine include:  Delay or interruption in medical evaluation due to technological equipment failure or disruption; Information transmitted may not be sufficient (e.g. poor resolution of images) to allow for appropriate medical decision making by the  Practitioner; and/or  In rare instances, security protocols could fail, causing a breach of personal health information.  Furthermore, I acknowledge that it is my responsibility to provide information about my medical history, conditions and care that is complete and accurate to the best of my ability. I acknowledge that Practitioner's advice, recommendations, and/or decision may be based on factors not within their control, such as incomplete or inaccurate data provided by me or distortions of diagnostic images or specimens that may result from electronic transmissions. I understand that the practice of medicine is not an exact science and that Practitioner makes no warranties or guarantees regarding treatment outcomes. I acknowledge that a copy of this consent can be made available to me via my patient portal ([place]), or I can request a printed copy by calling the office of [facility].    I understand that my insurance will be billed for this visit.   I have read or had this consent read to me. I understand the contents of this consent, which adequately explains the benefits and risks of the Services being provided via telemedicine.  I have been provided ample opportunity to ask questions regarding this consent and the Services and have had my questions answered to my satisfaction. I give my informed consent for the services to be provided through the use of telemedicine in my medical care

## 2021-06-18 NOTE — Progress Notes (Signed)
Virtual Visit via Telephone Note   This visit type was conducted due to national recommendations for restrictions regarding the COVID-19 Pandemic (e.g. social distancing) in an effort to limit this patient's exposure and mitigate transmission in our community.  Due to his co-morbid illnesses, this patient is at least at moderate risk for complications without adequate follow up.  This format is felt to be most appropriate for this patient at this time.  The patient did not have access to video technology/had technical difficulties with video requiring transitioning to audio format only (telephone).  All issues noted in this document were discussed and addressed.  No physical exam could be performed with this format.  Please refer to the patient's chart for his  consent to telehealth for [facility].   Patient has given verbal permission to conduct this visit via virtual appointment and to bill insurance 06/18/2021 5:57 PM     Evaluation Performed:  Follow-up visit  Date:  06/18/2021   ID:  Nathaniel Gill, Nathaniel Gill [birth year], MRN [id number]  Patient Location: Home Provider Location: Home Office  PCP:  Unk Pinto, MD  Cardiologist:  [name], MD  Electrophysiologist:  None   Chief Complaint:   Chief Complaint  Patient presents with   Follow-up    Discussed the results of Coronary Calcium Score.  Exercising routinely, no chest pain.    ====================================  ASSESSMENT & PLAN:    Problem List Items Addressed This Visit       Cardiology Problems   Hyperlipidemia associated with type 2 diabetes mellitus (HCC) (Chronic)    Most recent panel showed LDL of 98.  With this level Coronary Calcium Score, target should be LDL less than 70 and a minimum, but would prefer less than 55.  Presence of significant amount of coronary calcium would suggest more underlying noncalcified disease.  Plan: Recommend reevaluating lipids in 3 to 4 months after starting Zetia.   If not yet at goal, will consider referral to lipid clinic to potentially initiate PCSK9 inhibitor versus inclisiran.  A1c was 6.4.  Started on Ozempic by PCP.      Relevant Medications   carvedilol (COREG) 12.5 MG tablet   Other Relevant Orders   CT CORONARY MORPH W/CTA COR W/SCORE W/CA W/CM &/OR WO/CM   Resistant hypertension (Chronic)    Blood pressures seem to be higher at the doctor's office.  He is on combination of high-dose amlodipine and Benicar-HCTZ.  Apparently he is not taking his carvedilol 12.5 mg twice daily-he is actually taking 12.5 mg once daily.  Will correct this for now and have him take 1/2 tablet twice daily of the carvedilol and reassess.  This was not discovered till after the interview was over.      Relevant Medications   carvedilol (COREG) 12.5 MG tablet   Other Relevant Orders   CT CORONARY MORPH W/CTA COR W/SCORE W/CA W/CM &/OR WO/CM   [place]atston coronary artery calcium score greater than 400 (1036 on 06/04/2021) - Primary (Chronic)    Pretty significantly elevated coronary calcium score for somebody was asymptomatic.  He clearly has risk factors with hypertension hyperlipidemia, and multiple family members having premature CAD.  We talked about further risk stratification I think the best option will be to proceed with coronary CTA with possible FFRCT.  Also need to be more aggressive with lipid and blood pressure management.  Plan: Coronary CT angiogram with CT FFR. Continue Zetia that was just added along with rosuvastatin.  We will need to  have labs rechecked in about 3 months to reassess.  Otherwise would probably consider referral for PCSK9 inhibitor He is currently taking carvedilol, albeit not correctly.  We will have him switch to taking 1/2 tablet twice daily, and reassess blood pressure. He is also on Benicar HCT and amlodipine at max dose. Clearly recommend aspirin based on this level Coronary Calcium Score.      Relevant Medications    carvedilol (COREG) 12.5 MG tablet   Other Relevant Orders   CT CORONARY MORPH W/CTA COR W/SCORE W/CA W/CM &/OR WO/CM     Other   Metabolic syndrome (Chronic)    He has combination of obesity, hypertension and diabetes meets the criteria for metabolic syndrome, this in commendation with dyslipidemia and significant family history of premature CAD.  High risk of CAD confirmed by Coronary Calcium Score-Further Risk Stratification with Coronary CTA-CT FFR. Max and I would imagine based on his lack of symptoms and increased level activity, but he would not have obstructive disease.  However this would further quantify the extent of CAD in order for [place] determine how aggressive we need to be with LDL target of 70 versus 55.      Relevant Orders   CT CORONARY MORPH W/CTA COR W/SCORE W/CA W/CM &/OR WO/CM   Other Visit Diagnoses     Abnormal electrocardiogram       Relevant Orders   CT CORONARY MORPH W/CTA COR W/SCORE W/CA W/CM &/OR WO/CM       ====================================  History of Present Illness:    Nathaniel Gill is a 70 y.o. male with PMH notable for Aortic Atherosclerosis., obesity, HLD, DM-2 & resistant HTN (~ Metabolic Syndrome) who presents via audio/video conferencing for a telehealth visit today as a ~1 month f/u to discuss results of tests. - Abnormal Coronary Calcium Score.   Nathaniel Gill was last seen 05/10/2021 for annual f/u & CV Risk Evaluation - Coronary Calcium Score ordered.  -> Noted having vague "weird" symptoms in Oct-- BP high & HR low. Lightheaded & fuzzy. 10 lb wgt gain.  => mostly resolved,  BP better, but occasionally in 160s. Trivial edema. Walking ~ 1 mile/day without any CP.  We decided to check a Coronary Calcium Score for Baseline Cardiovascular Risk assessment.  Hospitalizations:  none  Recent - Interim CV studies:    The following studies were reviewed today: 06/04/2021: Coronary Calcium Score 1036 (LM 154, LAD 339, LCx 252, RCA  290):  Inerval History   Nathaniel Gill seems to be doing overall pretty well from a cardiac standpoint.  He says he walks almost 5 or 6 miles a day on the job.  He also tries to exercise routinely.  He does weightlifting and other strength conditioning exercises.  He says he feels like he is exercising circles around the younger people that he is around.  He has not really had any more of the unusual symptoms that he had back in October.  He is feeling much better.  Energy level is better.  Blood pressure seems to be more stable.  He was very surprised at the result of his Coronary Calcium Score indicating that he was expecting it to be relatively reassuring.  Cardiovascular ROS: no chest pain or dyspnea on exertion negative for - edema, irregular heartbeat, orthopnea, palpitations, paroxysmal nocturnal dyspnea, rapid heart rate, shortness of breath, or lightheadedness or dizziness, syncope/near syncope or TIA/amaurosis fugax, claudication  His PCP recently started him on Zetia based on his most recent  lipid panel and also started Ozempic.  Hoping to lose some weight as result of Ozempic.  ROS:  Please see the history of present illness.     Review of Systems  Constitutional:  Positive for weight loss (Maybe a little bit.  Could be different scales.). Negative for malaise/fatigue.  HENT:  Negative for congestion and nosebleeds.        Mild seasonal allergies.  Respiratory: Negative.    Cardiovascular: Negative.   Gastrointestinal:  Negative for blood in stool and melena.  Genitourinary:  Negative for hematuria.  Musculoskeletal:  Negative for joint pain.  Neurological:  Negative for dizziness and focal weakness.  Psychiatric/Behavioral: Negative.     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 Hilarie Fredrickson:  Low-grade of narrowing Schatzki ring. Dilated to 18 mm with balloon. - 3 cm hiatal hernia. - Normal stomach. 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    GERD (gastroesophageal reflux disease)    Hiatal hernia    History of colon polyps    Hyperlipidemia    Hypertension    No sign of renal artery stenosis   IBS (irritable bowel syndrome)    RBBB    Systolic murmur    no  current problems   Vertigo    Vitamin D deficiency    Past Surgical History:  Procedure Laterality Date   ABDOMINAL SURGERY  1956   Related to bleeding episode.    BIOPSY  01/27/2020   Procedure: BIOPSY;  Surgeon: Rush Landmark Telford Nab., MD;  Location: [facility] [facility];  Service: Gastroenterology;;   BIOPSY  01/21/2021   Procedure: BIOPSY;  Surgeon: Irving Copas., MD;  Location: [facility];  Service: Gastroenterology;;   CARDIAC CATHETERIZATION  2005   no intervention per patient   CARPAL TUNNEL RELEASE Left 03/06/2020   Dr. Amedeo Plenty   COCHLEAR IMPLANT Bilateral    COLONOSCOPY     several - Last one 11/22/19   CYST REMOVAL HAND Left 1975   [place]   ENDOSCOPIC MUCOSAL RESECTION N/A 01/27/2020   Procedure: ENDOSCOPIC MUCOSAL RESECTION;  Surgeon: Irving Copas., MD;  Location: [facility];  Service: Gastroenterology;  Laterality: N/A;   ENTEROSCOPY N/A 01/27/2020   Procedure: ENTEROSCOPY;  Surgeon: Rush Landmark Telford Nab., MD;  Location: [facility];  Service: Gastroenterology;  Laterality: N/A;   ESOPHAGOGASTRODUODENOSCOPY (EGD) WITH PROPOFOL N/A 01/21/2021   Procedure: ESOPHAGOGASTRODUODENOSCOPY (EGD) WITH PROPOFOL;  Surgeon: Rush Landmark Telford Nab., MD;  Location: [facility];  Service: Gastroenterology;  Laterality: N/A;   HEMOSTASIS CLIP PLACEMENT  01/27/2020   Procedure: HEMOSTASIS CLIP PLACEMENT;  Surgeon:  Rush Landmark Telford Nab., MD;  Location: [facility];  Service: Gastroenterology;;   SKIN LESION EXCISION     STAPEDECTOMY Bilateral    x 2 - 10 yrs apart - No MRIs per patient.   SUBMUCOSAL LIFTING INJECTION  01/27/2020   Procedure: SUBMUCOSAL LIFTING INJECTION;  Surgeon: Rush Landmark Telford Nab., MD;  Location: [facility];  Service: Gastroenterology;;   SUBMUCOSAL TATTOO INJECTION  01/27/2020   Procedure: SUBMUCOSAL TATTOO INJECTION;  Surgeon: Irving Copas., MD;  Location: [facility];  Service: Gastroenterology;;  TRANSTHORACIC ECHOCARDIOGRAM  07/2017   Normal LV size and function.  EF 55-60%.  No RWMA.  Basal septal hypertrophy, but no sign of significant hypertensive heart disease.  Only GR 1 DD.   TRIGGER FINGER RELEASE Right 2014   Thumb   UPPER GI ENDOSCOPY     several - last one 11/22/2019   WISDOM TOOTH EXTRACTION       Current Meds  Medication Sig   amLODipine (NORVASC) 10 MG tablet TAKE ONE TABLET BY MOUTH DAILY FOR BLOOD PRESSURE   Ascorbic Acid (VITAMIN C) 1000 MG tablet Take 1,000 mg by mouth in the morning and at bedtime.   aspirin 81 MG tablet Take 81 mg by mouth daily.   BIOTIN PO Take 1 tablet by mouth daily.   carvedilol (COREG) 12.5 MG tablet Take 12.5 mg by mouth 2 (two) times daily with a meal.   ezetimibe (ZETIA) 10 MG tablet Take 1 tablet (10 mg total) by mouth daily.   Magnesium 400 MG TABS Take 400 mg by mouth daily.   olmesartan-hydrochlorothiazide (BENICAR HCT) 40-25 MG tablet TAKE ONE TABLET BY MOUTH DAILY FOR BLOOD PRESSURE   omeprazole (PRILOSEC) 40 MG capsule Take  1 capsule  Daily  to Prevent Heartburn & Indigestion   OVER THE COUNTER MEDICATION Place 1 spray into the nose daily as needed (congestion). Xylitol (X-Clear)   rosuvastatin (CRESTOR) 40 MG tablet Take  1 tablet  Daily  for Cholesterol                                                         /                                 TAKE ONE TABLET BY MOUTH   Semaglutide,0.25 or 0.5MG /DOS,  (OZEMPIC, 0.25 OR 0.5 MG/DOSE,) 2 MG/1.5ML SOPN Start by injecting 0.25 mg into skin of stomach once weekly; if doing well increase to 0.5 mg in 4 weeks.   Thiamine HCl (VITAMIN B-1) 250 MG tablet Take 250 mg by mouth daily.   vitamin E 200 UNIT capsule Take 200 Units by mouth 2 (two) times daily.   Zinc 50 MG TABS Take 50 mg by mouth daily.   He has only been taking his carvedilol once daily.  Allergies:   Patient has no known allergies.   Social History   Tobacco Use   Smoking status: Never   Smokeless tobacco: Never  Vaping Use   Vaping Use: Never used  Substance Use Topics   Alcohol use: Yes    Comment: occasional wine   Drug use: No    Comment: In 6s - cocaine, marijuana     Family Hx: The patient's family history includes Arrhythmia in his brother; COPD in his mother; Heart attack in his brother; Heart disease in his mother; Heart failure in his mother; Hyperlipidemia in his brother and brother; Hypertension in his brother and brother; Kidney disease in his father. There is no history of Esophageal cancer or Colon cancer.   Labs/Other Tests and Data Reviewed:    EKG:  No ECG reviewed.  Recent Labs: 06/16/2021: ALT 28; BUN 17; Creat 0.88; Hemoglobin 14.4; Magnesium 2.2; Platelets 276; Potassium 4.3; Sodium 140; TSH 3.54  Lab Results  Component  Value Date   HGBA1C 6.4 (H) 06/16/2021    Recent Lipid Panel Lab Results  Component Value Date/Time   CHOL 163 06/16/2021 10:55 AM   TRIG 126 06/16/2021 10:55 AM   HDL 42 06/16/2021 10:55 AM   CHOLHDL 3.9 06/16/2021 10:55 AM   LDLCALC 98 06/16/2021 10:55 AM    Wt Readings from Last 3 Encounters:  06/18/21 228 lb (103.4 kg)  06/16/21 234 lb (106.1 kg)  05/10/21 233 lb 3.2 oz (105.8 kg)     Objective:    Vital Signs:  Pulse 61    Ht 6\' 1"  (1.854 m)    Wt 228 lb (103.4 kg)    BMI 30.08 kg/m   VITAL SIGNS:  reviewed GEN:  no acute distress RESPIRATORY:  non-labored NEURO:  alert and oriented x 3, no obvious focal  deficit PSYCH:  normal affect   ==========================================  COVID-19 Education: The signs and symptoms of COVID-19 were discussed with the patient and how to seek care for testing (follow up with PCP or arrange E-visit).   The importance of social distancing was discussed today.  Time:   Today, I have spent 36 minutes with the patient with telehealth technology discussing the above problems.   We talked in detail about the results of his Coronary Calcium Score, what it means from a physiologic and anatomic standpoint.  He talked about his exercise level we discussed lipids and weight loss/diabetes. An additional 11 minutes spent charting (reviewing prior notes, hospital records, studies, labs etc.) Total 47 minutes   Medication Adjustments/Labs and Tests Ordered: Current medicines are reviewed at length with the patient today.  Concerns regarding medicines are outlined above.   Patient Instructions  Medication Instructions:   Totally agree with Ozempic & Zetia  Continue taking Carvedilol 12.5 mg twice  a day     *If you need a refill on your cardiac medications before your next appointment, please call your pharmacy*   Lab Work:  -- PCP should be rechecking Cholesterol (Lipid) Panel ~3-4 months after starting Zetia --> if not, we can.  If you have labs (blood work) drawn today and your tests are completely normal, you will receive your results only by: [place] (if you have MyChart) OR A paper copy in the mail If you have any lab test that is abnormal or we need to change your treatment, we will call you to review the results.   Testing/Procedures:  CORONARY CT ANGIOGRAM  will be schedule at [facility] Radiology dept , once authorization is obtained by your insurance.  Your physician has requested that you have cardiac CT. Cardiac computed tomography (CT) is a painless test that uses an x-ray machine to take clear, detailed pictures of your heart.  Please follow instruction sheet as given.     Follow-Up: At [facility], you and your health needs are our priority.  As part of our continuing mission to provide you with exceptional heart care, we have created designated Provider Care Teams.  These Care Teams include your primary Cardiologist (physician) and Advanced Practice Providers (APPs -  Physician Assistants and Nurse Practitioners) who all work together to provide you with the care you need, when you need it.  We recommend signing up for the patient portal called "MyChart".  Sign up information is provided on this After Visit Summary.  MyChart is used to connect with patients for Virtual Visits (Telemedicine).  Patients are able to view lab/test results, encounter notes, upcoming appointments, etc.  Non-urgent  messages can be sent to your provider as well.   To learn more about what you can do with MyChart, go to [email or web address].    Your next appointment:   2 month(s) (6-8 WEEKS)  The format for your next appointment:   In Person  Provider:   [name], MD}   - IF UNABLE TO GET ON MD SCHEDULE - OK TO SEE APP.  Other Instructions KEEP EXERCISING & STAYING AS HEALTY (WITH DIET) AS POSSIBLE.  [place]!!!       Your cardiac CT will be scheduled at one of the below locations:   [facility] [address] [place], [place] [address] [phone]       Please arrive at the [facility] main entrance (entrance A) of [facility] 30 minutes prior to test start time. You can use the FREE valet parking offered at the main entrance (encouraged to control the heart rate for the test) Proceed to the [facility] Radiology Department (first floor) to check-in and test prep.    Please follow these instructions carefully (unless otherwise directed):  You do not need lab work since you had lab (CMP) on 06/16/21  On the Night Before the Test: Be sure to Drink plenty of  water. Do not consume any caffeinated/decaffeinated beverages or chocolate 12 hours prior to your test. Do not take any antihistamines 12 hours prior to your test.   On the Day of the Test: Drink plenty of water until 1 hour prior to the test. Do not eat any food 4 hours prior to the test. You may take your regular medications prior to the test.  Take Carvedilol two hours prior to test.   HOLD Olmesartan- Hydrochlorothiazide morning of the test.       After the Test: Drink plenty of water. After receiving IV contrast, you may experience a mild flushed feeling. This is normal. On occasion, you may experience a mild rash up to 24 hours after the test. This is not dangerous. If this occurs, you can take Benadryl 25 mg and increase your fluid intake. If you experience trouble breathing, this can be serious. If it is severe call 911 IMMEDIATELY. If it is mild, please call our office.   Please allow 2-4 weeks for scheduling of routine cardiac CTs. Some insurance companies require a pre-authorization which may delay scheduling of this test.   For non-scheduling related questions, please contact the cardiac imaging nurse navigator should you have any questions/concerns: Marchia Bond, Cardiac Imaging Nurse Navigator Gordy Clement, Cardiac Imaging Nurse Navigator [facility] Direct Office Dial: [phone]   For scheduling needs, including cancellations and rescheduling, please call [place], [phone].    Signed, [name], MD  06/18/2021 5:57 PM    [place]

## 2021-06-18 NOTE — Patient Instructions (Addendum)
Medication Instructions:   Totally agree with Ozempic & Zetia  Continue taking Carvedilol 12.5 mg twice  a day     *If you need a refill on your cardiac medications before your next appointment, please call your pharmacy*   Lab Work:  -- PCP should be rechecking Cholesterol (Lipid) Panel ~3-4 months after starting Zetia --> if not, we can.  If you have labs (blood work) drawn today and your tests are completely normal, you will receive your results only by: [place] (if you have MyChart) OR A paper copy in the mail If you have any lab test that is abnormal or we need to change your treatment, we will call you to review the results.   Testing/Procedures:  CORONARY CT ANGIOGRAM  will be schedule at [facility] Radiology dept , once authorization is obtained by your insurance.  Your physician has requested that you have cardiac CT. Cardiac computed tomography (CT) is a painless test that uses an x-ray machine to take clear, detailed pictures of your heart. Please follow instruction sheet as given.     Follow-Up: At [facility], you and your health needs are our priority.  As part of our continuing mission to provide you with exceptional heart care, we have created designated Provider Care Teams.  These Care Teams include your primary Cardiologist (physician) and Advanced Practice Providers (APPs -  Physician Assistants and Nurse Practitioners) who all work together to provide you with the care you need, when you need it.  We recommend signing up for the patient portal called "MyChart".  Sign up information is provided on this After Visit Summary.  MyChart is used to connect with patients for Virtual Visits (Telemedicine).  Patients are able to view lab/test results, encounter notes, upcoming appointments, etc.  Non-urgent messages can be sent to your provider as well.   To learn more about what you can do with MyChart, go to [email or web address].    Your next appointment:    2 month(s) (6-8 WEEKS)  The format for your next appointment:   In Person  Provider:   [name], MD}   - IF UNABLE TO GET ON MD SCHEDULE - OK TO SEE APP.  Other Instructions KEEP EXERCISING & STAYING AS HEALTY (WITH DIET) AS POSSIBLE.  [place]eno!!!       Your cardiac CT will be scheduled at one of the below locations:   [facility] [address] [place], [place] [address] [phone]       Please arrive at the [facility] main entrance (entrance A) of [facility] 30 minutes prior to test start time. You can use the FREE valet parking offered at the main entrance (encouraged to control the heart rate for the test) Proceed to the [facility] Radiology Department (first floor) to check-in and test prep.    Please follow these instructions carefully (unless otherwise directed):  You do not need lab work since you had lab (CMP) on 06/16/21  On the Night Before the Test: Be sure to Drink plenty of water. Do not consume any caffeinated/decaffeinated beverages or chocolate 12 hours prior to your test. Do not take any antihistamines 12 hours prior to your test.   On the Day of the Test: Drink plenty of water until 1 hour prior to the test. Do not eat any food 4 hours prior to the test. You may take your regular medications prior to the test.  Take Carvedilol two hours prior  to test.   HOLD Olmesartan- Hydrochlorothiazide morning of the test.       After the Test: Drink plenty of water. After receiving IV contrast, you may experience a mild flushed feeling. This is normal. On occasion, you may experience a mild rash up to 24 hours after the test. This is not dangerous. If this occurs, you can take Benadryl 25 mg and increase your fluid intake. If you experience trouble breathing, this can be serious. If it is severe call 911 IMMEDIATELY. If it is mild, please call our office.   Please allow 2-4 weeks for  scheduling of routine cardiac CTs. Some insurance companies require a pre-authorization which may delay scheduling of this test.   For non-scheduling related questions, please contact the cardiac imaging nurse navigator should you have any questions/concerns: Marchia Bond, Cardiac Imaging Nurse Navigator Gordy Clement, Cardiac Imaging Nurse Navigator [facility] Direct Office Dial: [phone]   For scheduling needs, including cancellations and rescheduling, please call [place], [phone].

## 2021-06-18 NOTE — Assessment & Plan Note (Signed)
Blood pressures seem to be higher at the doctor's office.  He is on combination of high-dose amlodipine and Benicar-HCTZ.  Apparently he is not taking his carvedilol 12.5 mg twice daily-he is actually taking 12.5 mg once daily.  Will correct this for now and have him take 1/2 tablet twice daily of the carvedilol and reassess.  This was not discovered till after the interview was over.

## 2021-06-19 ENCOUNTER — Encounter: Payer: Self-pay | Admitting: Cardiology

## 2021-06-19 ENCOUNTER — Encounter: Payer: Self-pay | Admitting: Adult Health

## 2021-06-23 NOTE — Telephone Encounter (Signed)
Radiation sling.  Blood pressure and heart rate seem okay, but may be a little low for you.  Recommendation is to make sure that you are taking one half of the carvedilol tablet twice daily, but must reduce the amlodipine dose to 1/2 tablet.  Continue current dose of the of the blood pressure medication.   [name], MD

## 2021-07-12 ENCOUNTER — Encounter: Payer: Self-pay | Admitting: Adult Health

## 2021-07-12 ENCOUNTER — Telehealth (HOSPITAL_COMMUNITY): Payer: Self-pay | Admitting: Emergency Medicine

## 2021-07-12 NOTE — Telephone Encounter (Signed)
Reaching out to patient to offer assistance regarding upcoming cardiac imaging study; pt verbalizes understanding of appt date/time, parking situation and where to check in, pre-test NPO status and medications ordered, and verified current allergies; name and call back number provided for further questions should they arise Lavoy Bernards RN Navigator Cardiac Imaging [facility] [phone] office [phone] cell 

## 2021-07-13 ENCOUNTER — Ambulatory Visit (HOSPITAL_COMMUNITY)
Admission: RE | Admit: 2021-07-13 | Discharge: 2021-07-13 | Disposition: A | Discharge status: Home / Self Care | Payer: 59 | Source: Ambulatory Visit | Attending: Cardiology | Admitting: Cardiology

## 2021-07-13 ENCOUNTER — Other Ambulatory Visit: Payer: Self-pay | Admitting: Cardiology

## 2021-07-13 ENCOUNTER — Other Ambulatory Visit: Payer: Self-pay

## 2021-07-13 DIAGNOSIS — E8881 Metabolic syndrome: Secondary | ICD-10-CM | POA: Diagnosis not present

## 2021-07-13 DIAGNOSIS — R931 Abnormal findings on diagnostic imaging of heart and coronary circulation: Secondary | ICD-10-CM | POA: Diagnosis not present

## 2021-07-13 DIAGNOSIS — E1169 Type 2 diabetes mellitus with other specified complication: Secondary | ICD-10-CM | POA: Diagnosis not present

## 2021-07-13 DIAGNOSIS — E785 Hyperlipidemia, unspecified: Secondary | ICD-10-CM | POA: Diagnosis not present

## 2021-07-13 DIAGNOSIS — I2510 Atherosclerotic heart disease of native coronary artery without angina pectoris: Secondary | ICD-10-CM | POA: Diagnosis not present

## 2021-07-13 DIAGNOSIS — R9431 Abnormal electrocardiogram [ECG] [EKG]: Secondary | ICD-10-CM | POA: Diagnosis not present

## 2021-07-13 DIAGNOSIS — I10 Essential (primary) hypertension: Secondary | ICD-10-CM | POA: Diagnosis not present

## 2021-07-13 HISTORY — DX: Atherosclerotic heart disease of native coronary artery without angina pectoris: I25.10

## 2021-07-13 MED ORDER — NITROGLYCERIN 0.4 MG SL SUBL: 0.8000 mg | SUBLINGUAL_TABLET | Freq: Once | SUBLINGUAL | Status: DC

## 2021-07-13 MED ORDER — NITROGLYCERIN 0.4 MG SL SUBL
SUBLINGUAL_TABLET | SUBLINGUAL | Status: AC
  Filled 2021-07-13: qty 2

## 2021-07-13 MED ORDER — IOHEXOL 350 MG/ML SOLN
100.0000 mL | Freq: Once | INTRAVENOUS | Status: AC | PRN
  Administered 2021-07-13: 95 mL via INTRAVENOUS

## 2021-07-13 MED ORDER — DILTIAZEM HCL 25 MG/5ML IV SOLN: 5.0000 mg | INTRAVENOUS | Status: DC | PRN

## 2021-07-13 MED ORDER — METOPROLOL TARTRATE 5 MG/5ML IV SOLN: 5.0000 mg | INTRAVENOUS | Status: DC | PRN

## 2021-07-26 DIAGNOSIS — M25562 Pain in left knee: Secondary | ICD-10-CM | POA: Diagnosis not present

## 2021-08-18 ENCOUNTER — Ambulatory Visit (INDEPENDENT_AMBULATORY_CARE_PROVIDER_SITE_OTHER): Payer: 59 | Admitting: Cardiology

## 2021-08-18 ENCOUNTER — Other Ambulatory Visit: Payer: Self-pay

## 2021-08-18 ENCOUNTER — Encounter: Payer: Self-pay | Admitting: Cardiology

## 2021-08-18 VITALS — BP 110/78 | HR 67 | Ht 73.0 in | Wt 234.6 lb

## 2021-08-18 DIAGNOSIS — I10 Essential (primary) hypertension: Secondary | ICD-10-CM

## 2021-08-18 DIAGNOSIS — E785 Hyperlipidemia, unspecified: Secondary | ICD-10-CM

## 2021-08-18 DIAGNOSIS — R931 Abnormal findings on diagnostic imaging of heart and coronary circulation: Secondary | ICD-10-CM

## 2021-08-18 DIAGNOSIS — E1169 Type 2 diabetes mellitus with other specified complication: Secondary | ICD-10-CM

## 2021-08-18 DIAGNOSIS — I4510 Unspecified right bundle-branch block: Secondary | ICD-10-CM

## 2021-08-18 NOTE — Patient Instructions (Addendum)
Medication Instructions:   No changes  *If you need a refill on your cardiac medications before your next appointment, please call your pharmacy*   Lab Work:   Please have primary to do labs - mainly cholesterol  level  in May 2023    Testing/Procedures:  Not needed  Follow-Up: At [facility], you and your health needs are our priority.  As part of our continuing mission to provide you with exceptional heart care, we have created designated Provider Care Teams.  These Care Teams include your primary Cardiologist (physician) and Advanced Practice Providers (APPs -  Physician Assistants and Nurse Practitioners) who all work together to provide you with the care you need, when you need it.     Your next appointment:   12 month(s)  The format for your next appointment:   In Person  Provider:   [name], MD

## 2021-08-18 NOTE — Progress Notes (Unsigned)
Primary Care Provider: Unk Pinto, MD Cardiologist: Nathaniel Hew, MD Electrophysiologist: None  Clinic Note: No chief complaint on file.   ===================================  ASSESSMENT/PLAN   Problem List Items Addressed This Visit   None   ===================================  HPI:    Nathaniel Gill is a 71 y.o. male with a PMH notable for HLD with DM-2, resistant HTN (metabolic syndrome) with elevated Coronary Calcium Score below who presents today for 47-month follow-up to discuss results of Coronary CTA.  Nathaniel Gill was last seen on 06/18/2021 to discuss results of Coronary Calcium Score.  He was doing pretty well Nathaniel Gill standpoint walking 5 to 6 miles a day on the job.  Exercises routinely doing weightlifting and strength and conditioning exercises.  He can exercise circles around other people at the gym.  No chest pain or pressure.  Even lost little weight. 06/04/2021: Coronary Calcium Score 1036 (LM 154, LAD 339, LCx 252, RCA 290):   Recent Hospitalizations:  None  Reviewed  CV studies:    The following studies were reviewed today: (if available, images/films reviewed: From Epic Chart or Care Everywhere) Coronary CTA-FFR: Coronary Calcium Score 837.  Mixed moderate plaque in the proximal RCA (50 to 69%).  Mild calcified plaque in the mid RCA (25-49%).  Minimal calcified plaque in proximal LCx (0-24%).  Mid LCx calcified plaque (50 to 69%).  Also proximal OM1 calcified plaque (50-69%).  Proximal LAD mild to 0-24%.  Left main mild 0-24% calcified plaque. => CT FFR was positive only for the distal LCx.  Negative findings in the LAD, OM1 and  and RCA. => Distal LCx is relatively small caliber vessel.  Would not recommend catheterization.   Interval History:   Nathaniel Gill   CV Review of Symptoms (Summary) Cardiovascular ROS: {roscv:310661}  REVIEWED OF SYSTEMS   ROS  I have reviewed and (if needed) personally updated the patient's problem  list, medications, allergies, past medical and surgical history, social and family history.   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 Hilarie Fredrickson:  Low-grade of narrowing Schatzki ring. Dilated to 18 mm with balloon. - 3 cm hiatal hernia. - Normal stomach. 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    GERD (gastroesophageal reflux disease)    Hiatal hernia    History of colon polyps    Hyperlipidemia    Hypertension    No sign of renal artery stenosis   IBS (irritable bowel syndrome)    RBBB    Systolic murmur    no  current problems   Vertigo    Vitamin D deficiency     PAST SURGICAL HISTORY   Past Surgical History:  Procedure Laterality Date   ABDOMINAL SURGERY  1956   Related to bleeding episode.    BIOPSY  01/27/2020   Procedure: BIOPSY;  Surgeon: Rush Landmark Telford Nab., MD;  Location: [place];  Service: Gastroenterology;;   BIOPSY  01/21/2021   Procedure: BIOPSY;  Surgeon: Irving Copas., MD;  Location: [place];  Service: Gastroenterology;;   CARDIAC CATHETERIZATION  2005  no intervention per patient   CARPAL TUNNEL RELEASE Left 03/06/2020   Dr. Amedeo Plenty   COCHLEAR IMPLANT Bilateral    COLONOSCOPY     several - Last one 11/22/19   CYST REMOVAL HAND Left 1975   [place]   ENDOSCOPIC MUCOSAL RESECTION N/A 01/27/2020   Procedure: ENDOSCOPIC MUCOSAL RESECTION;  Surgeon: Irving Copas., MD;  Location: [place];  Service: Gastroenterology;  Laterality: N/A;   ENTEROSCOPY N/A 01/27/2020   Procedure: ENTEROSCOPY;  Surgeon: Rush Landmark Telford Nab., MD;  Location:  [place];  Service: Gastroenterology;  Laterality: N/A;   ESOPHAGOGASTRODUODENOSCOPY (EGD) WITH PROPOFOL N/A 01/21/2021   Procedure: ESOPHAGOGASTRODUODENOSCOPY (EGD) WITH PROPOFOL;  Surgeon: Rush Landmark Telford Nab., MD;  Location: [place];  Service: Gastroenterology;  Laterality: N/A;   HEMOSTASIS CLIP PLACEMENT  01/27/2020   Procedure: HEMOSTASIS CLIP PLACEMENT;  Surgeon: Rush Landmark Telford Nab., MD;  Location: [place];  Service: Gastroenterology;;   SKIN LESION EXCISION     STAPEDECTOMY Bilateral    x 2 - 10 yrs apart - No MRIs per patient.   SUBMUCOSAL LIFTING INJECTION  01/27/2020   Procedure: SUBMUCOSAL LIFTING INJECTION;  Surgeon: Rush Landmark Telford Nab., MD;  Location: [place];  Service: Gastroenterology;;   SUBMUCOSAL TATTOO INJECTION  01/27/2020   Procedure: SUBMUCOSAL TATTOO INJECTION;  Surgeon: Irving Copas., MD;  Location: [place];  Service: Gastroenterology;;   TRANSTHORACIC ECHOCARDIOGRAM  07/2017   Normal LV size and function.  EF 55-60%.  No RWMA.  Basal septal hypertrophy, but no sign of significant hypertensive heart disease.  Only GR 1 DD.   TRIGGER FINGER RELEASE Right 2014   Thumb   UPPER GI ENDOSCOPY     several - last one 11/22/2019   WISDOM TOOTH EXTRACTION      Immunization History  Administered Date(s) Administered   Influenza Split 08/28/2007   Pneumococcal Conjugate-13 04/16/2018   Pneumococcal Polysaccharide-23 05/20/2019   Tdap 11/19/2010    MEDICATIONS/ALLERGIES   Current Meds  Medication Sig   amLODipine (NORVASC) 10 MG tablet TAKE ONE TABLET BY MOUTH DAILY FOR BLOOD PRESSURE   Ascorbic Acid (VITAMIN C) 1000 MG tablet Take 1,000 mg by mouth in the morning and at bedtime.   aspirin 81 MG tablet Take 81 mg by mouth daily.   BIOTIN PO Take 1 tablet by mouth daily.   carvedilol (COREG) 12.5 MG tablet Take 12.5 mg by mouth 2 (two) times daily with a meal.   ezetimibe (ZETIA) 10 MG tablet Take 1 tablet (10 mg total) by  mouth daily.   Magnesium 400 MG TABS Take 400 mg by mouth daily.   olmesartan-hydrochlorothiazide (BENICAR HCT) 40-25 MG tablet TAKE ONE TABLET BY MOUTH DAILY FOR BLOOD PRESSURE   omeprazole (PRILOSEC) 40 MG capsule Take  1 capsule  Daily  to Prevent Heartburn & Indigestion   OVER THE COUNTER MEDICATION Place 1 spray into the nose daily as needed (congestion). Xylitol (X-Clear)   rosuvastatin (CRESTOR) 40 MG tablet Take  1 tablet  Daily  for Cholesterol                                                         /  TAKE ONE TABLET BY MOUTH   Semaglutide,0.25 or 0.5MG /DOS, (OZEMPIC, 0.25 OR 0.5 MG/DOSE,) 2 MG/1.5ML SOPN Start by injecting 0.25 mg into skin of stomach once weekly; if doing well increase to 0.5 mg in 4 weeks.   Thiamine HCl (VITAMIN B-1) 250 MG tablet Take 250 mg by mouth daily.   vitamin E 200 UNIT capsule Take 200 Units by mouth 2 (two) times daily.   Zinc 50 MG TABS Take 50 mg by mouth daily.     No Known Allergies  SOCIAL HISTORY/FAMILY HISTORY   Reviewed in Epic:  Pertinent findings:  Social History   Tobacco Use   Smoking status: Never   Smokeless tobacco: Never  Vaping Use   Vaping Use: Never used  Substance Use Topics   Alcohol use: Yes    Comment: occasional wine   Drug use: No    Comment: In 85s - cocaine, marijuana   Social History   Social History Narrative   Not on file    OBJCTIVE -PE, EKG, labs   Wt Readings from Last 3 Encounters:  08/18/21 234 lb 9.6 oz (106.4 kg)  06/18/21 228 lb (103.4 kg)  06/16/21 234 lb (106.1 kg)    Physical Exam: BP 110/78    Pulse 67    Ht 6\' 1"  (1.854 m)    Wt 234 lb 9.6 oz (106.4 kg)    SpO2 97%    BMI 30.95 kg/m  Physical Exam   Adult ECG Report  Rate: *** ;  Rhythm: {rhythm:17366};   Narrative Interpretation: ***  Recent Labs:  ***  Lab Results  Component Value Date   CHOL 163 06/16/2021   HDL 42 06/16/2021   LDLCALC 98 06/16/2021   TRIG 126 06/16/2021   CHOLHDL 3.9  06/16/2021   Lab Results  Component Value Date   CREATININE 0.88 06/16/2021   BUN 17 06/16/2021   NA 140 06/16/2021   K 4.3 06/16/2021   CL 102 06/16/2021   CO2 30 06/16/2021   CBC Latest Ref Rng & Units 06/16/2021 03/02/2021 11/23/2020  WBC 3.8 - 10.8 Thousand/uL 4.8 5.9 7.0  Hemoglobin 13.2 - 17.1 g/dL 14.4 14.1 14.9  Hematocrit 38.5 - 50.0 % 42.9 42.1 44.5  Platelets 140 - 400 Thousand/uL 276 305 287    Lab Results  Component Value Date   HGBA1C 6.4 (H) 06/16/2021   Lab Results  Component Value Date   TSH 3.54 06/16/2021    ==================================================  COVID-19 Education: The signs and symptoms of COVID-19 were discussed with the patient and how to seek care for testing (follow up with PCP or arrange E-visit).    I spent a total of ***minutes with the patient spent in direct patient consultation.  Additional time spent with chart review  / charting (studies, outside notes, etc): *** min Total Time: *** min  Current medicines are reviewed at length with the patient today.  (+/- concerns) ***  This visit occurred during the SARS-CoV-2 public health emergency.  Safety protocols were in place, including screening questions prior to the visit, additional usage of staff PPE, and extensive cleaning of exam room while observing appropriate contact time as indicated for disinfecting solutions.  Notice: This dictation was prepared with Dragon dictation along with smart phrase technology. Any transcriptional errors that result from this process are unintentional and may not be corrected upon review.  Studies Ordered:   No orders of the defined types were placed in this encounter.   Patient Instructions / Medication Changes &  Studies & Tests Ordered   There are no Patient Instructions on file for this visit.     Nathaniel Gill, M.D., M.S. Interventional Cardiologist   Pager # [phone] Phone # [phone] [address] [place], [place] [address]   Thank you for choosing Heartcare at [facility]!!

## 2021-08-22 DIAGNOSIS — H524 Presbyopia: Secondary | ICD-10-CM | POA: Diagnosis not present

## 2021-09-01 ENCOUNTER — Encounter: Payer: Self-pay | Admitting: Adult Health

## 2021-09-02 ENCOUNTER — Other Ambulatory Visit: Payer: Self-pay | Admitting: Adult Health

## 2021-09-02 DIAGNOSIS — N182 Chronic kidney disease, stage 2 (mild): Secondary | ICD-10-CM

## 2021-09-02 DIAGNOSIS — E1122 Type 2 diabetes mellitus with diabetic chronic kidney disease: Secondary | ICD-10-CM

## 2021-09-02 MED ORDER — OZEMPIC (1 MG/DOSE) 4 MG/3ML ~~LOC~~ SOPN: 1.0000 mg | PEN_INJECTOR | SUBCUTANEOUS | 1 refills | Status: DC

## 2021-09-11 ENCOUNTER — Encounter: Payer: Self-pay | Admitting: Adult Health

## 2021-09-17 ENCOUNTER — Other Ambulatory Visit: Payer: Self-pay | Admitting: Adult Health

## 2021-09-17 ENCOUNTER — Telehealth: Payer: Self-pay

## 2021-09-17 DIAGNOSIS — I10 Essential (primary) hypertension: Secondary | ICD-10-CM

## 2021-09-17 MED ORDER — AMLODIPINE BESYLATE 10 MG PO TABS: ORAL_TABLET | ORAL | 3 refills | Status: DC

## 2021-09-17 MED ORDER — OLMESARTAN MEDOXOMIL-HCTZ 40-25 MG PO TABS: ORAL_TABLET | ORAL | 3 refills | Status: DC

## 2021-09-17 NOTE — Telephone Encounter (Signed)
Refill request for Amlodipine & Olmesartan. ?

## 2021-09-25 ENCOUNTER — Encounter: Payer: Self-pay | Admitting: Cardiology

## 2021-09-25 NOTE — Assessment & Plan Note (Signed)
With evidence of coronary plaque, target LDL should be less than 70 now with her cholesterol as low as possible. ? ?Is on rosuvastatin 40 mg daily. ?His last lipids were from December.  Should be due for lab check a little bit because this was before his statin was increased and Zetia started. ?

## 2021-09-25 NOTE — Assessment & Plan Note (Signed)
Mild to moderate diffuse plaque noted on Coronary CTA.  Distal circumflex was positive but a very small vessel.  No indication for cardiac catheterization, especially with him being completely asymptomatic. ? ?Continue risk factor modification but he is stressing himself just with every day with a bit of walking is delayed.  If he were to have symptoms we would certainly know how to react. ? ?Continue high-dose statin plus Zetia for lipids, beta-blocker and amlodipine for antianginal benefit along with ARB-HCTZ for additional blood pressure and afterload control. ?

## 2021-09-25 NOTE — Assessment & Plan Note (Signed)
Blood pressure now looks pretty well controlled on current meds.  He is on moderate dose of carvedilol, 40-25 mg Benicar and HCTZ (max dose).  Also on 10 mg amlodipine with excellent blood pressure control today.  Very labile.  Told to try to work on stress relief for this as well. ?

## 2021-09-25 NOTE — Assessment & Plan Note (Signed)
Benign finding

## 2021-10-12 DIAGNOSIS — M79671 Pain in right foot: Secondary | ICD-10-CM | POA: Diagnosis not present

## 2021-10-12 DIAGNOSIS — M25562 Pain in left knee: Secondary | ICD-10-CM | POA: Diagnosis not present

## 2021-10-13 ENCOUNTER — Encounter: Payer: Self-pay | Admitting: Adult Health

## 2021-10-14 ENCOUNTER — Other Ambulatory Visit: Payer: Self-pay | Admitting: Adult Health

## 2021-10-14 DIAGNOSIS — K2100 Gastro-esophageal reflux disease with esophagitis, without bleeding: Secondary | ICD-10-CM

## 2021-10-14 DIAGNOSIS — K449 Diaphragmatic hernia without obstruction or gangrene: Secondary | ICD-10-CM

## 2021-10-14 MED ORDER — PANTOPRAZOLE SODIUM 40 MG PO TBEC: DELAYED_RELEASE_TABLET | ORAL | 3 refills | Status: DC

## 2021-10-21 ENCOUNTER — Encounter: Payer: Self-pay | Admitting: Nurse Practitioner

## 2021-10-21 ENCOUNTER — Ambulatory Visit (INDEPENDENT_AMBULATORY_CARE_PROVIDER_SITE_OTHER): Payer: 59 | Admitting: Nurse Practitioner

## 2021-10-21 VITALS — BP 110/70 | HR 67 | Ht 73.0 in | Wt 229.0 lb

## 2021-10-21 DIAGNOSIS — K219 Gastro-esophageal reflux disease without esophagitis: Secondary | ICD-10-CM

## 2021-10-21 MED ORDER — PANTOPRAZOLE SODIUM 40 MG PO TBEC: DELAYED_RELEASE_TABLET | ORAL | 1 refills | Status: DC

## 2021-10-21 NOTE — Patient Instructions (Signed)
We have sent the following medication to your pharmacy for you to pick up at your convenience: ? ?Pantoprazole 40 MG capsule. Take 1 capsule thirty minutes before breakfast and dinner every day. ? ?Please purchase the following medications over the counter and take as directed:  ? ?Famotidine 20 MG tablet at bedtime as needed. ? ?Contact us in 2 weeks with an update. ? ?Gastroesophageal Reflux Disease, Adult ? ?Gastroesophageal reflux (GER) happens when acid from the stomach flows up into the tube that connects the mouth and the stomach (esophagus). Normally, food travels down the esophagus and stays in the stomach to be digested. With GER, food and stomach acid sometimes move back up into the esophagus. ?You may have a disease called gastroesophageal reflux disease (GERD) if the reflux: ?Happens often. ?Causes frequent or very bad symptoms. ?Causes problems such as damage to the esophagus. ?When this happens, the esophagus becomes sore and swollen. Over time, GERD can make small holes (ulcers) in the lining of the esophagus. ?What are the causes? ?This condition is caused by a problem with the muscle between the esophagus and the stomach. When this muscle is weak or not normal, it does not close properly to keep food and acid from coming back up from the stomach. ?The muscle can be weak because of: ?Tobacco use. ?Pregnancy. ?Having a certain type of hernia (hiatal hernia). ?Alcohol use. ?Certain foods and drinks, such as coffee, chocolate, onions, and peppermint. ?What increases the risk? ?Being overweight. ?Having a disease that affects your connective tissue. ?Taking NSAIDs, such a ibuprofen. ?What are the signs or symptoms? ?Heartburn. ?Difficult or painful swallowing. ?The feeling of having a lump in the throat. ?A bitter taste in the mouth. ?Bad breath. ?Having a lot of saliva. ?Having an upset or bloated stomach. ?Burping. ?Chest pain. Different conditions can cause chest pain. Make sure you see your doctor  if you have chest pain. ?Shortness of breath or wheezing. ?A long-term cough or a cough at night. ?Wearing away of the surface of teeth (tooth enamel). ?Weight loss. ?How is this treated? ?Making changes to your diet. ?Taking medicine. ?Having surgery. ?Treatment will depend on how bad your symptoms are. ?Follow these instructions at home: ?Eating and drinking ? ?Follow a diet as told by your doctor. You may need to avoid foods and drinks such as: ?Coffee and tea, with or without caffeine. ?Drinks that contain alcohol. ?Energy drinks and sports drinks. ?Bubbly (carbonated) drinks or sodas. ?Chocolate and cocoa. ?Peppermint and mint flavorings. ?Garlic and onions. ?Horseradish. ?Spicy and acidic foods. These include peppers, chili powder, curry powder, vinegar, hot sauces, and BBQ sauce. ?Citrus fruit juices and citrus fruits, such as oranges, lemons, and limes. ?Tomato-based foods. These include red sauce, chili, salsa, and pizza with red sauce. ?Fried and fatty foods. These include donuts, french fries, potato chips, and high-fat dressings. ?High-fat meats. These include hot dogs, rib eye steak, sausage, ham, and bacon. ?High-fat dairy items, such as whole milk, butter, and cream cheese. ?Eat small meals often. Avoid eating large meals. ?Avoid drinking large amounts of liquid with your meals. ?Avoid eating meals during the 2-3 hours before bedtime. ?Avoid lying down right after you eat. ?Do not exercise right after you eat. ?Lifestyle ? ?Do not smoke or use any products that contain nicotine or tobacco. If you need help quitting, ask your doctor. ?Try to lower your stress. If you need help doing this, ask your doctor. ?If you are overweight, lose an amount of weight that is healthy  for you. Ask your doctor about a safe weight loss goal. ?General instructions ?Pay attention to any changes in your symptoms. ?Take over-the-counter and prescription medicines only as told by your doctor. ?Do not take aspirin, ibuprofen,  or other NSAIDs unless your doctor says it is okay. ?Wear loose clothes. Do not wear anything tight around your waist. ?Raise (elevate) the head of your bed about 6 inches (15 cm). You may need to use a wedge to do this. ?Avoid bending over if this makes your symptoms worse. ?Keep all follow-up visits. ?Contact a doctor if: ?You have new symptoms. ?You lose weight and you do not know why. ?You have trouble swallowing or it hurts to swallow. ?You have wheezing or a cough that keeps happening. ?You have a hoarse voice. ?Your symptoms do not get better with treatment. ?Get help right away if: ?You have sudden pain in your arms, neck, jaw, teeth, or back. ?You suddenly feel sweaty, dizzy, or light-headed. ?You have chest pain or shortness of breath. ?You vomit and the vomit is green, yellow, or black, or it looks like blood or coffee grounds. ?You faint. ?Your poop (stool) is red, bloody, or black. ?You cannot swallow, drink, or eat. ?These symptoms may represent a serious problem that is an emergency. Do not wait to see if the symptoms will go away. Get medical help right away. Call your local emergency services (911 in the [place] Do not drive yourself to the hospital. ?Summary ?If a person has gastroesophageal reflux disease (GERD), food and stomach acid move back up into the esophagus and cause symptoms or problems such as damage to the esophagus. ?Treatment will depend on how bad your symptoms are. ?Follow a diet as told by your doctor. ?Take all medicines only as told by your doctor. ?This information is not intended to replace advice given to you by your health care provider. Make sure you discuss any questions you have with your health care provider. ?Document Revised: 12/30/2019 Document Reviewed: 12/30/2019 ?Elsevier Patient Education ? [place]. ? ?Thank you for trusting me with your gastrointestinal care!   ? ?Noralyn Pick, CRNP ? ? ? ?BMI: ? ?If you are age 69 or older, your body mass  index should be between 23-30. Your Body mass index is 30.21 kg/m?Marland Kitchen If this is out of the aforementioned range listed, please consider follow up with your Primary Care Provider. ? ?If you are age 77 or younger, your body mass index should be between 19-25. Your Body mass index is 30.21 kg/m?Marland Kitchen If this is out of the aformentioned range listed, please consider follow up with your Primary Care Provider.  ? ?MY CHART: ? ?The [facility] providers would like to encourage you to use [facility] to communicate with providers for non-urgent requests or questions.  Due to long hold times on the telephone, sending your provider a message by [facility] may be a faster and more efficient way to get a response.  Please allow 48 business hours for a response.  Please remember that this is for non-urgent requests.  ? ? ?  ? ? ? ?   ?  ? ?

## 2021-10-21 NOTE — Progress Notes (Signed)
? ? ? ?10/21/2021 ?Nathaniel Gill ?[id number] ?Dec 20, 1950 ? ? ?Chief Complaint: Acid reflux at nighttime ? ?History of Present Illness:  Nathaniel Gill is a 71 year old male with a history of hypertension, hyperlipidemia, at high risk for CAD with elevated coronary calcium score, RBBB, GI bleed at the age of 4 secondary to [place] diverticulum s/p surgical resection, GERD, duodenal adenoma and colon polyps.  He is followed by Dr. Hilarie Gill.  He presents to our office today with complaints of acid reflux at nighttime.  He describes awakening at night from a deep sleep with severe acid taste in his mouth for the past 2 months.  He denies experiencing any associated coughing or respiratory distress.  He sometimes drinks milk and he is able to go back to sleep.  He complains of constant belching.  No heartburn.  He previously took Omeprazole and his PCP switched him to Pantoprazole 40 mg once daily a few weeks ago without improvement.  He reports taking Pantoprazole a few hours after dinner around 8 PM each evening.  Takes ASA 81 mg daily.  No other NSAID use.  He drinks 1/2 cup of coffee daily.  Rare alcohol intake.  He started taking Ozempic 08/2021 due to having borderline prediabetes and to facilitate weight loss.  He also started drinking Atkins shakes for breakfast for the past 6 months.  He has lost 5 pounds.  He sleeps with the head of the bed elevated. He has intermittent abdominal bloat.  He is passing a normal brown formed stool once to twice daily.  No rectal bleeding or black stools.  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  His most recent EGD to follow-up on a duodenal adenoma was 01/21/2021 which identified a nonobstructing Schatzki's ring, a 5 cm hiatal hernia and a duodenal scar in the second/third portion of the duodenum, biopsy showed benign small bowel mucosa.  A repeat EGD in 2 years to monitor for duodenal adenomas was recommended.  His most  recent colonoscopy was 11/22/2019 which identified 2 adenomatous polyps removed from the ascending colon and 1 hyperplastic polyp rand 1 tubular adenomatous polyp removed from the descending and sigmoid colon.  He was advised to repeat a colonoscopy in 3 years. ? ?EGD 01/21/2021: ?- No gross lesions in esophagus. Z-line regular, 35 cm from the incisors. ?- Non-obstructing Schatzki ring. ?- 5 cm hiatal hernia. ?- No gross lesions in the stomach. ?- Duodenal scar in 2nd/3rd portion of duodenum. Small polypoid tissue biopsied to rule out ?recurrence. 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 ?- No other gross lesions in the entire examined duodenum ?-Recall EGD 2 years  ?A. DUODENAL SCAR SITE, BIOPSY:  ?-  Benign small bowel mucosa  ?-  No acute inflammation, villous blunting or increased intraepithelial  ?lymphocytes identified ? ?Small bowel enteroscopy 01/27/2020: ?- No gross esophageal mucosal lesions. ?- Non-obstructing Schatzki ring. ?- 3 cm hiatal hernia. ?- Congested, erythematous and granular mucosa in the posterior wall of the gastric antrum. ?Biopsied. ?- Dilated lacteal was found in the duodenum. ?- A single duodenal polyp. Resected and retrieved via mucosal resection. Clips (MR ?conditional) were placed. Tattooed area superior to polyp. ?- Otherwise, normal mucosa was found in the entire examined duodenum. ?- Normal mucosa was found in the visualized proximal jejunum. ? ?EGD 11/22/2019: ?Low-grade of narrowing Schatzki ring. Dilated to 18 mm with balloon. ?- 3 cm hiatal hernia. ?- Normal stomach. ?- A single duodenal polyp with appearance  most consistent with adenoma. ?- Duodenal mucosal lymphangiectasias. ?- No specimens collected ? ?Colonoscopy 11/22/2019: ?- Two 5 to 8 mm polyps in the ascending colon, removed with a cold snare. Resected and ?retrieved. ?- One 6 mm polyp in the descending colon, removed with a cold snare. Resected and ?retrieved. ?- One 4 mm polyp in the sigmoid colon, removed with a cold  snare. Resected and retrieved. ?- Diverticulosis in the sigmoid colon and in the descending colon. ?- Internal hemorrhoids. ?-3 year colonoscopy recall ?1. Surgical [P], colon, ascending, polyp (2) ?- TUBULAR ADENOMA, NEGATIVE FOR HIGH GRADE DYSPLASIA (X MULTIPLE). ?2. Surgical [P], colon, sigmoid and descending, polyp (2) ?- TUBULAR ADENOMA, NEGATIVE FOR HIGH GRADE DYSPLASIA (X 1). ?- HYPERPLASTIC POLYP (X MULTIPLE). ? ?Current Outpatient Medications on File Prior to Visit  ?Medication Sig Dispense Refill  ? amLODipine (NORVASC) 10 MG tablet TAKE ONE TABLET BY MOUTH DAILY FOR BLOOD PRESSURE 90 tablet 3  ? Ascorbic Acid (VITAMIN C) 1000 MG tablet Take 1,000 mg by mouth in the morning and at bedtime.    ? aspirin 81 MG tablet Take 81 mg by mouth daily.    ? BIOTIN PO Take 1 tablet by mouth daily.    ? carvedilol (COREG) 12.5 MG tablet Take 12.5 mg by mouth 2 (two) times daily with a meal.    ? ezetimibe (ZETIA) 10 MG tablet Take 1 tablet (10 mg total) by mouth daily. 90 tablet 1  ? Magnesium 400 MG TABS Take 400 mg by mouth daily.    ? olmesartan-hydrochlorothiazide (BENICAR HCT) 40-25 MG tablet TAKE ONE TABLET BY MOUTH DAILY FOR BLOOD PRESSURE 90 tablet 3  ? OVER THE COUNTER MEDICATION Place 1 spray into the nose daily as needed (congestion). Xylitol (X-Clear)    ? rosuvastatin (CRESTOR) 40 MG tablet Take  1 tablet  Daily  for Cholesterol                                                         /                                 TAKE ONE TABLET BY MOUTH 90 tablet 3  ? Semaglutide, 1 MG/DOSE, (OZEMPIC, 1 MG/DOSE,) 4 MG/3ML SOPN Inject 1 mg into the skin once a week. 9 mL 1  ? Thiamine HCl (VITAMIN B-1) 250 MG tablet Take 250 mg by mouth daily.    ? vitamin E 200 UNIT capsule Take 200 Units by mouth 2 (two) times daily.    ? Zinc 50 MG TABS Take 50 mg by mouth daily.     ? ?No current facility-administered medications on file prior to visit.  ? ?No Known Allergies ? ?Current Medications, Allergies, Past Medical History,  Past Surgical History, Family History and Social History were reviewed in [employer] record. ? ?Review of Systems:   ?Constitutional: Negative for fever, sweats, chills or weight loss.  ?Respiratory: Negative for shortness of breath.   ?Cardiovascular: Negative for chest pain, palpitations and leg swelling.  ?Gastrointestinal: See HPI.  ?Musculoskeletal: Negative for back pain or muscle aches.  ?Neurological: Negative for dizziness, headaches or paresthesias.  ? ?Physical Exam: ?There were no vitals taken for this visit. ?BP 110/70   Pulse  67   Ht '6\' 1"'$  (1.854 m)   Wt 229 lb (103.9 kg)   SpO2 94%   BMI 30.21 kg/m?   ? ?Wt Readings from Last 3 Encounters:  ?10/21/21 229 lb (103.9 kg)  ?08/18/21 234 lb 9.6 oz (106.4 kg)  ?06/18/21 228 lb (103.4 kg)  ?  ?General: 71 year old male in NAD.  ?Head: Normocephalic and atraumatic. ?Eyes: No scleral icterus. Conjunctiva pink . ?Ears: Normal auditory acuity. ?Mouth: Dentition intact. No ulcers or lesions.  ?Lungs: Clear throughout to auscultation. ?Heart: Regular rate and rhythm, no murmur. ?Abdomen: Soft, nontender and nondistended. No masses or hepatomegaly. Normal bowel sounds x 4 quadrants.  ?Rectal: Deferred.  ?Musculoskeletal: Symmetrical with no gross deformities. ?Extremities: No edema. ?Neurological: Alert oriented x 4. No focal deficits.  ?Psychological: Alert and cooperative. Normal mood and affect ? ?Assessment and Recommendations: ? ?32)  71 year old male with nighttime acid reflux x 2 months. EGD 01/21/2021 showed a non-obstructing Schatzki's ring and a 5cm hiatal hernia.  Omeprazole recently switched to Pantoprazole 40 mg daily which she was taking at 8 PM. ?-Pantoprazole '40mg'$  po bid to be taken 30 minutes before breakfast and dinner ?-May take Pepcid '20mg'$  one po QHS otc ?-GERD handout ?-Patient to call me in 2 weeks with an update, earlier if his symptoms worsen ? ?2) History of a duodenal adenoma resected with clip placed during a  small bowel enteroscopy 01/2020. A surveillance EGD 01/21/2021 showed a duodenal scar in the second/third portion of the duodenum, biopsy showed benign small bowel mucosa.   ?- EGD due 01/2023 to monitor for d

## 2021-10-22 NOTE — Progress Notes (Signed)
Addendum: Reviewed and agree with assessment and management plan. Frank Novelo M, MD  

## 2021-10-25 ENCOUNTER — Encounter: Payer: Self-pay | Admitting: Adult Health

## 2021-10-28 ENCOUNTER — Encounter: Payer: Self-pay | Admitting: Adult Health

## 2021-10-30 ENCOUNTER — Other Ambulatory Visit: Payer: Self-pay | Admitting: Cardiology

## 2021-11-23 ENCOUNTER — Ambulatory Visit: Payer: Self-pay | Admitting: Adult Health

## 2021-11-25 ENCOUNTER — Ambulatory Visit (INDEPENDENT_AMBULATORY_CARE_PROVIDER_SITE_OTHER): Payer: PRIVATE HEALTH INSURANCE | Admitting: Nurse Practitioner

## 2021-11-25 ENCOUNTER — Encounter: Payer: Self-pay | Admitting: Cardiology

## 2021-11-25 ENCOUNTER — Encounter: Payer: Self-pay | Admitting: Nurse Practitioner

## 2021-11-25 ENCOUNTER — Encounter: Payer: Self-pay | Admitting: Adult Health

## 2021-11-25 VITALS — BP 140/64 | HR 64 | Temp 97.9°F | Wt 224.4 lb

## 2021-11-25 DIAGNOSIS — M542 Cervicalgia: Secondary | ICD-10-CM | POA: Diagnosis not present

## 2021-11-25 DIAGNOSIS — I10 Essential (primary) hypertension: Secondary | ICD-10-CM

## 2021-11-25 DIAGNOSIS — R55 Syncope and collapse: Secondary | ICD-10-CM | POA: Diagnosis not present
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
## 2021-11-25 NOTE — Telephone Encounter (Signed)
I spoke with patient to inform him of the carotid doppler study. He then told  me that since we spoke earlier, he went to Dr. Wayland Denis who ordered the same carotid doppler study. Patient will have it done tomorrow 5/26 at [facility]. He also went to [facility] for neck x-ray, which he stated showed degenerative disease. Thank you. Thank you.

## 2021-11-25 NOTE — Telephone Encounter (Signed)
Hard to know what happened.  It is possible that by rubbing his neck, he essentially performed a carotid massage which led to a vagal response.  That is the most likely scenario.  It does not necessarily mean that he has any carotid artery disease.  For the sake of peace of mind, it is reasonable to consider carotid artery Dopplers just to confirm stenosis.  Diagnosis would be near syncope.   [name], MD
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
## 2021-11-26 ENCOUNTER — Ambulatory Visit (HOSPITAL_COMMUNITY)
Admission: RE | Admit: 2021-11-26 | Discharge: 2021-11-26 | Disposition: A | Discharge status: Home / Self Care | Payer: 59 | Source: Ambulatory Visit | Attending: Nurse Practitioner | Admitting: Nurse Practitioner

## 2021-11-26 DIAGNOSIS — R55 Syncope and collapse: Secondary | ICD-10-CM | POA: Diagnosis not present

## 2021-11-26 NOTE — Progress Notes (Unsigned)
ANNUAL WELLNESS VISIT AND FOLLOW UP   Assessment and Plan:  Diagnoses and all orders for this visit:  Annual Medicare Wellness Visit Due annually  Health maintenance reviewed ***  Atherosclerosis of aorta ([place]) Per imaging, CXR 07/2018 Control blood pressure, cholesterol, glucose, increase exercise.   Coronary calcium score over 400 He is established with cardiology LDL goal at minimum <70, ideally <55  On rosuvastatin 40 mg; discussed repatha vs addition of zetia for goal; he would like to proceed with zetia - script sent, recheck in 3 months.  Take daily ASA 81 mg Reminder to go to the ER if any CP, SOB, nausea, dizziness, severe HA, changes vision/speech, left arm numbness and tingling and jaw pain.  Resistant hypertension Controlled on current medications Monitor blood pressure at home; call if consistently over 130/80 Continue DASH diet.   Reminder to go to the ER if any CP, SOB, nausea, dizziness, severe HA, changes vision/speech, left arm numbness and tingling and jaw pain.  RBBB (right bundle branch block) Monitor, followed by cardiology  Systolic murmur Normal ECHO Followed by cardiology  T2DM [facility])  Education: Reviewed 'ABCs' of diabetes management (respective goals in parentheses):  A1C (<7), blood pressure (<130/80), and cholesterol (LDL <70) Eye Exam yearly and Dental Exam every 6 months.- diabetes eye exam report requested once complete Dietary recommendations Physical Activity recommendations Aggressive control due to high CCC - discussed and sent in ozempic starting dose to try. If tolerating 0.5 mg well contact office for 1 mg/week script - A1C  Hyperlipidemia associated with T2DM ([place]) Continue medications - has been well controlled in past with rosuvastatin  LDL goal <70; reviewed low saturated fat, increase soluble fiber  Continue weight management and exercise.  - Lipid panel  Erectile dysfunction associated with T2DM ([facility])  Control blood  pressure, cholesterol, glucose, increase exercise.  Declines med at this time  CKD II associated with T2DM ([facility]) Increase fluids, avoid NSAIDS, monitor sugars, will monitor - CMP/GFR  Obesity- BMI 30 ***  Long discussion about weight loss, diet, and exercise Recommended diet heavy in fruits and veggies and low in animal meats, cheeses, and dairy products, appropriate calorie intake Discussed appropriate weight for height Off of phentermine - limited benefit  Follow up at next visit  Medication management CBC, CMP/GFR, magnesium  Gastroesophageal reflux disease Well managed on current medications Discussed diet, avoiding triggers and other lifestyle changes  Hiatal hernia Small regular meals, PPI indefinitely as recommended by GI  S/p dilation esophageal stricture S/p dilation, continue PPI Denies recurrent sx; he prefers to defer repeat unless symptomatic  Duodenal polyp S/p resection; GI recommended 1 year follow up due 01/2022 ***  Colon polyps Recommended 3 year recall, due 2024  Vitamin D deficiency Continue supplementation Defer vitamin D level  Arthritis/decenerative disk Continue follow up with ortho, PRN gabapentin Discussed cardiac risks with oral nsaids - tylenol, topicals encouraged   No orders of the defined types were placed in this encounter.   Discussed med's effects and SE's. Screening labs and tests as requested with regular follow-up as recommended. Over 45 minutes of exam, counseling, chart review, coordination of care and critical decision making was performed for establishment of new patient with complete physical.   Future Appointments  Date Time Provider [place]  11/26/2021  4:00 PM [facility] - [place] [facility]  11/30/2021  9:00 AM Liane Comber, NP [facility] None  06/16/2022 10:00 AM Liane Comber, NP [facility] None  01/03/2023  9:30 AM Darrol Jump,  NP [facility] None    Plan:   During the course of the visit the  patient was educated and counseled about appropriate screening and preventive services including:   Pneumococcal vaccine  Prevnar 13 Influenza vaccine Td vaccine Screening electrocardiogram Bone densitometry screening Colorectal cancer screening Diabetes screening Glaucoma screening Nutrition counseling  Advanced directives: requested  HPI There were no vitals taken for this visit.  The patient is a very pleasant 71 y.o., presents for AWV and 3MOV. He has Resistant hypertension; Hyperlipidemia associated with type 2 diabetes mellitus ([place]); Degenerative lumbar disc; RBBB (right bundle branch block); Type 2 diabetes mellitus ([place]); Acid reflux; Vitamin D deficiency; Medication management; Erectile dysfunction associated with type 2 diabetes mellitus ([place]); Systolic murmur; Obesity (BMI 30.0-34.9); Atherosclerosis of aorta ([place]); CKD stage 2 due to type 2 diabetes mellitus ([place]); Hiatal hernia; History of adenomatous polyp of colon; Polyp of duodenum; S/P dilatation of esophageal stricture; Agatston coronary artery calcium score greater than 400 (1036 on 07/10/4942); and Metabolic syndrome on their problem list.  *** first medicare?   Recently had neck pain while driving, was running back of neck but blacked out (luckily came to a stop with foot on break). No notable sx prior, no seizure like activity. Carotid dopplers were ordered ***   He is married. First grandson born November 2022.  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   He follows with [facility] Levy Pupa for multiple ortho complaints. Has had lumbar pain, degenerative disk with intermittent R radicular sx, has had injection with good results, also benefits from PRN gabapentin 300 mg, advil/aleve rarely.    He has a diagnosis of GERD, He had colonoscopy and EGD by Dr. Hilarie Fredrickson on 11/22/2019 which  showed 3 cm hital hernia, low-grade of narrowing schatzki ring (dilated), adenomaous duodenal polyp underwent resection on 01/27/2020 by Dr. Rush Landmark, had another EGD 01/2021, recommended 1 year recall due to adnomatous polyp. Colonoscopy showed several adenomatous polyps recommended for 3 year follow up. Continue omeprazole 40 mg daily, and reports has elevated HOB 2 inches with fully resolved GERD.   BMI is There is no height or weight on file to calculate BMI., he has been working on diet and exercise, walking 5-6 miles daily, does weights. Stopped phentermine due to lack of sufficient perceived benefit.  Fat % at home around 18%, notes was losing inches at waist, currently 36.5.  Doing high protein diet,  Reports was down to 225 lb prior to the holidays He reports sleeps well, getting 7+ hours daily, wakes up feeling rested.  Wt Readings from Last 3 Encounters:  11/25/21 224 lb 6.4 oz (101.8 kg)  10/21/21 229 lb (103.9 kg)  08/18/21 234 lb 9.6 oz (106.4 kg)   He has atherosclerosis of aorta noted on imaging (CXR 07/2018). He recently had Ct coronary calcium showing 3 vessel CAD with total coronary calcium score of 1036.   His blood pressure has been controlled at home, followed by Dr. Ellyn Hack annually, today their BP is    He had essentially normal ECHO in 07/2017 obtained for murmur evaluation; attributed to mild septal thickening.   He does workout. He denies chest pain, shortness of breath, dizziness.   He is on cholesterol medication (rosuvastatin 40 mg daily) and denies myalgias. His cholesterol is not at goal of LDL <70. The cholesterol last visit was:   Lab Results  Component Value Date   CHOL 163 06/16/2021   HDL 42 06/16/2021   LDLCALC 98 06/16/2021   TRIG 126 06/16/2021   CHOLHDL 3.9 06/16/2021    He has been working on diet and exercise for T2DM managed by lifestyle (6.5% 04/2018, 6.6% 02/2019), he is on ASA, ACEi, statin, metformin 500 mg BID and denies foot ulcerations,  increased appetite, nausea, paresthesia of the feet, polydipsia, polyuria and visual disturbances. He does have erectile dysfunction, but not recently active, has declined sildenafil. Doesn't have glucometer, has always been well controlled.  Last A1C in the office was:  Lab Results  Component Value Date   HGBA1C 6.4 (H) 06/16/2021    He has stable CKD II associated with T2DM monitored via this office:  Lab Results  Component Value Date   GFRNONAA 89 11/23/2020   Patient is on Vitamin D supplement, ? 4000 IU daily Lab Results  Component Value Date   VD25OH 45 06/16/2021     He denies LUTS or nocturia.  Lab Results  Component Value Date   PSA 1.15 06/16/2021   PSA 1.19 06/16/2020   PSA 1.6 05/20/2019    No results found for: IRON, TIBC, FERRITIN Lab Results  Component Value Date   [id number] 253 06/16/2021   - he reports started on oral supplement with perceived memory improvement.    Current Medications:  Current Outpatient Medications on File Prior to Visit  Medication Sig Dispense Refill   amLODipine (NORVASC) 10 MG tablet TAKE ONE TABLET BY MOUTH DAILY FOR BLOOD PRESSURE 90 tablet 3   Ascorbic Acid (VITAMIN C) 1000 MG tablet Take 1,000 mg by mouth in the morning and at bedtime.     aspirin 81 MG tablet Take 81 mg by mouth daily.     BIOTIN PO Take 1 tablet by mouth daily.     carvedilol (COREG) 12.5 MG tablet TAKE ONE TABLET BY MOUTH TWICE A DAY 180 tablet 1   ezetimibe (ZETIA) 10 MG tablet Take 1 tablet (10 mg total) by mouth daily. 90 tablet 1   Magnesium 400 MG TABS Take 400 mg by mouth daily.     olmesartan-hydrochlorothiazide (BENICAR HCT) 40-25 MG tablet TAKE ONE TABLET BY MOUTH DAILY FOR BLOOD PRESSURE 90 tablet 3   pantoprazole (PROTONIX) 40 MG tablet Take 30 minutes before breakfast and 30 minutes before dinner every day. 60 tablet 1   rosuvastatin (CRESTOR) 40 MG tablet Take  1 tablet  Daily  for Cholesterol                                                          /                                 TAKE ONE TABLET BY MOUTH 90 tablet 3   Semaglutide, 1 MG/DOSE, (OZEMPIC, 1 MG/DOSE,) 4 MG/3ML SOPN Inject 1 mg into the skin once a week. 9 mL 1   Thiamine HCl (VITAMIN B-1) 250 MG tablet Take 250 mg by mouth daily.     vitamin E 200 UNIT capsule Take 200 Units by mouth 2 (two) times daily.     Zinc 50 MG TABS Take 50 mg by mouth daily.      No current  facility-administered medications on file prior to visit.   Allergies:  No Known Allergies Health Maintenance:  Immunization History  Administered Date(s) Administered   Influenza Split 08/28/2007   Pneumococcal Conjugate-13 04/16/2018   Pneumococcal Polysaccharide-23 05/20/2019   Tdap 11/19/2010   Health Maintenance  Topic Date Due   COVID-19 Vaccine (1) Never done   Zoster Vaccines- Shingrix (1 of 2) Never done   OPHTHALMOLOGY EXAM  07/28/2021   HEMOGLOBIN A1C  12/15/2021   FOOT EXAM  06/16/2022   TETANUS/TDAP  07/04/2022   COLONOSCOPY (Pts 45-34yr Insurance coverage will need to be confirmed)  11/22/2022   Pneumonia Vaccine 71 Years old  Completed   Hepatitis C Screening  Completed   HPV VACCINES  Aged Out   INFLUENZA VACCINE  Discontinued     Tetanus: 2014 with laceration Pneumovax: 2020 Prevnar 13: 2019 Flu vaccine: declines Shingrix: declines Covid 19: declines  ECHO: 07/2017 normal   Colonoscopy: 01/2020 - Dr. PHilarie Fredrickson 3 year recall  EGD: 01/2021 - adenomatous duodenal polyp, 1 year recall, Dr. MAllison Quarry [facility], 07/28/2020 no retinopathy  Dentist: Dr. AArnell Sie[place], last visit 2022, goes 673mPatient Care Team: McUnk PintoMD as PCP - General (Internal Medicine) HaLeonie ManMD as PCP - Cardiology (Cardiology) Mansouraty, GaTelford Nab MD as Consulting Physician (Gastroenterology)  Medical History:  has Resistant hypertension; Hyperlipidemia associated with type 2 diabetes mellitus ([facility] Degenerative lumbar disc; RBBB (right bundle  branch block); Type 2 diabetes mellitus ([facility] Acid reflux; Vitamin D deficiency; Medication management; Erectile dysfunction associated with type 2 diabetes mellitus ([facility] Systolic murmur; Obesity (BMI 30.0-34.9); Atherosclerosis of aorta ([facility] CKD stage 2 due to type 2 diabetes mellitus ([facility] Hiatal hernia; History of adenomatous polyp of colon; Polyp of duodenum; S/P dilatation of esophageal stricture; Agatston coronary artery calcium score greater than 400 (1036 on 1216/0/7371 and Metabolic syndrome on their problem list. Surgical History:  He  has a past surgical history that includes Cardiac catheterization (2005); Abdominal surgery (1956); Stapedectomy (Bilateral); Trigger finger release (Right, 2014); Cyst removal hand (Left, 1975); Skin lesion excision; transthoracic echocardiogram (07/2017); Colonoscopy; Upper gi endoscopy; Wisdom tooth extraction; Endoscopic mucosal resection (N/A, 01/27/2020); biopsy (01/27/2020); Submucosal lifting injection (01/27/2020); Submucosal tattoo injection (01/27/2020); Hemostasis clip placement (01/27/2020); enteroscopy (N/A, 01/27/2020); Carpal tunnel release (Left, 03/06/2020); Cochlear implant (Bilateral); Esophagogastroduodenoscopy (egd) with propofol (N/A, 01/21/2021); and biopsy (01/21/2021). Family History:  His family history includes Arrhythmia in his brother; COPD in his mother; Heart attack in his brother; Heart disease in his mother; Heart failure in his mother; Hyperlipidemia in his brother and brother; Hypertension in his brother and brother; Kidney disease in his father. Social History:   reports that he has never smoked. He has never used smokeless tobacco. He reports current alcohol use. He reports that he does not use drugs.   MEDICARE WELLNESS OBJECTIVES: Physical activity:   Cardiac risk factors:   Depression/mood screen:      06/16/2021   10:36 AM  Depression screen PHQ 2/9  Decreased Interest 0  Down, Depressed, Hopeless 0  PHQ - 2  Score 0    ADLs:      View : No data to display.           Cognitive Testing  Alert? Yes  Normal Appearance?Yes  Oriented to person? Yes  Place? Yes   Time? Yes  Recall of three objects?  Yes  Can perform simple calculations? Yes  Displays appropriate judgment?Yes  Can read  the correct time from a watch face?Yes  EOL planning:       Review of Systems:  Review of Systems  Constitutional:  Negative for malaise/fatigue and weight loss.  HENT:  Negative for hearing loss and tinnitus.   Eyes:  Negative for blurred vision and double vision.  Respiratory:  Negative for cough, sputum production, shortness of breath and wheezing.   Cardiovascular:  Negative for chest pain, palpitations, orthopnea, claudication, leg swelling and PND.  Gastrointestinal:  Negative for abdominal pain, blood in stool, constipation, diarrhea, heartburn, melena, nausea and vomiting.  Genitourinary: Negative.   Musculoskeletal:  Negative for falls, joint pain and myalgias.  Skin:  Negative for rash.  Neurological:  Negative for dizziness, tingling, sensory change, weakness and headaches.  Endo/Heme/Allergies:  Negative for polydipsia.  Psychiatric/Behavioral: Negative.  Negative for depression, memory loss, substance abuse and suicidal ideas. The patient is not nervous/anxious and does not have insomnia.   All other systems reviewed and are negative.  Physical Exam: Estimated body mass index is 29.61 kg/m as calculated from the following:   Height as of 10/21/21: '6\' 1"'$  (1.854 m).   Weight as of 11/25/21: 224 lb 6.4 oz (101.8 kg). There were no vitals taken for this visit.   General Appearance: Well nourished, in no apparent distress.  Eyes: PERRLA, EOMs, conjunctiva no swelling or erythema, fundal exam deferred to ophth Sinuses: No Frontal/maxillary tenderness  ENT/Mouth: Ext aud canals clear, normal light reflex with TMs without erythema, bulging. Good dentition. No erythema, swelling, or exudate on  post pharynx. Tonsils not swollen or erythematous. Hearing normal.  Neck: Supple, thyroid normal. No bruits  Respiratory: Respiratory effort normal, BS equal bilaterally without rales, rhonchi, wheezing or stridor.  Cardio: RR, bradycardic, without murmurs, rubs or gallops. Brisk peripheral pulses without edema.  Chest: symmetric, with normal excursions and percussion.  Abdomen: Soft, nontender, no guarding, rebound, hernias, masses, or organomegaly.  Lymphatics: Non tender without lymphadenopathy.  Genitourinary: Declines, no concerns Musculoskeletal: Full ROM all peripheral extremities,5/5 strength, and normal gait. Bony growth/deformity noted to superior aspect of bil MCP joint of 1st digit, ROM intact, without signs of acute inflammation.  Skin: Warm, dry without rashes, ecchymosis. Multiple nevi to back; benign appearing, all <6 mm  Neuro: Cranial nerves intact, reflexes equal bilaterally. Normal muscle tone, no cerebellar symptoms. Sensation intact.  Psych: Awake and oriented X 3, normal affect, Insight and Judgment appropriate.   EKG: ***    Medicare Attestation I have personally reviewed: The patient's medical and social history Their use of alcohol, tobacco or illicit drugs Their current medications and supplements The patient's functional ability including ADLs,fall risks, home safety risks, cognitive, and hearing and visual impairment Diet and physical activities Evidence for depression or mood disorders  The patient's weight, height, BMI, and visual acuity have been recorded in the chart.  I have made referrals, counseling, and provided education to the patient based on review of the above and I have provided the patient with a written personalized care plan for preventive services.     Izora Ribas, NP 12:53 PM [facility]

## 2021-11-30 ENCOUNTER — Encounter: Payer: Self-pay | Admitting: Adult Health

## 2021-11-30 ENCOUNTER — Ambulatory Visit (INDEPENDENT_AMBULATORY_CARE_PROVIDER_SITE_OTHER): Payer: Medicare Other | Admitting: Adult Health

## 2021-11-30 VITALS — BP 104/60 | HR 75 | Temp 97.5°F | Ht 73.0 in | Wt 222.0 lb

## 2021-11-30 DIAGNOSIS — Z0000 Encounter for general adult medical examination without abnormal findings: Secondary | ICD-10-CM

## 2021-11-30 DIAGNOSIS — Z0001 Encounter for general adult medical examination with abnormal findings: Secondary | ICD-10-CM

## 2021-11-30 DIAGNOSIS — K317 Polyp of stomach and duodenum: Secondary | ICD-10-CM

## 2021-11-30 DIAGNOSIS — R011 Cardiac murmur, unspecified: Secondary | ICD-10-CM

## 2021-11-30 DIAGNOSIS — E1169 Type 2 diabetes mellitus with other specified complication: Secondary | ICD-10-CM

## 2021-11-30 DIAGNOSIS — Z9889 Other specified postprocedural states: Secondary | ICD-10-CM

## 2021-11-30 DIAGNOSIS — E785 Hyperlipidemia, unspecified: Secondary | ICD-10-CM

## 2021-11-30 DIAGNOSIS — Z79899 Other long term (current) drug therapy: Secondary | ICD-10-CM

## 2021-11-30 DIAGNOSIS — I700 Atherosclerosis of aorta: Secondary | ICD-10-CM

## 2021-11-30 DIAGNOSIS — E1122 Type 2 diabetes mellitus with diabetic chronic kidney disease: Secondary | ICD-10-CM | POA: Diagnosis not present

## 2021-11-30 DIAGNOSIS — Z8719 Personal history of other diseases of the digestive system: Secondary | ICD-10-CM

## 2021-11-30 DIAGNOSIS — N521 Erectile dysfunction due to diseases classified elsewhere: Secondary | ICD-10-CM

## 2021-11-30 DIAGNOSIS — Z86010 Personal history of colonic polyps: Secondary | ICD-10-CM

## 2021-11-30 DIAGNOSIS — R6889 Other general symptoms and signs: Secondary | ICD-10-CM | POA: Diagnosis not present

## 2021-11-30 DIAGNOSIS — R931 Abnormal findings on diagnostic imaging of heart and coronary circulation: Secondary | ICD-10-CM

## 2021-11-30 DIAGNOSIS — N182 Chronic kidney disease, stage 2 (mild): Secondary | ICD-10-CM

## 2021-11-30 DIAGNOSIS — E663 Overweight: Secondary | ICD-10-CM

## 2021-11-30 DIAGNOSIS — E559 Vitamin D deficiency, unspecified: Secondary | ICD-10-CM

## 2021-11-30 DIAGNOSIS — I4510 Unspecified right bundle-branch block: Secondary | ICD-10-CM

## 2021-11-30 DIAGNOSIS — I10 Essential (primary) hypertension: Secondary | ICD-10-CM | POA: Diagnosis not present

## 2021-11-30 DIAGNOSIS — E669 Obesity, unspecified: Secondary | ICD-10-CM

## 2021-11-30 DIAGNOSIS — K2100 Gastro-esophageal reflux disease with esophagitis, without bleeding: Secondary | ICD-10-CM

## 2021-11-30 DIAGNOSIS — K449 Diaphragmatic hernia without obstruction or gangrene: Secondary | ICD-10-CM

## 2021-12-01 DIAGNOSIS — M542 Cervicalgia: Secondary | ICD-10-CM | POA: Diagnosis not present

## 2021-12-01 LAB — CBC WITH DIFFERENTIAL/PLATELET
Absolute Monocytes: 725 cells/uL (ref 200–950)
Basophils Absolute: 89 cells/uL (ref 0–200)
Basophils Relative: 1.2 %
Eosinophils Absolute: 385 cells/uL (ref 15–500)
Eosinophils Relative: 5.2 %
HCT: 42.4 % (ref 38.5–50.0)
Hemoglobin: 14.2 g/dL (ref 13.2–17.1)
Lymphs Abs: 1495 cells/uL (ref 850–3900)
MCH: 29.5 pg (ref 27.0–33.0)
MCHC: 33.5 g/dL (ref 32.0–36.0)
MCV: 88.1 fL (ref 80.0–100.0)
MPV: 9.4 fL (ref 7.5–12.5)
Monocytes Relative: 9.8 %
Neutro Abs: 4706 cells/uL (ref 1500–7800)
Neutrophils Relative %: 63.6 %
Platelets: 289 10*3/uL (ref 140–400)
RBC: 4.81 10*6/uL (ref 4.20–5.80)
RDW: 13.3 % (ref 11.0–15.0)
Total Lymphocyte: 20.2 %
WBC: 7.4 10*3/uL (ref 3.8–10.8)

## 2021-12-01 LAB — COMPLETE METABOLIC PANEL WITH GFR
AG Ratio: 2 (calc) (ref 1.0–2.5)
ALT: 36 U/L (ref 9–46)
AST: 27 U/L (ref 10–35)
Albumin: 4.7 g/dL (ref 3.6–5.1)
Alkaline phosphatase (APISO): 35 U/L (ref 35–144)
BUN: 20 mg/dL (ref 7–25)
CO2: 26 mmol/L (ref 20–32)
Calcium: 10.3 mg/dL (ref 8.6–10.3)
Chloride: 101 mmol/L (ref 98–110)
Creat: 0.93 mg/dL (ref 0.70–1.35)
Globulin: 2.3 g/dL (calc) (ref 1.9–3.7)
Glucose, Bld: 110 mg/dL — ABNORMAL HIGH (ref 65–99)
Potassium: 4.2 mmol/L (ref 3.5–5.3)
Sodium: 138 mmol/L (ref 135–146)
Total Bilirubin: 0.4 mg/dL (ref 0.2–1.2)
Total Protein: 7 g/dL (ref 6.1–8.1)
eGFR: 89 mL/min/{1.73_m2} (ref >=60)

## 2021-12-01 LAB — HEMOGLOBIN A1C
Hgb A1c MFr Bld: 6 % of total Hgb — ABNORMAL HIGH (ref <5.7)
Mean Plasma Glucose: 126 mg/dL
eAG (mmol/L): 7 mmol/L

## 2021-12-01 LAB — MAGNESIUM: Magnesium: 2.2 mg/dL (ref 1.5–2.5)

## 2021-12-01 LAB — LIPID PANEL
Cholesterol: 125 mg/dL (ref <200)
HDL: 39 mg/dL — ABNORMAL LOW (ref >=40)
LDL Cholesterol (Calc): 65 mg/dL (calc)
Non-HDL Cholesterol (Calc): 86 mg/dL (calc) (ref <130)
Total CHOL/HDL Ratio: 3.2 (calc) (ref <5.0)
Triglycerides: 131 mg/dL (ref <150)

## 2021-12-01 LAB — TSH: TSH: 3.54 mIU/L (ref 0.40–4.50)

## 2021-12-07 DIAGNOSIS — M542 Cervicalgia: Secondary | ICD-10-CM | POA: Diagnosis not present

## 2021-12-09 ENCOUNTER — Other Ambulatory Visit: Payer: Self-pay | Admitting: Adult Health

## 2021-12-12 ENCOUNTER — Encounter: Payer: Self-pay | Admitting: Nurse Practitioner

## 2021-12-20 DIAGNOSIS — Z006 Encounter for examination for normal comparison and control in clinical research program: Secondary | ICD-10-CM

## 2021-12-20 NOTE — Research (Unsigned)
Spoke with patient regarding V1P study. Verified e-mail on file is correct e-mail address. Copy of ICF e-mailed to patient for review. Will follow up with patient after reviewing ICF to see about moving forward with screening visit.

## 2021-12-24 DIAGNOSIS — M542 Cervicalgia: Secondary | ICD-10-CM | POA: Diagnosis not present

## 2021-12-27 ENCOUNTER — Encounter: Payer: Self-pay | Admitting: Adult Health

## 2021-12-28 ENCOUNTER — Ambulatory Visit (INDEPENDENT_AMBULATORY_CARE_PROVIDER_SITE_OTHER): Payer: Medicare Other | Admitting: Nurse Practitioner

## 2021-12-28 ENCOUNTER — Encounter: Payer: Self-pay | Admitting: Nurse Practitioner

## 2021-12-28 VITALS — BP 138/83 | HR 71 | Temp 97.5°F | Wt 225.8 lb

## 2021-12-28 DIAGNOSIS — E559 Vitamin D deficiency, unspecified: Secondary | ICD-10-CM

## 2021-12-28 DIAGNOSIS — R400 Somnolence: Secondary | ICD-10-CM

## 2021-12-28 DIAGNOSIS — E8881 Metabolic syndrome: Secondary | ICD-10-CM

## 2021-12-28 DIAGNOSIS — U099 Post covid-19 condition, unspecified: Secondary | ICD-10-CM

## 2021-12-28 DIAGNOSIS — E538 Deficiency of other specified B group vitamins: Secondary | ICD-10-CM

## 2021-12-28 DIAGNOSIS — R5382 Chronic fatigue, unspecified: Secondary | ICD-10-CM | POA: Diagnosis not present

## 2021-12-28 NOTE — Progress Notes (Signed)
Assessment and Plan:  Nathaniel Gill was seen today for an episodic visit.  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 Continue to monitor  - CBC with Differential/Platelet - Sed Rate (ESR) - C-reactive protein - COMPLETE METABOLIC PANEL WITH GFR  2. Chronic fatigue  - CBC with Differential/Platelet - VITAMIN D 25 Hydroxy (Vit-D Deficiency, Fractures) - Vitamin B12 - Sed Rate (ESR) - C-reactive protein  3. Daytime sleepiness Continue nose strip.  - CBC with Differential/Platelet - VITAMIN D 25 Hydroxy (Vit-D Deficiency, Fractures) - Vitamin B12 - Sed Rate (ESR) - C-reactive protein - Hemoglobin A1c - COMPLETE METABOLIC PANEL WITH GFR  4. Vitamin D deficiency  - VITAMIN D 25 Hydroxy (Vit-D Deficiency, Fractures)  5. Vitamin B 12 deficiency  - Vitamin B12  6. Metabolic syndrome  - CBC with Differential/Platelet - VITAMIN D 25 Hydroxy (Vit-D Deficiency, Fractures) - Vitamin B12 - Sed Rate (ESR) - C-reactive protein - Hemoglobin A1c - COMPLETE METABOLIC PANEL WITH GFR  Notify office for further evaluation and treatment, questions or concerns if s/s fail to improve. The risks and benefits of my recommendations, as well as other treatment options were discussed with the patient today. Questions were answered.  Further disposition pending results of labs. Discussed med's effects and SE's.    Over 20 minutes of exam, counseling, chart review, and critical decision making was performed.   Future Appointments  Date Time Provider [place]  03/03/2022  9:30 AM Darrol Jump, NP [facility] None  06/16/2022 10:00 AM Darrol Jump, NP [facility] None  01/03/2023  9:30 AM Mykenzie Ebanks, Kenney Houseman, NP [facility] None    ------------------------------------------------------------------------------------------------------------------   HPI BP 138/83   Pulse 71   Temp (!) 97.5 F (36.4 C)   Wt 225 lb 12.8 oz (102.4 kg)   SpO2  97%   BMI 29.79 kg/m   71 y.o.male presents for reports having Covid early part of June 2023.  Sysmtpoms were mild and included nasal congestin and cough.   The last time he had it was 2020 and was short term with similar symptoms.  He has taken OTC sinus meds the entire time with effectiveness.  While his head cold has improved, he continues to feel extreme fatigue including falling asleep at his desk while working. He now has to force himself to work out with [employer].   He has recently stopped Viagra.  He has been on Ozempic for several months.  Feels as though his muscle mass has changed and legs are thinner.  Denis any stomach or bowel issues.    He does not have a CPAP but wears a nose strip at night for snorning.    He gives blood on a regular basis.  Past Medical History:  Diagnosis Date   Arthritis    CAD (coronary artery disease) 07/13/2021   Cor Ca++ Score 837.  Mixed mod plaque prox RCA(50 to 69%).  Mild Ca++ plaque mid RCA (25-49%).  Minimal Ca++ plaque prox LCx (0-24%).  Mid LCx CA++ plaque (50 to 69%), Prox OM1 CA++ plaque (50-69%).  Prox LAD mild to 0-24%. LM  mild 0-24% Ca++  plaque. => CT FFR + for distal LCx (small caliber)  Negative findings in the LAD, OM1 and  and RCA. =>  Would not recommend catheterization.   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 Hilarie Fredrickson:  Low-grade of narrowing Schatzki ring. Dilated to 18 mm with balloon. - 3 cm hiatal hernia. - Normal stomach. 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    GERD (gastroesophageal reflux disease)    Hiatal hernia    History of colon polyps    Hyperlipidemia    Hypertension    No sign of renal artery stenosis   IBS (irritable bowel syndrome)    RBBB    Systolic murmur    no  current problems    Vertigo    Vitamin D deficiency      No Known Allergies  Current Outpatient Medications on File Prior to Visit  Medication Sig   amLODipine (NORVASC) 10 MG tablet TAKE ONE TABLET BY MOUTH DAILY FOR BLOOD PRESSURE   Ascorbic Acid (VITAMIN C) 1000 MG tablet Take 1,000 mg by mouth in the morning and at bedtime.   aspirin 81 MG tablet Take 81 mg by mouth daily.   BIOTIN PO Take 1 tablet by mouth daily.   carvedilol (COREG) 12.5 MG tablet TAKE ONE TABLET BY MOUTH TWICE A DAY   ezetimibe (ZETIA) 10 MG tablet TAKE ONE TABLET BY MOUTH DAILY   Magnesium 400 MG TABS Take 400 mg by mouth daily.   olmesartan-hydrochlorothiazide (BENICAR HCT) 40-25 MG tablet TAKE ONE TABLET BY MOUTH DAILY FOR BLOOD PRESSURE   pantoprazole (PROTONIX) 40 MG tablet Take 30 minutes before breakfast and 30 minutes before dinner every day.   rosuvastatin (CRESTOR) 40 MG tablet Take  1 tablet  Daily  for Cholesterol                                                         /                                 TAKE ONE TABLET BY MOUTH   Semaglutide, 1 MG/DOSE, (OZEMPIC, 1 MG/DOSE,) 4 MG/3ML SOPN Inject 1 mg into the skin once a week.   Thiamine HCl (VITAMIN B-1) 250 MG tablet Take 250 mg by mouth daily.   vitamin E 200 UNIT capsule Take 200 Units by mouth 2 (two) times daily.   Zinc 50 MG TABS Take 50 mg by mouth daily.    No current facility-administered medications on file prior to visit.    ROS: all negative except what is noted in the HPI.    Physical Exam:  BP 138/83   Pulse 71   Temp (!) 97.5 F (36.4 C)   Wt 225 lb 12.8 oz (102.4 kg)   SpO2 97%   BMI 29.79 kg/m   General Appearance: NAD.  Awake, conversant and cooperative. Eyes: PERRLA, EOMs intact.  Sclera white.  Conjunctiva without erythema. Sinuses: No frontal/maxillary tenderness.  No nasal discharge. Nares patent.  ENT/Mouth: Ext aud canals clear.  Bilateral TMs w/DOL and without erythema or bulging. Hearing intact.  Posterior pharynx without swelling  or exudate.  Tonsils without swelling or erythema.  Neck: Supple.  No masses, nodules or thyromegaly. Respiratory: Effort is regular with non-labored breathing. Breath sounds are equal bilaterally without rales, rhonchi, wheezing or stridor.  Cardio: RRR with no MRGs. Brisk peripheral pulses without edema.  Abdomen: Active BS in all four quadrants.  Soft and non-tender without guarding, rebound tenderness, hernias or masses. Lymphatics: Non tender without lymphadenopathy.  Musculoskeletal: Full ROM, 5/5 strength, normal ambulation.  No clubbing or cyanosis. Skin: Appropriate color for ethnicity. Warm without rashes, lesions, ecchymosis, ulcers.  Neuro: CN II-XII grossly normal. Normal muscle tone without cerebellar symptoms and intact sensation.   Psych: AO X 3,  appropriate mood and affect, insight and judgment.     Darrol Jump, NP 11:32 AM [facility]

## 2021-12-29 ENCOUNTER — Encounter: Payer: Self-pay | Admitting: Nurse Practitioner

## 2021-12-29 LAB — VITAMIN B12: Vitamin B-12: 1128 pg/mL — ABNORMAL HIGH (ref 200–1100)

## 2021-12-29 LAB — CBC WITH DIFFERENTIAL/PLATELET
Absolute Monocytes: 738 cells/uL (ref 200–950)
Basophils Absolute: 72 cells/uL (ref 0–200)
Basophils Relative: 1.2 %
Eosinophils Absolute: 456 cells/uL (ref 15–500)
Eosinophils Relative: 7.6 %
HCT: 41.6 % (ref 38.5–50.0)
Hemoglobin: 13.8 g/dL (ref 13.2–17.1)
Lymphs Abs: 1236 cells/uL (ref 850–3900)
MCH: 29.2 pg (ref 27.0–33.0)
MCHC: 33.2 g/dL (ref 32.0–36.0)
MCV: 88.1 fL (ref 80.0–100.0)
MPV: 9.3 fL (ref 7.5–12.5)
Monocytes Relative: 12.3 %
Neutro Abs: 3498 cells/uL (ref 1500–7800)
Neutrophils Relative %: 58.3 %
Platelets: 382 10*3/uL (ref 140–400)
RBC: 4.72 10*6/uL (ref 4.20–5.80)
RDW: 13.6 % (ref 11.0–15.0)
Total Lymphocyte: 20.6 %
WBC: 6 10*3/uL (ref 3.8–10.8)

## 2021-12-29 LAB — COMPLETE METABOLIC PANEL WITH GFR
AG Ratio: 1.9 (calc) (ref 1.0–2.5)
ALT: 28 U/L (ref 9–46)
AST: 25 U/L (ref 10–35)
Albumin: 4.4 g/dL (ref 3.6–5.1)
Alkaline phosphatase (APISO): 37 U/L (ref 35–144)
BUN: 17 mg/dL (ref 7–25)
CO2: 29 mmol/L (ref 20–32)
Calcium: 10.3 mg/dL (ref 8.6–10.3)
Chloride: 104 mmol/L (ref 98–110)
Creat: 0.92 mg/dL (ref 0.70–1.35)
Globulin: 2.3 g/dL (calc) (ref 1.9–3.7)
Glucose, Bld: 107 mg/dL — ABNORMAL HIGH (ref 65–99)
Potassium: 4.4 mmol/L (ref 3.5–5.3)
Sodium: 139 mmol/L (ref 135–146)
Total Bilirubin: 0.3 mg/dL (ref 0.2–1.2)
Total Protein: 6.7 g/dL (ref 6.1–8.1)
eGFR: 90 mL/min/{1.73_m2} (ref >=60)

## 2021-12-29 LAB — SEDIMENTATION RATE: Sed Rate: 6 mm/h (ref 0–20)

## 2021-12-29 LAB — HEMOGLOBIN A1C
Hgb A1c MFr Bld: 5.9 % of total Hgb — ABNORMAL HIGH (ref <5.7)
Mean Plasma Glucose: 123 mg/dL
eAG (mmol/L): 6.8 mmol/L

## 2021-12-29 LAB — VITAMIN D 25 HYDROXY (VIT D DEFICIENCY, FRACTURES): Vit D, 25-Hydroxy: 51 ng/mL (ref 30–100)

## 2021-12-29 LAB — C-REACTIVE PROTEIN: CRP: 0.3 mg/L (ref <8.0)

## 2021-12-30 ENCOUNTER — Other Ambulatory Visit: Payer: Self-pay | Admitting: Nurse Practitioner

## 2021-12-30 DIAGNOSIS — R7989 Other specified abnormal findings of blood chemistry: Secondary | ICD-10-CM

## 2021-12-31 ENCOUNTER — Other Ambulatory Visit: Payer: PRIVATE HEALTH INSURANCE

## 2021-12-31 ENCOUNTER — Other Ambulatory Visit: Payer: Self-pay

## 2021-12-31 DIAGNOSIS — R7989 Other specified abnormal findings of blood chemistry: Secondary | ICD-10-CM | POA: Diagnosis not present

## 2022-01-01 LAB — TESTOSTERONE: Testosterone: 401 ng/dL (ref 250–827)

## 2022-01-05 ENCOUNTER — Other Ambulatory Visit: Payer: Self-pay | Admitting: Adult Health

## 2022-01-10 ENCOUNTER — Telehealth: Payer: Self-pay

## 2022-01-10 NOTE — Telephone Encounter (Signed)
PA was approved thru 07/13/2022

## 2022-01-10 NOTE — Telephone Encounter (Signed)
Prior Authorization for [employer] submitted.

## 2022-01-29 ENCOUNTER — Encounter: Payer: Self-pay | Admitting: Nurse Practitioner

## 2022-02-07 NOTE — Telephone Encounter (Signed)
Spoke with Pt in regard to recent symptoms: Pt stated that he recently had Covid and now is experiencing excessive amount of belching with a metallic  taste, passing gas a lot accompanied with stomach cramps and gurgling: Pt states that BM's are regular and does not feel constipated: Pt stated that he started taking Imodium  AD two days ago to see if that would help; Pt notified to stop taking the Imodium AD,  try OTC gas x, drink lots of water; walk frequent: Pt was scheduled for an office visit to see Ellouise Newer PA on 02/15/2022 at 1:30 PM: Pt made aware:  Pt verbalized understanding with all questions answered.

## 2022-02-15 ENCOUNTER — Ambulatory Visit (INDEPENDENT_AMBULATORY_CARE_PROVIDER_SITE_OTHER): Payer: BC Managed Care – PPO | Admitting: Physician Assistant

## 2022-02-15 ENCOUNTER — Encounter: Payer: Self-pay | Admitting: Physician Assistant

## 2022-02-15 ENCOUNTER — Ambulatory Visit (INDEPENDENT_AMBULATORY_CARE_PROVIDER_SITE_OTHER)
Admission: RE | Admit: 2022-02-15 | Discharge: 2022-02-15 | Disposition: A | Discharge status: Home / Self Care | Payer: BC Managed Care – PPO | Source: Ambulatory Visit | Attending: Physician Assistant | Admitting: Physician Assistant

## 2022-02-15 ENCOUNTER — Other Ambulatory Visit (INDEPENDENT_AMBULATORY_CARE_PROVIDER_SITE_OTHER): Payer: Medicare Other

## 2022-02-15 VITALS — BP 120/80 | HR 65 | Ht 73.0 in | Wt 224.0 lb

## 2022-02-15 DIAGNOSIS — R142 Eructation: Secondary | ICD-10-CM

## 2022-02-15 DIAGNOSIS — R143 Flatulence: Secondary | ICD-10-CM

## 2022-02-15 DIAGNOSIS — R1084 Generalized abdominal pain: Secondary | ICD-10-CM

## 2022-02-15 DIAGNOSIS — R110 Nausea: Secondary | ICD-10-CM

## 2022-02-15 DIAGNOSIS — R109 Unspecified abdominal pain: Secondary | ICD-10-CM | POA: Diagnosis not present

## 2022-02-15 LAB — CBC WITH DIFFERENTIAL/PLATELET
Basophils Absolute: 0.1 10*3/uL (ref 0.0–0.1)
Basophils Relative: 0.9 % (ref 0.0–3.0)
Eosinophils Absolute: 1.9 10*3/uL — ABNORMAL HIGH (ref 0.0–0.7)
Eosinophils Relative: 22.2 % — ABNORMAL HIGH (ref 0.0–5.0)
HCT: 39.7 % (ref 39.0–52.0)
Hemoglobin: 13.4 g/dL (ref 13.0–17.0)
Lymphocytes Relative: 17.3 % (ref 12.0–46.0)
Lymphs Abs: 1.5 10*3/uL (ref 0.7–4.0)
MCHC: 33.7 g/dL (ref 30.0–36.0)
MCV: 86.8 fl (ref 78.0–100.0)
Monocytes Absolute: 0.8 10*3/uL (ref 0.1–1.0)
Monocytes Relative: 9.7 % (ref 3.0–12.0)
Neutro Abs: 4.3 10*3/uL (ref 1.4–7.7)
Neutrophils Relative %: 49.9 % (ref 43.0–77.0)
Platelets: 248 10*3/uL (ref 150.0–400.0)
RBC: 4.57 Mil/uL (ref 4.22–5.81)
RDW: 15.2 % (ref 11.5–15.5)
WBC: 8.7 10*3/uL (ref 4.0–10.5)

## 2022-02-15 LAB — COMPREHENSIVE METABOLIC PANEL
ALT: 33 U/L (ref 0–53)
AST: 32 U/L (ref 0–37)
Albumin: 4.1 g/dL (ref 3.5–5.2)
Alkaline Phosphatase: 34 U/L — ABNORMAL LOW (ref 39–117)
BUN: 15 mg/dL (ref 6–23)
CO2: 28 mEq/L (ref 19–32)
Calcium: 9.3 mg/dL (ref 8.4–10.5)
Chloride: 102 mEq/L (ref 96–112)
Creatinine, Ser: 0.81 mg/dL (ref 0.40–1.50)
GFR: 89.96 mL/min (ref >60.00)
Glucose, Bld: 89 mg/dL (ref 70–99)
Potassium: 3.6 mEq/L (ref 3.5–5.1)
Sodium: 137 mEq/L (ref 135–145)
Total Bilirubin: 0.4 mg/dL (ref 0.2–1.2)
Total Protein: 6.4 g/dL (ref 6.0–8.3)

## 2022-02-15 LAB — LIPASE: Lipase: 24 U/L (ref 11.0–59.0)

## 2022-02-15 MED ORDER — PANTOPRAZOLE SODIUM 40 MG PO TBEC: 40.0000 mg | DELAYED_RELEASE_TABLET | Freq: Two times a day (BID) | ORAL | 5 refills | Status: DC

## 2022-02-15 MED ORDER — FAMOTIDINE 20 MG PO TABS: 20.0000 mg | ORAL_TABLET | ORAL | 1 refills | Status: DC | PRN

## 2022-02-15 NOTE — Progress Notes (Addendum)
Chief Complaint: Excessive gas, abdominal cramping  HPI:    Mr. Emberton is a 71 year old male with a past medical history as listed below including CAD, GERD, GI bleed at the age of 4 secondary to [place] diverticulum status postsurgical resection, GERD, duodenal adenoma and colon polyps and multiple others, known to Dr. Hilarie Fredrickson, who was referred to me by Unk Pinto, MD for a complaint of excessive gas and abdominal cramping.    11/22/2019 colonoscopy with 2 adenomatous polyps removed in the ascending colon and one hyperplastic polyp and 1 adenomatous polyp removed from the descending and sigmoid colon.  Repeat recommended in 3 years.    01/21/2021 EGD for follow-up of duodenal adenoma with a nonobstructing Schatzki's ring, 5 cm hiatal hernia and a duodenal scar in the second/third portion of the duodenum, biopsy showed benign small bowel mucosa.  Repeat EGD was recommended in 2 years.    10/21/2021 office visit with Carl Best, NP for reflux at night.  At that time patient was told to take Pantoprazole 40 mg twice daily and Pepcid 20 mg at night as needed.    02/05/2022 patient described having a bout of COVID in early July which cleared up after week but since then had terrible gas and belching with cramping.  Bowel movements were solid at that time.  He was called back on 8/7 and described that he had tried Imodium.  He was told to stop this and try OTC Gas-X and drink lots of water and walk frequently and was made an appointment with me.  8/10 patient replied that Gas-X did not know good and that morning he had woke up with diarrhea.    Today, patient presents to clinic accompanied by his wife.  They explain that in March he started on Ozempic at a low dose which was slowly increased over the past few months.  Then in June he turned out to be COVID-positive and over the past 2 to 3 weeks he has been experiencing a lot of belching and gas from both ends which to smells terrible.  His wife  tells me even when he belches it smells like "bowel".  He describes that he has a lot of abdominal cramping as well and seems to radiate back and forth between semisolid stool and fluid stool.  Describes that at night he will lay down to sleep and can just feel the gas rolling around and will typically end up awake in order to go to the bathroom again or pass gas out of the room.  Also describes an increase in reflux symptoms tell me that he will have acid come up in his mouth oftentimes and he is sleeping and throughout the day.  He has tried various products including Imodium, Metamucil, Antacids and Gas-X which have made no difference.  Tells me he has no appetite and when he eats he just feels bad.  Does tell me he is not taking his Pantoprazole 40 mg twice a day consistently.  He misses doses frequently.  His wife told him she thinks that the Ozempic is causing this problem so he skipped his dose which was due last Sunday.  Associated symptoms include occasional waves of nausea.    Denies fever, chills, blood in his stool, vomiting or weight loss.  Past Medical History:  Diagnosis Date   Arthritis    CAD (coronary artery disease) 07/13/2021   Cor Ca++ Score 837.  Mixed mod plaque prox RCA(50 to 69%).  Mild Ca++ plaque  mid RCA (25-49%).  Minimal Ca++ plaque prox LCx (0-24%).  Mid LCx CA++ plaque (50 to 69%), Prox OM1 CA++ plaque (50-69%).  Prox LAD mild to 0-24%. LM  mild 0-24% Ca++  plaque. => CT FFR + for distal LCx (small caliber)  Negative findings in the LAD, OM1 and  and RCA. =>  Would not recommend catheterization.   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 Hilarie Fredrickson:  Low-grade of narrowing Schatzki ring. Dilated to 18 mm with balloon. - 3 cm hiatal hernia. - Normal stomach. - A single duodenal polyp with appearance most consistent with adenoma. -  Duodenal mucosal lymphangiectasias.    GERD (gastroesophageal reflux disease)    Hiatal hernia    History of colon polyps    Hyperlipidemia    Hypertension    No sign of renal artery stenosis   IBS (irritable bowel syndrome)    RBBB    Systolic murmur    no  current problems   Vertigo    Vitamin D deficiency     Past Surgical History:  Procedure Laterality Date   ABDOMINAL SURGERY  1956   Related to bleeding episode.    BIOPSY  01/27/2020   Procedure: BIOPSY;  Surgeon: Rush Landmark Telford Nab., MD;  Location: [place];  Service: Gastroenterology;;   BIOPSY  01/21/2021   Procedure: BIOPSY;  Surgeon: Irving Copas., MD;  Location: [place];  Service: Gastroenterology;;   CARDIAC CATHETERIZATION  2005   no intervention per patient   CARPAL TUNNEL RELEASE Left 03/06/2020   Dr. Amedeo Plenty   COCHLEAR IMPLANT Bilateral    COLONOSCOPY     several - Last one 11/22/19   CYST REMOVAL HAND Left 1975   [place]   ENDOSCOPIC MUCOSAL RESECTION N/A 01/27/2020   Procedure: ENDOSCOPIC MUCOSAL RESECTION;  Surgeon: Irving Copas., MD;  Location: [place];  Service: Gastroenterology;  Laterality: N/A;   ENTEROSCOPY N/A 01/27/2020   Procedure: ENTEROSCOPY;  Surgeon: Rush Landmark Telford Nab., MD;  Location: [place];  Service: Gastroenterology;  Laterality: N/A;   ESOPHAGOGASTRODUODENOSCOPY (EGD) WITH PROPOFOL N/A 01/21/2021   Procedure: ESOPHAGOGASTRODUODENOSCOPY (EGD) WITH PROPOFOL;  Surgeon: Rush Landmark Telford Nab., MD;  Location: [place];  Service: Gastroenterology;  Laterality: N/A;   HEMOSTASIS CLIP PLACEMENT  01/27/2020   Procedure: HEMOSTASIS CLIP PLACEMENT;  Surgeon: Rush Landmark Telford Nab., MD;  Location: [place];  Service: Gastroenterology;;   SKIN LESION EXCISION     STAPEDECTOMY Bilateral    x 2 - 10 yrs apart - No MRIs per patient.   SUBMUCOSAL LIFTING INJECTION  01/27/2020   Procedure: SUBMUCOSAL LIFTING INJECTION;  Surgeon: Rush Landmark Telford Nab.,  MD;  Location: [place];  Service: Gastroenterology;;   SUBMUCOSAL TATTOO INJECTION  01/27/2020   Procedure: SUBMUCOSAL TATTOO INJECTION;  Surgeon: Irving Copas., MD;  Location: [place];  Service: Gastroenterology;;   TRANSTHORACIC ECHOCARDIOGRAM  07/2017   Normal LV size and function.  EF 55-60%.  No RWMA.  Basal septal hypertrophy, but no sign of significant hypertensive heart disease.  Only GR 1 DD.   TRIGGER FINGER RELEASE Right 2014   Thumb   UPPER GI ENDOSCOPY     several - last one 11/22/2019   WISDOM TOOTH EXTRACTION      Current Outpatient Medications  Medication Sig Dispense Refill   amLODipine (NORVASC) 10 MG tablet TAKE ONE  TABLET BY MOUTH DAILY FOR BLOOD PRESSURE 90 tablet 3   Ascorbic Acid (VITAMIN C) 1000 MG tablet Take 1,000 mg by mouth in the morning and at bedtime.     aspirin 81 MG tablet Take 81 mg by mouth daily.     BIOTIN PO Take 1 tablet by mouth daily.     carvedilol (COREG) 12.5 MG tablet TAKE ONE TABLET BY MOUTH TWICE A DAY 180 tablet 1   ezetimibe (ZETIA) 10 MG tablet TAKE ONE TABLET BY MOUTH DAILY 90 tablet 1   Magnesium 400 MG TABS Take 400 mg by mouth daily.     olmesartan-hydrochlorothiazide (BENICAR HCT) 40-25 MG tablet TAKE ONE TABLET BY MOUTH DAILY FOR BLOOD PRESSURE 90 tablet 3   pantoprazole (PROTONIX) 40 MG tablet Take 30 minutes before breakfast and 30 minutes before dinner every day. 60 tablet 1   rosuvastatin (CRESTOR) 40 MG tablet Take  1 tablet  Daily  for Cholesterol                                                         /                                 TAKE ONE TABLET BY MOUTH 90 tablet 3   Semaglutide, 1 MG/DOSE, (OZEMPIC, 1 MG/DOSE,) 4 MG/3ML SOPN Inject 1 mg into the skin once a week. 9 mL 1   Thiamine HCl (VITAMIN B-1) 250 MG tablet Take 250 mg by mouth daily.     vitamin E 200 UNIT capsule Take 200 Units by mouth 2 (two) times daily.     Zinc 50 MG TABS Take 50 mg by mouth daily.      No current facility-administered  medications for this visit.    Allergies as of 02/15/2022   (No Known Allergies)    Family History  Problem Relation Age of Onset   Heart disease Mother        Atrial flutter   COPD Mother    Heart failure Mother    Kidney disease Father    Arrhythmia Brother    Heart attack Brother    Hypertension Brother    Hyperlipidemia Brother    Hypertension Brother    Hyperlipidemia Brother    Esophageal cancer Neg Hx    Colon cancer Neg Hx     Social History   Socioeconomic History   Marital status: Married    Spouse name: Not on file   Number of children: Not on file   Years of education: Not on file   Highest education level: Not on file  Occupational History   Not on file  Tobacco Use   Smoking status: Never   Smokeless tobacco: Never  Vaping Use   Vaping Use: Never used  Substance and Sexual Activity   Alcohol use: Yes    Comment: occasional wine   Drug use: No    Comment: In 42s - cocaine, marijuana   Sexual activity: Yes    Partners: Female    Birth control/protection: None  Other Topics Concern   Not on file  Social History Narrative   Not on file   Social Determinants of Health   Financial Resource Strain: Not on file  Food Insecurity:  Not on file  Transportation Needs: Not on file  Physical Activity: Insufficiently Active (05/20/2019)   Exercise Vital Sign    Days of Exercise per Week: 7 days    Minutes of Exercise per Session: 20 min  Stress: No Stress Concern Present (05/20/2019)   [place]    Feeling of Stress : Only a little  Social Connections: Not on file  Intimate Partner Violence: Not on file    Review of Systems:    Constitutional: No weight loss, fever or chills Cardiovascular: No chest pain Respiratory: No SOB Gastrointestinal: See HPI and otherwise negative   Physical Exam:  Vital signs: BP 120/80   Pulse 65   Ht '6\' 1"'$  (1.854 m)   Wt 224 lb (101.6 kg)   BMI  29.55 kg/m    Constitutional:   Pleasant Caucasian male appears to be in NAD, Well developed, Well nourished, alert and cooperative Respiratory: Respirations even and unlabored. Lungs clear to auscultation bilaterally.   No wheezes, crackles, or rhonchi.  Cardiovascular: Normal S1, S2. No MRG. Regular rate and rhythm. No peripheral edema, cyanosis or pallor.  Gastrointestinal:  Soft, nondistended, mild generalized ttp, No rebound or guarding.  Increased bowel sounds all 4 quadrants. No appreciable masses or hepatomegaly. Rectal:  Not performed.  Psychiatric: Demonstrates good judgement and reason without abnormal affect or behaviors.  RELEVANT LABS AND IMAGING: CBC    Component Value Date/Time   WBC 6.0 12/28/2021 1159   RBC 4.72 12/28/2021 1159   HGB 13.8 12/28/2021 1159   HCT 41.6 12/28/2021 1159   PLT 382 12/28/2021 1159   MCV 88.1 12/28/2021 1159   MCH 29.2 12/28/2021 1159   MCHC 33.2 12/28/2021 1159   RDW 13.6 12/28/2021 1159   LYMPHSABS 1,236 12/28/2021 1159   EOSABS 456 12/28/2021 1159   BASOSABS 72 12/28/2021 1159    CMP     Component Value Date/Time   NA 139 12/28/2021 1159   K 4.4 12/28/2021 1159   CL 104 12/28/2021 1159   CO2 29 12/28/2021 1159   GLUCOSE 107 (H) 12/28/2021 1159   BUN 17 12/28/2021 1159   CREATININE 0.92 12/28/2021 1159   CALCIUM 10.3 12/28/2021 1159   PROT 6.7 12/28/2021 1159   AST 25 12/28/2021 1159   ALT 28 12/28/2021 1159   BILITOT 0.3 12/28/2021 1159   GFRNONAA 89 11/23/2020 1636   GFRAA 103 11/23/2020 1636    Assessment: 1.  Generalized abdominal cramping: With below, patient is passing stool and gas, but likely there is some element of gastroparesis given Ozempic +/- post-COVID effect 2.  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  Change in bowel habits: Radiating from diarrhea to semisolid stool with all of the above  Plan: 1.  Ordered a 2 view  abdominal x-ray today due to pain and excessive gas. 2.  Recommend the patient discontinue Ozempic.  I feel that most likely this medication is causing all of his trouble.  He should follow-up with his PCP about this so they can offer an alternative if necessary. 3.  Recommend a low fiber /low residue diet for now.  Provided him a handout. 4.  Ordered labs including CBC, CMP and lipase. 5.  Discussed with patient that if stopping the Ozempic does not seem to help his symptoms over the next couple of weeks then could consider further testing for SIBO or other cause.  He  will call and let us know how he is doing. 6.  Also discussed with patient that if symptoms get worse, he develops vomiting or abdominal pain is continuous or he runs a fever, etc. then he needs to go to the ER. 7.  Also recommend the patient use his Pantoprazole 40 mg twice a day, 30 minutes before breakfast and again before dinner.  He tells me he has this medication at home but will be more diligent about taking it.  Also discussed that if he has breakthrough reflux symptoms at night he could take a Pepcid 20 mg. 8.  Patient to follow in clinic per recommendations after labs and testing above.  Ellouise Newer, PA-C [facility] 02/15/2022, 1:25 PM  Cc: Unk Pinto, MD

## 2022-02-15 NOTE — Patient Instructions (Signed)
Your provider has requested that you go to the basement level for lab work before leaving today. Press "B" on the elevator. The lab is located at the first door on the left as you exit the elevator.  Your provider has requested that you have an abdominal x ray before leaving today. Please go to the basement floor to our Radiology department for the test.  We have sent the following medications to your pharmacy for you to pick up at your convenience: Pantoprazole 40 mg twice daily 30-60 minutes before breakfast and dinner. Pepcid 20 mg as needed for breakthrough reflux.    Stop Ozempic.  _______________________________________________________  If you are age 9 or older, your body mass index should be between 23-30. Your Body mass index is 29.55 kg/m. If this is out of the aforementioned range listed, please consider follow up with your Primary Care Provider.  If you are age 79 or younger, your body mass index should be between 19-25. Your Body mass index is 29.55 kg/m. If this is out of the aformentioned range listed, please consider follow up with your Primary Care Provider.   ________________________________________________________  The [facility] providers would like to encourage you to use [facility] to communicate with providers for non-urgent requests or questions.  Due to long hold times on the telephone, sending your provider a message by [facility] may be a faster and more efficient way to get a response.  Please allow 48 business hours for a response.  Please remember that this is for non-urgent requests.  _______________________________________________________

## 2022-02-16 NOTE — Progress Notes (Signed)
Addendum: Reviewed and agree with assessment and management plan. Keithon Mccoin M, MD  

## 2022-02-16 NOTE — Telephone Encounter (Signed)
02/15/22 lab results sent to PCP via epic. Patient notified via [place].

## 2022-02-17 DIAGNOSIS — M79671 Pain in right foot: Secondary | ICD-10-CM | POA: Diagnosis not present

## 2022-02-17 DIAGNOSIS — M79672 Pain in left foot: Secondary | ICD-10-CM | POA: Diagnosis not present

## 2022-02-17 DIAGNOSIS — M2021 Hallux rigidus, right foot: Secondary | ICD-10-CM | POA: Diagnosis not present

## 2022-02-17 DIAGNOSIS — M2022 Hallux rigidus, left foot: Secondary | ICD-10-CM | POA: Diagnosis not present

## 2022-03-03 ENCOUNTER — Ambulatory Visit: Payer: BC Managed Care – PPO | Admitting: Nurse Practitioner

## 2022-03-10 ENCOUNTER — Ambulatory Visit (INDEPENDENT_AMBULATORY_CARE_PROVIDER_SITE_OTHER): Payer: Medicare Other | Admitting: Nurse Practitioner

## 2022-03-10 ENCOUNTER — Encounter: Payer: Self-pay | Admitting: Nurse Practitioner

## 2022-03-10 VITALS — BP 118/60 | HR 61 | Temp 97.5°F | Ht 73.0 in | Wt 226.0 lb

## 2022-03-10 DIAGNOSIS — M79671 Pain in right foot: Secondary | ICD-10-CM

## 2022-03-10 DIAGNOSIS — R2242 Localized swelling, mass and lump, left lower limb: Secondary | ICD-10-CM

## 2022-03-10 DIAGNOSIS — I10 Essential (primary) hypertension: Secondary | ICD-10-CM

## 2022-03-10 DIAGNOSIS — R5382 Chronic fatigue, unspecified: Secondary | ICD-10-CM | POA: Diagnosis not present

## 2022-03-10 DIAGNOSIS — R400 Somnolence: Secondary | ICD-10-CM

## 2022-03-10 DIAGNOSIS — U099 Post covid-19 condition, unspecified: Secondary | ICD-10-CM | POA: Diagnosis not present

## 2022-03-10 DIAGNOSIS — Z79899 Other long term (current) drug therapy: Secondary | ICD-10-CM

## 2022-03-10 DIAGNOSIS — M79672 Pain in left foot: Secondary | ICD-10-CM

## 2022-03-10 DIAGNOSIS — H538 Other visual disturbances: Secondary | ICD-10-CM

## 2022-03-10 NOTE — Progress Notes (Unsigned)
Vision has not gotten back to normal  -  Has not taken Ozempic for 1 mo - has more energy Take 5 mg Norvasc - cut in 1/2   Assessment and Plan:  Nathaniel Gill was seen today for an episodic visit.  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 Continue to monitor  - CBC with Differential/Platelet - Sed Rate (ESR) - C-reactive protein - COMPLETE METABOLIC PANEL WITH GFR  2. Chronic fatigue  - CBC with Differential/Platelet - VITAMIN D 25 Hydroxy (Vit-D Deficiency, Fractures) - Vitamin B12 - Sed Rate (ESR) - C-reactive protein  3. Daytime sleepiness Continue nose strip.  - CBC with Differential/Platelet - VITAMIN D 25 Hydroxy (Vit-D Deficiency, Fractures) - Vitamin B12 - Sed Rate (ESR) - C-reactive protein - Hemoglobin A1c - COMPLETE METABOLIC PANEL WITH GFR  4. Vitamin D deficiency  - VITAMIN D 25 Hydroxy (Vit-D Deficiency, Fractures)  5. Vitamin B 12 deficiency  - Vitamin B12  6. Metabolic syndrome  - CBC with Differential/Platelet - VITAMIN D 25 Hydroxy (Vit-D Deficiency, Fractures) - Vitamin B12 - Sed Rate (ESR) - C-reactive protein - Hemoglobin A1c - COMPLETE METABOLIC PANEL WITH GFR  Notify office for further evaluation and treatment, questions or concerns if s/s fail to improve. The risks and benefits of my recommendations, as well as other treatment options were discussed with the patient today. Questions were answered.  Further disposition pending results of labs. Discussed med's effects and SE's.    Over 20 minutes of exam, counseling, chart review, and critical decision making was performed.   Future Appointments  Date Time Provider [place]  06/16/2022 10:00 AM Darrol Jump, NP [facility] None  01/03/2023  9:00 AM Cecil Vandyke, Kenney Houseman, NP [facility] None    ------------------------------------------------------------------------------------------------------------------   HPI BP 118/60   Pulse 61    Temp (!) 97.5 F (36.4 C)   Ht '6\' 1"'  (1.854 m)   Wt 226 lb (102.5 kg)   SpO2 95%   BMI 29.82 kg/m   BMI is Body mass index is 29.82 kg/m., he {HAS HAS OZY:24825} been working on diet and exercise. Wt Readings from Last 3 Encounters:  03/10/22 226 lb (102.5 kg)  02/15/22 224 lb (101.6 kg)  12/28/21 225 lb 12.8 oz (102.4 kg)     71 y.o.male presents for reports having Covid early part of June 2023.  Sysmtpoms were mild and included nasal congestin and cough.   The last time he had it was 2020 and was short term with similar symptoms.  He has taken OTC sinus meds the entire time with effectiveness.  While his head cold has improved, he continues to feel extreme fatigue including falling asleep at his desk while working. He now has to force himself to work out with [employer].   He has recently stopped Viagra.  He has been on Ozempic for several months.  Feels as though his muscle mass has changed and legs are thinner.  Denis any stomach or bowel issues.    He does not have a CPAP but wears a nose strip at night for snorning.    He gives blood on a regular basis.  Past Medical History:  Diagnosis Date   Arthritis    CAD (coronary artery disease) 07/13/2021   Cor Ca++ Score 837.  Mixed mod plaque prox RCA(50 to 69%).  Mild Ca++ plaque mid RCA (25-49%).  Minimal Ca++ plaque prox LCx (0-24%).  Mid LCx CA++ plaque (50 to 69%), Prox OM1 CA++  plaque (50-69%).  Prox LAD mild to 0-24%. LM  mild 0-24% Ca++  plaque. => CT FFR + for distal LCx (small caliber)  Negative findings in the LAD, OM1 and  and RCA. =>  Would not recommend catheterization.   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 Hilarie Fredrickson:  Low-grade of narrowing Schatzki ring. Dilated to 18 mm with balloon. - 3 cm hiatal hernia. - Normal stomach. - A single duodenal polyp with appearance  most consistent with adenoma. - Duodenal mucosal lymphangiectasias.    GERD (gastroesophageal reflux disease)    Hiatal hernia    History of colon polyps    Hyperlipidemia    Hypertension    No sign of renal artery stenosis   IBS (irritable bowel syndrome)    RBBB    Systolic murmur    no  current problems   Vertigo    Vitamin D deficiency      No Known Allergies  Current Outpatient Medications on File Prior to Visit  Medication Sig   amLODipine (NORVASC) 10 MG tablet TAKE ONE TABLET BY MOUTH DAILY FOR BLOOD PRESSURE   Ascorbic Acid (VITAMIN C) 1000 MG tablet Take 1,000 mg by mouth in the morning and at bedtime.   aspirin 81 MG tablet Take 81 mg by mouth daily.   BIOTIN PO Take 1 tablet by mouth daily.   carvedilol (COREG) 12.5 MG tablet TAKE ONE TABLET BY MOUTH TWICE A DAY   ezetimibe (ZETIA) 10 MG tablet TAKE ONE TABLET BY MOUTH DAILY   famotidine (PEPCID) 20 MG tablet Take 1 tablet (20 mg total) by mouth as needed for heartburn or indigestion.   Magnesium 400 MG TABS Take 400 mg by mouth daily.   olmesartan-hydrochlorothiazide (BENICAR HCT) 40-25 MG tablet TAKE ONE TABLET BY MOUTH DAILY FOR BLOOD PRESSURE   pantoprazole (PROTONIX) 40 MG tablet Take 1 tablet (40 mg total) by mouth 2 (two) times daily before a meal. Take 30 minutes before breakfast and 30 minutes before dinner every day.   rosuvastatin (CRESTOR) 40 MG tablet Take  1 tablet  Daily  for Cholesterol                                                         /                                 TAKE ONE TABLET BY MOUTH   Thiamine HCl (VITAMIN B-1) 250 MG tablet Take 250 mg by mouth daily.   vitamin E 200 UNIT capsule Take 200 Units by mouth 2 (two) times daily.   Zinc 50 MG TABS Take 50 mg by mouth daily.    Semaglutide, 1 MG/DOSE, (OZEMPIC, 1 MG/DOSE,) 4 MG/3ML SOPN Inject 1 mg into the skin once a week. (Patient not taking: Reported on 03/10/2022)   No current facility-administered medications on file prior to visit.     ROS: all negative except what is noted in the HPI.    Physical Exam:  BP 118/60   Pulse 61   Temp (!) 97.5 F (36.4 C)  Ht '6\' 1"'  (1.854 m)   Wt 226 lb (102.5 kg)   SpO2 95%   BMI 29.82 kg/m   General Appearance: NAD.  Awake, conversant and cooperative. Eyes: PERRLA, EOMs intact.  Sclera white.  Conjunctiva without erythema. Sinuses: No frontal/maxillary tenderness.  No nasal discharge. Nares patent.  ENT/Mouth: Ext aud canals clear.  Bilateral TMs w/DOL and without erythema or bulging. Hearing intact.  Posterior pharynx without swelling or exudate.  Tonsils without swelling or erythema.  Neck: Supple.  No masses, nodules or thyromegaly. Respiratory: Effort is regular with non-labored breathing. Breath sounds are equal bilaterally without rales, rhonchi, wheezing or stridor.  Cardio: RRR with no MRGs. Brisk peripheral pulses without edema.  Abdomen: Active BS in all four quadrants.  Soft and non-tender without guarding, rebound tenderness, hernias or masses. Lymphatics: Non tender without lymphadenopathy.  Musculoskeletal: Full ROM, 5/5 strength, normal ambulation.  No clubbing or cyanosis. Skin: Appropriate color for ethnicity. Warm without rashes, lesions, ecchymosis, ulcers.  Neuro: CN II-XII grossly normal. Normal muscle tone without cerebellar symptoms and intact sensation.   Psych: AO X 3,  appropriate mood and affect, insight and judgment.     Darrol Jump, NP 3:06 PM [facility]

## 2022-03-21 DIAGNOSIS — D3122 Benign neoplasm of left retina: Secondary | ICD-10-CM | POA: Diagnosis not present

## 2022-03-21 DIAGNOSIS — H5213 Myopia, bilateral: Secondary | ICD-10-CM | POA: Diagnosis not present

## 2022-03-24 ENCOUNTER — Ambulatory Visit (INDEPENDENT_AMBULATORY_CARE_PROVIDER_SITE_OTHER): Payer: BC Managed Care – PPO | Admitting: Podiatry

## 2022-03-24 ENCOUNTER — Ambulatory Visit (INDEPENDENT_AMBULATORY_CARE_PROVIDER_SITE_OTHER): Payer: Medicare Other

## 2022-03-24 ENCOUNTER — Encounter: Payer: Self-pay | Admitting: Podiatry

## 2022-03-24 DIAGNOSIS — M79675 Pain in left toe(s): Secondary | ICD-10-CM | POA: Diagnosis not present

## 2022-03-24 DIAGNOSIS — M79671 Pain in right foot: Secondary | ICD-10-CM

## 2022-03-24 DIAGNOSIS — M25371 Other instability, right ankle: Secondary | ICD-10-CM

## 2022-03-24 DIAGNOSIS — L600 Ingrowing nail: Secondary | ICD-10-CM

## 2022-03-24 DIAGNOSIS — M25571 Pain in right ankle and joints of right foot: Secondary | ICD-10-CM

## 2022-03-24 NOTE — Patient Instructions (Signed)

## 2022-03-24 NOTE — Progress Notes (Signed)
Subjective:  Patient ID: Nathaniel Gill, male    DOB: [birth year],   MRN: [id number]  Chief Complaint  Patient presents with   Nail Problem     Left great to pain, constant , no swelling or redness    Ankle Pain    Right ankle pain , pain varies with swelling     71 y.o. male presents for primary concern of left great toenail pain that has been going on chronically. Relates the inside of the toe hits the other toe and will cause pain not sure if it is the nail. Also has a history of arthritis in left great toe joint. Also mentioned concern of right ankle giving out. Has a history of previous ankle injuries and sprains and noticing more his ankle giving way. Denies much pain currently. Has used braces in the past.  . Denies any other pedal complaints. Denies n/v/f/c.   Past Medical History:  Diagnosis Date   Arthritis    CAD (coronary artery disease) 07/13/2021   Cor Ca++ Score 837.  Mixed mod plaque prox RCA(50 to 69%).  Mild Ca++ plaque mid RCA (25-49%).  Minimal Ca++ plaque prox LCx (0-24%).  Mid LCx CA++ plaque (50 to 69%), Prox OM1 CA++ plaque (50-69%).  Prox LAD mild to 0-24%. LM  mild 0-24% Ca++  plaque. => CT FFR + for distal LCx (small caliber)  Negative findings in the LAD, OM1 and  and RCA. =>  Would not recommend catheterization.   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 Hilarie Fredrickson:  Low-grade of narrowing Schatzki ring. Dilated to 18 mm with balloon. - 3 cm hiatal hernia. - Normal stomach. 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    GERD (gastroesophageal reflux disease)    Hiatal hernia    History of colon polyps    Hyperlipidemia    Hypertension    No sign of renal artery stenosis   IBS (irritable bowel syndrome)    RBBB    Systolic murmur    no   current problems   Vertigo    Vitamin D deficiency     Objective:  Physical Exam: Vascular: DP/PT pulses 2/4 bilateral. CFT <3 seconds. Normal hair growth on digits. No edema.  Skin. No lacerations or abrasions bilateral feet. Incurvation of left hallux lateral border. No erythema edema or purulence noted.  Musculoskeletal: MMT 5/5 bilateral lower extremities in DF, PF, Inversion and Eversion. Deceased ROM in DF of ankle joint. Right ankle increased eversion. No pain to palpation over ligament and no pain with MMT. Prominence noted to medial malleolus but with no pain.  Neurological: Sensation intact to light touch.   Assessment:   1. Ingrown left greater toenail   2. Right ankle instability      Plan:  Patient was evaluated and treated and all questions answered. Patient requesting removal of ingrown nail today. Procedure below.  Discussed procedure and post procedure care and patient expressed understanding.  Will follow-up in 2 weeks for nail check or sooner if any problems arise.   Did discussed right ankle instability and likely related to history of ankle sprains. At this time suggest continue with bracing with activity and anti-inflammatories as needed.    Procedure:  Procedure: partial Nail Avulsion of left hallux medial nail  border.  Surgeon: Lorenda Peck, DPM  Pre-op Dx: Ingrown toenail without infection Post-op: Same  Place of Surgery: Office exam room.  Indications for surgery: Painful and ingrown toenail.    The patient is requesting removal of nail with chemical matrixectomy. Risks and complications were discussed with the patient for which they understand and written consent was obtained. Under sterile conditions a total of 3 mL of  1% lidocaine plain was infiltrated in a hallux block fashion. Once anesthetized, the skin was prepped in sterile fashion. A tourniquet was then applied. Next the lateral aspect of hallux nail border was then sharply excised making  sure to remove the entire offending nail border.  Next phenol was then applied under standard conditions and copiously irrigated. Silvadene was applied. A dry sterile dressing was applied. After application of the dressing the tourniquet was removed and there is found to be an immediate capillary refill time to the digit. The patient tolerated the procedure well without any complications. Post procedure instructions were discussed the patient for which he verbally understood. Follow-up in two weeks for nail check or sooner if any problems are to arise. Discussed signs/symptoms of infection and directed to call the office immediately should any occur or go directly to the emergency room. In the meantime, encouraged to call the office with any questions, concerns, changes symptoms.   Lorenda Peck, DPM

## 2022-04-07 ENCOUNTER — Encounter: Payer: Self-pay | Admitting: Podiatrist

## 2022-04-07 ENCOUNTER — Ambulatory Visit (INDEPENDENT_AMBULATORY_CARE_PROVIDER_SITE_OTHER): Payer: Medicare Other | Admitting: Podiatrist

## 2022-04-07 DIAGNOSIS — L600 Ingrowing nail: Secondary | ICD-10-CM

## 2022-04-07 NOTE — Progress Notes (Signed)
Chief Complaint  Patient presents with   Nail Problem     Left great toe ingrown nail check     HPI: Patient is 71 y.o. male who presents today for follow up of ingrown nail removal of left great toenail lateral border.  He has been doing well. Relates no pain, no redness, no swelling. Overall very pleased with the result and procedure performed by Dr. Blenda Mounts   No Known Allergies  Review of systems is negative except as noted in the HPI.  Denies nausea/ vomiting/ fevers/ chills or night sweats.   Denies difficulty breathing, denies calf pain or tenderness  Physical Exam  Patient is awake, alert, and oriented x 3.  In no acute distress.    Vascular status is intact with palpable pedal pulses DP and PT Right and capillary refill time less than 3 seconds.  No edema or erythema noted.   Neurological exam reveals epicritic and protective sensation grossly intact   Dermatological exam reveals well healed left hallux status post ingrown procedure on the lateral border. No redness, scant scab present, will resolve on its own.  Right hallux has slight puffiness on the lateral corner of the nail.  Very slight irritation present.   Musculoskeletal exam: Musculature intact bilateral.  Arthritis first mpj left greater than right noted.  Hallux abductus left greater than right.    Assessment:   ICD-10-CM   1. Ingrown left greater toenail  L60.0        Plan: He may continue keeping the toe clean with soap and water when showering.  If any increased redness, swelling or pain arise in the right hallux he will call to have the ingrown procedure on this nail as well.  Discussed applying neosporin or vaseline to the borders to soften the skin to reduce irritation.  He will call in the future should further care be needed.

## 2022-04-12 DIAGNOSIS — M25562 Pain in left knee: Secondary | ICD-10-CM | POA: Diagnosis not present

## 2022-04-12 DIAGNOSIS — M5416 Radiculopathy, lumbar region: Secondary | ICD-10-CM | POA: Diagnosis not present

## 2022-04-16 ENCOUNTER — Encounter: Payer: Self-pay | Admitting: Cardiology

## 2022-04-16 ENCOUNTER — Encounter: Payer: Self-pay | Admitting: Nurse Practitioner

## 2022-04-16 ENCOUNTER — Other Ambulatory Visit: Payer: Self-pay | Admitting: Physician Assistant

## 2022-04-17 ENCOUNTER — Other Ambulatory Visit: Payer: Self-pay

## 2022-04-17 ENCOUNTER — Emergency Department (HOSPITAL_BASED_OUTPATIENT_CLINIC_OR_DEPARTMENT_OTHER)
Admission: EM | Admit: 2022-04-17 | Discharge: 2022-04-17 | Disposition: A | Discharge status: Home / Self Care | Payer: BC Managed Care – PPO | Attending: Emergency Medicine | Admitting: Emergency Medicine

## 2022-04-17 ENCOUNTER — Emergency Department (HOSPITAL_BASED_OUTPATIENT_CLINIC_OR_DEPARTMENT_OTHER): Payer: BC Managed Care – PPO

## 2022-04-17 ENCOUNTER — Encounter (HOSPITAL_BASED_OUTPATIENT_CLINIC_OR_DEPARTMENT_OTHER): Payer: Self-pay | Admitting: Emergency Medicine

## 2022-04-17 DIAGNOSIS — I700 Atherosclerosis of aorta: Secondary | ICD-10-CM | POA: Diagnosis not present

## 2022-04-17 DIAGNOSIS — K219 Gastro-esophageal reflux disease without esophagitis: Secondary | ICD-10-CM

## 2022-04-17 DIAGNOSIS — I129 Hypertensive chronic kidney disease with stage 1 through stage 4 chronic kidney disease, or unspecified chronic kidney disease: Secondary | ICD-10-CM | POA: Insufficient documentation

## 2022-04-17 DIAGNOSIS — N182 Chronic kidney disease, stage 2 (mild): Secondary | ICD-10-CM | POA: Diagnosis not present

## 2022-04-17 DIAGNOSIS — R110 Nausea: Secondary | ICD-10-CM | POA: Diagnosis not present

## 2022-04-17 DIAGNOSIS — I10 Essential (primary) hypertension: Secondary | ICD-10-CM | POA: Diagnosis not present

## 2022-04-17 DIAGNOSIS — R109 Unspecified abdominal pain: Secondary | ICD-10-CM | POA: Diagnosis not present

## 2022-04-17 DIAGNOSIS — I2510 Atherosclerotic heart disease of native coronary artery without angina pectoris: Secondary | ICD-10-CM | POA: Diagnosis not present

## 2022-04-17 DIAGNOSIS — Z79899 Other long term (current) drug therapy: Secondary | ICD-10-CM | POA: Insufficient documentation

## 2022-04-17 DIAGNOSIS — K3184 Gastroparesis: Secondary | ICD-10-CM | POA: Diagnosis not present

## 2022-04-17 DIAGNOSIS — R112 Nausea with vomiting, unspecified: Secondary | ICD-10-CM | POA: Diagnosis not present

## 2022-04-17 DIAGNOSIS — E1122 Type 2 diabetes mellitus with diabetic chronic kidney disease: Secondary | ICD-10-CM | POA: Insufficient documentation

## 2022-04-17 DIAGNOSIS — R0789 Other chest pain: Secondary | ICD-10-CM | POA: Diagnosis not present

## 2022-04-17 DIAGNOSIS — Z7982 Long term (current) use of aspirin: Secondary | ICD-10-CM | POA: Diagnosis not present

## 2022-04-17 LAB — COMPREHENSIVE METABOLIC PANEL
ALT: 31 U/L (ref 0–44)
AST: 25 U/L (ref 15–41)
Albumin: 4.2 g/dL (ref 3.5–5.0)
Alkaline Phosphatase: 35 U/L — ABNORMAL LOW (ref 38–126)
Anion gap: 4 — ABNORMAL LOW (ref 5–15)
BUN: 20 mg/dL (ref 8–23)
CO2: 27 mmol/L (ref 22–32)
Calcium: 9.8 mg/dL (ref 8.9–10.3)
Chloride: 107 mmol/L (ref 98–111)
Creatinine, Ser: 0.93 mg/dL (ref 0.61–1.24)
GFR, Estimated: >60 mL/min (ref >60)
Glucose, Bld: 111 mg/dL — ABNORMAL HIGH (ref 70–99)
Potassium: 3.8 mmol/L (ref 3.5–5.1)
Sodium: 138 mmol/L (ref 135–145)
Total Bilirubin: 0.8 mg/dL (ref 0.3–1.2)
Total Protein: 7.1 g/dL (ref 6.5–8.1)

## 2022-04-17 LAB — CBC WITH DIFFERENTIAL/PLATELET
Abs Immature Granulocytes: 0.02 10*3/uL (ref 0.00–0.07)
Basophils Absolute: 0.1 10*3/uL (ref 0.0–0.1)
Basophils Relative: 1 %
Eosinophils Absolute: 0.4 10*3/uL (ref 0.0–0.5)
Eosinophils Relative: 4 %
HCT: 41.9 % (ref 39.0–52.0)
Hemoglobin: 14.2 g/dL (ref 13.0–17.0)
Immature Granulocytes: 0 %
Lymphocytes Relative: 14 %
Lymphs Abs: 1.2 10*3/uL (ref 0.7–4.0)
MCH: 29.5 pg (ref 26.0–34.0)
MCHC: 33.9 g/dL (ref 30.0–36.0)
MCV: 86.9 fL (ref 80.0–100.0)
Monocytes Absolute: 1.2 10*3/uL — ABNORMAL HIGH (ref 0.1–1.0)
Monocytes Relative: 13 %
Neutro Abs: 6.1 10*3/uL (ref 1.7–7.7)
Neutrophils Relative %: 68 %
Platelets: 274 10*3/uL (ref 150–400)
RBC: 4.82 MIL/uL (ref 4.22–5.81)
RDW: 13.8 % (ref 11.5–15.5)
WBC: 9 10*3/uL (ref 4.0–10.5)
nRBC: 0 % (ref 0.0–0.2)

## 2022-04-17 LAB — TROPONIN I (HIGH SENSITIVITY): Troponin I (High Sensitivity): 5 ng/L (ref <18)

## 2022-04-17 LAB — LIPASE, BLOOD: Lipase: 31 U/L (ref 11–51)

## 2022-04-17 MED ORDER — METOCLOPRAMIDE HCL 5 MG/ML IJ SOLN
10.0000 mg | Freq: Once | INTRAMUSCULAR | Status: AC
  Administered 2022-04-17: 10 mg via INTRAVENOUS
  Filled 2022-04-17: qty 2

## 2022-04-17 MED ORDER — SODIUM CHLORIDE 0.9 % IV BOLUS
1000.0000 mL | Freq: Once | INTRAVENOUS | Status: AC
  Administered 2022-04-17: 1000 mL via INTRAVENOUS

## 2022-04-17 MED ORDER — FAMOTIDINE IN NACL 20-0.9 MG/50ML-% IV SOLN
20.0000 mg | Freq: Once | INTRAVENOUS | Status: AC
  Administered 2022-04-17: 20 mg via INTRAVENOUS
  Filled 2022-04-17: qty 50

## 2022-04-17 MED ORDER — IOHEXOL 300 MG/ML  SOLN
100.0000 mL | Freq: Once | INTRAMUSCULAR | Status: AC | PRN
  Administered 2022-04-17: 100 mL via INTRAVENOUS

## 2022-04-17 MED ORDER — SODIUM CHLORIDE 0.9 % IV SOLN: INTRAVENOUS | Status: DC | PRN

## 2022-04-17 MED ORDER — LIDOCAINE VISCOUS HCL 2 % MT SOLN
15.0000 mL | Freq: Once | OROMUCOSAL | Status: AC
  Administered 2022-04-17: 15 mL via OROMUCOSAL
  Filled 2022-04-17: qty 15

## 2022-04-17 MED ORDER — LIDOCAINE VISCOUS HCL 2 % MT SOLN: 15.0000 mL | OROMUCOSAL | 0 refills | Status: DC | PRN

## 2022-04-17 MED ORDER — METOCLOPRAMIDE HCL 10 MG PO TABS: 10.0000 mg | ORAL_TABLET | Freq: Four times a day (QID) | ORAL | 0 refills | Status: DC

## 2022-04-17 NOTE — Discharge Instructions (Addendum)
We evaluated you today for your reflux symptoms.  Your lab tests were reassuring and did not show signs of dehydration or electrolyte abnormalities.  Your CT scan did not show any acute problem such as a bowel obstruction, appendicitis, or inflammation in your pancreas.  Your CT scan did show that your stomach appeared large and had significant amount of stomach contents.  This may be due to a condition called gastroparesis.  Gastroparesis is a problem in which the muscles of the stomach do not contract enough to push food into the intestines.  I prescribed you a nausea medicine which can sometimes help with this condition.  Please follow-up closely with your gastroenterologist.  You may need further testing to confirm this condition and they may be able to offer other treatments.  Please return to the emergency department if you develop any persistent vomiting, inability to eat or drink, severe abdominal pain, fevers or chills, or any other concerning symptoms.

## 2022-04-17 NOTE — ED Notes (Signed)
Patient transported to CT 

## 2022-04-17 NOTE — ED Triage Notes (Signed)
Pt reports history of acid reflux. Pt reports worsening sore throat, tasting bile in his mouth, and belching. Also complaining of abd bloating and nausea.

## 2022-04-17 NOTE — ED Notes (Signed)
ED Provider at bedside. 

## 2022-04-17 NOTE — ED Provider Notes (Signed)
[facility] Provider Note  CSN: [id number] Arrival date & time: 04/17/22 1497  Chief Complaint(s) Bloated  HPI Nathaniel Gill is a 71 y.o. male with history of coronary artery disease, hyperlipidemia, hypertension, GERD, presenting with nausea and vomiting.  Patient reports that for the past 3 to 4 days he has had persistent nausea and vomiting with burning vomit and sore throat that feels similar to prior esophageal reflux.  He denies chest pain.  Reports some upper abdominal pain.  Reports he is still passing gas and having bowel movements.  No fevers or chills.  Symptoms are worse with lying flat.  Has not been able to eat or drink much due to the persistent nausea.  Report compliance with his antiacid medications.  No radiation of abdominal pain.  No shortness of breath.  No back pain   Past Medical History Past Medical History:  Diagnosis Date   Arthritis    CAD (coronary artery disease) 07/13/2021   Cor Ca++ Score 837.  Mixed mod plaque prox RCA(50 to 69%).  Mild Ca++ plaque mid RCA (25-49%).  Minimal Ca++ plaque prox LCx (0-24%).  Mid LCx CA++ plaque (50 to 69%), Prox OM1 CA++ plaque (50-69%).  Prox LAD mild to 0-24%. LM  mild 0-24% Ca++  plaque. => CT FFR + for distal LCx (small caliber)  Negative findings in the LAD, OM1 and  and RCA. =>  Would not recommend catheterization.   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 Hilarie Fredrickson:  Low-grade of narrowing Schatzki ring. Dilated to 18 mm with balloon. - 3 cm hiatal hernia. - Normal stomach. 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    GERD (gastroesophageal reflux disease)    Hiatal hernia    History of colon polyps    Hyperlipidemia    Hypertension    No sign of renal artery stenosis    IBS (irritable bowel syndrome)    RBBB    Systolic murmur    no  current problems   Vertigo    Vitamin D deficiency    Patient Active Problem List   Diagnosis Date Noted   Metabolic syndrome 02/63/7858   Agatston coronary artery calcium score greater than 400 (1036 on 06/04/2021) 06/07/2021   S/P dilatation of esophageal stricture 11/23/2020   Polyp of duodenum 03/12/2020   History of adenomatous polyp of colon 12/26/2019   Hiatal hernia 09/04/2019   CKD stage 2 due to type 2 diabetes mellitus ([place]) 02/13/2019   Atherosclerosis of aorta ([place]) 07/26/2018   Overweight (BMI 25.0-29.9) 09/05/2017   Erectile dysfunction associated with type 2 diabetes mellitus ([place]) 05/23/2017   Type 2 diabetes mellitus ([place]) 05/22/2017   Acid reflux 05/22/2017   Vitamin D deficiency 05/22/2017   Medication management 05/22/2017   Degenerative lumbar disc 04/13/2017   RBBB (right bundle branch block) 04/13/2017   Resistant hypertension 02/20/2013   Hyperlipidemia associated with type 2 diabetes mellitus ([place]) 02/20/2013   Home Medication(s) Prior to Admission medications   Medication Sig Start Date End Date Taking? Authorizing Provider  lidocaine (XYLOCAINE) 2 % solution Use as directed 15 mLs in the mouth or throat as needed for mouth pain. 04/17/22  Yes Cristie Hem, MD  metoCLOPramide (REGLAN) 10 MG tablet Take 1 tablet (10 mg  total) by mouth every 6 (six) hours. 04/17/22  Yes Cristie Hem, MD  amLODipine (NORVASC) 10 MG tablet TAKE ONE TABLET BY MOUTH DAILY FOR BLOOD PRESSURE 09/17/21   Liane Comber, NP  Ascorbic Acid (VITAMIN C) 1000 MG tablet Take 1,000 mg by mouth in the morning and at bedtime.    [provider]  aspirin 81 MG tablet Take 81 mg by mouth daily.    [provider]  BIOTIN PO Take 1 tablet by mouth daily.    [provider]  carvedilol (COREG) 12.5 MG tablet TAKE ONE TABLET BY MOUTH TWICE A DAY 11/01/21   Leonie Man, MD  ezetimibe  (ZETIA) 10 MG tablet TAKE ONE TABLET BY MOUTH DAILY 12/09/21   Alycia Rossetti, NP  famotidine (PEPCID) 20 MG tablet Take 1 tablet (20 mg total) by mouth as needed for heartburn or indigestion. 02/15/22   Levin Erp, PA  Magnesium 400 MG TABS Take 400 mg by mouth daily.    [provider]  olmesartan-hydrochlorothiazide (BENICAR HCT) 40-25 MG tablet TAKE ONE TABLET BY MOUTH DAILY FOR BLOOD PRESSURE 09/17/21   Liane Comber, NP  pantoprazole (PROTONIX) 40 MG tablet Take 1 tablet (40 mg total) by mouth 2 (two) times daily before a meal. Take 30 minutes before breakfast and 30 minutes before dinner every day. 02/15/22   Levin Erp, PA  rosuvastatin (CRESTOR) 40 MG tablet Take  1 tablet  Daily  for Cholesterol                                                         /                                 TAKE ONE TABLET BY MOUTH 06/13/21   Unk Pinto, MD  Semaglutide, 1 MG/DOSE, (OZEMPIC, 1 MG/DOSE,) 4 MG/3ML SOPN Inject 1 mg into the skin once a week. Patient not taking: Reported on 03/10/2022 09/02/21   Liane Comber, NP  Thiamine HCl (VITAMIN B-1) 250 MG tablet Take 250 mg by mouth daily.    [provider]  vitamin E 200 UNIT capsule Take 200 Units by mouth 2 (two) times daily.    [provider]  Zinc 50 MG TABS Take 50 mg by mouth daily.     [provider]                                                                                                                                    Past Surgical History Past Surgical History:  Procedure Laterality Date   ABDOMINAL SURGERY  1956   Related to bleeding episode.  BIOPSY  01/27/2020   Procedure: BIOPSY;  Surgeon: Rush Landmark Telford Nab., MD;  Location: [facility];  Service: Gastroenterology;;   BIOPSY  01/21/2021   Procedure: BIOPSY;  Surgeon: Irving Copas., MD;  Location: [place];  Service: Gastroenterology;;   CARDIAC CATHETERIZATION  2005   no intervention per  patient   CARPAL TUNNEL RELEASE Left 03/06/2020   Dr. Amedeo Plenty   COCHLEAR IMPLANT Bilateral    COLONOSCOPY     several - Last one 11/22/19   CYST REMOVAL HAND Left 1975   [place]   ENDOSCOPIC MUCOSAL RESECTION N/A 01/27/2020   Procedure: ENDOSCOPIC MUCOSAL RESECTION;  Surgeon: Irving Copas., MD;  Location: [place];  Service: Gastroenterology;  Laterality: N/A;   ENTEROSCOPY N/A 01/27/2020   Procedure: ENTEROSCOPY;  Surgeon: Rush Landmark Telford Nab., MD;  Location: [place];  Service: Gastroenterology;  Laterality: N/A;   ESOPHAGOGASTRODUODENOSCOPY (EGD) WITH PROPOFOL N/A 01/21/2021   Procedure: ESOPHAGOGASTRODUODENOSCOPY (EGD) WITH PROPOFOL;  Surgeon: Rush Landmark Telford Nab., MD;  Location: [facility];  Service: Gastroenterology;  Laterality: N/A;   HEMOSTASIS CLIP PLACEMENT  01/27/2020   Procedure: HEMOSTASIS CLIP PLACEMENT;  Surgeon: Rush Landmark Telford Nab., MD;  Location: [place];  Service: Gastroenterology;;   SKIN LESION EXCISION     STAPEDECTOMY Bilateral    x 2 - 10 yrs apart - No MRIs per patient.   SUBMUCOSAL LIFTING INJECTION  01/27/2020   Procedure: SUBMUCOSAL LIFTING INJECTION;  Surgeon: Rush Landmark Telford Nab., MD;  Location: [place];  Service: Gastroenterology;;   SUBMUCOSAL TATTOO INJECTION  01/27/2020   Procedure: SUBMUCOSAL TATTOO INJECTION;  Surgeon: Irving Copas., MD;  Location: [place];  Service: Gastroenterology;;   TRANSTHORACIC ECHOCARDIOGRAM  07/2017   Normal LV size and function.  EF 55-60%.  No RWMA.  Basal septal hypertrophy, but no sign of significant hypertensive heart disease.  Only GR 1 DD.   TRIGGER FINGER RELEASE Right 2014   Thumb   UPPER GI ENDOSCOPY     several - last one 11/22/2019   WISDOM TOOTH EXTRACTION     Family History Family History  Problem Relation Age of Onset   Heart disease Mother        Atrial flutter   COPD Mother    Heart failure Mother    Kidney disease Father    Arrhythmia Brother     Heart attack Brother    Hypertension Brother    Hyperlipidemia Brother    Hypertension Brother    Hyperlipidemia Brother    Esophageal cancer Neg Hx    Colon cancer Neg Hx     Social History Social History   Tobacco Use   Smoking status: Never   Smokeless tobacco: Never  Vaping Use   Vaping Use: Never used  Substance Use Topics   Alcohol use: Yes    Comment: occasional wine   Drug use: No    Comment: In 20s - cocaine, marijuana   Allergies Patient has no known allergies.  Review of Systems Review of Systems  Physical Exam Vital Signs  I have reviewed the triage vital signs BP (!) 150/87   Pulse 65   Temp 97.8 F (36.6 C) (Oral)   Resp 14   Ht '6\' 1"'$  (1.854 m)   Wt 102.1 kg   SpO2 97%   BMI 29.69 kg/m  Physical Exam Vitals and nursing note reviewed.  Constitutional:      General: He is not in acute distress.    Appearance: Normal appearance.  HENT:     Mouth/Throat:  Mouth: Mucous membranes are moist.  Eyes:     Conjunctiva/sclera: Conjunctivae normal.  Cardiovascular:     Rate and Rhythm: Normal rate and regular rhythm.  Pulmonary:     Effort: Pulmonary effort is normal. No respiratory distress.     Breath sounds: Normal breath sounds.  Abdominal:     General: Abdomen is flat. There is no distension.     Palpations: Abdomen is soft.     Tenderness: There is abdominal tenderness (mild epigastric/LUQ).  Musculoskeletal:     Right lower leg: No edema.     Left lower leg: No edema.  Skin:    General: Skin is warm and dry.     Capillary Refill: Capillary refill takes less than 2 seconds.  Neurological:     Mental Status: He is alert and oriented to person, place, and time. Mental status is at baseline.  Psychiatric:        Mood and Affect: Mood normal.        Behavior: Behavior normal.     ED Results and Treatments Labs (all labs ordered are listed, but only abnormal results are displayed) Labs Reviewed  COMPREHENSIVE METABOLIC PANEL -  Abnormal; Notable for the following components:      Result Value   Glucose, Bld 111 (*)    Alkaline Phosphatase 35 (*)    Anion gap 4 (*)    All other components within normal limits  CBC WITH DIFFERENTIAL/PLATELET - Abnormal; Notable for the following components:   Monocytes Absolute 1.2 (*)    All other components within normal limits  LIPASE, BLOOD  TROPONIN I (HIGH SENSITIVITY)                                                                                                                          Radiology CT Abdomen Pelvis W Contrast  Result Date: 04/17/2022 CLINICAL DATA:  71 year old male with abdominal pain and nausea. EXAM: CT ABDOMEN AND PELVIS WITH CONTRAST TECHNIQUE: Multidetector CT imaging of the abdomen and pelvis was performed using the standard protocol following bolus administration of intravenous contrast. RADIATION DOSE REDUCTION: This exam was performed according to the departmental dose-optimization program which includes automated exposure control, adjustment of the mA and/or kV according to patient size and/or use of iterative reconstruction technique. CONTRAST:  170m OMNIPAQUE IOHEXOL 300 MG/ML  SOLN COMPARISON:  None Available. FINDINGS: Lower chest: Minimal RIGHT basilar atelectasis/scarring noted. Hepatobiliary: The liver is unremarkable. Equivocal tiny gallstones noted without CT evidence of acute cholecystitis. There is no evidence of intrahepatic or extrahepatic biliary dilatation. Pancreas: Unremarkable Spleen: Unremarkable Adrenals/Urinary Tract: No significant renal, adrenal or bladder abnormalities identified. Stomach/Bowel: Stomach is within normal limits. No hiatal hernia identified. Appendix not identified. No evidence of bowel wall thickening, distention, or inflammatory changes. Colonic diverticulosis identified without evidence of acute diverticulitis. Vascular/Lymphatic: Aortic atherosclerosis. No enlarged abdominal or pelvic lymph nodes. Reproductive:  Mild prostate enlargement noted. Other: No ascites, focal collection or pneumoperitoneum. Small inguinal hernias containing  fat are noted. Musculoskeletal: No acute or suspicious bony abnormalities are noted. IMPRESSION: 1. No evidence of acute abnormality. 2. Equivocal tiny gallstones - no CT evidence of acute cholecystitis. 3. Colonic diverticulosis without evidence of acute diverticulitis. 4. Small inguinal hernias containing fat. 5.  Aortic Atherosclerosis (ICD10-I70.0). Electronically Signed   By: Margarette Canada M.D.   On: 04/17/2022 09:06   DG Chest Port 1 View  Result Date: 04/17/2022 CLINICAL DATA:  Nausea and chest discomfort. EXAM: PORTABLE CHEST 1 VIEW COMPARISON:  07/26/2018 FINDINGS: The cardiomediastinal silhouette is unremarkable. There is no evidence of focal airspace disease, pulmonary edema, suspicious pulmonary nodule/mass, pleural effusion, or pneumothorax. No acute bony abnormalities are identified. IMPRESSION: No active disease. Electronically Signed   By: Margarette Canada M.D.   On: 04/17/2022 08:59    Pertinent labs & imaging results that were available during my care of the patient were reviewed by me and considered in my medical decision making (see MDM for details).  Medications Ordered in ED Medications  0.9 %  sodium chloride infusion (0 mLs Intravenous Stopped 04/17/22 0918)  famotidine (PEPCID) IVPB 20 mg premix (0 mg Intravenous Stopped 04/17/22 0850)  metoCLOPramide (REGLAN) injection 10 mg (10 mg Intravenous Given 04/17/22 0802)  sodium chloride 0.9 % bolus 1,000 mL (0 mLs Intravenous Stopped 04/17/22 0918)  iohexol (OMNIPAQUE) 300 MG/ML solution 100 mL (100 mLs Intravenous Contrast Given 04/17/22 0835)  lidocaine (XYLOCAINE) 2 % viscous mouth solution 15 mL (15 mLs Mouth/Throat Given 04/17/22 1655)                                                                                                                                     Procedures Procedures  (including  critical care time)  Medical Decision Making / ED Course   MDM:  71 year old male presenting to the emergency department for nausea and vomiting.  Differential includes worsening reflux, intra-abdominal process such as infection, obstruction, volvulus, gastric outlet obstruction, abscess, less likely ACS.  Will obtain labs including CMP and lipase, will obtain CT scan.  Given medical history will obtain troponin but lower concern for ACS, also obtain EKG, CXR.  We will treat symptoms with antinausea and antacids.  Will reassess.  Clinical Course as of 04/17/22 3748  Nathaniel Gill Apr 17, 2022  0930 Reviewed results.  Notable for no significant electrolyte abnormality, no AKI, CBC reassuring.  CT abdomen and pelvis read as without acute process.  Reviewed CT scan, patient appears to have a large stomach and has not eaten recently, patient recently on Ozempic, concern for possible gastroparesis reviewed chart including communications with gastroenterology who had suspect this might be because of patient's symptoms.  Will encourage follow-up with his gastroenterologist for further management and diagnosis.  Will prescribe Reglan for promotility action and viscous lidocaine for sore throat.  Discussed return precautions for any worsening symptoms, inability to tolerate p.o.'s. Will discharge patient to home. All questions answered.  Patient comfortable with plan of discharge. Return precautions discussed with patient and specified on the after visit summary.  [WS]    Clinical Course User Index [WS] Cristie Hem, MD     Additional history obtained: -Additional history obtained from family -External records from outside source obtained and reviewed including: Chart review including previous notes, labs, imaging, consultation notes including GI visit 02/15/22   Lab Tests: -I ordered, reviewed, and interpreted labs.   The pertinent results include:   Labs Reviewed  COMPREHENSIVE METABOLIC PANEL -  Abnormal; Notable for the following components:      Result Value   Glucose, Bld 111 (*)    Alkaline Phosphatase 35 (*)    Anion gap 4 (*)    All other components within normal limits  CBC WITH DIFFERENTIAL/PLATELET - Abnormal; Notable for the following components:   Monocytes Absolute 1.2 (*)    All other components within normal limits  LIPASE, BLOOD  TROPONIN I (HIGH SENSITIVITY)    Notable for reassuring electrolytes and no dehydration  EKG   EKG Interpretation  Date/Time:  Sunday April 17 2022 08:25:42 EDT Ventricular Rate:  68 PR Interval:  188 QRS Duration: 139 QT Interval:  399 QTC Calculation: 425 R Axis:   -10 Text Interpretation: Sinus rhythm Atrial premature complex Right bundle branch block Confirmed by Garnette Gunner [phone]) on 04/17/2022 8:29:01 AM         Imaging Studies ordered: I ordered imaging studies including CT a/p On my interpretation imaging demonstrates no acute process but large stomach with contents  I independently visualized and interpreted imaging. I agree with the radiologist interpretation   Medicines ordered and prescription drug management: Meds ordered this encounter  Medications   famotidine (PEPCID) IVPB 20 mg premix   metoCLOPramide (REGLAN) injection 10 mg   sodium chloride 0.9 % bolus 1,000 mL   0.9 %  sodium chloride infusion    Carrier Fluid Protocol   iohexol (OMNIPAQUE) 300 MG/ML solution 100 mL   lidocaine (XYLOCAINE) 2 % viscous mouth solution 15 mL   metoCLOPramide (REGLAN) 10 MG tablet    Sig: Take 1 tablet (10 mg total) by mouth every 6 (six) hours.    Dispense:  30 tablet    Refill:  0   lidocaine (XYLOCAINE) 2 % solution    Sig: Use as directed 15 mLs in the mouth or throat as needed for mouth pain.    Dispense:  100 mL    Refill:  0    -I have reviewed the patients home medicines and have made adjustments as needed   Cardiac Monitoring: The patient was maintained on a cardiac monitor.  I personally  viewed and interpreted the cardiac monitored which showed an underlying rhythm of: NSR    Reevaluation: After the interventions noted above, I reevaluated the patient and found that they have stayed the same  Co morbidities that complicate the patient evaluation  Past Medical History:  Diagnosis Date   Arthritis    CAD (coronary artery disease) 07/13/2021   Cor Ca++ Score 837.  Mixed mod plaque prox RCA(50 to 69%).  Mild Ca++ plaque mid RCA (25-49%).  Minimal Ca++ plaque prox LCx (0-24%).  Mid LCx CA++ plaque (50 to 69%), Prox OM1 CA++ plaque (50-69%).  Prox LAD mild to 0-24%. LM  mild 0-24% Ca++  plaque. => CT FFR + for distal LCx (small caliber)  Negative findings in the LAD, OM1 and  and RCA. =>  Would not recommend catheterization.  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 Hilarie Fredrickson:  Low-grade of narrowing Schatzki ring. Dilated to 18 mm with balloon. - 3 cm hiatal hernia. - Normal stomach. 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    GERD (gastroesophageal reflux disease)    Hiatal hernia    History of colon polyps    Hyperlipidemia    Hypertension    No sign of renal artery stenosis   IBS (irritable bowel syndrome)    RBBB    Systolic murmur    no  current problems   Vertigo    Vitamin D deficiency       Dispostion: Disposition decision including need for hospitalization was considered, and patient discharged from emergency department.    Final Clinical Impression(s) / ED Diagnoses Final diagnoses:  Gastroparesis  Gastroesophageal reflux disease without esophagitis     This chart was dictated using voice recognition software.  Despite best efforts to proofread,  errors can occur which can change the documentation meaning.    Cristie Hem,  MD 04/17/22 [phone]

## 2022-04-18 NOTE — Telephone Encounter (Signed)
I do not think these symptoms have anything to do with heart valve issue.  We will probably order it with diet and GI issues.  Lying on the left side probably makes it worse because that facilitates drainage of the stomach.  Would notify her PCP and her GI doctor.   [name], MD

## 2022-04-18 NOTE — Progress Notes (Unsigned)
04/21/2022 [name] [id number] 1951-02-19  Referring provider: Unk Pinto, MD Primary GI doctor: Dr. 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 - EGD to evaluate for objective evidence of reflux, will discuss if patient would need  bravo? And May need hiatal hernia repair with it.  -Will get HIDA to rule out chronic cholecystitis since patient is interested in surgical options for GERD Will discus with Dr. Hilarie Fredrickson and consider follow up with Dr. Cathleen Corti to discuss TIF Given information Changing insurance in March and interested in [place] before that time.  Consider GES with diabetes but well controlled  History of adenomatous polyp of colon 11/22/2019 colonoscopy with 2 adenomatous polyps removed in the ascending colon and one hyperplastic polyp and 1 adenomatous polyp removed from the descending and sigmoid colon.  Repeat recommended in 3 years. (11/2022)  CAD via calcium score On preventative medications, no chest pain or SOB  DM2 Well controlled at 5.9   History of Present Illness:  71 y.o. male with a past medical history of coronary disease, GERD, history of GI bleed secondary to Meckel's diverticulum status postsurgical resection, duodenal adenoma and colon polyps  and others listed below, presents for hospital follow up.   09/2019 barium  swallow small hiatal hernia, moderate distal esophageal mucosal ring, brief sticking 11/22/2019 colonoscopy with 2 adenomatous polyps removed in the ascending colon and one hyperplastic polyp and 1 adenomatous polyp removed from the descending and sigmoid colon.  Repeat recommended in 3 years. 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  Repeat EGD was recommended in 2 years ( 01/2023)   02/15/2022 office visit with Ellouise Newer, PA for abdominal pain, gas bloating, change in bowel habits, suggested stopping Ozempic which patient started in March.  Patient also had COVID-positive.  Protonix 40 mg twice daily added on Pepcid for breakthrough reflux.  KUB moderate stool nonobstructing gas.    Thursday he purchased a bag of the life safer mint, ate most of them,  and thinks that triggered this. Then Saturday he started to have increased reflux.  Then in the middle of the night he regurgitated acid into his mouth with his throat burning Saturday, he went Sunday to ER.  Had some nausea that evening, spitting up white foam.  Rare dysphagia, states if he has rice with meal that is worse.   Labs in the ER showed negative troponin, normal lipase.  No leukocytosis, no anemia, BUN 20, creatinine 0.93, alk phos low at 35, AST 25, ALT 31. CT abdomen pelvis with contrast showed no acute abnormality, clinical tiny gallstones but no evidence of cholecystitis, diverticulosis without diverticulitis, small inguinal hernias containing fat, aortic atherosclerosis. Lidocaine mixture helped the most with his symptoms.  States prior to this he had not had BM for  3-4 days, since then he has had more BM's. Will take miralax as needed but not on this at this time.   He has a hard time taking the pantoprazole daily, willl take usually once at night,  occ will miss. Occ will take pepcid as needed, once a week.   He  reports that he has never smoked. He has never used smokeless tobacco. He reports current alcohol use. He reports that he does not use drugs. His family history includes Arrhythmia in his brother; COPD in his mother; Heart attack in his brother; Heart disease in his mother; Heart failure in his mother; Hyperlipidemia in his brother and brother; Hypertension in his brother and brother; Kidney disease in his father.   Current Medications:    Current Outpatient Medications (Cardiovascular):    amLODipine (NORVASC) 10 MG tablet, TAKE ONE TABLET BY MOUTH DAILY FOR BLOOD PRESSURE   atorvastatin (LIPITOR) 40 MG tablet, 40 mg daily.   carvedilol (COREG) 12.5 MG tablet, TAKE ONE TABLET BY MOUTH TWICE A DAY   ezetimibe (ZETIA) 10 MG tablet, TAKE ONE TABLET BY MOUTH DAILY   olmesartan-hydrochlorothiazide (BENICAR HCT) 40-25 MG tablet, TAKE ONE TABLET BY MOUTH DAILY FOR BLOOD PRESSURE   rosuvastatin (CRESTOR) 40 MG tablet, Take  1 tablet  Daily  for Cholesterol                                                         /                                 TAKE ONE TABLET BY MOUTH   Current Outpatient Medications (Analgesics):    aspirin 81 MG tablet, Take 81 mg by mouth daily.   Current Outpatient Medications (Other):    Ascorbic Acid (VITAMIN C) 1000 MG tablet, Take 1,000 mg by mouth in the morning and at bedtime.   BIOTIN PO, Take 1 tablet by mouth daily.   famotidine (PEPCID) 20 MG tablet, TAKE 1 TABLET BY MOUTH DAILY AS NEEDED FOR HEARTBURN OR INDIGESTION   Magnesium 400 MG TABS, Take 400 mg by mouth daily.   metoCLOPramide (REGLAN) 10 MG tablet, Take 1 tablet (10 mg total) by mouth every 6 (six) hours.   pantoprazole (PROTONIX) 40 MG tablet, Take 1 tablet (40 mg total) by mouth 2 (two) times daily before a meal. Take 30 minutes before breakfast and 30 minutes before dinner every day.   Thiamine HCl (VITAMIN B-1) 250 MG tablet, Take 250  mg by mouth daily.   vitamin E 200 UNIT capsule, Take 200 Units by mouth 2 (two) times daily.   Zinc 50 MG TABS, Take 50 mg by mouth daily.    lidocaine (XYLOCAINE) 2 % solution, Use as directed 15 mLs in the mouth or throat as needed for mouth pain. (Patient not taking: Reported on 04/21/2022)  Surgical History:  He  has a past surgical history that includes Cardiac catheterization (2005); Abdominal surgery (1956); Stapedectomy (Bilateral); Trigger finger release (Right, 2014); Cyst removal hand (Left, 1975); Skin lesion excision; transthoracic echocardiogram (07/2017); Colonoscopy; Upper gi endoscopy; Wisdom tooth extraction; Endoscopic mucosal resection (N/A, 01/27/2020); biopsy (01/27/2020); Submucosal lifting injection (01/27/2020); Submucosal tattoo injection (01/27/2020); Hemostasis clip placement (01/27/2020); enteroscopy (N/A, 01/27/2020); Carpal tunnel release (Left,  03/06/2020); Cochlear implant (Bilateral); Esophagogastroduodenoscopy (egd) with propofol (N/A, 01/21/2021); and biopsy (01/21/2021).  Current Medications, Allergies, Past Medical History, Past Surgical History, Family History and Social History were reviewed in [employer] record.  Physical Exam: BP (!) 140/70 (BP Location: Left Arm, Patient Position: Sitting, Cuff Size: Normal)   Pulse 68   Ht 6' (1.829 m)   Wt 226 lb 8 oz (102.7 kg)   SpO2 97%   BMI 30.72 kg/m  General:   Pleasant, well developed male in no acute distress Heart : Regular rate and rhythm; no murmurs Pulm: Clear anteriorly; no wheezing Abdomen:  Soft, Obese AB, Active bowel sounds. mild tenderness in the epigastrium and in the RUQ. Without guarding and Without rebound, No organomegaly appreciated. Rectal: Not evaluated Extremities:  without  edema. Neurologic:  Alert and  oriented x4;  No focal deficits.  Psych:  Cooperative. Normal mood and affect.   Vladimir Crofts, PA-C 04/21/22

## 2022-04-20 NOTE — Telephone Encounter (Signed)
Sent message to patient

## 2022-04-21 ENCOUNTER — Encounter: Payer: Self-pay | Admitting: Physician Assistant

## 2022-04-21 ENCOUNTER — Ambulatory Visit (INDEPENDENT_AMBULATORY_CARE_PROVIDER_SITE_OTHER): Payer: Medicare Other | Admitting: Physician Assistant

## 2022-04-21 VITALS — BP 140/70 | HR 68 | Ht 72.0 in | Wt 226.5 lb

## 2022-04-21 DIAGNOSIS — K317 Polyp of stomach and duodenum: Secondary | ICD-10-CM

## 2022-04-21 DIAGNOSIS — R1319 Other dysphagia: Secondary | ICD-10-CM

## 2022-04-21 DIAGNOSIS — K2100 Gastro-esophageal reflux disease with esophagitis, without bleeding: Secondary | ICD-10-CM | POA: Diagnosis not present

## 2022-04-21 DIAGNOSIS — Z86010 Personal history of colonic polyps: Secondary | ICD-10-CM

## 2022-04-21 DIAGNOSIS — K449 Diaphragmatic hernia without obstruction or gangrene: Secondary | ICD-10-CM | POA: Diagnosis not present

## 2022-04-21 NOTE — Patient Instructions (Addendum)
_______________________________________________________  If you are age 69 or older, your body mass index should be between 23-30. Your Body mass index is 30.72 kg/m. If this is out of the aforementioned range listed, please consider follow up with your Primary Care Provider.  If you are age 85 or younger, your body mass index should be between 19-25. Your Body mass index is 30.72 kg/m. If this is out of the aformentioned range listed, please consider follow up with your Primary Care Provider.   ________________________________________________________  The [place] [facility] providers would like to encourage you to use [facility] to communicate with providers for non-urgent requests or questions.  Due to long hold times on the telephone, sending your provider a message by [facility] may be a faster and more efficient way to get a response.  Please allow 48 business hours for a response.  Please remember that this is for non-urgent requests.  _______________________________________________________  Dennis Bast have been scheduled for an endoscopy. Please follow written instructions given to you at your visit today. If you use inhalers (even only as needed), please bring them with you on the day of your procedure.  Please take your proton pump inhibitor medication, PANTOPRAZOLE 40 MG TWICE A DAY AT LEAST 6-8 WEEKS - DON'T CARE WHEN - THEN GO TO ONCE A DAY Avoid spicy and acidic foods Avoid fatty foods Limit your intake of coffee, tea, alcohol, and carbonated drinks Work to maintain a healthy weight Keep the head of the bed elevated at least 3 inches with blocks or a wedge pillow if you are having any nighttime symptoms Stay upright for 2 hours after eating Avoid meals and snacks three to four hours before bedtime  Miralax is an osmotic laxative.  It only brings more water into the stool.  This is safe to take daily.  Can take up to 17 gram of miralax twice a day.  Mix with juice or coffee.  Start 1 capful at  night for 3-4 days and reassess your response in 3-4 days.  You can increase and decrease the dose based on your response.  Remember, it can take up to 3-4 days to take effect OR for the effects to wear off.   I often pair this with benefiber in the morning to help assure the stool is not too loose.   For more information on the TIF procedure please visit the website at www.[employer]ingMag[employer].si. If you would like to view an informative video regarding the TIF procedure, please visit:  https://vimeo.[email or web address]/696789381.  Toileting tips to help with your constipation - Drink at least 64-80 ounces of water/liquid per day. - Establish a time to try to move your bowels every day.  For many people, this is after a cup of coffee or after a meal such as breakfast. - Sit all of the way back on the toilet keeping your back fairly straight and while sitting up, try to rest the tops of your forearms on your upper thighs.   - Raising your feet with a step stool/squatty potty can be helpful to improve the angle that allows your stool to pass through the rectum. - Relax the rectum feeling it bulge toward the toilet water.  If you feel your rectum raising toward your body, you are contracting rather than relaxing. - Breathe in and slowly exhale. "Belly breath" by expanding your belly towards your belly button. Keep belly expanded as you gently direct pressure down and back to the anus.  A low pitched GRRR sound can assist with  increasing intra-abdominal pressure.  - Repeat 3-4 times. If unsuccessful, contract the pelvic floor to restore normal tone and get off the toilet.  Avoid excessive straining. - To reduce excessive wiping by teaching your anus to normally contract, place hands on outer aspect of knees and resist knee movement outward.  Hold 5-10 second then place hands just inside of knees and resist inward movement of knees.  Hold 5 seconds.  Repeat a few times each way.  It was a pleasure to see you today!  Thank you  for trusting me with your gastrointestinal care!

## 2022-04-21 NOTE — Progress Notes (Signed)
Addendum: Reviewed and agree with assessment and management plan. Patient has a large hiatal hernia, 5 cm, which is likely the cause of reflux.  Twice daily PPI may provide some increased benefit but structural issues, hiatal hernia, lead to ongoing reflux.  The patient would not be a candidate for TIF as this can only treat hernias less than 2 cm.  Combined surgical approach for hiatal hernia repair and TIF may be an option. If he remains interested in having hiatal hernia fixed which would very likely fix reflux I would have him seen by Dr. Redmond Pulling with general surgery.  He would likely need presurgical esophageal manometry.  Regarding history of duodenal adenoma, surveillance did not show any residual adenoma as of July 2022. It would be reasonable to repeat upper endoscopy at the same time as repeat surveillance colonoscopy around May 2024  Lus Kriegel, Lajuan Lines, MD

## 2022-04-22 ENCOUNTER — Other Ambulatory Visit: Payer: Self-pay | Admitting: Cardiology

## 2022-04-26 DIAGNOSIS — M5416 Radiculopathy, lumbar region: Secondary | ICD-10-CM | POA: Diagnosis not present

## 2022-04-28 ENCOUNTER — Encounter: Payer: Self-pay | Admitting: Internal Medicine

## 2022-04-28 ENCOUNTER — Ambulatory Visit: Payer: BC Managed Care – PPO | Admitting: Physician Assistant

## 2022-05-02 ENCOUNTER — Encounter: Payer: Self-pay | Admitting: Internal Medicine

## 2022-05-03 DIAGNOSIS — M5416 Radiculopathy, lumbar region: Secondary | ICD-10-CM | POA: Diagnosis not present

## 2022-05-11 ENCOUNTER — Encounter (HOSPITAL_COMMUNITY)
Admission: RE | Admit: 2022-05-11 | Discharge: 2022-05-11 | Disposition: A | Discharge status: Home / Self Care | Payer: BC Managed Care – PPO | Source: Ambulatory Visit | Attending: Physician Assistant | Admitting: Physician Assistant

## 2022-05-11 DIAGNOSIS — R1011 Right upper quadrant pain: Secondary | ICD-10-CM | POA: Diagnosis not present

## 2022-05-11 DIAGNOSIS — K2100 Gastro-esophageal reflux disease with esophagitis, without bleeding: Secondary | ICD-10-CM | POA: Diagnosis not present

## 2022-05-11 MED ORDER — TECHNETIUM TC 99M MEBROFENIN IV KIT
5.5000 | PACK | Freq: Once | INTRAVENOUS | Status: AC
  Administered 2022-05-11: 5.5 via INTRAVENOUS

## 2022-05-13 ENCOUNTER — Ambulatory Visit (AMBULATORY_SURGERY_CENTER): Payer: BC Managed Care – PPO | Admitting: Internal Medicine

## 2022-05-13 ENCOUNTER — Encounter: Payer: Self-pay | Admitting: Internal Medicine

## 2022-05-13 VITALS — BP 126/81 | HR 64 | Temp 98.4°F | Resp 16 | Ht 72.0 in | Wt 226.0 lb

## 2022-05-13 DIAGNOSIS — K219 Gastro-esophageal reflux disease without esophagitis: Secondary | ICD-10-CM

## 2022-05-13 DIAGNOSIS — D181 Lymphangioma, any site: Secondary | ICD-10-CM

## 2022-05-13 DIAGNOSIS — K317 Polyp of stomach and duodenum: Secondary | ICD-10-CM

## 2022-05-13 DIAGNOSIS — K449 Diaphragmatic hernia without obstruction or gangrene: Secondary | ICD-10-CM | POA: Diagnosis not present

## 2022-05-13 DIAGNOSIS — R1319 Other dysphagia: Secondary | ICD-10-CM

## 2022-05-13 DIAGNOSIS — I890 Lymphedema, not elsewhere classified: Secondary | ICD-10-CM | POA: Diagnosis not present

## 2022-05-13 DIAGNOSIS — K222 Esophageal obstruction: Secondary | ICD-10-CM

## 2022-05-13 DIAGNOSIS — R1310 Dysphagia, unspecified: Secondary | ICD-10-CM | POA: Diagnosis not present

## 2022-05-13 MED ORDER — SODIUM CHLORIDE 0.9 % IV SOLN: 500.0000 mL | Freq: Once | INTRAVENOUS | Status: DC

## 2022-05-13 NOTE — Progress Notes (Signed)
Called to room to assist during endoscopic procedure.  Patient ID and intended procedure confirmed with present staff. Received instructions for my participation in the procedure from the performing physician.  

## 2022-05-13 NOTE — Progress Notes (Signed)
To pacu, VSS. Report to [email or web address].tb 

## 2022-05-13 NOTE — Progress Notes (Signed)
See office note dated 04/21/2022 for details and current H&P  Patient presenting for upper endoscopy in the [facility] and he remains appropriate for procedure today

## 2022-05-13 NOTE — Patient Instructions (Signed)
Thank you for letting us take care of your healthcare needs today. Please see handouts given to you on Post Dilation Diet. Continue present medications including PPI. Await pathology results.   YOU HAD AN ENDOSCOPIC PROCEDURE TODAY AT [facility]:   Refer to the procedure report that was given to you for any specific questions about what was found during the examination.  If the procedure report does not answer your questions, please call your gastroenterologist to clarify.  If you requested that your care partner not be given the details of your procedure findings, then the procedure report has been included in a sealed envelope for you to review at your convenience later.  YOU SHOULD EXPECT: Some feelings of bloating in the abdomen. Passage of more gas than usual.  Walking can help get rid of the air that was put into your GI tract during the procedure and reduce the bloating. If you had a lower endoscopy (such as a colonoscopy or flexible sigmoidoscopy) you may notice spotting of blood in your stool or on the toilet paper. If you underwent a bowel prep for your procedure, you may not have a normal bowel movement for a few days.  Please Note:  You might notice some irritation and congestion in your nose or some drainage.  This is from the oxygen used during your procedure.  There is no need for concern and it should clear up in a day or so.  SYMPTOMS TO REPORT IMMEDIATELY:  Following upper endoscopy (EGD)  Vomiting of blood or coffee ground material  New chest pain or pain under the shoulder blades  Painful or persistently difficult swallowing  New shortness of breath  Fever of 100F or higher  Black, tarry-looking stools  For urgent or emergent issues, a gastroenterologist can be reached at any hour by calling [phone]. Do not use MyChart messaging for urgent concerns.    DIET:  Nothing by mouth until 5 pm then Clear Liquid diet for an hour followed by Soft Diet at  6 pm. Tomorrow you may proceed to your regular diet.  Drink plenty of fluids but you should avoid alcoholic beverages for 24 hours.  ACTIVITY:  You should plan to take it easy for the rest of today and you should NOT DRIVE or use heavy machinery until tomorrow (because of the sedation medicines used during the test).    FOLLOW UP: Our staff will call the number listed on your records the next business day following your procedure.  We will call around 7:15- 8:00 am to check on you and address any questions or concerns that you may have regarding the information given to you following your procedure. If we do not reach you, we will leave a message.     If any biopsies were taken you will be contacted by phone or by letter within the next 1-3 weeks.  Please call us at [phone] if you have not heard about the biopsies in 3 weeks.    SIGNATURES/CONFIDENTIALITY: You and/or your care partner have signed paperwork which will be entered into your electronic medical record.  These signatures attest to the fact that that the information above on your After Visit Summary has been reviewed and is understood.  Full responsibility of the confidentiality of this discharge information lies with you and/or your care-partner.

## 2022-05-13 NOTE — Op Note (Signed)
[facility] Patient Name: Nathaniel Gill Procedure Date: 05/13/2022 3:24 PM MRN: [id number] Endoscopist: Jerene Bears , MD, [id number] Age: 71 Referring MD:  Date of Birth: 1950-09-14 Gender: Male Account #: [id number] Procedure:                Upper GI endoscopy Indications:              Dysphagia, Gastro-esophageal reflux disease,                            history of adenomatous of polyp in the duodenum;                            heartburn and reflux symptoms much improved with                            BID PPI and propping head of bed at night Medicines:                Monitored Anesthesia Care Procedure:                Pre-Anesthesia Assessment:                           - Prior to the procedure, a History and Physical                            was performed, and patient medications and                            allergies were reviewed. The patient's tolerance of                            previous anesthesia was also reviewed. The risks                            and benefits of the procedure and the sedation                            options and risks were discussed with the patient.                            All questions were answered, and informed consent                            was obtained. Prior Anticoagulants: The patient has                            taken no anticoagulant or antiplatelet agents. ASA                            Grade Assessment: II - A patient with mild systemic                            disease. After reviewing the risks and benefits,  the patient was deemed in satisfactory condition to                            undergo the procedure.                           After obtaining informed consent, the endoscope was                            passed under direct vision. Throughout the                            procedure, the patient's blood pressure, pulse, and                            oxygen saturations were  monitored continuously. The                            0405 PCF-H190TL Slim SB Colonoscope was introduced                            through the mouth, and advanced to the third part                            of duodenum. The upper GI endoscopy was                            accomplished without difficulty. The patient                            tolerated the procedure well. Scope In: Scope Out: Findings:                 A low-grade of narrowing Schatzki ring was found at                            the gastroesophageal junction. A TTS dilator was                            passed through the scope. Dilation with a 16-17-18                            mm balloon dilator was performed to 18 mm. The                            dilation site was examined and showed mild mucosal                            disruption.                           A 4 cm hiatal hernia was found. The proximal extent  of the gastric folds (end of tubular esophagus) was                            43 cm from the incisors. The hiatal narrowing was                            39 cm from the incisors. The Z-line was 39 cm from                            the incisors.                           The gastroesophageal flap valve was visualized                            endoscopically and classified as Hill Grade IV (no                            fold, wide open lumen, hiatal hernia present).                           The entire examined stomach was normal.                           A tattoo was seen in the second portion of the                            duodenum.                           Polypoid mucosa was found in localized area of the                            second portion of the duodenum. Not definitely in                            same area as previously polypectomy. Biopsy was                            taken with a cold forceps for histology to exclude                             adenoma.                           Lymphangiectasia was present in the second portion                            of the duodenum. Complications:            No immediate complications. Estimated Blood Loss:     Estimated blood loss was minimal. Impression:               - Low-grade of narrowing Schatzki ring. Dilated to  18 mm with balloon.                           - 4 cm hiatal hernia.                           - Normal stomach.                           - A tattoo was seen in the duodenum.                           - Polypoid mucosa in the second portion of the                            duodenum. Biopsied to exclude adenoma.                           - Duodenal mucosal lymphangiectasia. Recommendation:           - Patient has a contact number available for                            emergencies. The signs and symptoms of potential                            delayed complications were discussed with the                            patient. Return to normal activities tomorrow.                            Written discharge instructions were provided to the                            patient.                           - Resume previous diet.                           - Continue present medications including BID PPI.                           - Await pathology results.                           - Office follow-up, routine, to discuss reflux and                            possible surgical correction of hiatal hernia and                            reflux. Jerene Bears, MD 05/13/2022 3:59:39 PM This report has been signed electronically.

## 2022-05-16 ENCOUNTER — Telehealth: Payer: Self-pay

## 2022-05-16 ENCOUNTER — Telehealth: Payer: Self-pay | Admitting: *Deleted

## 2022-05-16 ENCOUNTER — Other Ambulatory Visit: Payer: Self-pay

## 2022-05-16 DIAGNOSIS — K449 Diaphragmatic hernia without obstruction or gangrene: Secondary | ICD-10-CM

## 2022-05-16 DIAGNOSIS — K2100 Gastro-esophageal reflux disease with esophagitis, without bleeding: Secondary | ICD-10-CM

## 2022-05-16 NOTE — Telephone Encounter (Signed)
  Follow up Call-     05/13/2022    2:16 PM 05/13/2022    2:12 PM 11/22/2019    1:19 PM  Call back number  Post procedure Call Back phone  # [phone]  [phone]  Permission to leave phone message  Yes Yes     Patient questions:  Do you have a fever, pain , or abdominal swelling? No. Pain Score  0 *  Have you tolerated food without any problems? Yes.    Have you been able to return to your normal activities? Yes.    Do you have any questions about your discharge instructions: Diet   No. Medications  No. Follow up visit  No.  Do you have questions or concerns about your Care? No.  Actions: * If pain score is 4 or above: No action needed, pain <4.

## 2022-05-16 NOTE — Telephone Encounter (Signed)
Pt scheduled for EM at [facility] 05/18/22 at 2pm. Do you want him referred to [facility] for [facility] repair? He states he wants to have repair done before the end of December. Pt scheduled for f/u with Dr. Norman Herrlich 07/26/22 at 9:30am. Is that OV ok? Please advise.

## 2022-05-16 NOTE — Telephone Encounter (Signed)
-----   Message from Paulo Fruit, RN sent at 05/16/2022  7:46 AM EST ----- Regarding: Virgel Gess We could do the mano on 11/22 and 11/29. The 22nd would probably be better. I also could do it this Wednesday the 15th. All of them would be at 1400 unless we get a cancellation.  Vaughan Basta , are you able to check with this patient and see which would work.  Thanks. Santiago Glad  Can't believe the [employer] won on Saturday night!! ----- Message ----- From: Jerene Bears, MD Sent: 05/13/2022   4:42 PM EST To: Algernon Huxley, RN; Paulo Fruit, RN  Santiago Glad This man would like to get his DeBary repaired this year if possible Any way we can get a mano put on soon? Pre-op HH repair Thanks for your help [employer]! JMP  Rhiley Solem, not sure if ordered needed but this pt needs a mano. Thanks [employer]

## 2022-05-16 NOTE — Telephone Encounter (Signed)
-----   Message from Paulo Fruit, RN sent at 05/16/2022  7:46 AM EST ----- Regarding: Virgel Gess We could do the mano on 11/22 and 11/29. The 22nd would probably be better. I also could do it this Wednesday the 15th. All of them would be at 1400 unless we get a cancellation.  Vaughan Basta , are you able to check with this patient and see which would work.  Thanks. Santiago Glad  Can't believe the [employer] won on Saturday night!! ----- Message ----- From: Jerene Bears, MD Sent: 05/13/2022   4:42 PM EST To: Algernon Huxley, RN; Paulo Fruit, RN  Santiago Glad This man would like to get his [place] repaired this year if possible Any way we can get a mano put on soon? Pre-op HH repair Thanks for your help [employer] JMP  Jimmi Sidener, not sure if ordered needed but this pt needs a mano. Thanks [employer]

## 2022-05-16 NOTE — Telephone Encounter (Signed)
Yes, please Dr. Harlin Heys, take a look at recent EGD (I forwarded it to you Friday); sorry for double messages. Would he be candidate for combined HH repair TIF for [facility] repair [place] Thanks JMP

## 2022-05-16 NOTE — Telephone Encounter (Signed)
Paulo Fruit, RN  Pyrtle, Lajuan Lines, MD; Algernon Huxley, RN We could do the [facility] on 11/22 and 11/29. The 22nd would probably be better. I also could do it this Wednesday the 15th. All of them would be at 1400 unless we get a cancellation. Vaughan Basta , are you able to check with this patient and see which would work. Thanks. Santiago Glad  Can't believe the [employer] won on Saturday night!!       Previous Messages    ----- Message ----- From: Jerene Bears, MD Sent: 05/13/2022   4:42 PM EST To: Algernon Huxley, RN; Paulo Fruit, RN  Santiago Glad This man would like to get his [place] repaired this year if possible Any way we can get a mano put on soon? Pre-op HH repair Thanks for your help [employer] JMP  Sinead Hockman, not sure if ordered needed but this pt needs a mano. Thanks [employer] with pt and scheduled him for EM at [facility] 05/16/22 at 2pm. Pt aware, orders in epic. Instructions sent to pt via epic. Amb ref in epic.

## 2022-05-16 NOTE — Telephone Encounter (Signed)
Referral faxed to [facility] for appt with Dr. Redmond Pulling.

## 2022-05-16 NOTE — Telephone Encounter (Signed)
I think it is safe to assume this is a peptic stricture based on endoscopic appearance.

## 2022-05-18 ENCOUNTER — Ambulatory Visit (HOSPITAL_COMMUNITY)
Admission: RE | Admit: 2022-05-18 | Discharge: 2022-05-18 | Disposition: A | Discharge status: Home / Self Care | Payer: BC Managed Care – PPO | Attending: Internal Medicine | Admitting: Internal Medicine

## 2022-05-18 ENCOUNTER — Encounter (HOSPITAL_COMMUNITY)
Admission: RE | Disposition: A | Discharge status: Home / Self Care | Payer: Self-pay | Source: Home / Self Care | Attending: Internal Medicine

## 2022-05-18 DIAGNOSIS — K219 Gastro-esophageal reflux disease without esophagitis: Secondary | ICD-10-CM

## 2022-05-18 DIAGNOSIS — K449 Diaphragmatic hernia without obstruction or gangrene: Secondary | ICD-10-CM | POA: Diagnosis not present

## 2022-05-18 DIAGNOSIS — R1319 Other dysphagia: Secondary | ICD-10-CM

## 2022-05-18 HISTORY — PX: ESOPHAGEAL MANOMETRY: SHX5429

## 2022-05-18 SURGERY — MANOMETRY, ESOPHAGUS
Anesthesia: Choice

## 2022-05-18 MED ORDER — LIDOCAINE VISCOUS HCL 2 % MT SOLN
OROMUCOSAL | Status: AC
  Filled 2022-05-18: qty 15

## 2022-05-18 SURGICAL SUPPLY — 2 items
FACESHIELD LNG OPTICON STERILE (SAFETY) IMPLANT
GLOVE BIO SURGEON STRL SZ8 (GLOVE) ×2 IMPLANT

## 2022-05-18 NOTE — Progress Notes (Signed)
Esophageal Manometry done per protocol. Patient tolerated well without distress or complication.  

## 2022-05-20 ENCOUNTER — Encounter (HOSPITAL_COMMUNITY): Payer: Self-pay | Admitting: Internal Medicine

## 2022-05-20 DIAGNOSIS — K219 Gastro-esophageal reflux disease without esophagitis: Secondary | ICD-10-CM

## 2022-05-20 DIAGNOSIS — K449 Diaphragmatic hernia without obstruction or gangrene: Secondary | ICD-10-CM

## 2022-05-20 DIAGNOSIS — R1319 Other dysphagia: Secondary | ICD-10-CM

## 2022-05-21 ENCOUNTER — Encounter: Payer: Self-pay | Admitting: Internal Medicine

## 2022-06-01 ENCOUNTER — Telehealth: Payer: Self-pay

## 2022-06-01 ENCOUNTER — Other Ambulatory Visit: Payer: Self-pay

## 2022-06-01 NOTE — Telephone Encounter (Signed)
   Name: Nathaniel Gill  DOB: Oct 08, 1950  MRN: [id number]  Primary Cardiologist: [name], MD   Preoperative team, please contact this patient and set up a phone call appointment for further preoperative risk assessment. Please obtain consent and complete medication review. Thank you for your help.  I confirm that guidance regarding antiplatelet and oral anticoagulation therapy has been completed and, if necessary, noted below.(None requested)   Deberah Pelton, NP 06/01/2022, 4:31 PM [place]

## 2022-06-01 NOTE — Telephone Encounter (Unsigned)
[facility] Pre-operative Risk Assessment     Request for surgical clearance:     concomitant laparoscopic hiatal hernia repair and TIF   What type of surgery is being performed?  concomitant laparoscopic hiatal hernia repair and TIF   When is this surgery scheduled?     06/17/22  What type of clearance is required ?   Medical  Are there any medications that need to be held prior to surgery and how long? no  Practice name and name of physician performing surgery?     [facility]/ Dr. Bryan Lemma [facility] Dr. Redmond Pulling   What is your office phone and fax number?      Phone- [phone]  [phone]  Anesthesia type (None, local, MAC, general) ?       General

## 2022-06-01 NOTE — Telephone Encounter (Signed)
Cardiac clearance requested.  

## 2022-06-02 ENCOUNTER — Telehealth: Payer: Self-pay | Admitting: *Deleted

## 2022-06-02 ENCOUNTER — Telehealth: Payer: Self-pay

## 2022-06-02 NOTE — Telephone Encounter (Signed)
Pt scheduled for appointment with Dr. Bryan Lemma on 06/03/22 at 8:20 am. Pt verbalized understanding.

## 2022-06-02 NOTE — Telephone Encounter (Signed)
  Patient Consent for Virtual Visit         Nathaniel Gill has provided verbal consent on 06/02/2022 for a virtual visit (video or telephone).   CONSENT FOR VIRTUAL VISIT FOR:  Nathaniel Gill  By participating in this virtual visit I agree to the following:  I hereby voluntarily request, consent and authorize [facility] and its employed or contracted physicians, physician assistants, nurse practitioners or other licensed health care professionals (the Practitioner), to provide me with telemedicine health care services (the "Services") as deemed necessary by the treating Practitioner. I acknowledge and consent to receive the Services by the Practitioner via telemedicine. I understand that the telemedicine visit will involve communicating with the Practitioner through live audiovisual communication technology and the disclosure of certain medical information by electronic transmission. I acknowledge that I have been given the opportunity to request an in-person assessment or other available alternative prior to the telemedicine visit and am voluntarily participating in the telemedicine visit.  I understand that I have the right to withhold or withdraw my consent to the use of telemedicine in the course of my care at any time, without affecting my right to future care or treatment, and that the Practitioner or I may terminate the telemedicine visit at any time. I understand that I have the right to inspect all information obtained and/or recorded in the course of the telemedicine visit and may receive copies of available information for a reasonable fee.  I understand that some of the potential risks of receiving the Services via telemedicine include:  Delay or interruption in medical evaluation due to technological equipment failure or disruption; Information transmitted may not be sufficient (e.g. poor resolution of images) to allow for appropriate medical decision making by the  Practitioner; and/or  In rare instances, security protocols could fail, causing a breach of personal health information.  Furthermore, I acknowledge that it is my responsibility to provide information about my medical history, conditions and care that is complete and accurate to the best of my ability. I acknowledge that Practitioner's advice, recommendations, and/or decision may be based on factors not within their control, such as incomplete or inaccurate data provided by me or distortions of diagnostic images or specimens that may result from electronic transmissions. I understand that the practice of medicine is not an exact science and that Practitioner makes no warranties or guarantees regarding treatment outcomes. I acknowledge that a copy of this consent can be made available to me via my patient portal ([facility]), or I can request a printed copy by calling the office of [facility].    I understand that my insurance will be billed for this visit.   I have read or had this consent read to me. I understand the contents of this consent, which adequately explains the benefits and risks of the Services being provided via telemedicine.  I have been provided ample opportunity to ask questions regarding this consent and the Services and have had my questions answered to my satisfaction. I give my informed consent for the services to be provided through the use of telemedicine in my medical care

## 2022-06-02 NOTE — Telephone Encounter (Signed)
-----   Message from [place], DO sent at 06/02/2022 10:07 AM EST ----- Can you please get this patient an appt with me to discuss TIF since he may be a go for 12/15 depending on Cardiology (and OR)? Needs pre-op appt with me. Overbook ok.   Thanks

## 2022-06-02 NOTE — Telephone Encounter (Signed)
Telephone clearance app is made/ consent and med rec done.

## 2022-06-03 ENCOUNTER — Ambulatory Visit (INDEPENDENT_AMBULATORY_CARE_PROVIDER_SITE_OTHER): Payer: BC Managed Care – PPO | Admitting: Gastroenterology

## 2022-06-03 ENCOUNTER — Encounter: Payer: Self-pay | Admitting: Gastroenterology

## 2022-06-03 VITALS — BP 130/60 | HR 60 | Ht 72.0 in | Wt 226.8 lb

## 2022-06-03 DIAGNOSIS — K222 Esophageal obstruction: Secondary | ICD-10-CM | POA: Diagnosis not present

## 2022-06-03 DIAGNOSIS — K449 Diaphragmatic hernia without obstruction or gangrene: Secondary | ICD-10-CM | POA: Diagnosis not present

## 2022-06-03 DIAGNOSIS — R12 Heartburn: Secondary | ICD-10-CM

## 2022-06-03 DIAGNOSIS — K219 Gastro-esophageal reflux disease without esophagitis: Secondary | ICD-10-CM | POA: Diagnosis not present

## 2022-06-03 DIAGNOSIS — R1110 Vomiting, unspecified: Secondary | ICD-10-CM

## 2022-06-03 DIAGNOSIS — K439 Ventral hernia without obstruction or gangrene: Secondary | ICD-10-CM

## 2022-06-03 NOTE — Patient Instructions (Signed)
-   If you would like to view an informative video regarding the TIF procedure, please visit:    [email or web address]

## 2022-06-03 NOTE — H&P (View-Only) (Signed)
Chief Complaint:    GERD, hiatal hernia   HPI:     Patient is a 71 y.o. male with a history of CAD, DDD, HTN, HLD, Meckel's bleed in 1956  s/p surgical resection, referred to me by Dr. Hilarie Fredrickson and Vicie Mutters, PA-C for evaluation of possible antireflux intervention with concomitant laparoscopic hiatal hernia repair and transoral Incisionless Fundoplication (TIF) with a goal to stop or significantly reduce acid suppression therapy.   Longstanding history of GERD for a few years, and worse over the last year or so, to include worsening nocturnal sxs.  Symptoms responsive to high-dose Protonix, but unable to wean to daily dosing due to breakthrough.  GERD history: -Index symptoms: Regurgitation, heartburn, sour brash, belch.  Rare dysphagia -Exacerbating features: Tomato-based sauce, supine -Medications trialed: Pantoprazole, Pepcid -Current medications: Pantoprazole 40 mg bid -Complications: Hiatal hernia, peptic stricture  GERD evaluation: -Last EGD: 05/2022 -Barium esophagram: 09/2019: Small hiatal hernia, moderate distal esophageal mucosal ring with brief sticking of 13 mm barium tablet -Esophageal Manometry: Completed 05/16/2022.  Awaiting final results -pH/Impedance: None -Bravo: None - CT A/P: 04/17/2022: Diverticulosis, otherwise normal-appearing GI tract - HIDA: 05/2022: Normal  Endoscopic History: - EGD (11/22/2019): Mild nonobstructing ring in GEJ dilated with 18 mm TTS balloon, 3 cm HH, Hill grade 4 valve.  10 mm polyp in D2 - Small bowel enteroscopy (01/27/2020): Nonobstructing ring at GE junction, 3 cm HH.  Moderate antral gastritis (path benign).  Duodenal lymphangiectasia.  18 mm semisessile polyp at D2/D3 removed via piecemeal resection with left snare (path: Duodenal adenoma), then the area superior to the polyp was tattooed with spot - EGD (01/23/2021): Nonobstructing ring, 5 cm HH, duodenal scar in the second/third portion of the duodenum adenoma resection (path:  Benign small bowel mucosa).  Recommended repeat EGD in 2 years for continued adenoma surveillance - EGD (05/13/2022): Low-grade peptic stricture dilated with 18 mm TTS balloon, 4 cm HH, Hill grade 4 valve.  Normal stomach.  Tattoo D2.  Polypoid mucosa in D2 (path: Benign), Benign duodenal lymphangiectasia    He is scheduled for appointment in the Cardiology Clinic on 06/07/22 to discuss Cardiac clearance to proceed with concomitant laparoscopic hiatal hernia repair and TIF.  Scheduled to see Dr. Redmond Pulling on 06/08/2022.  Separately, he was seen in the ER on 04/17/2022 with abdominal bloating, belching, flatus, and nausea.  Evaluation largely unremarkable to include normal CT, CBC, BMP.  Symptoms were attributed to recently starting Ozempic.  Was discharged with trial of Reglan and viscous lidocaine for sore throat. He has since stopped the Ozempic and those sxs resolved.   His additional GI history includes GI bleed age 58 from [place] diverticulum s/p surgical resection, duodenal adenoma s/p endoscopic resection 2021, colon polyps.    Review of systems:     No chest pain, no SOB, no fevers, no urinary sx   Past Medical History:  Diagnosis Date   Arthritis    CAD (coronary artery disease) 07/13/2021   Cor Ca++ Score 837.  Mixed mod plaque prox RCA(50 to 69%).  Mild Ca++ plaque mid RCA (25-49%).  Minimal Ca++ plaque prox LCx (0-24%).  Mid LCx CA++ plaque (50 to 69%), Prox OM1 CA++ plaque (50-69%).  Prox LAD mild to 0-24%. LM  mild 0-24% Ca++  plaque. => CT FFR + for distal LCx (small caliber)  Negative findings in the LAD, OM1 and  and RCA. =>  Would not recommend catheterization.   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 Hilarie Fredrickson:  Low-grade of narrowing Schatzki ring. Dilated to 18 mm with balloon. - 3 cm hiatal hernia. - Normal stomach. - A single  duodenal polyp with appearance most consistent with adenoma. - Duodenal mucosal lymphangiectasias.    Gastroparesis    GERD (gastroesophageal reflux disease)    Hiatal hernia    History of colon polyps    Hyperlipidemia    Hypertension    No sign of renal artery stenosis   IBS (irritable bowel syndrome)    RBBB    Systolic murmur    no  current problems   Vertigo    Vitamin D deficiency     Patient's surgical history, family medical history, social history, medications and allergies were all reviewed in Epic    Current Outpatient Medications  Medication Sig Dispense Refill   amLODipine (NORVASC) 10 MG tablet TAKE ONE TABLET BY MOUTH DAILY FOR BLOOD PRESSURE 90 tablet 3   Ascorbic Acid (VITAMIN C) 1000 MG tablet Take 1,000 mg by mouth in the morning and at bedtime.     aspirin 81 MG tablet Take 81 mg by mouth daily.     BIOTIN PO Take 1 tablet by mouth daily.     carvedilol (COREG) 12.5 MG tablet TAKE ONE TABLET BY MOUTH TWICE A DAY 180 tablet 2   famotidine (PEPCID) 20 MG tablet TAKE 1 TABLET BY MOUTH DAILY AS NEEDED FOR HEARTBURN OR INDIGESTION 30 tablet 1   gabapentin (NEURONTIN) 300 MG capsule Take 1 capsule 3 times a day by oral route as needed for 30 days.     Magnesium 400 MG TABS Take 400 mg by mouth daily.     olmesartan-hydrochlorothiazide (BENICAR HCT) 40-25 MG tablet TAKE ONE TABLET BY MOUTH DAILY FOR BLOOD PRESSURE 90 tablet 3   pantoprazole (PROTONIX) 40 MG tablet Take 1 tablet (40 mg total) by mouth 2 (two) times daily before a meal. Take 30 minutes before breakfast and 30 minutes before dinner every day. 60 tablet 5   rosuvastatin (CRESTOR) 40 MG tablet Take  1 tablet  Daily  for Cholesterol                                                         /                                 TAKE ONE TABLET BY MOUTH 90 tablet 3   Thiamine HCl (VITAMIN B-1) 250 MG tablet Take 250 mg by mouth daily.     vitamin E 200 UNIT capsule Take 200 Units by mouth 2 (two) times daily.      ezetimibe (ZETIA) 10 MG tablet TAKE ONE TABLET BY MOUTH DAILY (Patient not taking: Reported on 06/03/2022) 90 tablet 1   No current facility-administered medications for this visit.    Physical Exam:     BP 130/60   Pulse 60   Ht 6' (1.829 m)   Wt 226 lb 12.8 oz (102.9 kg)   BMI 30.76 kg/m   GENERAL:  Pleasant male in NAD PSYCH: : Cooperative, normal affect EENT:  conjunctiva pink, mucous  membranes moist, neck supple without masses CARDIAC:  RRR, no murmur heard, no peripheral edema PULM: Normal respiratory effort, lungs CTA bilaterally, no wheezing ABDOMEN: Small ventral hernia without TTP.  Otherwise nondistended, soft, nontender. Normal bowel sounds SKIN:  turgor, no lesions seen Musculoskeletal:  Normal muscle tone, normal strength NEURO: Alert and oriented x 3, no focal neurologic deficits   IMPRESSION and PLAN:    1) GERD 2) Hiatal hernia 3) Peptic stricture 4) Heartburn 5) Regurgitation 71 year old man with longstanding history of GERD complicated by hiatal hernia and peptic stricture.  Has clear objective evidence of reflux demonstrated by the peptic stricture requiring serial endoscopic dilations.  We discussed the pathophysiology of reflux at length today, to include treatment options of 1) continued medical management, 2) antireflux surgical options.  Discussed the latter at length, to include concomitant laparoscopic hiatal hernia pair and TIF (cTIF).  Discussed the risks, benefits, alternatives of TIF at length, and he would like to proceed with cTIF.  - Will follow-up on Esophageal Manometry results which was performed last month - Scheduled for appointment in the Cardiology Clinic next week for preoperative clearance assessment - Scheduled for appointment with Dr. Redmond Pulling in [facility] next week to discuss laparoscopic hiatal hernia repair - Will plan for intraoperative balloon dilation of known stricture prior to placement of Esophyx device - Discussed the  postoperative diet and activity/exercise restrictions and will send him handouts for each of these for his review well ahead of any surgical date - Pending results from EM study, cardiology appointment, surgical appointment, discussed tentatively scheduling procedure for 06/17/2022 depending the OR availability - Continue Protonix 40 mg bid - Continue antireflux lifestyle/dietary modifications with avoidance of exacerbating foods - Discussed postoperative admission for 23-hour observation on Hospitalist Service  6) Nausea 7) Bloating - Resolved after discontinuation of Ozempic.  Patient has no plans to resume  8) Ventral hernia Exam with small ventral hernia.  I recommend that he also discussed this with Dr. Redmond Pulling during his appointment next week to discuss whether or not this should be surgically repaired  I spent 45 minutes of time, including in depth chart review, independent review of results as outlined above, communicating results with the patient directly, face-to-face time with the patient, coordinating care, ordering studies and medications as appropriate, and documentation.   Lavena Bullion ,DO, FACG 06/03/2022, 8:26 AM

## 2022-06-03 NOTE — Progress Notes (Signed)
Chief Complaint:    GERD, hiatal hernia   HPI:     Patient is a 71 y.o. male with a history of CAD, DDD, HTN, HLD, Meckel's bleed in 1956  s/p surgical resection, referred to me by Dr. Hilarie Fredrickson and Vicie Mutters, PA-C for evaluation of possible antireflux intervention with concomitant laparoscopic hiatal hernia repair and transoral Incisionless Fundoplication (TIF) with a goal to stop or significantly reduce acid suppression therapy.   Longstanding history of GERD for a few years, and worse over the last year or so, to include worsening nocturnal sxs.  Symptoms responsive to high-dose Protonix, but unable to wean to daily dosing due to breakthrough.  GERD history: -Index symptoms: Regurgitation, heartburn, sour brash, belch.  Rare dysphagia -Exacerbating features: Tomato-based sauce, supine -Medications trialed: Pantoprazole, Pepcid -Current medications: Pantoprazole 40 mg bid -Complications: Hiatal hernia, peptic stricture  GERD evaluation: -Last EGD: 05/2022 -Barium esophagram: 09/2019: Small hiatal hernia, moderate distal esophageal mucosal ring with brief sticking of 13 mm barium tablet -Esophageal Manometry: Completed 05/16/2022.  Awaiting final results -pH/Impedance: None -Bravo: None - CT A/P: 04/17/2022: Diverticulosis, otherwise normal-appearing GI tract - HIDA: 05/2022: Normal  Endoscopic History: - EGD (11/22/2019): Mild nonobstructing ring in GEJ dilated with 18 mm TTS balloon, 3 cm HH, Hill grade 4 valve.  10 mm polyp in D2 - Small bowel enteroscopy (01/27/2020): Nonobstructing ring at GE junction, 3 cm HH.  Moderate antral gastritis (path benign).  Duodenal lymphangiectasia.  18 mm semisessile polyp at D2/D3 removed via piecemeal resection with left snare (path: Duodenal adenoma), then the area superior to the polyp was tattooed with spot - EGD (01/23/2021): Nonobstructing ring, 5 cm HH, duodenal scar in the second/third portion of the duodenum adenoma resection (path:  Benign small bowel mucosa).  Recommended repeat EGD in 2 years for continued adenoma surveillance - EGD (05/13/2022): Low-grade peptic stricture dilated with 18 mm TTS balloon, 4 cm HH, Hill grade 4 valve.  Normal stomach.  Tattoo D2.  Polypoid mucosa in D2 (path: Benign), Benign duodenal lymphangiectasia    He is scheduled for appointment in the Cardiology Clinic on 06/07/22 to discuss Cardiac clearance to proceed with concomitant laparoscopic hiatal hernia repair and TIF.  Scheduled to see Dr. Redmond Pulling on 06/08/2022.  Separately, he was seen in the ER on 04/17/2022 with abdominal bloating, belching, flatus, and nausea.  Evaluation largely unremarkable to include normal CT, CBC, BMP.  Symptoms were attributed to recently starting Ozempic.  Was discharged with trial of Reglan and viscous lidocaine for sore throat. He has since stopped the Ozempic and those sxs resolved.   His additional GI history includes GI bleed age 48 from [place] diverticulum s/p surgical resection, duodenal adenoma s/p endoscopic resection 2021, colon polyps.    Review of systems:     No chest pain, no SOB, no fevers, no urinary sx   Past Medical History:  Diagnosis Date   Arthritis    CAD (coronary artery disease) 07/13/2021   Cor Ca++ Score 837.  Mixed mod plaque prox RCA(50 to 69%).  Mild Ca++ plaque mid RCA (25-49%).  Minimal Ca++ plaque prox LCx (0-24%).  Mid LCx CA++ plaque (50 to 69%), Prox OM1 CA++ plaque (50-69%).  Prox LAD mild to 0-24%. LM  mild 0-24% Ca++  plaque. => CT FFR + for distal LCx (small caliber)  Negative findings in the LAD, OM1 and  and RCA. =>  Would not recommend catheterization.   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 Hilarie Fredrickson:  Low-grade of narrowing Schatzki ring. Dilated to 18 mm with balloon. - 3 cm hiatal hernia. - Normal stomach. - A single  duodenal polyp with appearance most consistent with adenoma. - Duodenal mucosal lymphangiectasias.    Gastroparesis    GERD (gastroesophageal reflux disease)    Hiatal hernia    History of colon polyps    Hyperlipidemia    Hypertension    No sign of renal artery stenosis   IBS (irritable bowel syndrome)    RBBB    Systolic murmur    no  current problems   Vertigo    Vitamin D deficiency     Patient's surgical history, family medical history, social history, medications and allergies were all reviewed in Epic    Current Outpatient Medications  Medication Sig Dispense Refill   amLODipine (NORVASC) 10 MG tablet TAKE ONE TABLET BY MOUTH DAILY FOR BLOOD PRESSURE 90 tablet 3   Ascorbic Acid (VITAMIN C) 1000 MG tablet Take 1,000 mg by mouth in the morning and at bedtime.     aspirin 81 MG tablet Take 81 mg by mouth daily.     BIOTIN PO Take 1 tablet by mouth daily.     carvedilol (COREG) 12.5 MG tablet TAKE ONE TABLET BY MOUTH TWICE A DAY 180 tablet 2   famotidine (PEPCID) 20 MG tablet TAKE 1 TABLET BY MOUTH DAILY AS NEEDED FOR HEARTBURN OR INDIGESTION 30 tablet 1   gabapentin (NEURONTIN) 300 MG capsule Take 1 capsule 3 times a day by oral route as needed for 30 days.     Magnesium 400 MG TABS Take 400 mg by mouth daily.     olmesartan-hydrochlorothiazide (BENICAR HCT) 40-25 MG tablet TAKE ONE TABLET BY MOUTH DAILY FOR BLOOD PRESSURE 90 tablet 3   pantoprazole (PROTONIX) 40 MG tablet Take 1 tablet (40 mg total) by mouth 2 (two) times daily before a meal. Take 30 minutes before breakfast and 30 minutes before dinner every day. 60 tablet 5   rosuvastatin (CRESTOR) 40 MG tablet Take  1 tablet  Daily  for Cholesterol                                                         /                                 TAKE ONE TABLET BY MOUTH 90 tablet 3   Thiamine HCl (VITAMIN B-1) 250 MG tablet Take 250 mg by mouth daily.     vitamin E 200 UNIT capsule Take 200 Units by mouth 2 (two) times daily.      ezetimibe (ZETIA) 10 MG tablet TAKE ONE TABLET BY MOUTH DAILY (Patient not taking: Reported on 06/03/2022) 90 tablet 1   No current facility-administered medications for this visit.    Physical Exam:     BP 130/60   Pulse 60   Ht 6' (1.829 m)   Wt 226 lb 12.8 oz (102.9 kg)   BMI 30.76 kg/m   GENERAL:  Pleasant male in NAD PSYCH: : Cooperative, normal affect EENT:  conjunctiva pink, mucous  membranes moist, neck supple without masses CARDIAC:  RRR, no murmur heard, no peripheral edema PULM: Normal respiratory effort, lungs CTA bilaterally, no wheezing ABDOMEN: Small ventral hernia without TTP.  Otherwise nondistended, soft, nontender. Normal bowel sounds SKIN:  turgor, no lesions seen Musculoskeletal:  Normal muscle tone, normal strength NEURO: Alert and oriented x 3, no focal neurologic deficits   IMPRESSION and PLAN:    1) GERD 2) Hiatal hernia 3) Peptic stricture 4) Heartburn 5) Regurgitation 71 year old man with longstanding history of GERD complicated by hiatal hernia and peptic stricture.  Has clear objective evidence of reflux demonstrated by the peptic stricture requiring serial endoscopic dilations.  We discussed the pathophysiology of reflux at length today, to include treatment options of 1) continued medical management, 2) antireflux surgical options.  Discussed the latter at length, to include concomitant laparoscopic hiatal hernia pair and TIF (cTIF).  Discussed the risks, benefits, alternatives of TIF at length, and he would like to proceed with cTIF.  - Will follow-up on Esophageal Manometry results which was performed last month - Scheduled for appointment in the Cardiology Clinic next week for preoperative clearance assessment - Scheduled for appointment with Dr. Redmond Pulling in [facility] next week to discuss laparoscopic hiatal hernia repair - Will plan for intraoperative balloon dilation of known stricture prior to placement of Esophyx device - Discussed the  postoperative diet and activity/exercise restrictions and will send him handouts for each of these for his review well ahead of any surgical date - Pending results from EM study, cardiology appointment, surgical appointment, discussed tentatively scheduling procedure for 06/17/2022 depending the OR availability - Continue Protonix 40 mg bid - Continue antireflux lifestyle/dietary modifications with avoidance of exacerbating foods - Discussed postoperative admission for 23-hour observation on Hospitalist Service  6) Nausea 7) Bloating - Resolved after discontinuation of Ozempic.  Patient has no plans to resume  8) Ventral hernia Exam with small ventral hernia.  I recommend that he also discussed this with Dr. Redmond Pulling during his appointment next week to discuss whether or not this should be surgically repaired  I spent 45 minutes of time, including in depth chart review, independent review of results as outlined above, communicating results with the patient directly, face-to-face time with the patient, coordinating care, ordering studies and medications as appropriate, and documentation.   Lavena Bullion ,DO, FACG 06/03/2022, 8:26 AM

## 2022-06-06 NOTE — Telephone Encounter (Signed)
Pt states that Dr. Redmond Pulling is out of network with his insurance company. He wanted to know if there was a different doctor that Dr. Loletha Grayer worked with, let him know that is the only Psychologist, sport and exercise group in [place]. He will call his insurance company to make sure he will be ok.

## 2022-06-06 NOTE — Telephone Encounter (Signed)
Patient called with concerns regarding his insurance and Dr. Redmond Pulling said he is non par. Please call to discuss further.

## 2022-06-07 ENCOUNTER — Other Ambulatory Visit: Payer: Self-pay | Admitting: Internal Medicine

## 2022-06-07 ENCOUNTER — Encounter: Payer: Self-pay | Admitting: Cardiology

## 2022-06-07 ENCOUNTER — Ambulatory Visit: Payer: BC Managed Care – PPO | Attending: Cardiovascular Disease | Admitting: Physician Assistant

## 2022-06-07 DIAGNOSIS — Z01810 Encounter for preprocedural cardiovascular examination: Secondary | ICD-10-CM

## 2022-06-07 DIAGNOSIS — I700 Atherosclerosis of aorta: Secondary | ICD-10-CM

## 2022-06-07 DIAGNOSIS — E1169 Type 2 diabetes mellitus with other specified complication: Secondary | ICD-10-CM

## 2022-06-07 NOTE — Progress Notes (Signed)
Virtual Visit via Telephone Note   Because of Nathaniel Gill's co-morbid illnesses, he is at least at moderate risk for complications without adequate follow up.  This format is felt to be most appropriate for this patient at this time.  The patient did not have access to video technology/had technical difficulties with video requiring transitioning to audio format only (telephone).  All issues noted in this document were discussed and addressed.  No physical exam could be performed with this format.  Please refer to the patient's chart for his consent to telehealth for [facility]  Evaluation Performed:  Preoperative cardiovascular risk assessment _____________   Date:  06/07/2022   Patient ID:  Nathaniel, Gill 25-Oct-1950, MRN [id number] Patient Location:  Home Provider location:   Office  Primary Care Provider:  Unk Pinto, MD Primary Cardiologist:  [name], MD  Chief Complaint / Patient Profile   71 y.o. 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  History of Present Illness    GJaron Czarneckiis a 71y.o. male who presents via audio/video conferencing for a telehealth visit today.  Pt was last seen in cardiology clinic on 08/18/21 by Dr. HEllyn Hack  At that time GDenard Tuminellowas doing well.  Prior echo 07/2017 showed EF 55-60%, G1DD. Cor CT 07/2021 showed mild-moderate CAD and dilation of aorta to 489m CT FFR suggested functionally significant lesion in distal LCX, though small vessel. Otherwise CT FFR suggests nonobstructive CAD. In the absence of symptoms, the patient was managed medically. He called in with  near syncopal spell in 11/2021 which Dr. HaEllyn Hackuspected was vasovagal; carotid duplex showed mild carotid disease. He also had some symptoms in 04/2022 felt GI in nature per MD. The patient is now pending procedure as outlined above. Since his last visit, he reports he has been doing well and has been noticing BP has been better controlled - last assessment 118/72. He remains very physically active walking 7-10,000 steps a day and exercises regularly without any angina or dyspnea. He feels from cardiac standpoint he is doing great.  Past Medical History    Past Medical History:  Diagnosis Date   Arthritis    CAD (coronary artery disease) 07/13/2021   Cor Ca++ Score 837.  Mixed mod plaque prox RCA(50 to 69%).  Mild Ca++ plaque mid RCA (25-49%).  Minimal Ca++ plaque prox LCx (0-24%).  Mid LCx CA++ plaque (50 to 69%), Prox OM1 CA++ plaque (50-69%).  Prox LAD mild to 0-24%. LM  mild 0-24% Ca++  plaque. => CT FFR + for distal LCx (small caliber)  Negative findings in the LAD, OM1 and  and RCA. =>  Would not recommend catheterization.   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 PyHilarie Fredrickson Low-grade of narrowing Schatzki ring. Dilated to 18 mm with balloon. - 3 cm hiatal hernia. - Normal stomach. 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    Gastroparesis    GERD (gastroesophageal reflux disease)  Hiatal hernia    History of colon polyps    Hyperlipidemia    Hypertension    No sign of renal artery stenosis   IBS (irritable bowel syndrome)    RBBB    Systolic murmur    no  current problems   Vertigo    Vitamin D deficiency    Past Surgical History:  Procedure Laterality Date   ABDOMINAL SURGERY  1956   Related to bleeding episode.    BIOPSY  01/27/2020   Procedure: BIOPSY;   Surgeon: Rush Landmark Telford Nab., MD;  Location: [place];  Service: Gastroenterology;;   BIOPSY  01/21/2021   Procedure: BIOPSY;  Surgeon: Irving Copas., MD;  Location: [place];  Service: Gastroenterology;;   CARDIAC CATHETERIZATION  2005   no intervention per patient   CARPAL TUNNEL RELEASE Left 03/06/2020   Dr. Amedeo Plenty   COCHLEAR IMPLANT Bilateral    COLONOSCOPY     several - Last one 11/22/19   CYST REMOVAL HAND Left 1975   [place]   ENDOSCOPIC MUCOSAL RESECTION N/A 01/27/2020   Procedure: ENDOSCOPIC MUCOSAL RESECTION;  Surgeon: Irving Copas., MD;  Location: [place];  Service: Gastroenterology;  Laterality: N/A;   ENTEROSCOPY N/A 01/27/2020   Procedure: ENTEROSCOPY;  Surgeon: Rush Landmark Telford Nab., MD;  Location: [place];  Service: Gastroenterology;  Laterality: N/A;   ESOPHAGEAL MANOMETRY N/A 05/18/2022   Procedure: ESOPHAGEAL MANOMETRY (EM);  Surgeon: Jerene Bears, MD;  Location: [facility];  Service: Gastroenterology;  Laterality: N/A;   ESOPHAGOGASTRODUODENOSCOPY (EGD) WITH PROPOFOL N/A 01/21/2021   Procedure: ESOPHAGOGASTRODUODENOSCOPY (EGD) WITH PROPOFOL;  Surgeon: Rush Landmark Telford Nab., MD;  Location: [place];  Service: Gastroenterology;  Laterality: N/A;   HEMOSTASIS CLIP PLACEMENT  01/27/2020   Procedure: HEMOSTASIS CLIP PLACEMENT;  Surgeon: Rush Landmark Telford Nab., MD;  Location: [place];  Service: Gastroenterology;;   SKIN LESION EXCISION     STAPEDECTOMY Bilateral    x 2 - 10 yrs apart - No MRIs per patient.   SUBMUCOSAL LIFTING INJECTION  01/27/2020   Procedure: SUBMUCOSAL LIFTING INJECTION;  Surgeon: Rush Landmark Telford Nab., MD;  Location: [place];  Service: Gastroenterology;;   SUBMUCOSAL TATTOO INJECTION  01/27/2020   Procedure: SUBMUCOSAL TATTOO INJECTION;  Surgeon: Irving Copas., MD;  Location: [place];  Service: Gastroenterology;;   TRANSTHORACIC ECHOCARDIOGRAM  07/2017   Normal LV size and  function.  EF 55-60%.  No RWMA.  Basal septal hypertrophy, but no sign of significant hypertensive heart disease.  Only GR 1 DD.   TRIGGER FINGER RELEASE Right 2014   Thumb   UPPER GI ENDOSCOPY     several - last one 11/22/2019   WISDOM TOOTH EXTRACTION      Allergies  No Known Allergies  Home Medications    Prior to Admission medications   Medication Sig Start Date End Date Taking? Authorizing Provider  amLODipine (NORVASC) 10 MG tablet TAKE ONE TABLET BY MOUTH DAILY FOR BLOOD PRESSURE 09/17/21   Liane Comber, NP  Ascorbic Acid (VITAMIN C) 1000 MG tablet Take 1,000 mg by mouth in the morning and at bedtime.    [provider]  aspirin 81 MG tablet Take 81 mg by mouth daily.    [provider]  BIOTIN PO Take 1 tablet by mouth daily.    [provider]  carvedilol (COREG) 12.5 MG tablet TAKE ONE TABLET BY MOUTH TWICE A DAY 04/25/22   Leonie Man, MD  ezetimibe (ZETIA) 10 MG tablet TAKE ONE TABLET BY MOUTH DAILY  Patient not taking: Reported on 06/03/2022 12/09/21   Alycia Rossetti, NP  famotidine (PEPCID) 20 MG tablet TAKE 1 TABLET BY MOUTH DAILY AS NEEDED FOR HEARTBURN OR INDIGESTION 04/18/22   Levin Erp, PA  gabapentin (NEURONTIN) 300 MG capsule Take 1 capsule 3 times a day by oral route as needed for 30 days. 05/03/22   [provider]  Magnesium 400 MG TABS Take 400 mg by mouth daily.    [provider]  olmesartan-hydrochlorothiazide (BENICAR HCT) 40-25 MG tablet TAKE ONE TABLET BY MOUTH DAILY FOR BLOOD PRESSURE 09/17/21   Liane Comber, NP  pantoprazole (PROTONIX) 40 MG tablet Take 1 tablet (40 mg total) by mouth 2 (two) times daily before a meal. Take 30 minutes before breakfast and 30 minutes before dinner every day. 02/15/22   Levin Erp, PA  rosuvastatin (CRESTOR) 40 MG tablet Take  1 tablet  Daily  for Cholesterol                                                         /                                  TAKE ONE TABLET BY MOUTH 06/13/21   Unk Pinto, MD  Thiamine HCl (VITAMIN B-1) 250 MG tablet Take 250 mg by mouth daily.    [provider]  vitamin E 200 UNIT capsule Take 200 Units by mouth 2 (two) times daily.    [provider]    Physical Exam    Vital Signs:  Jeovanni Heuring reports recent BP 118/72.  Given telephonic nature of communication, physical exam is limited. AAOx3. NAD. Normal affect.  Speech and respirations are unlabored.  Accessory Clinical Findings    None  Assessment & Plan    1.  Preoperative Cardiovascular Risk Assessment: RCRI 0.9% indicating low CV risk. The patient affirms he has been doing well without any new cardiac symptoms. They are able to achieve over 4 METS without cardiac limitations. Therefore, based on ACC/AHA guidelines, the patient would be at acceptable risk for the planned procedure without further cardiovascular testing. I did reach out to Dr. Ellyn Hack to review his case given the abnormal FFR and Dr. Ellyn Hack agreed that as long as no new symptoms, he would be fine to proceed without delay. The patient was advised that if he develops new symptoms prior to surgery to contact our office to arrange for a follow-up visit, and he verbalized understanding.  We are not asked to hold any medicines but in the event the surgeon feels they need to hold aspirin, Dr. Ellyn Hack has given permission to hold if needed. Otherwise would continue perioperatively if safe from a surgical standpoint.  A copy of this note will be routed to requesting surgeon.  Time:   Today, I have spent 5 minutes with the patient with telehealth technology discussing medical history, symptoms, and management plan.     Charlie Pitter, PA-C  06/07/2022, 9:25 AM

## 2022-06-07 NOTE — Telephone Encounter (Signed)
Was on phone with pt when msg received. Closing out.

## 2022-06-08 ENCOUNTER — Encounter: Payer: Self-pay | Admitting: Gastroenterology

## 2022-06-08 DIAGNOSIS — K219 Gastro-esophageal reflux disease without esophagitis: Secondary | ICD-10-CM | POA: Diagnosis not present

## 2022-06-08 DIAGNOSIS — Z9889 Other specified postprocedural states: Secondary | ICD-10-CM | POA: Diagnosis not present

## 2022-06-08 DIAGNOSIS — K449 Diaphragmatic hernia without obstruction or gangrene: Secondary | ICD-10-CM | POA: Diagnosis not present

## 2022-06-08 DIAGNOSIS — M6200 Separation of muscle (nontraumatic), unspecified site: Secondary | ICD-10-CM | POA: Diagnosis not present

## 2022-06-09 ENCOUNTER — Encounter: Payer: Self-pay | Admitting: Nurse Practitioner

## 2022-06-09 ENCOUNTER — Ambulatory Visit (INDEPENDENT_AMBULATORY_CARE_PROVIDER_SITE_OTHER): Payer: Medicare Other | Admitting: Nurse Practitioner

## 2022-06-09 VITALS — BP 128/70 | HR 61 | Temp 97.3°F | Ht 73.0 in | Wt 227.8 lb

## 2022-06-09 DIAGNOSIS — L989 Disorder of the skin and subcutaneous tissue, unspecified: Secondary | ICD-10-CM

## 2022-06-09 DIAGNOSIS — K449 Diaphragmatic hernia without obstruction or gangrene: Secondary | ICD-10-CM

## 2022-06-09 DIAGNOSIS — M5136 Other intervertebral disc degeneration, lumbar region: Secondary | ICD-10-CM

## 2022-06-09 DIAGNOSIS — E1169 Type 2 diabetes mellitus with other specified complication: Secondary | ICD-10-CM | POA: Diagnosis not present

## 2022-06-09 DIAGNOSIS — E559 Vitamin D deficiency, unspecified: Secondary | ICD-10-CM | POA: Diagnosis not present

## 2022-06-09 DIAGNOSIS — E669 Obesity, unspecified: Secondary | ICD-10-CM

## 2022-06-09 DIAGNOSIS — E1122 Type 2 diabetes mellitus with diabetic chronic kidney disease: Secondary | ICD-10-CM

## 2022-06-09 DIAGNOSIS — R202 Paresthesia of skin: Secondary | ICD-10-CM

## 2022-06-09 DIAGNOSIS — Z860101 Personal history of adenomatous and serrated colon polyps: Secondary | ICD-10-CM

## 2022-06-09 DIAGNOSIS — Z79899 Other long term (current) drug therapy: Secondary | ICD-10-CM

## 2022-06-09 DIAGNOSIS — R931 Abnormal findings on diagnostic imaging of heart and coronary circulation: Secondary | ICD-10-CM

## 2022-06-09 DIAGNOSIS — R011 Cardiac murmur, unspecified: Secondary | ICD-10-CM

## 2022-06-09 DIAGNOSIS — Z86010 Personal history of colonic polyps: Secondary | ICD-10-CM

## 2022-06-09 DIAGNOSIS — K2100 Gastro-esophageal reflux disease with esophagitis, without bleeding: Secondary | ICD-10-CM

## 2022-06-09 DIAGNOSIS — Z0000 Encounter for general adult medical examination without abnormal findings: Secondary | ICD-10-CM

## 2022-06-09 DIAGNOSIS — Z125 Encounter for screening for malignant neoplasm of prostate: Secondary | ICD-10-CM

## 2022-06-09 DIAGNOSIS — Z8719 Personal history of other diseases of the digestive system: Secondary | ICD-10-CM

## 2022-06-09 DIAGNOSIS — I1A0 Resistant hypertension: Secondary | ICD-10-CM

## 2022-06-09 DIAGNOSIS — E66811 Obesity, class 1: Secondary | ICD-10-CM

## 2022-06-09 DIAGNOSIS — M51369 Other intervertebral disc degeneration, lumbar region without mention of lumbar back pain or lower extremity pain: Secondary | ICD-10-CM

## 2022-06-09 DIAGNOSIS — Z0001 Encounter for general adult medical examination with abnormal findings: Secondary | ICD-10-CM

## 2022-06-09 DIAGNOSIS — I4510 Unspecified right bundle-branch block: Secondary | ICD-10-CM

## 2022-06-09 DIAGNOSIS — I700 Atherosclerosis of aorta: Secondary | ICD-10-CM

## 2022-06-09 DIAGNOSIS — N182 Chronic kidney disease, stage 2 (mild): Secondary | ICD-10-CM

## 2022-06-09 DIAGNOSIS — E785 Hyperlipidemia, unspecified: Secondary | ICD-10-CM | POA: Diagnosis not present

## 2022-06-09 DIAGNOSIS — K317 Polyp of stomach and duodenum: Secondary | ICD-10-CM

## 2022-06-09 MED ORDER — GABAPENTIN 300 MG PO CAPS: ORAL_CAPSULE | ORAL | 1 refills | Status: DC

## 2022-06-09 NOTE — Progress Notes (Signed)
CPE  Assessment and Plan:  Diagnoses and all orders for this visit:  Encounter for Annual Physical Exam with abnormal findings Due annually  Health Maintenance reviewed Healthy lifestyle reviewed and goals set  Atherosclerosis of aorta ([place]) Per imaging, CXR 07/2018 Control blood pressure, cholesterol, glucose, increase exercise.   Coronary calcium score over 400 He is established with cardiology LDL goal at minimum <70, ideally <55  On rosuvastatin 40 mg; discussed repatha vs addition of zetia for goal; he would like to proceed with zetia - script sent, recheck in 3 months.  Take daily ASA 81 mg Reminder to go to the ER if any CP, SOB, nausea, dizziness, severe HA, changes vision/speech, left arm numbness and tingling and jaw pain.  Resistant hypertension Controlled on current medications Monitor blood pressure at home; call if consistently over 130/80 Continue DASH diet.   Reminder to go to the ER if any CP, SOB, nausea, dizziness, severe HA, changes vision/speech, left arm numbness and tingling and jaw pain.  RBBB (right bundle branch block) Monitor, followed by cardiology  Systolic murmur Normal ECHO Followed by cardiology  T2DM [facility])  Education: Reviewed 'ABCs' of diabetes management (respective goals in parentheses):  A1C (<7), blood pressure (<130/80), and cholesterol (LDL <70) Eye Exam yearly and Dental Exam every 6 months.- diabetes eye exam report requested once complete Dietary recommendations Physical Activity recommendations Aggressive control due to high CCC - discussed and sent in ozempic starting dose to try. If tolerating 0.5 mg well contact office for 1 mg/week script - A1C  Hyperlipidemia associated with T2DM ([place]) Continue medications - has been well controlled in past with rosuvastatin  LDL goal <70; reviewed low saturated fat, increase soluble fiber  Continue weight management and exercise.  - Lipid panel  Erectile dysfunction associated with T2DM  (HCC)  Control blood pressure, cholesterol, glucose, increase exercise.  Declines med at this time  CKD II associated with T2DM (HCC) Increase fluids, avoid NSAIDS, monitor sugars, will monitor - CMP/GFR - Routine UA with reflex microscopic - Microablumin/creatinine urine ratio  Obesity- BMI 30  Long discussion about weight loss, diet, and exercise Recommended diet heavy in fruits and veggies and low in animal meats, cheeses, and dairy products, appropriate calorie intake Discussed appropriate weight for height Off of phentermine - limited benefit  Follow up at next visit  Medication management CBC, CMP/GFR, magnesium  Scalp lesion right side Refer to dermatology for evaluation  Paresthesia of right foot Restart Gabapentin and monitor symptoms  Gastroesophageal reflux disease Well managed on current medications Discussed diet, avoiding triggers and other lifestyle changes  Hiatal hernia Small regular meals, PPI indefinitely as recommended by GI Upcoming repair and TIF  S/p dilation esophageal stricture S/p dilation, continue PPI Denies recurrent sx; he prefers to defer repeat unless symptomatic Upcoming TIF  Duodenal polyp S/p resection; GI recommended 1 year follow up due 01/2022  Colon polyps Recommended 3 year recall, due 2024  Vitamin D deficiency Continue supplementation Defer vitamin D level  Arthritis/degenerative disk Continue follow up with ortho, PRN gabapentin Discussed cardiac risks with oral nsaids - tylenol, topicals encouraged   Screening for Prostate Cancer -PSA   Discussed med's effects and SE's. Screening labs and tests as requested with regular follow-up as recommended. Over 45 minutes of exam, counseling, chart review, coordination of care and critical decision making was performed for establishment of new patient with complete physical.   Future Appointments  Date Time Provider Branch  06/14/2022  3:00 PM [facility] PAT 2  [facility] None  07/26/2022  9:30 AM Pyrtle, Lajuan Lines, MD [facility]  01/03/2023  9:00 AM Darrol Jump, NP [facility] None  06/12/2023 10:00 AM Alycia Rossetti, NP [facility] None    Plan:   During the course of the visit the patient was educated and counseled about appropriate screening and preventive services including:   Pneumococcal vaccine  Prevnar 13 Influenza vaccine Td vaccine Screening electrocardiogram Bone densitometry screening Colorectal cancer screening Diabetes screening Glaucoma screening Nutrition counseling  Advanced directives: requested  HPI BP 128/70   Pulse 61   Temp (!) 97.3 F (36.3 C)   Ht _0  (1.854 m)   Wt 227 lb 12.8 oz (103.3 kg)   SpO2 96%   BMI 30.05 kg/m   The patient is a very pleasant 71 y.o., presents for CPE. He has Resistant hypertension; Hyperlipidemia associated with type 2 diabetes mellitus ([place]); Degenerative lumbar disc; RBBB (right bundle branch block); Type 2 diabetes mellitus ([place]); Acid reflux; Vitamin D deficiency; Medication management; Erectile dysfunction associated with type 2 diabetes mellitus ([place]); Overweight (BMI 25.0-29.9); Atherosclerosis of aorta ([place]); CKD stage 2 due to type 2 diabetes mellitus ([place]); Hiatal hernia; History of adenomatous polyp of colon; Polyp of duodenum; S/P dilatation of esophageal stricture; Agatston coronary artery calcium score greater than 400 (1036 on 06/04/2021); Metabolic syndrome; Esophageal dysphagia; and Hiatal hernia with GERD on their problem list.  He has an odd sensation going down the left side of his neck. No rashes. Has only been occurring x 3 days  He has noted some tingling of bottom of right foot that comes and goes, ice seems to help.   He is married. First grandson born November 2022.  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 Planning to retire  09/2022  He follows with [facility] Pupa for multiple ortho complaints. Has had lumbar pain, degenerative disk with intermittent R radicular sx, has had injection with good results, also benefits from PRN gabapentin 300 mg, advil/aleve rarely.    He has noticed a dry scaling area on back of neck and area on right scalp that wont heal that has been present for several years.  He has a diagnosis of GERD, He had colonoscopy and EGD by Dr. Hilarie Fredrickson on 11/22/2019 which showed 3 cm hital hernia, low-grade of narrowing schatzki ring (dilated), adenomaous duodenal polyp underwent resection on 01/27/2020 by Dr. Rush Landmark, had another EGD 01/2021, recommended 1 year recall due to adnomatous polyp. Colonoscopy showed several adenomatous polyps recommended for 3 year follow up. Continue omeprazole 40 mg daily, and reports has elevated HOB 2 inches with fully resolved GERD. He is having hiatal hernia repair 06/17/22. Dr. Bryan Lemma is doing a TIF- will be on liquid diet after surgery.   BMI is Body mass index is 30.05 kg/m., he has been working on diet and exercise, walking 5-6 miles daily, does weights. Stopped phentermine due to lack of sufficient perceived benefit.  Doing high protein diet,  He reports sleeps well, getting 7+ hours daily, wakes up feeling rested.  Wt Readings from Last 3 Encounters:  06/09/22 227 lb 12.8 oz (103.3 kg)  06/03/22 226 lb 12.8 oz (102.9 kg)  05/13/22 226 lb (102.5 kg)   He has atherosclerosis of aorta noted on imaging (CXR 07/2018). He recently had Ct coronary calcium showing 3 vessel CAD with total coronary calcium score of 1036.  His blood pressure has been controlled at home, followed by Dr. Ellyn Hack annually, today their BP is BP: 128/70  BP Readings from Last 3 Encounters:  06/09/22 128/70  06/03/22 130/60  05/13/22 126/81  He had essentially normal ECHO in 07/2017 obtained for murmur evaluation; attributed to mild septal thickening.   He does workout. He  denies chest pain, shortness of breath, dizziness.   He is on cholesterol medication (rosuvastatin 40 mg daily) and denies myalgias. His cholesterol is not at goal of LDL <70. The cholesterol last visit was:   Lab Results  Component Value Date   CHOL 125 11/30/2021   HDL 39 (L) 11/30/2021   LDLCALC 65 11/30/2021   TRIG 131 11/30/2021   CHOLHDL 3.2 11/30/2021    He has been working on diet and exercise for T2DM managed by lifestyle (6.5% 04/2018, 6.6% 02/2019), he is on ASA, ACEi, statin and denies foot ulcerations, increased appetite, nausea,  polydipsia, polyuria and visual disturbances. He does have erectile dysfunction, but not recently active, has declined sildenafil. Doesn't have glucometer, has always been well controlled.  Last A1C in the office was:  Lab Results  Component Value Date   HGBA1C 5.9 (H) 12/28/2021   Drinks at least 64 ounces of water a day.  He has stable CKD II associated with T2DM monitored via this office:  Lab Results  Component Value Date   EGFR 90 12/28/2021    Patient is on Vitamin D supplement, ? 4000 IU daily Lab Results  Component Value Date   VD25OH 78 12/28/2021     He denies LUTS or nocturia.  Lab Results  Component Value Date   PSA 1.15 06/16/2021   PSA 1.19 06/16/2020   PSA 1.6 05/20/2019    No results found for: "IRON", "TIBC", "FERRITIN" Lab Results  Component Value Date   VITAMINB12 1,128 (H) 12/28/2021   - he reports started on oral supplement with perceived memory improvement.    Current Medications:  Current Outpatient Medications on File Prior to Visit  Medication Sig Dispense Refill   amLODipine (NORVASC) 10 MG tablet TAKE ONE TABLET BY MOUTH DAILY FOR BLOOD PRESSURE 90 tablet 3   Ascorbic Acid (VITAMIN C) 1000 MG tablet Take 1,000 mg by mouth in the morning and at bedtime.     aspirin 81 MG tablet Take 81 mg by mouth daily.     BIOTIN PO Take 1 tablet by mouth daily.     carvedilol (COREG) 12.5 MG tablet TAKE ONE TABLET BY  MOUTH TWICE A DAY 180 tablet 2   ezetimibe (ZETIA) 10 MG tablet TAKE ONE TABLET BY MOUTH DAILY 90 tablet 1   famotidine (PEPCID) 20 MG tablet TAKE 1 TABLET BY MOUTH DAILY AS NEEDED FOR HEARTBURN OR INDIGESTION 30 tablet 1   Magnesium 400 MG TABS Take 400 mg by mouth daily.     olmesartan-hydrochlorothiazide (BENICAR HCT) 40-25 MG tablet TAKE ONE TABLET BY MOUTH DAILY FOR BLOOD PRESSURE 90 tablet 3   pantoprazole (PROTONIX) 40 MG tablet Take 1 tablet (40 mg total) by mouth 2 (two) times daily before a meal. Take 30 minutes before breakfast and 30 minutes before dinner every day. 60 tablet 5   rosuvastatin (CRESTOR) 40 MG tablet TAKE ONE TABLET BY MOUTH DAILY FOR CHOLESTEROL 90 tablet 3   Thiamine HCl (VITAMIN B-1) 250 MG tablet Take 250 mg by mouth daily.     vitamin E 200 UNIT capsule Take 200 Units by mouth 2 (two) times daily.  No current facility-administered medications on file prior to visit.   Allergies:  Not on File Health Maintenance:  Immunization History  Administered Date(s) Administered   Influenza Split 08/28/2007   Pneumococcal Conjugate-13 04/16/2018   Pneumococcal Polysaccharide-23 05/20/2019   Tdap 11/19/2010    Tetanus: 2014 with laceration Pneumovax: 2020 Prevnar 13: 2019 Flu vaccine: declines Shingrix: declines Covid 19: declines  ECHO: 07/2017 normal   Colonoscopy: 01/2020 - Dr. Hilarie Fredrickson, 3 year recall  EGD: 01/2021 - adenomatous duodenal polyp, 1 year recall, Dr. Allison Quarry  Eye Exam: Dr. [facility], 07/28/2020 no retinopathy  Dentist: Dr. Arnell Sieving, [place], last visit 2022, goes 33m Patient Care Team: MUnk Pinto MD as PCP - General (Internal Medicine) HLeonie Man MD as PCP - Cardiology (Cardiology) Mansouraty, GTelford Nab, MD as Consulting Physician (Gastroenterology)  Medical History:  has Resistant hypertension; Hyperlipidemia associated with type 2 diabetes mellitus ([place]; Degenerative lumbar disc; RBBB (right bundle branch block);  Type 2 diabetes mellitus ([place]; Acid reflux; Vitamin D deficiency; Medication management; Erectile dysfunction associated with type 2 diabetes mellitus ([place]; Overweight (BMI 25.0-29.9); Atherosclerosis of aorta ([place]; CKD stage 2 due to type 2 diabetes mellitus ([place]; Hiatal hernia; History of adenomatous polyp of colon; Polyp of duodenum; S/P dilatation of esophageal stricture; Agatston coronary artery calcium score greater than 400 (1036 on 06/04/2021); Metabolic syndrome; Esophageal dysphagia; and Hiatal hernia with GERD on their problem list. Surgical History:  He  has a past surgical history that includes Cardiac catheterization (2005); Abdominal surgery (1956); Stapedectomy (Bilateral); Trigger finger release (Right, 2014); Cyst removal hand (Left, 1975); Skin lesion excision; transthoracic echocardiogram (07/2017); Colonoscopy; Upper gi endoscopy; Wisdom tooth extraction; Endoscopic mucosal resection (N/A, 01/27/2020); biopsy (01/27/2020); Submucosal lifting injection (01/27/2020); Submucosal tattoo injection (01/27/2020); Hemostasis clip placement (01/27/2020); enteroscopy (N/A, 01/27/2020); Carpal tunnel release (Left, 03/06/2020); Cochlear implant (Bilateral); Esophagogastroduodenoscopy (egd) with propofol (N/A, 01/21/2021); biopsy (01/21/2021); and Esophageal manometry (N/A, 05/18/2022). Family History:  His family history includes Arrhythmia in his brother; COPD in his mother; Heart attack in his brother; Heart disease in his mother; Heart failure in his mother; Hyperlipidemia in his brother and brother; Hypertension in his brother and brother; Kidney disease in his father. Social History:   reports that he has never smoked. He has never used smokeless tobacco. He reports current alcohol use. He reports that he does not use drugs.     Review of Systems:  Review of Systems  Constitutional:  Negative for malaise/fatigue and weight loss.  HENT:  Negative for hearing loss and tinnitus.   Eyes:   Negative for blurred vision and double vision.  Respiratory:  Negative for cough, sputum production, shortness of breath and wheezing.   Cardiovascular:  Negative for chest pain, palpitations, orthopnea, claudication, leg swelling and PND.  Gastrointestinal:  Positive for heartburn. Negative for abdominal pain, blood in stool, constipation, diarrhea, melena, nausea and vomiting.       Difficulty swallowing   Genitourinary: Negative.   Musculoskeletal:  Negative for falls, joint pain and myalgias.  Skin:  Negative for rash.       Skin lesion right scalp that will not resolve  Neurological:  Positive for tingling (right foot and neck). Negative for dizziness, sensory change, weakness and headaches.  Endo/Heme/Allergies:  Negative for polydipsia.  Psychiatric/Behavioral: Negative.  Negative for depression, memory loss, substance abuse and suicidal ideas. The patient is not nervous/anxious and does not have insomnia.   All other systems reviewed and are negative.   Physical Exam: Estimated body  mass index is 30.05 kg/m as calculated from the following:   Height as of this encounter: _0  (1.854 m).   Weight as of this encounter: 227 lb 12.8 oz (103.3 kg). BP 128/70   Pulse 61   Temp (!) 97.3 F (36.3 C)   Ht _1  (1.854 m)   Wt 227 lb 12.8 oz (103.3 kg)   SpO2 96%   BMI 30.05 kg/m    General Appearance: Well nourished, in no apparent distress.  Eyes: PERRLA, EOMs, conjunctiva no swelling or erythema, fundal exam deferred to ophth Sinuses: No Frontal/maxillary tenderness  ENT/Mouth: Ext aud canals clear, normal light reflex with TMs without erythema, bulging. Good dentition. No erythema, swelling, or exudate on post pharynx. Tonsils not swollen or erythematous. Hearing normal.  Neck: Supple, thyroid normal. No bruits  Respiratory: Respiratory effort normal, BS equal bilaterally without rales, rhonchi, wheezing or stridor.  Cardio: RR, bradycardic, without murmurs, rubs or gallops.  Brisk peripheral pulses without edema.  Chest: symmetric, with normal excursions and percussion.  Abdomen: Soft, nontender, no guarding, rebound, hernias, masses, or organomegaly.  Lymphatics: Non tender without lymphadenopathy.  Genitourinary: Declines, no concerns Musculoskeletal: Full ROM all peripheral extremities,5/5 strength, and normal gait. Bony growth/deformity noted to superior aspect of bil MCP joint of 1st digit, ROM intact, without signs of acute inflammation.  Skin: Warm, dry without rashes, ecchymosis.1 cm red lesion of right scalp Neuro: Cranial nerves intact, reflexes equal bilaterally. Normal muscle tone, no cerebellar symptoms. Sensation intact.  Psych: Awake and oriented X 3, normal affect, Insight and Judgment appropriate.   EKG: had recently 04/2022 by Dr. Ellyn Hack - defer   Alycia Rossetti, NP 10:49 AM [facility]

## 2022-06-09 NOTE — Patient Instructions (Signed)

## 2022-06-10 ENCOUNTER — Other Ambulatory Visit: Payer: Self-pay

## 2022-06-10 ENCOUNTER — Encounter: Payer: Self-pay | Admitting: Internal Medicine

## 2022-06-10 ENCOUNTER — Ambulatory Visit: Payer: Self-pay | Admitting: General Surgery

## 2022-06-10 DIAGNOSIS — R1110 Vomiting, unspecified: Secondary | ICD-10-CM

## 2022-06-10 DIAGNOSIS — E8881 Metabolic syndrome: Secondary | ICD-10-CM

## 2022-06-10 DIAGNOSIS — R12 Heartburn: Secondary | ICD-10-CM

## 2022-06-10 DIAGNOSIS — K449 Diaphragmatic hernia without obstruction or gangrene: Secondary | ICD-10-CM

## 2022-06-10 DIAGNOSIS — K219 Gastro-esophageal reflux disease without esophagitis: Secondary | ICD-10-CM

## 2022-06-10 LAB — CBC WITH DIFFERENTIAL/PLATELET
Absolute Monocytes: 773 cells/uL (ref 200–950)
Basophils Absolute: 78 cells/uL (ref 0–200)
Basophils Relative: 1.4 %
Eosinophils Absolute: 280 cells/uL (ref 15–500)
Eosinophils Relative: 5 %
HCT: 41.7 % (ref 38.5–50.0)
Hemoglobin: 13.8 g/dL (ref 13.2–17.1)
Lymphs Abs: 1322 cells/uL (ref 850–3900)
MCH: 29.6 pg (ref 27.0–33.0)
MCHC: 33.1 g/dL (ref 32.0–36.0)
MCV: 89.3 fL (ref 80.0–100.0)
MPV: 9.5 fL (ref 7.5–12.5)
Monocytes Relative: 13.8 %
Neutro Abs: 3147 cells/uL (ref 1500–7800)
Neutrophils Relative %: 56.2 %
Platelets: 309 10*3/uL (ref 140–400)
RBC: 4.67 10*6/uL (ref 4.20–5.80)
RDW: 12.7 % (ref 11.0–15.0)
Total Lymphocyte: 23.6 %
WBC: 5.6 10*3/uL (ref 3.8–10.8)

## 2022-06-10 LAB — URINALYSIS, ROUTINE W REFLEX MICROSCOPIC
Bilirubin Urine: NEGATIVE
Glucose, UA: NEGATIVE
Hgb urine dipstick: NEGATIVE
Ketones, ur: NEGATIVE
Leukocytes,Ua: NEGATIVE
Nitrite: NEGATIVE
Protein, ur: NEGATIVE
Specific Gravity, Urine: 1.019 (ref 1.001–1.035)
pH: 6.5 (ref 5.0–8.0)

## 2022-06-10 LAB — COMPLETE METABOLIC PANEL WITH GFR
AG Ratio: 2 (calc) (ref 1.0–2.5)
ALT: 31 U/L (ref 9–46)
AST: 28 U/L (ref 10–35)
Albumin: 4.8 g/dL (ref 3.6–5.1)
Alkaline phosphatase (APISO): 33 U/L — ABNORMAL LOW (ref 35–144)
BUN: 15 mg/dL (ref 7–25)
CO2: 30 mmol/L (ref 20–32)
Calcium: 10.6 mg/dL — ABNORMAL HIGH (ref 8.6–10.3)
Chloride: 102 mmol/L (ref 98–110)
Creat: 0.96 mg/dL (ref 0.70–1.35)
Globulin: 2.4 g/dL (calc) (ref 1.9–3.7)
Glucose, Bld: 93 mg/dL (ref 65–99)
Potassium: 4.5 mmol/L (ref 3.5–5.3)
Sodium: 140 mmol/L (ref 135–146)
Total Bilirubin: 0.6 mg/dL (ref 0.2–1.2)
Total Protein: 7.2 g/dL (ref 6.1–8.1)
eGFR: 86 mL/min/{1.73_m2} (ref >=60)

## 2022-06-10 LAB — HEMOGLOBIN A1C
Hgb A1c MFr Bld: 6.7 % of total Hgb — ABNORMAL HIGH (ref <5.7)
Mean Plasma Glucose: 146 mg/dL
eAG (mmol/L): 8.1 mmol/L

## 2022-06-10 LAB — MICROALBUMIN / CREATININE URINE RATIO
Creatinine, Urine: 106 mg/dL (ref 20–320)
Microalb Creat Ratio: 5 mcg/mg creat (ref <30)
Microalb, Ur: 0.5 mg/dL

## 2022-06-10 LAB — LIPID PANEL
Cholesterol: 131 mg/dL (ref <200)
HDL: 51 mg/dL (ref >=40)
LDL Cholesterol (Calc): 61 mg/dL (calc)
Non-HDL Cholesterol (Calc): 80 mg/dL (calc) (ref <130)
Total CHOL/HDL Ratio: 2.6 (calc) (ref <5.0)
Triglycerides: 102 mg/dL (ref <150)

## 2022-06-10 LAB — VITAMIN D 25 HYDROXY (VIT D DEFICIENCY, FRACTURES): Vit D, 25-Hydroxy: 32 ng/mL (ref 30–100)

## 2022-06-10 LAB — TSH: TSH: 2.99 mIU/L (ref 0.40–4.50)

## 2022-06-10 LAB — PSA: PSA: 1.57 ng/mL (ref <=4.00)

## 2022-06-10 LAB — MAGNESIUM: Magnesium: 2.2 mg/dL (ref 1.5–2.5)

## 2022-06-10 NOTE — Progress Notes (Signed)
Sent message, via epic in basket, requesting orders in epic from surgeon.  

## 2022-06-13 NOTE — Progress Notes (Signed)
Anesthesia Review:  PCP: Mickle Asper, NP [facility] 06/09/22  Cardiologist : Melina Copa, [facility] 06/07/22  Chest x-ray : 04/17/22- 1 view  EKG : 04/20/22  Carotids- 02/26/22  CT Cors- 12/06/21  Echo : 2019  Stress test: Cardiac Cath :  Activity level:  Sleep Study/ CPAP : Fasting Blood Sugar :      / Checks Blood Sugar -- times a day:   Blood Thinner/ Instructions /Last Dose: ASA / Instructions/ Last Dose :  06/10/22-hgba1c- 6.7  06/09/22- cbc and CMP- epic

## 2022-06-13 NOTE — Patient Instructions (Addendum)
SURGICAL WAITING ROOM VISITATION Patients having surgery or a procedure may have no more than 2 support people in the waiting area - these visitors may rotate.   Children under the age of 60 must have an adult with them who is not the patient. If the patient needs to stay at the hospital during part of their recovery, the visitor guidelines for inpatient rooms apply. Pre-op nurse will coordinate an appropriate time for 1 support person to accompany patient in pre-op.  This support person may not rotate.    Please refer to the [facility] website for the visitor guidelines for Inpatients (after your surgery is over and you are in a regular room).       Your procedure is scheduled on:   06/17/22    Report to [facility] Main Entrance    Report to admitting at  1200 noon    Call this number if you have problems the morning of surgery [phone]   Do not eat food :After Midnight.   After Midnight you may have the following liquids until ___ 1100___ AM DAY OF SURGERY  Water Non-Citrus Juices (without pulp, NO RED) Carbonated Beverages Black Coffee (NO MILK/CREAM OR CREAMERS, sugar ok)  Clear Tea (NO MILK/CREAM OR CREAMERS, sugar ok) regular and decaf                             Plain Jell-O (NO RED)                                           Fruit ices (not with fruit pulp, NO RED)                                     Popsicles (NO RED)                                                               Sports drinks like Gatorade (NO RED)                    The day of surgery:  Drink ONE (1) Pre-Surgery Clear Ensure or G2 at   1100AM the morning of surgery. Drink in one sitting. Do not sip.  This drink was given to you during your hospital  pre-op appointment visit. Nothing else to drink after completing the  Pre-Surgery Clear Ensure or G2.          If you have questions, please contact your surgeon's office.       Oral Hygiene is also important to reduce your risk of  infection.                                    Remember - BRUSH YOUR TEETH THE MORNING OF SURGERY WITH YOUR REGULAR TOOTHPASTE  DENTURES WILL BE REMOVED PRIOR TO SURGERY PLEASE DO NOT APPLY "Poly grip" OR ADHESIVES!!!   Do NOT smoke after Midnight   Take these medicines the morning of surgery  with A SIP OF WATER:  amlodipine, coreg, protonix   DO NOT TAKE ANY ORAL DIABETIC MEDICATIONS DAY OF YOUR SURGERY  Bring CPAP mask and tubing day of surgery.                              You may not have any metal on your body including hair pins, jewelry, and body piercing             Do not wear make-up, lotions, powders, perfumes/cologne, or deodorant  Do not wear nail polish including gel and S&S, artificial/acrylic nails, or any other type of covering on natural nails including finger and toenails. If you have artificial nails, gel coating, etc. that needs to be removed by a nail salon please have this removed prior to surgery or surgery may need to be canceled/ delayed if the surgeon/ anesthesia feels like they are unable to be safely monitored.   Do not shave  48 hours prior to surgery.               Men may shave face and neck.   Do not bring valuables to the hospital. [place].   Contacts, glasses, dentures or bridgework may not be worn into surgery.   Bring small overnight bag day of surgery.   DO NOT [place]. PHARMACY WILL DISPENSE MEDICATIONS LISTED ON YOUR MEDICATION LIST TO YOU DURING YOUR ADMISSION [place]!    Patients discharged on the day of surgery will not be allowed to drive home.  Someone NEEDS to stay with you for the first 24 hours after anesthesia.   Special Instructions: Bring a copy of your healthcare power of attorney and living will documents the day of surgery if you haven't scanned them before.              Please read over the following fact sheets you were given: IF  [place] [phone]   If you received a COVID test during your pre-op visit  it is requested that you wear a mask when out in public, stay away from anyone that may not be feeling well and notify your surgeon if you develop symptoms. If you test positive for Covid or have been in contact with anyone that has tested positive in the last 10 days please notify you surgeon.    [place] - Preparing for Surgery Before surgery, you can play an important role.  Because skin is not sterile, your skin needs to be as free of germs as possible.  You can reduce the number of germs on your skin by washing with CHG (chlorahexidine gluconate) soap before surgery.  CHG is an antiseptic cleaner which kills germs and bonds with the skin to continue killing germs even after washing. Please DO NOT use if you have an allergy to CHG or antibacterial soaps.  If your skin becomes reddened/irritated stop using the CHG and inform your nurse when you arrive at Short Stay. Do not shave (including legs and underarms) for at least 48 hours prior to the first CHG shower.  You may shave your face/neck. Please follow these instructions carefully:  1.  Shower with CHG Soap the night before surgery and the  morning of Surgery.  2.  If you  choose to wash your hair, wash your hair first as usual with your  normal  shampoo.  3.  After you shampoo, rinse your hair and body thoroughly to remove the  shampoo.                           4.  Use CHG as you would any other liquid soap.  You can apply chg directly  to the skin and wash                       Gently with a scrungie or clean washcloth.  5.  Apply the CHG Soap to your body ONLY FROM THE NECK DOWN.   Do not use on face/ open                           Wound or open sores. Avoid contact with eyes, ears mouth and genitals (private parts).                       Wash face,  Genitals (private parts) with your normal soap.              6.  Wash thoroughly, paying special attention to the area where your surgery  will be performed.  7.  Thoroughly rinse your body with warm water from the neck down.  8.  DO NOT shower/wash with your normal soap after using and rinsing off  the CHG Soap.                9.  Pat yourself dry with a clean towel.            10.  Wear clean pajamas.            11.  Place clean sheets on your bed the night of your first shower and do not  sleep with pets. Day of Surgery : Do not apply any lotions/deodorants the morning of surgery.  Please wear clean clothes to the hospital/surgery center.  FAILURE TO FOLLOW THESE INSTRUCTIONS MAY RESULT IN THE CANCELLATION OF YOUR SURGERY PATIENT SIGNATURE_________________________________  NURSE SIGNATURE__________________________________  ________________________________________________________________________

## 2022-06-14 ENCOUNTER — Encounter (HOSPITAL_COMMUNITY)
Admission: RE | Admit: 2022-06-14 | Discharge: 2022-06-14 | Disposition: A | Discharge status: Home / Self Care | Payer: BC Managed Care – PPO | Source: Ambulatory Visit | Attending: General Surgery | Admitting: General Surgery

## 2022-06-14 ENCOUNTER — Ambulatory Visit: Payer: BC Managed Care – PPO | Admitting: Internal Medicine

## 2022-06-14 ENCOUNTER — Other Ambulatory Visit: Payer: Self-pay

## 2022-06-14 ENCOUNTER — Encounter (HOSPITAL_COMMUNITY): Payer: Self-pay

## 2022-06-14 ENCOUNTER — Telehealth: Payer: Self-pay

## 2022-06-14 DIAGNOSIS — K449 Diaphragmatic hernia without obstruction or gangrene: Secondary | ICD-10-CM | POA: Diagnosis not present

## 2022-06-14 DIAGNOSIS — Z01812 Encounter for preprocedural laboratory examination: Secondary | ICD-10-CM | POA: Insufficient documentation

## 2022-06-14 DIAGNOSIS — E8881 Metabolic syndrome: Secondary | ICD-10-CM | POA: Diagnosis not present

## 2022-06-14 HISTORY — DX: Prediabetes: R73.03

## 2022-06-14 NOTE — Telephone Encounter (Signed)
Called pt to let him know he can hold aspirin now until procedure on 12/15. Pt scheduled for follow up appointment with Dr. Bryan Lemma on 07/21/22 at 3 pm. Pt verbalized understanding.

## 2022-06-14 NOTE — Addendum Note (Signed)
Addended by: Berniece Salines A on: 06/14/2022 10:29 AM   Modules accepted: Orders

## 2022-06-14 NOTE — Telephone Encounter (Signed)
-----   Message from [place], DO sent at 06/14/2022  4:36 PM EST ----- Go ahead and have him hold it. Thanks!  ----- Message ----- From: Marice Potter, RN Sent: 06/14/2022  10:44 AM EST To: Lavena Bullion, DO  Sorry this is a little late and I'm sure [facility] already has this figured out but the cardiac clearance fax says that if pt needs to hold aspirin he can. Pt is taking 81 mg, so would he need to hold it or is it okay for him to continue?

## 2022-06-15 ENCOUNTER — Encounter: Payer: Self-pay | Admitting: Internal Medicine

## 2022-06-15 NOTE — Progress Notes (Signed)
Anesthesia Chart Review   Case: [id number] Date/Time: 06/17/22 1345   Procedures:      LAPAROSCOPIC REPAIR OF HIATAL HERNIA WITH TIF     ESOPHAGOGASTRODUODENOSCOPY (EGD)     TRANSORAL INCISIONLESS FUNDOPLICATION   Anesthesia type: General   Pre-op diagnosis: SLIDING HIATAL HERNIA   Location: [facility] ROOM 01 / [facility]   Surgeons: Greer Pickerel, MD; Lavena Bullion, DO       DISCUSSION:71 y.o. never smoker with h/o HTN, RBBB, nonobstructive CAD, hiatal hernia scheduled for above procedure 06/17/2022 with Dr. Greer Pickerel and Dr. Gerrit Heck.   Pt seen by cardiology 06/07/2022. Per OV note, "Preoperative Cardiovascular Risk Assessment: RCRI 0.9% indicating low CV risk. The patient affirms he has been doing well without any new cardiac symptoms. They are able to achieve over 4 METS without cardiac limitations. Therefore, based on ACC/AHA guidelines, the patient would be at acceptable risk for the planned procedure without further cardiovascular testing. I did reach out to Dr. Ellyn Hack to review his case given the abnormal FFR and Dr. Ellyn Hack agreed that as long as no new symptoms, he would be fine to proceed without delay. The patient was advised that if he develops new symptoms prior to surgery to contact our office to arrange for a follow-up visit, and he verbalized understanding. "  Anticipate pt can proceed with planned procedure barring acute status change.   VS: BP (!) 143/78   Pulse (!) 57   Temp 36.9 C (Oral)   Resp 16   Ht '6\' 1"'$  (1.854 m)   Wt 102.5 kg   SpO2 98%   BMI 29.82 kg/m   PROVIDERS: Unk Pinto, MD is PCP    LABS: Labs reviewed: Acceptable for surgery. (all labs ordered are listed, but only abnormal results are displayed)  Labs Reviewed  TYPE AND SCREEN     IMAGES:   EKG:   CV: Echo 07/07/2017 - Left ventricle: The cavity size was normal. Systolic function was    normal. The estimated ejection fraction was in the range of 55%    to 60%. Wall motion  was normal; there were no regional wall    motion abnormalities. Doppler parameters are consistent with    abnormal left ventricular relaxation (grade 1 diastolic    dysfunction).  Past Medical History:  Diagnosis Date   Arthritis    CAD (coronary artery disease) 07/13/2021   Cor Ca++ Score 837.  Mixed mod plaque prox RCA(50 to 69%).  Mild Ca++ plaque mid RCA (25-49%).  Minimal Ca++ plaque prox LCx (0-24%).  Mid LCx CA++ plaque (50 to 69%), Prox OM1 CA++ plaque (50-69%).  Prox LAD mild to 0-24%. LM  mild 0-24% Ca++  plaque. => CT FFR + for distal LCx (small caliber)  Negative findings in the LAD, OM1 and  and RCA. =>  Would not recommend catheterization.   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 Hilarie Fredrickson:  Low-grade of narrowing Schatzki ring. Dilated to 18 mm with balloon. - 3 cm hiatal hernia. - Normal stomach. 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    Gastroparesis    GERD (gastroesophageal reflux disease)    Hiatal hernia    History of colon polyps    Hyperlipidemia    Hypertension    No  sign of renal artery stenosis   IBS (irritable bowel syndrome)    Pre-diabetes    RBBB    Vertigo    Vitamin D deficiency     Past Surgical History:  Procedure Laterality Date   ABDOMINAL SURGERY  1956   Related to bleeding episode.    BIOPSY  01/27/2020   Procedure: BIOPSY;  Surgeon: Rush Landmark Telford Nab., MD;  Location: [place];  Service: Gastroenterology;;   BIOPSY  01/21/2021   Procedure: BIOPSY;  Surgeon: Irving Copas., MD;  Location: [place];  Service: Gastroenterology;;   CARDIAC CATHETERIZATION  2005   no intervention per patient   CARPAL TUNNEL RELEASE Left 03/06/2020   Dr. Amedeo Plenty   COCHLEAR IMPLANT Bilateral    COLONOSCOPY     several -  Last one 11/22/19   CYST REMOVAL HAND Left 1975   [place]   ENDOSCOPIC MUCOSAL RESECTION N/A 01/27/2020   Procedure: ENDOSCOPIC MUCOSAL RESECTION;  Surgeon: Irving Copas., MD;  Location: [place];  Service: Gastroenterology;  Laterality: N/A;   ENTEROSCOPY N/A 01/27/2020   Procedure: ENTEROSCOPY;  Surgeon: Rush Landmark Telford Nab., MD;  Location: [place];  Service: Gastroenterology;  Laterality: N/A;   ESOPHAGEAL MANOMETRY N/A 05/18/2022   Procedure: ESOPHAGEAL MANOMETRY (EM);  Surgeon: Jerene Bears, MD;  [facility];  Service: Gastroenterology;  Laterality: N/A;   ESOPHAGOGASTRODUODENOSCOPY (EGD) WITH PROPOFOL N/A 01/21/2021   Procedure: ESOPHAGOGASTRODUODENOSCOPY (EGD) WITH PROPOFOL;  Surgeon: Rush Landmark Telford Nab., MD;  Location: [place];  Service: Gastroenterology;  Laterality: N/A;   HEMOSTASIS CLIP PLACEMENT  01/27/2020   Procedure: HEMOSTASIS CLIP PLACEMENT;  Surgeon: Rush Landmark Telford Nab., MD;  Location: [place];  Service: Gastroenterology;;   SKIN LESION EXCISION     STAPEDECTOMY Bilateral    x 2 - 10 yrs apart - No MRIs per patient.   SUBMUCOSAL LIFTING INJECTION  01/27/2020   Procedure: SUBMUCOSAL LIFTING INJECTION;  Surgeon: Rush Landmark Telford Nab., MD;  Location: [place];  Service: Gastroenterology;;   SUBMUCOSAL TATTOO INJECTION  01/27/2020   Procedure: SUBMUCOSAL TATTOO INJECTION;  Surgeon: Irving Copas., MD;  Location: [place];  Service: Gastroenterology;;   TRANSTHORACIC ECHOCARDIOGRAM  07/2017   Normal LV size and function.  EF 55-60%.  No RWMA.  Basal septal hypertrophy, but no sign of significant hypertensive heart disease.  Only GR 1 DD.   TRIGGER FINGER RELEASE Right 2014   Thumb   UPPER GI ENDOSCOPY     several - last one 11/22/2019   WISDOM TOOTH EXTRACTION      MEDICATIONS:  amLODipine (NORVASC) 10 MG tablet   Ascorbic Acid (VITAMIN C) 1000 MG tablet   aspirin 81 MG tablet   BIOTIN PO   carvedilol  (COREG) 12.5 MG tablet   ezetimibe (ZETIA) 10 MG tablet   famotidine (PEPCID) 20 MG tablet   gabapentin (NEURONTIN) 300 MG capsule   Magnesium 400 MG TABS   olmesartan-hydrochlorothiazide (BENICAR HCT) 40-25 MG tablet   pantoprazole (PROTONIX) 40 MG tablet   rosuvastatin (CRESTOR) 40 MG tablet   Thiamine HCl (VITAMIN B-1) 250 MG tablet   vitamin E 200 UNIT capsule   No current facility-administered medications for this encounter.      Konrad Felix Ward, PA-C [facility] [phone]

## 2022-06-16 ENCOUNTER — Encounter: Payer: BC Managed Care – PPO | Admitting: Nurse Practitioner

## 2022-06-17 ENCOUNTER — Other Ambulatory Visit: Payer: Self-pay

## 2022-06-17 ENCOUNTER — Encounter (HOSPITAL_COMMUNITY)
Admission: RE | Disposition: A | Discharge status: Home / Self Care | Payer: Self-pay | Source: Home / Self Care | Attending: Internal Medicine

## 2022-06-17 ENCOUNTER — Encounter (HOSPITAL_COMMUNITY): Payer: Self-pay | Admitting: General Surgery

## 2022-06-17 ENCOUNTER — Observation Stay (HOSPITAL_COMMUNITY)
Admission: RE | Admit: 2022-06-17 | Discharge: 2022-06-18 | Disposition: A | Discharge status: Home / Self Care | Payer: BC Managed Care – PPO | Attending: Internal Medicine | Admitting: Internal Medicine

## 2022-06-17 ENCOUNTER — Ambulatory Visit (HOSPITAL_COMMUNITY): Payer: BC Managed Care – PPO | Admitting: Certified Registered"

## 2022-06-17 ENCOUNTER — Ambulatory Visit (HOSPITAL_COMMUNITY): Payer: BC Managed Care – PPO | Admitting: Physician Assistant

## 2022-06-17 DIAGNOSIS — I890 Lymphedema, not elsewhere classified: Secondary | ICD-10-CM | POA: Diagnosis not present

## 2022-06-17 DIAGNOSIS — I10 Essential (primary) hypertension: Secondary | ICD-10-CM | POA: Diagnosis not present

## 2022-06-17 DIAGNOSIS — K222 Esophageal obstruction: Secondary | ICD-10-CM

## 2022-06-17 DIAGNOSIS — E1165 Type 2 diabetes mellitus with hyperglycemia: Secondary | ICD-10-CM | POA: Diagnosis not present

## 2022-06-17 DIAGNOSIS — Z8719 Personal history of other diseases of the digestive system: Secondary | ICD-10-CM

## 2022-06-17 DIAGNOSIS — E1140 Type 2 diabetes mellitus with diabetic neuropathy, unspecified: Secondary | ICD-10-CM | POA: Insufficient documentation

## 2022-06-17 DIAGNOSIS — I1A0 Resistant hypertension: Secondary | ICD-10-CM | POA: Diagnosis present

## 2022-06-17 DIAGNOSIS — K449 Diaphragmatic hernia without obstruction or gangrene: Principal | ICD-10-CM

## 2022-06-17 DIAGNOSIS — Z79899 Other long term (current) drug therapy: Secondary | ICD-10-CM | POA: Diagnosis not present

## 2022-06-17 DIAGNOSIS — E119 Type 2 diabetes mellitus without complications: Secondary | ICD-10-CM

## 2022-06-17 DIAGNOSIS — K219 Gastro-esophageal reflux disease without esophagitis: Secondary | ICD-10-CM | POA: Diagnosis not present

## 2022-06-17 DIAGNOSIS — Z7982 Long term (current) use of aspirin: Secondary | ICD-10-CM | POA: Diagnosis not present

## 2022-06-17 DIAGNOSIS — R1110 Vomiting, unspecified: Secondary | ICD-10-CM

## 2022-06-17 DIAGNOSIS — Z9889 Other specified postprocedural states: Secondary | ICD-10-CM

## 2022-06-17 DIAGNOSIS — E1169 Type 2 diabetes mellitus with other specified complication: Secondary | ICD-10-CM | POA: Diagnosis present

## 2022-06-17 DIAGNOSIS — I2510 Atherosclerotic heart disease of native coronary artery without angina pectoris: Secondary | ICD-10-CM | POA: Diagnosis not present

## 2022-06-17 DIAGNOSIS — R12 Heartburn: Secondary | ICD-10-CM

## 2022-06-17 DIAGNOSIS — R1013 Epigastric pain: Secondary | ICD-10-CM

## 2022-06-17 HISTORY — PX: ESOPHAGOGASTRODUODENOSCOPY: SHX5428

## 2022-06-17 HISTORY — PX: HIATAL HERNIA REPAIR: SHX195

## 2022-06-17 HISTORY — PX: TRANSORAL INCISIONLESS FUNDOPLICATION: SHX6840

## 2022-06-17 LAB — TYPE AND SCREEN
ABO/RH(D): A POS
Antibody Screen: NEGATIVE

## 2022-06-17 LAB — GLUCOSE, CAPILLARY: Glucose-Capillary: 191 mg/dL — ABNORMAL HIGH (ref 70–99)

## 2022-06-17 LAB — ABO/RH: ABO/RH(D): A POS

## 2022-06-17 SURGERY — REPAIR, HERNIA, HIATAL, LAPAROSCOPIC
Anesthesia: General

## 2022-06-17 MED ORDER — LIDOCAINE VISCOUS HCL 2 % MT SOLN: 5.0000 mL | Freq: Three times a day (TID) | OROMUCOSAL | Status: DC | PRN

## 2022-06-17 MED ORDER — FENTANYL CITRATE (PF) 100 MCG/2ML IJ SOLN
INTRAMUSCULAR | Status: DC | PRN
  Administered 2022-06-17: 100 ug via INTRAVENOUS

## 2022-06-17 MED ORDER — CARVEDILOL 12.5 MG PO TABS
12.5000 mg | ORAL_TABLET | Freq: Two times a day (BID) | ORAL | Status: DC
  Administered 2022-06-17 – 2022-06-18 (×2): 12.5 mg via ORAL
  Filled 2022-06-17 (×2): qty 1

## 2022-06-17 MED ORDER — ONDANSETRON HCL 4 MG/2ML IJ SOLN
INTRAMUSCULAR | Status: AC
  Filled 2022-06-17: qty 2

## 2022-06-17 MED ORDER — DEXAMETHASONE SODIUM PHOSPHATE 10 MG/ML IJ SOLN
INTRAMUSCULAR | Status: AC
  Filled 2022-06-17: qty 1

## 2022-06-17 MED ORDER — DEXAMETHASONE SODIUM PHOSPHATE 10 MG/ML IJ SOLN
8.0000 mg | Freq: Four times a day (QID) | INTRAMUSCULAR | Status: DC
  Administered 2022-06-17 – 2022-06-18 (×3): 8 mg via INTRAVENOUS
  Filled 2022-06-17 (×3): qty 1

## 2022-06-17 MED ORDER — ROCURONIUM BROMIDE 10 MG/ML (PF) SYRINGE
PREFILLED_SYRINGE | INTRAVENOUS | Status: DC | PRN
  Administered 2022-06-17: 10 mg via INTRAVENOUS
  Administered 2022-06-17: 40 mg via INTRAVENOUS
  Administered 2022-06-17: 20 mg via INTRAVENOUS

## 2022-06-17 MED ORDER — METOCLOPRAMIDE HCL 5 MG/ML IJ SOLN
10.0000 mg | Freq: Four times a day (QID) | INTRAMUSCULAR | Status: DC
  Administered 2022-06-17 – 2022-06-18 (×3): 10 mg via INTRAVENOUS
  Filled 2022-06-17 (×3): qty 2

## 2022-06-17 MED ORDER — ROCURONIUM BROMIDE 10 MG/ML (PF) SYRINGE
PREFILLED_SYRINGE | INTRAVENOUS | Status: AC
  Filled 2022-06-17: qty 10

## 2022-06-17 MED ORDER — SIMETHICONE 80 MG PO CHEW
80.0000 mg | CHEWABLE_TABLET | Freq: Four times a day (QID) | ORAL | Status: DC | PRN
  Administered 2022-06-18: 80 mg via ORAL
  Filled 2022-06-17: qty 1

## 2022-06-17 MED ORDER — PROPOFOL 10 MG/ML IV BOLUS
INTRAVENOUS | Status: DC | PRN
  Administered 2022-06-17: 200 mg via INTRAVENOUS

## 2022-06-17 MED ORDER — BUPIVACAINE LIPOSOME 1.3 % IJ SUSP: 20.0000 mL | Freq: Once | INTRAMUSCULAR | Status: DC

## 2022-06-17 MED ORDER — LACTATED RINGERS IV SOLN: INTRAVENOUS | Status: DC

## 2022-06-17 MED ORDER — EPHEDRINE 5 MG/ML INJ
INTRAVENOUS | Status: AC
  Filled 2022-06-17: qty 5

## 2022-06-17 MED ORDER — HEPARIN SODIUM (PORCINE) 5000 UNIT/ML IJ SOLN
5000.0000 [IU] | Freq: Once | INTRAMUSCULAR | Status: AC
  Administered 2022-06-17: 5000 [IU] via SUBCUTANEOUS
  Filled 2022-06-17: qty 1

## 2022-06-17 MED ORDER — CHLORHEXIDINE GLUCONATE CLOTH 2 % EX PADS: 6.0000 | MEDICATED_PAD | Freq: Once | CUTANEOUS | Status: DC

## 2022-06-17 MED ORDER — BUPIVACAINE-EPINEPHRINE 0.5% -1:200000 IJ SOLN
INTRAMUSCULAR | Status: DC | PRN
  Administered 2022-06-17: 30 mL

## 2022-06-17 MED ORDER — PROPOFOL 10 MG/ML IV BOLUS
INTRAVENOUS | Status: AC
  Filled 2022-06-17: qty 20

## 2022-06-17 MED ORDER — SCOPOLAMINE 1 MG/3DAYS TD PT72
1.0000 | MEDICATED_PATCH | TRANSDERMAL | Status: DC
  Administered 2022-06-17: 1.5 mg via TRANSDERMAL
  Filled 2022-06-17: qty 1

## 2022-06-17 MED ORDER — ONDANSETRON HCL 4 MG/2ML IJ SOLN
4.0000 mg | Freq: Once | INTRAMUSCULAR | Status: AC
  Administered 2022-06-17: 4 mg via INTRAVENOUS

## 2022-06-17 MED ORDER — ACETAMINOPHEN 325 MG PO TABS: 650.0000 mg | ORAL_TABLET | Freq: Four times a day (QID) | ORAL | Status: DC | PRN

## 2022-06-17 MED ORDER — SUCCINYLCHOLINE CHLORIDE 200 MG/10ML IV SOSY
PREFILLED_SYRINGE | INTRAVENOUS | Status: DC | PRN
  Administered 2022-06-17: 140 mg via INTRAVENOUS

## 2022-06-17 MED ORDER — CHLORHEXIDINE GLUCONATE 0.12 % MT SOLN: 15.0000 mL | Freq: Once | OROMUCOSAL | Status: DC

## 2022-06-17 MED ORDER — SODIUM CHLORIDE 0.9 % IV SOLN: INTRAVENOUS | Status: DC

## 2022-06-17 MED ORDER — INSULIN ASPART 100 UNIT/ML IJ SOLN
0.0000 [IU] | Freq: Three times a day (TID) | INTRAMUSCULAR | Status: DC
  Administered 2022-06-18: 3 [IU] via SUBCUTANEOUS
  Administered 2022-06-18: 2 [IU] via SUBCUTANEOUS

## 2022-06-17 MED ORDER — CEFAZOLIN SODIUM-DEXTROSE 2-4 GM/100ML-% IV SOLN
2.0000 g | INTRAVENOUS | Status: AC
  Administered 2022-06-17: 2 g via INTRAVENOUS
  Filled 2022-06-17: qty 100

## 2022-06-17 MED ORDER — ONDANSETRON HCL 4 MG/2ML IJ SOLN
4.0000 mg | Freq: Four times a day (QID) | INTRAMUSCULAR | Status: DC
  Administered 2022-06-17 – 2022-06-18 (×4): 4 mg via INTRAVENOUS
  Filled 2022-06-17 (×4): qty 2

## 2022-06-17 MED ORDER — KETAMINE HCL 50 MG/5ML IJ SOSY
PREFILLED_SYRINGE | INTRAMUSCULAR | Status: AC
  Filled 2022-06-17: qty 5

## 2022-06-17 MED ORDER — SCOPOLAMINE 1 MG/3DAYS TD PT72: 1.0000 | MEDICATED_PATCH | Freq: Once | TRANSDERMAL | Status: DC

## 2022-06-17 MED ORDER — CEFAZOLIN SODIUM-DEXTROSE 2-4 GM/100ML-% IV SOLN: 2.0000 g | Freq: Once | INTRAVENOUS | Status: DC

## 2022-06-17 MED ORDER — FENTANYL CITRATE (PF) 100 MCG/2ML IJ SOLN
INTRAMUSCULAR | Status: AC
  Filled 2022-06-17: qty 2

## 2022-06-17 MED ORDER — ACETAMINOPHEN 500 MG PO TABS
1000.0000 mg | ORAL_TABLET | ORAL | Status: AC
  Administered 2022-06-17: 1000 mg via ORAL
  Filled 2022-06-17: qty 2

## 2022-06-17 MED ORDER — LIDOCAINE 2% (20 MG/ML) 5 ML SYRINGE
INTRAMUSCULAR | Status: DC | PRN
  Administered 2022-06-17: 60 mg via INTRAVENOUS

## 2022-06-17 MED ORDER — BUPIVACAINE LIPOSOME 1.3 % IJ SUSP
INTRAMUSCULAR | Status: AC
  Filled 2022-06-17: qty 20

## 2022-06-17 MED ORDER — ONDANSETRON HCL 4 MG/2ML IJ SOLN: 4.0000 mg | Freq: Once | INTRAMUSCULAR | Status: DC | PRN

## 2022-06-17 MED ORDER — PANTOPRAZOLE SODIUM 40 MG PO TBEC
40.0000 mg | DELAYED_RELEASE_TABLET | Freq: Two times a day (BID) | ORAL | Status: DC
  Administered 2022-06-17 – 2022-06-18 (×2): 40 mg via ORAL
  Filled 2022-06-17 (×2): qty 1

## 2022-06-17 MED ORDER — PANTOPRAZOLE SODIUM 40 MG PO TBEC: 40.0000 mg | DELAYED_RELEASE_TABLET | Freq: Two times a day (BID) | ORAL | Status: DC

## 2022-06-17 MED ORDER — MIDAZOLAM HCL 2 MG/2ML IJ SOLN
INTRAMUSCULAR | Status: AC
  Filled 2022-06-17: qty 2

## 2022-06-17 MED ORDER — SUGAMMADEX SODIUM 200 MG/2ML IV SOLN
INTRAVENOUS | Status: DC | PRN
  Administered 2022-06-17: 200 mg via INTRAVENOUS

## 2022-06-17 MED ORDER — ORAL CARE MOUTH RINSE: 15.0000 mL | Freq: Once | OROMUCOSAL | Status: DC

## 2022-06-17 MED ORDER — AMISULPRIDE (ANTIEMETIC) 5 MG/2ML IV SOLN: 10.0000 mg | Freq: Once | INTRAVENOUS | Status: DC | PRN

## 2022-06-17 MED ORDER — ACETAMINOPHEN 500 MG PO TABS: 1000.0000 mg | ORAL_TABLET | Freq: Once | ORAL | Status: DC

## 2022-06-17 MED ORDER — ACETAMINOPHEN 650 MG RE SUPP: 650.0000 mg | Freq: Four times a day (QID) | RECTAL | Status: DC | PRN

## 2022-06-17 MED ORDER — HYDROMORPHONE HCL 1 MG/ML IJ SOLN
0.2500 mg | INTRAMUSCULAR | Status: DC | PRN
  Administered 2022-06-17: 0.5 mg via INTRAVENOUS

## 2022-06-17 MED ORDER — BUPIVACAINE LIPOSOME 1.3 % IJ SUSP
INTRAMUSCULAR | Status: DC | PRN
  Administered 2022-06-17: 20 mL

## 2022-06-17 MED ORDER — ACETAMINOPHEN-CODEINE 120-12 MG/5ML PO SOLN
15.0000 mL | ORAL | Status: DC | PRN
  Administered 2022-06-17 – 2022-06-18 (×2): 15 mL via ORAL
  Filled 2022-06-17 (×2): qty 15

## 2022-06-17 MED ORDER — LIDOCAINE HCL (PF) 2 % IJ SOLN
INTRAMUSCULAR | Status: AC
  Filled 2022-06-17: qty 5

## 2022-06-17 MED ORDER — HYDROMORPHONE HCL 1 MG/ML IJ SOLN
INTRAMUSCULAR | Status: AC
  Administered 2022-06-17: 0.5 mg via INTRAVENOUS
  Filled 2022-06-17: qty 1

## 2022-06-17 MED ORDER — 0.9 % SODIUM CHLORIDE (POUR BTL) OPTIME
TOPICAL | Status: DC | PRN
  Administered 2022-06-17: 1000 mL

## 2022-06-17 MED ORDER — DEXAMETHASONE SODIUM PHOSPHATE 10 MG/ML IJ SOLN
INTRAMUSCULAR | Status: DC | PRN
  Administered 2022-06-17: 8 mg via INTRAVENOUS

## 2022-06-17 MED ORDER — MIDAZOLAM HCL 2 MG/2ML IJ SOLN
INTRAMUSCULAR | Status: DC | PRN
  Administered 2022-06-17: 2 mg via INTRAVENOUS

## 2022-06-17 MED ORDER — EPHEDRINE SULFATE-NACL 50-0.9 MG/10ML-% IV SOSY
PREFILLED_SYRINGE | INTRAVENOUS | Status: DC | PRN
  Administered 2022-06-17 (×3): 5 mg via INTRAVENOUS

## 2022-06-17 MED ORDER — BUPIVACAINE-EPINEPHRINE (PF) 0.5% -1:200000 IJ SOLN
INTRAMUSCULAR | Status: AC
  Filled 2022-06-17: qty 30

## 2022-06-17 MED ORDER — DEXAMETHASONE SODIUM PHOSPHATE 4 MG/ML IJ SOLN: 4.0000 mg | INTRAMUSCULAR | Status: DC

## 2022-06-17 MED ORDER — OXYCODONE HCL 5 MG/5ML PO SOLN: 5.0000 mg | Freq: Once | ORAL | Status: DC | PRN

## 2022-06-17 MED ORDER — FAMOTIDINE IN NACL 20-0.9 MG/50ML-% IV SOLN
20.0000 mg | Freq: Once | INTRAVENOUS | Status: AC
  Administered 2022-06-17: 20 mg via INTRAVENOUS
  Filled 2022-06-17 (×2): qty 50

## 2022-06-17 MED ORDER — KETAMINE HCL 10 MG/ML IJ SOLN
INTRAMUSCULAR | Status: DC | PRN
  Administered 2022-06-17: 40 mg via INTRAVENOUS

## 2022-06-17 MED ORDER — OXYCODONE HCL 5 MG PO TABS: 5.0000 mg | ORAL_TABLET | Freq: Once | ORAL | Status: DC | PRN

## 2022-06-17 SURGICAL SUPPLY — 50 items
APPLIER CLIP ROT 10 11.4 M/L (STAPLE)
BAG COUNTER SPONGE SURGICOUNT (BAG) IMPLANT
CABLE HIGH FREQUENCY MONO STRZ (ELECTRODE) IMPLANT
CHLORAPREP W/TINT 26 (MISCELLANEOUS) ×1 IMPLANT
CLIP APPLIE ROT 10 11.4 M/L (STAPLE) IMPLANT
DERMABOND ADVANCED .7 DNX12 (GAUZE/BANDAGES/DRESSINGS) IMPLANT
DEVICE SUT QUICK LOAD TK 5 (SUTURE) ×1 IMPLANT
DEVICE SUT TI-KNOT TK 5X26 (SUTURE) ×1 IMPLANT
DEVICE SUTURE ENDOST 10MM (ENDOMECHANICALS) ×1 IMPLANT
DISSECTOR BLUNT TIP ENDO 5MM (MISCELLANEOUS) ×1 IMPLANT
DRAIN PENROSE 0.5X18 (DRAIN) IMPLANT
DRAPE UTILITY XL STRL (DRAPES) IMPLANT
DRSG TEGADERM 2-3/8X2-3/4 SM (GAUZE/BANDAGES/DRESSINGS) ×5 IMPLANT
ELECT REM PT RETURN 15FT ADLT (MISCELLANEOUS) ×1 IMPLANT
GAUZE 4X4 16PLY ~~LOC~~+RFID DBL (SPONGE) IMPLANT
GAUZE SPONGE 2X2 8PLY STRL LF (GAUZE/BANDAGES/DRESSINGS) ×1 IMPLANT
GLOVE BIO SURGEON STRL SZ7.5 (GLOVE) ×1 IMPLANT
GLOVE INDICATOR 8.0 STRL GRN (GLOVE) ×1 IMPLANT
GOWN STRL REUS W/ TWL XL LVL3 (GOWN DISPOSABLE) ×1 IMPLANT
GOWN STRL REUS W/TWL XL LVL3 (GOWN DISPOSABLE) ×1
GRASPER SUT TROCAR 14GX15 (MISCELLANEOUS) IMPLANT
IRRIG SUCT STRYKERFLOW 2 WTIP (MISCELLANEOUS)
IRRIGATION SUCT STRKRFLW 2 WTP (MISCELLANEOUS) ×1 IMPLANT
KIT BASIN OR (CUSTOM PROCEDURE TRAY) ×1 IMPLANT
KIT TURNOVER KIT A (KITS) IMPLANT
NS IRRIG 1000ML POUR BTL (IV SOLUTION) ×1 IMPLANT
PACK UNIVERSAL I (CUSTOM PROCEDURE TRAY) IMPLANT
SCISSORS LAP 5X45 EPIX DISP (ENDOMECHANICALS) ×1 IMPLANT
SET TUBE SMOKE EVAC HIGH FLOW (TUBING) ×1 IMPLANT
SHEARS HARMONIC ACE PLUS 45CM (MISCELLANEOUS) ×1 IMPLANT
SLEEVE ADV FIXATION 12X100MM (TROCAR) IMPLANT
SLEEVE ADV FIXATION 5X100MM (TROCAR) ×2 IMPLANT
SPIKE FLUID TRANSFER (MISCELLANEOUS) ×1 IMPLANT
STRIP CLOSURE SKIN 1/2X4 (GAUZE/BANDAGES/DRESSINGS) IMPLANT
SUT ETHIBOND 2 0 SH (SUTURE)
SUT ETHIBOND 2 0 SH 36X2 (SUTURE) IMPLANT
SUT MNCRL AB 4-0 PS2 18 (SUTURE) ×1 IMPLANT
SUT SURGIDAC NAB ES-9 0 48 120 (SUTURE) ×1 IMPLANT
SUT VICRYL 0 UR6 27IN ABS (SUTURE) IMPLANT
SYR 20ML ECCENTRIC (SYRINGE) ×1 IMPLANT
TIP INNERVISION DETACH 40FR (MISCELLANEOUS) IMPLANT
TIP INNERVISION DETACH 50FR (MISCELLANEOUS) IMPLANT
TIP INNERVISION DETACH 56FR (MISCELLANEOUS) IMPLANT
TOWEL OR 17X26 10 PK STRL BLUE (TOWEL DISPOSABLE) ×1 IMPLANT
TOWEL OR NON WOVEN STRL DISP B (DISPOSABLE) IMPLANT
TRAY FOLEY MTR SLVR 16FR STAT (SET/KITS/TRAYS/PACK) ×1 IMPLANT
TRAY LAPAROSCOPIC (CUSTOM PROCEDURE TRAY) ×1 IMPLANT
TROCAR ADV FIXATION 12X100MM (TROCAR) ×1 IMPLANT
TROCAR ADV FIXATION 5X100MM (TROCAR) ×1 IMPLANT
TROCAR XCEL NON-BLD 5MMX100MML (ENDOMECHANICALS) ×1 IMPLANT

## 2022-06-17 NOTE — Transfer of Care (Signed)
Immediate Anesthesia Transfer of Care Note  Patient: Nathaniel Gill  Procedure(s) Performed: LAPAROSCOPIC REPAIR OF HIATAL HERNIA WITH TIF ESOPHAGOGASTRODUODENOSCOPY (EGD) TRANSORAL INCISIONLESS FUNDOPLICATION  Patient Location: PACU  Anesthesia Type:General  Level of Consciousness: sedated, patient cooperative, and responds to stimulation  Airway & Oxygen Therapy: Patient Spontanous Breathing and Patient connected to face mask oxygen  Post-op Assessment: Report given to RN and Post -op Vital signs reviewed and stable  Post vital signs: Reviewed and stable  Last Vitals:  Vitals Value Taken Time  BP 154/82 06/17/22 1605  Temp    Pulse 72 06/17/22 1608  Resp 24 06/17/22 1608  SpO2 99 % 06/17/22 1608  Vitals shown include unvalidated device data.  Last Pain:  Vitals:   06/17/22 1238  TempSrc:   PainSc: 0-No pain         Complications: No notable events documented.

## 2022-06-17 NOTE — H&P (Signed)
History and Physical    Patient: Nathaniel Gill QIO:[id number] DOB: 09-05-1950 DOA: 06/17/2022 DOS: the patient was seen and examined on 06/17/2022 PCP: Unk Pinto, MD  Patient coming from: Home  Chief Complaint: Postop from hiatal hernia repair.  HPI: Nathaniel Gill is a 71 y.o. male with medical history significant of osteoarthritis, CAD, RBBB, bilateral cochlear implants, lumbar DDD, ED, esophageal stricture, gastroparesis, GERD, hiatal hernia, IBS, colon polyps, hyperlipidemia, hypertension, vertigo, vitamin D deficiency who underwent an EGD and fundoplication for sliding hiatal hernia with Dr. Redmond Pulling and Dr. Bryan Lemma.  We are admitting her to our service for 24-hour postprocedure observation.  He complains of epigastric and right shoulder pain postprocedure, but He denied fever, chills, rhinorrhea, sore throat, wheezing or hemoptysis.  No chest pain, palpitations, diaphoresis, PND, orthopnea or pitting edema of the lower extremities.  No diarrhea, constipation, melena or hematochezia.  No flank pain, dysuria, frequency or hematuria.  No polyuria, polydipsia, polyphagia or blurred vision.   CMP last week showed a calcium level of 10.6 mg/dL.  His CBC was normal.   Review of Systems: As mentioned in the history of present illness. All other systems reviewed and are negative. Past Medical History:  Diagnosis Date   Arthritis    CAD (coronary artery disease) 07/13/2021   Cor Ca++ Score 837.  Mixed mod plaque prox RCA(50 to 69%).  Mild Ca++ plaque mid RCA (25-49%).  Minimal Ca++ plaque prox LCx (0-24%).  Mid LCx CA++ plaque (50 to 69%), Prox OM1 CA++ plaque (50-69%).  Prox LAD mild to 0-24%. LM  mild 0-24% Ca++  plaque. => CT FFR + for distal LCx (small caliber)  Negative findings in the LAD, OM1 and  and RCA. =>  Would not recommend catheterization.   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 Hilarie Fredrickson:  Low-grade of narrowing Schatzki ring. Dilated to 18 mm with balloon. - 3 cm hiatal hernia. - Normal stomach. 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    Gastroparesis    GERD (gastroesophageal reflux disease)    Hiatal hernia    History of colon polyps    Hyperlipidemia    Hypertension    No sign of renal artery stenosis   IBS (irritable bowel syndrome)    Pre-diabetes    RBBB    Vertigo    Vitamin D deficiency    Past Surgical History:  Procedure Laterality Date   ABDOMINAL SURGERY  1956   Related to bleeding episode.    BIOPSY  01/27/2020   Procedure: BIOPSY;  Surgeon: Rush Landmark Telford Nab., MD;  Location: [place];  Service: Gastroenterology;;   BIOPSY  01/21/2021   Procedure: BIOPSY;  Surgeon: Irving Copas., MD;  Location: [place];  Service: Gastroenterology;;   CARDIAC CATHETERIZATION  2005   no intervention per patient   CARPAL TUNNEL RELEASE Left 03/06/2020   Dr. Amedeo Plenty   COCHLEAR IMPLANT Bilateral    COLONOSCOPY     several - Last one 11/22/19   CYST REMOVAL HAND Left 1975   [place]   ENDOSCOPIC MUCOSAL RESECTION N/A 01/27/2020   Procedure: ENDOSCOPIC MUCOSAL RESECTION;  Surgeon: Irving Copas., MD;  Location: [place];  Service: Gastroenterology;  Laterality: N/A;   ENTEROSCOPY N/A 01/27/2020   Procedure: ENTEROSCOPY;  Surgeon: Rush Landmark,  Telford Nab., MD;  Location: [facility] END[facility]COPY;  Service: Gastroenterology;  Laterality: N/A;   ESOPHAGEAL MANOMETRY N/A 05/18/2022   Procedure: ESOPHAGEAL MANOMETRY (EM);  Surgeon: Jerene Bears, MD;  [facility];  Service: Gastroenterology;  Laterality: N/A;   ESOPHAGOGASTRODUODENOSCOPY (EGD) WITH PROPOFOL N/A 01/21/2021   Procedure: ESOPHAGOGASTRODUODENOSCOPY (EGD) WITH PROPOFOL;  Surgeon: Rush Landmark Telford Nab., MD;  Location: [place];  Service:  Gastroenterology;  Laterality: N/A;   HEMOSTASIS CLIP PLACEMENT  01/27/2020   Procedure: HEMOSTASIS CLIP PLACEMENT;  Surgeon: Rush Landmark Telford Nab., MD;  Location: [place];  Service: Gastroenterology;;   SKIN LESION EXCISION     STAPEDECTOMY Bilateral    x 2 - 10 yrs apart - No MRIs per patient.   SUBMUCOSAL LIFTING INJECTION  01/27/2020   Procedure: SUBMUCOSAL LIFTING INJECTION;  Surgeon: Rush Landmark Telford Nab., MD;  Location: [place];  Service: Gastroenterology;;   SUBMUCOSAL TATTOO INJECTION  01/27/2020   Procedure: SUBMUCOSAL TATTOO INJECTION;  Surgeon: Irving Copas., MD;  Location: [place];  Service: Gastroenterology;;   TRANSTHORACIC ECHOCARDIOGRAM  07/2017   Normal LV size and function.  EF 55-60%.  No RWMA.  Basal septal hypertrophy, but no sign of significant hypertensive heart disease.  Only GR 1 DD.   TRIGGER FINGER RELEASE Right 2014   Thumb   UPPER GI ENDOSCOPY     several - last one 11/22/2019   WISDOM TOOTH EXTRACTION     Social History:  reports that he has never smoked. He has never used smokeless tobacco. He reports that he does not currently use alcohol. He reports that he does not use drugs.  Allergies  Allergen Reactions   Other     Pt has cochlear implants- Cannot have MRI due to one of cochlear implants is metal.     Family History  Problem Relation Age of Onset   Heart disease Mother        Atrial flutter   COPD Mother    Heart failure Mother    Kidney disease Father    Arrhythmia Brother    Heart attack Brother    Hypertension Brother    Hyperlipidemia Brother    Hypertension Brother    Hyperlipidemia Brother    Esophageal cancer Neg Hx    Colon cancer Neg Hx     Prior to Admission medications   Medication Sig Start Date End Date Taking? Authorizing Provider  amLODipine (NORVASC) 10 MG tablet TAKE ONE TABLET BY MOUTH DAILY FOR BLOOD PRESSURE 09/17/21  Yes Liane Comber, NP  Ascorbic Acid (VITAMIN C) 1000 MG tablet Take  1,000 mg by mouth in the morning and at bedtime.   Yes [provider]  aspirin 81 MG tablet Take 81 mg by mouth daily.   Yes [provider]  BIOTIN PO Take 1 tablet by mouth daily.   Yes [provider]  carvedilol (COREG) 12.5 MG tablet TAKE ONE TABLET BY MOUTH TWICE A DAY 04/25/22  Yes Leonie Man, MD  ezetimibe (ZETIA) 10 MG tablet TAKE ONE TABLET BY MOUTH DAILY 12/09/21  Yes Alycia Rossetti, NP  gabapentin (NEURONTIN) 300 MG capsule TAKE ONE CAPSULE BY MOUTH THREE TIMES A DAY AS NEEDED 06/09/22  Yes Alycia Rossetti, NP  Magnesium 400 MG TABS Take 400 mg by mouth daily.   Yes [provider]  olmesartan-hydrochlorothiazide (BENICAR HCT) 40-25 MG tablet TAKE ONE TABLET BY MOUTH DAILY FOR BLOOD PRESSURE 09/17/21  Yes Liane Comber, NP  pantoprazole (PROTONIX) 40 MG tablet Take 1  tablet (40 mg total) by mouth 2 (two) times daily before a meal. Take 30 minutes before breakfast and 30 minutes before dinner every day. 02/15/22  Yes Levin Erp, PA  rosuvastatin (CRESTOR) 40 MG tablet TAKE ONE TABLET BY MOUTH DAILY FOR CHOLESTEROL 06/07/22  Yes Alycia Rossetti, NP  Thiamine HCl (VITAMIN B-1) 250 MG tablet Take 250 mg by mouth daily.   Yes [provider]  vitamin E 200 UNIT capsule Take 200 Units by mouth 2 (two) times daily.   Yes [provider]  famotidine (PEPCID) 20 MG tablet TAKE 1 TABLET BY MOUTH DAILY AS NEEDED FOR HEARTBURN OR INDIGESTION 04/18/22   Levin Erp, [place]    Physical Exam: Vitals:   06/17/22 1155 06/17/22 1238  BP: (!) 158/89   Pulse: 64   Resp: 16   Temp: 98 F (36.7 C)   TempSrc: Oral   SpO2: 97%   Weight:  102.5 kg  Height:  '6\' 1"'$  (1.854 m)   Physical Exam Vitals and nursing note reviewed.  Constitutional:      General: He is awake. He is not in acute distress.    Appearance: Normal appearance.  HENT:     Head: Normocephalic.     Mouth/Throat:     Mouth: Mucous membranes are  moist.  Eyes:     General: No scleral icterus.    Pupils: Pupils are equal, round, and reactive to light.  Cardiovascular:     Rate and Rhythm: Normal rate and regular rhythm.     Heart sounds: S1 normal and S2 normal.  Pulmonary:     Effort: Pulmonary effort is normal.     Breath sounds: Normal breath sounds.  Abdominal:     General: Bowel sounds are normal. There is no distension.     Palpations: Abdomen is soft.     Tenderness: There is abdominal tenderness in the epigastric area. There is no guarding or rebound.  Musculoskeletal:     Cervical back: Neck supple.     Right lower leg: No edema.     Left lower leg: No edema.  Skin:    General: Skin is warm and dry.  Neurological:     General: No focal deficit present.     Mental Status: He is alert and oriented to person, place, and time.  Psychiatric:        Mood and Affect: Mood normal.        Behavior: Behavior normal. Behavior is cooperative.     Data Reviewed:  There are no new results to review at this time.  Assessment and Plan: Active problems:   Hiatal hernia with GERD   Peptic stricture of esophagus   GERD without esophagitis Observation/MedSurg. Analgesics as needed. Antiemetics as needed. Continue PPI. Continue dexamethasone per GI orders. Continue metoclopramide 10 mg IVP every 6 hours.  Active Problems:   Resistant hypertension Resume carvedilol 12.5 mg p.o. twice daily tonight. Continue amlodipine 10 mg p.o. in the morning. Resume Benicar HCT in the morning. Monitor BP, heart rate, renal function electrolytes    Hyperlipidemia associated with type 2 diabetes mellitus ([place]) Hold statin for now.    Type 2 diabetes mellitus ([place]) On clear liquid diet. CBG monitoring before meals and bedtime. Regular insulin sliding scale while on dexamethasone.    Hypercalcemia Recheck calcium level in AM.     Advance Care Planning:   Code Status: Full code.  Consults:   Family Communication:    Severity of Illness:  The appropriate patient status for this patient is OBSERVATION. Observation status is judged to be reasonable and necessary in order to provide the required intensity of service to ensure the patient's safety. The patient's presenting symptoms, physical exam findings, and initial radiographic and laboratory data in the context of their medical condition is felt to place them at decreased risk for further clinical deterioration. Furthermore, it is anticipated that the patient will be medically stable for discharge from the hospital within 2 midnights of admission.   Author: Reubin Milan, MD 06/17/2022 4:10 PM  For on call review [email or web address].   This document was prepared using Dragon voice recognition software and may contain some unintended transcription errors.

## 2022-06-17 NOTE — Progress Notes (Signed)
  Transition of Care [facility]) Screening Note   Patient Details  Name: Nathaniel Gill Date of Birth: [birth year]   Transition of Care [facility]) CM/SW Contact:    Henrietta Dine, RN Phone Number: [phone] 06/17/2022, 5:58 PM    Transition of Care Department [facility]) has reviewed patient and no TOC needs have been identified at this time. We will continue to monitor patient advancement through interdisciplinary progression rounds. If new patient transition needs arise, please place a TOC consult.

## 2022-06-17 NOTE — Anesthesia Postprocedure Evaluation (Signed)
Anesthesia Post Note  Patient: Nathaniel Gill  Procedure(s) Performed: LAPAROSCOPIC REPAIR OF HIATAL HERNIA WITH TIF ESOPHAGOGASTRODUODENOSCOPY (EGD) TRANSORAL INCISIONLESS FUNDOPLICATION     Patient location during evaluation: PACU Anesthesia Type: General Level of consciousness: awake and alert, oriented and patient cooperative Pain management: pain level controlled Vital Signs Assessment: post-procedure vital signs reviewed and stable Respiratory status: spontaneous breathing, nonlabored ventilation and respiratory function stable Cardiovascular status: blood pressure returned to baseline and stable Postop Assessment: no apparent nausea or vomiting Anesthetic complications: no   No notable events documented.  Last Vitals:  Vitals:   06/17/22 1155 06/17/22 1603  BP: (!) 158/89 (!) 154/82  Pulse: 64 79  Resp: 16 12  Temp: 36.7 C 36.6 C  SpO2: 97% 97%    Last Pain:  Vitals:   06/17/22 1238  TempSrc:   PainSc: 0-No pain                 Pervis Hocking

## 2022-06-17 NOTE — Op Note (Signed)
[facility] Patient Name: Nathaniel Gill Procedure Date: 06/17/2022 MRN: [id number] Attending MD: Gerrit Heck , MD, [id number] Date of Birth: 1950/10/25 CSN: [id number] Age: 72 Admit Type: Inpatient Procedure:                Upper GI endoscopy w/ balloon dilation and TIF Indications:              For therapy of esophageal reflux, , For therapy of                            hiatal hernia                           71 year old male with longstanding history of                            reflux complicated by peptic stricture and small                            hiatal hernia, presents today for concomitant                            laparoscopic hiatal hernia repair and TIF. Providers:                Gerrit Heck, MD, Luan Moore, Technician,                            Darliss Cheney, Technician Referring MD:              Medicines:                General Anesthesia Complications:            No immediate complications. Estimated Blood Loss:     Estimated blood loss was minimal. Procedure:                Pre-Anesthesia Assessment:                           - Prior to the procedure, a History and Physical                            was performed, and patient medications and                            allergies were reviewed. The patient's tolerance of                            previous anesthesia was also reviewed. The risks                            and benefits of the procedure and the sedation                            options and risks were discussed with the patient.  All questions were answered, and informed consent                            was obtained. Prior Anticoagulants: The patient has                            taken no anticoagulant or antiplatelet agents                            except for aspirin. ASA Grade Assessment: II - A                            patient with mild systemic disease. After reviewing                             the risks and benefits, the patient was deemed in                            satisfactory condition to undergo the procedure.                           After obtaining informed consent, the endoscope was                            passed under direct vision. Throughout the                            procedure, the patient's blood pressure, pulse, and                            oxygen saturations were monitored continuously. The                            GIF-H190 ([id number]) Olympus endoscope was introduced                            through the mouth, and advanced to the second part                            of duodenum. The upper GI endoscopy was                            accomplished without difficulty. The patient                            tolerated the procedure well. Scope In: Scope Out: Findings:      One benign-appearing, intrinsic mild stenosis was found 41 cm from the       incisors. This stenosis measured 1 cm (in length). The stenosis was       traversed. A TTS dilator was passed through the scope. Dilation with an       18-19-20 mm balloon dilator was performed to 19 mm. The dilation site       was examined and showed mild, appropriate mucosal  disruption and       moderate improvement in luminal narrowing. Estimated blood loss was       minimal.      The Z-line was regular and was found 42 cm from the incisors. The       decision was made to perform transoral fundoplication with the EsophyX       Z+ system. Before the procedure, the gastroesophageal flap valve was       classified as Hill Grade II (fold present, opens with respiration). The       endoscope was withdrawn, placed through the plication device, reinserted       into the patient and advanced past the level of the GE junction at 42 cm       from the incisors and into the stomach. Next, the endoscope was advanced       beyond the device and retroflexed. The first plication site was       identified at  the 1 o'clock position. With the device in the proper       position, the helical retractor was deployed and tissue was pulled into       the mold before it was closed. The device was rotated, suction was       applied using the invaginator, then the device was advanced slightly and       two H-shaped fasteners were placed. The device was reloaded and the       process repeated in order to deploy a total of ten fasteners at the       first site. The device was then rotated to the 11 o'clock position after       which the helical retractor was used to grasp additional tissue within       the mold before rotation and deployment of a total of ten fasteners at       the second site. To complete reconstruction of the valve, additional       fasteners were deployed at the following sites: four fasteners at 5       o'clock and four fasteners at 7 o'clock positions. In total, 28       fasteners contributed to create a valve measuring 3.5 cm in length which       involved 270 degrees of the circumference upon retroflexed view. The       EsophyX device and endoscope were then removed. Relook endoscopy was       performed prior to the conclusion of the case to confirm the above       findings. Estimated blood loss was minimal.      The entire examined stomach was normal.      Lymphangiectasia was present in the second portion of the duodenum.      A tattoo was seen in the second portion of the duodenum. Impression:               - Benign-appearing esophageal stenosis. Dilated                            with 19 mm TTS balloon with appropriate mucosal                            rent formation.                           -  Z-line regular, 42 cm from the incisors.                           - Normal stomach.                           - Duodenal mucosal lymphangiectasia.                           - A tattoo was seen in the duodenum.                           - EsophyX transoral fundoplication was  performed.                           - No specimens collected. Moderate Sedation:      Not Applicable - Patient had care per Anesthesia. Recommendation:           -Admit to the Hospitalist service on the surgical                            ward for overnight observation with anticipated                            discharge tomorrow                           -Zofran 4 mg IV every 6 hours x24 hours, then prn                           -Reglan 10 mg every 6 hours x24 hours, then prn                           -Resume scopolamine patch x3 days (applied preop)                           -Protonix 40 mg p.o. BID x2 weeks, then 40 mg daily                            x2 weeks, then 20 mg daily x1 week then prn                           -Decadron 8 mg every 6 hours times max 5 doses                           -Gas-X (simethicone) 4225 mg p.o. prn every 6 hours                            gas pain, abdominal discomfort                           -Tylenol 3 (APAP 120 mg/codeine 12 mg per 5 mL): 15  mL's every 4 hours prn pain                           -Viscous lidocaine 2% every 6 hours as needed for                            odynophagia, sore throat.                           -Colace 100 mg p.o. twice daily if taking pain                            medications                           -Clear liquid diet okay overnight                           -Okay to ambulate with assist around the ward                           -No chemical DVT prophylaxis in the first 24 hours.                            SCDs and ambulation instead.                           -Ok to resume ASA 81 mg in 3 days.                           -Please do not hesitate to contact me directly with                            any postoperative questions or concerns Procedure Code(s):        --- Professional ---                           [phone], Esophagogastroduodenoscopy, flexible,                            transoral;  with transendoscopic balloon dilation of                            esophagus (less than 30 mm diameter) Diagnosis Code(s):        --- Professional ---                           K22.2, Esophageal obstruction                           I89.0, Lymphedema, not elsewhere classified                           K21.9, Gastro-esophageal reflux disease without  esophagitis                           K44.9, Diaphragmatic hernia without obstruction or                            gangrene CPT copyright 2022 American Medical Association. All rights reserved. The codes documented in this report are preliminary and upon coder review may  be revised to meet current compliance requirements. Gerrit Heck, MD 06/17/2022 4:14:49 PM Number of Addenda: 0

## 2022-06-17 NOTE — Anesthesia Procedure Notes (Signed)
Procedure Name: Intubation Date/Time: 06/17/2022 1:31 PM  Performed by: Eben Burow, CRNAPre-anesthesia Checklist: Patient identified, Emergency Drugs available, Suction available, Patient being monitored and Timeout performed Patient Re-evaluated:Patient Re-evaluated prior to induction Oxygen Delivery Method: Circle system utilized Preoxygenation: Pre-oxygenation with 100% oxygen Induction Type: IV induction Ventilation: Mask ventilation without difficulty Laryngoscope Size: Glidescope and 4 Tube type: Oral Tube size: 7.5 mm Number of attempts: 2 Airway Equipment and Method: Stylet Placement Confirmation: ETT inserted through vocal cords under direct vision, positive ETCO2 and breath sounds checked- equal and bilateral Secured at: 23 cm Tube secured with: Tape Dental Injury: Teeth and Oropharynx as per pre-operative assessment  Comments: DVL x 1 with Mac 4 with view of arytenoids only, Glidescope x1 with grade one view of cords, intubation +/= BBS, +EtCO2.

## 2022-06-17 NOTE — Interval H&P Note (Signed)
History and Physical Interval Note:  06/17/2022 12:28 PM  Gen Clagg  has presented today for surgery, with the diagnosis of SLIDING HIATAL HERNIA.  The various methods of treatment have been discussed with the patient and family. After consideration of risks, benefits and other options for treatment, the patient has consented to  Procedure(s): LAPAROSCOPIC REPAIR OF HIATAL HERNIA WITH TIF (N/A) ESOPHAGOGASTRODUODENOSCOPY (EGD) (N/A) TRANSORAL INCISIONLESS FUNDOPLICATION (N/A) as a surgical intervention.  The patient's history has been reviewed, patient examined, no change in status, stable for surgery.  I have reviewed the patient's chart and labs.  Questions were answered to the patient's satisfaction.    Discussed that I'm out of town this weekend and my partners will be rounding on him.  Discussed the general postoperative course and expectations again.  Leighton Ruff. Redmond Pulling, MD, [place], Bariatric, & Minimally Invasive Surgery [facility] Surgery,  [place]   [place]

## 2022-06-17 NOTE — H&P (Signed)
REFERRING PHYSICIAN: Pyrtle, Frances Maywood, MD  PROVIDER: Jolyn Deshmukh Leanne Chang, MD  MRN: [id number] DOB: 12/26/1950 DATE OF ENCOUNTER: 06/08/2022  Subjective   Chief Complaint: New Consultation ( hernia)   History of Present Illness: Nathaniel Gill is a 71 y.o. male who is seen today as an office consultation at the request of Dr. Hilarie Fredrickson for evaluation of New Consultation ( hernia) .  He states that he has problems with acid reflux. He states that his symptoms are pretty well-controlled with his current regimen of pantoprazole and famotidine. He states prior to going on the Protonix he would wake up at night with an acid sensation in his mouth as well as acid in his mouth and have to throw it up a little bit. He would have to wash out his mouth he states. No vomiting of food. He states he has had to have 2 dilations in the past. He states he has an occasional stuck sensation with rice and pasta. He will still have breakthrough symptoms of acid if he eats something tomato like spaghetti. He does have some occasional issues with belching and gas.  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 He states he has a central bulge in his upper abdomen. No abdominal pain. No diarrhea or constipation. Saw gastroenterology to discuss a transoral incision less fundoplication and has a hiatal hernia which would require repair so he was referred here  He has had upper endoscopies, esophagram, and manometry  Reportedly his manometry from November is normal  He had been on Ozempic and had bad side effects with bloating terrible gas and ended up actually in the ER. Since stopping it those symptoms have resolved.  He states that occasionally he will have sneezing fits if the temperature rapidly changes  Endoscopic History: - EGD (11/22/2019): Mild nonobstructing ring in GEJ dilated with 18 mm TTS balloon, 3 cm HH, Hill grade 4 valve. 10 mm polyp in D2 - Small bowel enteroscopy  (01/27/2020): Nonobstructing ring at GE junction, 3 cm HH. Moderate antral gastritis (path benign). Duodenal lymphangiectasia. 18 mm semisessile polyp at D2/D3 removed via piecemeal resection with left snare (path: Duodenal adenoma), then the area superior to the polyp was tattooed with spot - EGD (01/23/2021): Nonobstructing ring, 5 cm HH, duodenal scar in the second/third portion of the duodenum adenoma resection (path: Benign small bowel mucosa). Recommended repeat EGD in 2 years for continued adenoma surveillance - EGD (05/13/2022): Low-grade peptic stricture dilated with 18 mm TTS balloon, 4 cm HH, Hill grade 4 valve. Normal stomach. Tattoo D2. Polypoid mucosa in D2 (path: Benign), Benign duodenal lymphangiectasia    Review of Systems: A complete review of systems was obtained from the patient. I have reviewed this information and discussed as appropriate with the patient. See HPI as well for other ROS.  ROS   Medical History: Past Medical History:  Diagnosis Date  Arthritis  CAD (coronary artery disease)  DDD (degenerative disc disease), cervical  ED (erectile dysfunction)  GERD (gastroesophageal reflux disease)  Hypertension  IBS (irritable bowel syndrome)  Vitamin D deficiency   There is no problem list on file for this patient.  Past Surgical History:  Procedure Laterality Date  Cardiac catheterization  ENDOSCOPIC CARPAL TUNNEL RELEASE Left  sophagogastroduodenoscopy  STAPEDECTOMY W/REESTABLISHMENT  Wisdom tooth extraction    No Known Allergies  Current Outpatient Medications on File Prior to Visit  Medication Sig Dispense Refill  amLODIPine (NORVASC) 10 MG tablet Take 1 tablet by mouth  once daily  aspirin 81 MG chewable tablet Take by mouth  carvediloL (COREG) 12.5 MG tablet Take 1 tablet by mouth 2 (two) times daily  ezetimibe (ZETIA) 10 mg tablet Take 1 tablet by mouth once daily  famotidine (PEPCID) 20 MG tablet TAKE 1 TABLET BY MOUTH DAILY AS NEEDED FOR HEARTBURN  OR INDIGESTION  olmesartan-hydroCHLOROthiazide (BENICAR HCT) 40-25 mg tablet Take 1 tablet by mouth once daily  rosuvastatin (CRESTOR) 40 MG tablet Take 1 tablet by mouth once daily   No current facility-administered medications on file prior to visit.   Family History  Problem Relation Age of Onset  High blood pressure (Hypertension) Mother  Hyperlipidemia (Elevated cholesterol) Brother  High blood pressure (Hypertension) Brother    Social History   Tobacco Use  Smoking Status Never  Smokeless Tobacco Never    Social History   Socioeconomic History  Marital status: Married  Tobacco Use  Smoking status: Never  Smokeless tobacco: Never  Substance and Sexual Activity  Alcohol use: Not Currently   Objective:   Vitals:  06/08/22 1047  BP: 114/70  Pulse: 66  Temp: 36.4 C (97.6 F)  SpO2: 99%  Weight: (!) 102.2 kg (225 lb 6.4 oz)  Height: 182.9 cm (6')   Body mass index is 30.57 kg/m.  Constitutional: NAD; conversant; no deformities Eyes: Moist conjunctiva; no lid lag; anicteric; PERRL Neck: Trachea midline; no thyromegaly Lungs: Normal respiratory effort; no tactile fremitus CV: RRR; no palpable thrills; no pitting edema GI: Abd soft, nontender, nondistended; old right vertical paramedian incision, upper midline diastases; no palpable hepatosplenomegaly MSK: Normal gait; no clubbing/cyanosis Psychiatric: Appropriate affect; alert and oriented x3 Lymphatic: No palpable cervical or axillary lymphadenopathy Skin: No rash, lesions or jaundice  Labs, Imaging and Diagnostic Testing: [place] 05/11/22 MPRESSION: 1. Patent cystic and common bile ducts.  2. Normal gallbladder ejection fraction.  Ct ap 04/17/22 IMPRESSION: 1. No evidence of acute abnormality. 2. Equivocal tiny gallstones - no CT evidence of acute cholecystitis. 3. Colonic diverticulosis without evidence of acute diverticulitis. 4. Small inguinal hernias containing fat. 5. Aortic Atherosclerosis  (ICD10-I70.0).  Narrative & Impression  CLINICAL DATA: Dysphagia  EXAM: ESOPHOGRAM / BARIUM SWALLOW / BARIUM TABLET STUDY 09/03/19  TECHNIQUE: Combined double contrast and single contrast examination performed using effervescent crystals, thick barium liquid, and thin barium liquid. The patient was observed with fluoroscopy swallowing a 13 mm barium sulphate tablet.  FLUOROSCOPY TIME: Fluoroscopy Time: 2 minutes 24 seconds  Radiation Exposure Index (if provided by the fluoroscopic device): 47.1 mGy  Number of Acquired Spot Images: 0  COMPARISON: None.  FINDINGS: Fluoroscopic evaluation of swallowing demonstrates normal esophageal peristalsis. There is a small hiatal hernia. Distal esophageal mucosal ring noted. The 13 mm barium tablet briefly sticks in this area and reproduces the patient's symptoms.  No fold thickening. No reflux with the water siphon maneuver.  IMPRESSION: Small hiatal hernia.  Moderate distal esophageal mucosal ring which causes brief sticking of the 13 mm barium tablet and reproduces the patient's symptoms.   Cardiac clearance note 06/07/2022  GI office note June 03, 2022  ER visit April 17, 2022  Assessment and Plan:    Diagnoses and all orders for this visit:  Hiatal hernia  GERD with stricture  History of abdominal surgery  Diastasis of muscle    We reviewed his workup. First he does not have a ventral hernia. He has a diastases in the upper midline which I recommended no intervention  We reviewed the anatomy of the foregut. We  discussed reflux. He was given Air traffic controller. We discussed hiatal hernia. We discussed his upper endoscopy and his esophagram and the results of his manometry that we have preliminarily.  I think he would be a good candidate for a combination of laparoscopic hiatal hernia repair with concomitant TIF procedure by gastroenterology  His reflux symptoms are fairly well-controlled which would just good  outcome with reflux surgery  Discussed gaining access to the abdomen laparoscopically. We discussed that he may have some scar tissue from his prior laparotomy that we may have to deal with so theoretically he has slight increased risk for enterotomy but I think it is low. We discussed mobilizing the proximal stomach and esophagus. We discussed closure of the diaphragm. We then briefly discussed the different types of classical surgical wraps and how they differed from a TIF wrap. We discussed the pros and cons of each. We discussed the typical hospitalization. We discussed the typical recovery. We discussed the diet transition over the course of 6 to 8 weeks. We discussed the typical quirks that can be seen after this type of surgery. We discussed the possibility of ongoing need for reflux medication. We discussed potential dysphagia.  We discussed risk of surgery include not limited to bleeding, infection, injury to surrounding structures such as the lungs, aorta, vagus nerve, abdominal viscera; cardiac and pulmonary events, blood clot formation, hiatal hernia recurrence, gas bloat, persistent reflux, need for additional procedures.  All of his questions were asked and answered. He would like to proceed with surgery  This patient encounter took 47 minutes today to perform the following: take history, perform exam, review outside records, interpret imaging, counsel the patient on their diagnosis and document encounter, findings & plan in the EHR  No follow-ups on file.  Lewis Grivas Leanne Chang, MD  General, Minimally Invasive, & Bariatric Surgery

## 2022-06-17 NOTE — Discharge Instructions (Signed)
[facility] LAPAROSCOPIC SURGERY: POST OP INSTRUCTIONS Always review your discharge instruction sheet given to you by the facility where your surgery was performed. IF YOU HAVE DISABILITY OR FAMILY LEAVE FORMS, YOU MUST BRING THEM TO THE OFFICE FOR PROCESSING.   DO NOT GIVE THEM TO YOUR DOCTOR.  PAIN CONTROL  First take acetaminophen (Tylenol) AND/or ibuprofen (Advil) to control your pain after surgery.  Follow directions on package.  Taking acetaminophen (Tylenol) and/or ibuprofen (Advil) regularly after surgery will help to control your pain and lower the amount of prescription pain medication you may need.  You should not take more than 3,000 mg (3 grams) of acetaminophen (Tylenol) in 24 hours.  You should not take ibuprofen (Advil), aleve, motrin, naprosyn or other NSAIDS if you have a history of stomach ulcers or chronic kidney disease.  A prescription for pain medication may be given to you upon discharge.  Take your pain medication as prescribed, if you still have uncontrolled pain after taking acetaminophen (Tylenol) or ibuprofen (Advil). Use ice packs to help control pain. If you need a refill on your pain medication, please contact your pharmacy.  They will contact our office to request authorization. Prescriptions will not be filled after 5pm or on week-ends.  HOME MEDICATIONS Take your usually prescribed medications unless otherwise directed.  DIET Follow diet plan from Dr Bryan Lemma   CONSTIPATION It is common to experience some constipation after surgery and if you are taking pain medication.  Increasing fluid intake and taking a stool softener (such as Colace) will usually help or prevent this problem from occurring.  A mild laxative (Milk of Magnesia or Miralax) should be taken according to package instructions if there are no bowel movements after 48 hours.  WOUND/INCISION CARE Most patients will experience some swelling and bruising in the area of the  incisions.  Ice packs will help.  Swelling and bruising can take several days to resolve.  Unless discharge instructions indicate otherwise, follow guidelines below  STERI-STRIPS - you may remove your outer bandages 48 hours after surgery, and you may shower at that time.  You have steri-strips (small skin tapes) in place directly over the incision.  These strips should be left on the skin for 7-10 days.   DERMABOND/SKIN GLUE - you may shower in 24 hours.  The glue will flake off over the next 2-3 weeks. Any sutures or staples will be removed at the office during your follow-up visit.  ACTIVITIES You may resume regular (light) daily activities beginning the next day--such as daily self-care, walking, climbing stairs--gradually increasing activities as tolerated.  You may have sexual intercourse when it is comfortable.  Refrain from any heavy lifting or straining until approved by your doctor. You may drive when you are no longer taking prescription pain medication, you can comfortably wear a seatbelt, and you can safely maneuver your car and apply brakes.  FOLLOW-UP You should see your doctor in the office for a follow-up appointment approximately 2-3 weeks after your surgery.  You should have been given your post-op/follow-up appointment when your surgery was scheduled.  If you did not receive a post-op/follow-up appointment, make sure that you call for this appointment within a day or two after you arrive home to insure a convenient appointment time.  OTHER INSTRUCTIONS   WHEN TO CALL YOUR DOCTOR: Fever over 101.0 Inability to urinate Continued bleeding from incision. Increased pain, redness, or drainage from the incision. Increasing abdominal pain  The clinic staff is available  to answer your questions during regular business hours.  Please don't hesitate to call and ask to speak to one of the nurses for clinical concerns.  If you have a medical emergency, go to the nearest emergency room  or call 911.  A surgeon from [facility] is always on call at the hospital. [address] [place], [place], [place]  [address] ? [address], [place], [place]   [phone] ? [phone] ? FAX [phone] Web site: [email or web address]

## 2022-06-17 NOTE — Op Note (Signed)
06/17/2022  2:50 PM  PATIENT:  Nathaniel Gill  71 y.o. male  PRE-OPERATIVE DIAGNOSIS:  SLIDING HIATAL HERNIA  POST-OPERATIVE DIAGNOSIS:  SLIDING HIATAL HERNIA  PROCEDURE:  Procedure(s): LAPAROSCOPIC REPAIR OF HIATAL HERNIA WITH TIF - Dr Redmond Pulling ESOPHAGOGASTRODUODENOSCOPY (EGD) - Dr Bryan Lemma TRANSORAL INCISIONLESS FUNDOPLICATION - Dr Marlynn Perking  SURGEON:  Surgeon(s): Greer Pickerel, MD Lavena Bullion, DO  ASSISTANTS: Michael Boston, MD, Sherlean Foot, RNFA   ANESTHESIA:   general  DRAINS: none   LOCAL MEDICATIONS USED:  MARCAINE    and OTHER exparel  SPECIMEN:  Source of Specimen:  esophageal lipoma  DISPOSITION OF SPECIMEN:   discarded  COUNTS:  YES  INDICATION FOR PROCEDURE: 71 year old male presents for elective repair of hiatal hernia repair along with concomitant TIF procedure.  Please see chart for additional details regarding his workup and history.  PROCEDURE:  Patient was given preoperative enhanced recovery medications including subcutaneous heparin.  He was taken the OR 1 at [facility] and placed upon on the operating room table.  His arms were tucked at the appropriate padding.  General endotracheal anesthesia was established.  Sequential compression devices were placed.  His abdomen was prepped and draped in the usual standard surgical fashion.  Patient received IV Ancef.  Surgical timeout was performed.  Access to the abdomen was obtained using the Optiview technique in the left upper quadrant at Palmer's point.  A small incision was made then using a 0 degree 5 mm laparoscope through a 5 mm trocar I advanced it through all layers of the abdominal wall and carefully entered the abdominal cavity.  Pneumoperitoneum was smoothly established up to a patient pressure of 15 mmHg.  The laparoscope was advanced and the abdominal cavity was surveilled.  There is no evidence of injury to surrounding structures.  There is no evidence of scar tissue to his anterior  abdominal wall in the right abdomen from his right paramedian incision from his prior laparotomy for many years ago.  The patient was placed in reverse Trendelenburg.  A 5 mm trocar was placed slightly above into the left of the umbilicus.  I then performed a laparoscopic tap block with a combination of Marcaine and Exparel along the right side of the abdomen for postoperative pain relief.  A 5 mm trocar was placed in the lateral right abdomen and a 11 mm trocar was placed in the right mid abdomen all under direct visualization.  I then performed a laparoscopic tap block on the left side of the abdomen for postoperative pain relief followed by placement of a 5 mm trocar in the left lateral abdominal wall.  A Nathanson liver retractor was placed through the subxiphoid position to lift up the left lobe of liver to expose the hiatus.  There is a little bit of an air leak around the [facility] liver retractor so a towel clamp was placed around it on the skin to prevent air leak.  Patient had no obvious defect of the hiatus but there is sort of a excess amount of fat tissue in the area.  We started by incising the gastrohepatic ligament with harmonic scalpel.  The right crus of the diaphragm was identified along with the vena cava.  I carefully incised the peritoneum slightly medial to the right crus of the diaphragm and got into the avascular space and then started bluntly dissecting it carefully with a Prestige grasper.  Continued mobilizing it up anteriorly along the right crus and across anteriorly with a combination of  blunt dissection with the procedure grasper and taking down attachments with the harmonic scalpel.   The posterior vagus nerve was identified and preserved.  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  The aorta was identified.  Took down lateral attachments on the right side and came across anteriorly.  The left crus was identified.  I continued mobilizing  the this avascular tissue along the left crus of the diaphragm and took down the phrenoesophageal membrane with harmonic scalpel.  I then came back on the right side and identified the confluence of the left and right crura.  I was able to place my grasper underneath the esophagus and only lifted it up to expose the posterior esophagus as well as the left side.  We then carefully mobilized the lipoma off the esophagus using a combination of gentle dissection using a Kitner as well as a Investment banker, corporate.  We were able to take down some of the attachments around the lipoma and leave it where it was just a pedunculated mass coming off the esophagus.  I amputated it leaving about half a centimeter of cuff of fat material on the esophagus.  The lipoma was removed in pieces from the larger trocar.  Posterior attachments between the esophagus and aorta were first gently spread with the procedure grasper and then taken down with harmonic scalpel.  I took down these avascular attachments between the posterior esophagus and the aorta with harmonic scalpel.    This just left some lateral attachments on the left side in the mediastinum. I made sure we were far enough away from the esophagus but not violating the parietal pleura.  We are able to take down the lateral attachments along the left side.  At no time was the hot blade of the harmonic scalpel used to spread close to the esophagus.  At this point we had achieved about 3 cm of intra-abdominal esophageal length.  I felt we had achieved adequate mobilization.  I then reapproximated the left and right crura with 3 interrupted 0 Ethibond Surgidac Endo Stitch sutures each secured with a titanium tie knot.  The diaphragm also came together without tension.  There was no tension on the esophagus there was again about 3 cm of esophagus easily within the abdominal cavity.  I did release some of the lateral attachments of the right crus with harmonic scalpel.  There was a little  bit of bleeding at that point.  Pressure was held and then released.  There is no evidence of additional bleeding.  There was a small gap left in the diaphragm intentionally to accommodate the device for the TIF portion of the procedure.  We released pneumoperitoneum for about 2 minutes and released the liver retractor from retraction and then reestablished pneumoperitoneum and went back and looked at the area of the diaphragm that had a little bit of oozing previously.  There was no evidence of additional bleeding.  It appeared hemostatic.  Additional local was infiltrated after I remove the 11 mm trocar and closed that fascial defect with an interrupted 0 Vicryl using a PMI suture passer.  I again released pneumoperitoneum for another minute or 2 and then went back in at a reduced pneumoperitoneum and reinspected the diaphragm and there was no evidence of bleeding.  The liver retractor was removed and there was no evidence of injury to the liver.  There is no evidence of bleeding.  Pneumoperitoneum was released and the skin incisions were  closed with 4-0 Monocryl in a subcuticular fashion followed by the application of Dermabond.  All needle, instrument sponge counts were correct x 2.  There were no apparent complications during this portion of the procedure.   After laparoscopic hiatal hernia repair was complete, Dr. Gerrit Heck, a gastroenterologist with endoscopic procedure expertise and trained in endoscopic fundoplication, proceeded to perform endoscopic fundoplication and he will dictate that separately.  I was present for the insertion of the endoscope by Dr. Bryan Lemma.  There was no evidence of esophageal injury intraluminal or or gastric injury intraluminal.    PLAN OF CARE: Admit for overnight observation  PATIENT DISPOSITION:   OR for TIF portion   Delay start of Pharmacological VTE agent (>24hrs) due to surgical blood loss or risk of bleeding:  may start 12/16 pm  Leighton Ruff. Redmond Pulling, MD,  FACS General, Bariatric, & Minimally Invasive Surgery [facility], [place]

## 2022-06-17 NOTE — Interval H&P Note (Signed)
History and Physical Interval Note:  06/17/2022 1:07 PM  Markeis Allman  has presented today for surgery, with the diagnosis of SLIDING HIATAL HERNIA.  The various methods of treatment have been discussed with the patient and family. After consideration of risks, benefits and other options for treatment, the patient has consented to  Procedure(s): LAPAROSCOPIC REPAIR OF HIATAL HERNIA WITH TIF (N/A) ESOPHAGOGASTRODUODENOSCOPY (EGD) (N/A) TRANSORAL INCISIONLESS FUNDOPLICATION (N/A) as a surgical intervention.  The patient's history has been reviewed, patient examined, no change in status, stable for surgery.  I have reviewed the patient's chart and labs.  Questions were answered to the patient's satisfaction.     Dominic Pea Adreena Willits

## 2022-06-17 NOTE — Anesthesia Preprocedure Evaluation (Addendum)
Anesthesia Evaluation  Patient identified by MRN, date of birth, ID band Patient awake    Reviewed: Allergy & Precautions, NPO status , Patient's Chart, lab work & pertinent test results, reviewed documented beta blocker date and time   Airway Mallampati: III  TM Distance: >3 FB Neck ROM: Full    Dental no notable dental hx. (+) Teeth Intact, Dental Advisory Given   Pulmonary neg pulmonary ROS   Pulmonary exam normal breath sounds clear to auscultation       Cardiovascular hypertension (143/78 preop, normally 110-120SBP), Pt. on medications and Pt. on home beta blockers + CAD (based on calcium score)  Normal cardiovascular exam Rhythm:Regular Rate:Normal  Echo 2019  - Left ventricle: The cavity size was normal. Systolic function was    normal. The estimated ejection fraction was in the range of 55%    to 60%. Wall motion was normal; there were no regional wall    motion abnormalities. Doppler parameters are consistent with    abnormal left ventricular relaxation (grade 1 diastolic    dysfunction).     Neuro/Psych negative neurological ROS  negative psych ROS   GI/Hepatic Neg liver ROS, hiatal hernia,GERD  Controlled and Medicated,,Hx esophageal stricture- dilated 2021 Gastroparesis    Endo/Other  diabetes (pre-diabetic)    Renal/GU Renal InsufficiencyRenal diseaseCKD 2, Cr 0.96  negative genitourinary   Musculoskeletal  (+) Arthritis , Osteoarthritis,    Abdominal   Peds  Hematology negative hematology ROS (+) Hb 13.8   Anesthesia Other Findings B/L cochlear implants  Reproductive/Obstetrics negative OB ROS                             Anesthesia Physical Anesthesia Plan  ASA: 2  Anesthesia Plan: General   Post-op Pain Management: Tylenol PO (pre-op)*   Induction: Intravenous and Rapid sequence  PONV Risk Score and Plan: 3 and Ondansetron, Dexamethasone, Midazolam and Treatment  may vary due to age or medical condition  Airway Management Planned: Oral ETT  Additional Equipment: None  Intra-op Plan:   Post-operative Plan: Extubation in OR  Informed Consent: I have reviewed the patients History and Physical, chart, labs and discussed the procedure including the risks, benefits and alternatives for the proposed anesthesia with the patient or authorized representative who has indicated his/her understanding and acceptance.     Dental advisory given  Plan Discussed with: CRNA  Anesthesia Plan Comments: (No prior airway notes, will have glidescope available)       Anesthesia Quick Evaluation

## 2022-06-18 ENCOUNTER — Encounter: Payer: Self-pay | Admitting: Gastroenterology

## 2022-06-18 ENCOUNTER — Encounter (HOSPITAL_COMMUNITY): Payer: Self-pay | Admitting: General Surgery

## 2022-06-18 ENCOUNTER — Other Ambulatory Visit: Payer: Self-pay | Admitting: Internal Medicine

## 2022-06-18 DIAGNOSIS — K222 Esophageal obstruction: Secondary | ICD-10-CM | POA: Diagnosis not present

## 2022-06-18 DIAGNOSIS — K449 Diaphragmatic hernia without obstruction or gangrene: Secondary | ICD-10-CM | POA: Diagnosis not present

## 2022-06-18 DIAGNOSIS — Z9889 Other specified postprocedural states: Secondary | ICD-10-CM

## 2022-06-18 DIAGNOSIS — I2510 Atherosclerotic heart disease of native coronary artery without angina pectoris: Secondary | ICD-10-CM | POA: Diagnosis not present

## 2022-06-18 DIAGNOSIS — I890 Lymphedema, not elsewhere classified: Secondary | ICD-10-CM | POA: Diagnosis not present

## 2022-06-18 DIAGNOSIS — I10 Essential (primary) hypertension: Secondary | ICD-10-CM | POA: Diagnosis not present

## 2022-06-18 DIAGNOSIS — E1165 Type 2 diabetes mellitus with hyperglycemia: Secondary | ICD-10-CM | POA: Diagnosis not present

## 2022-06-18 DIAGNOSIS — Z79899 Other long term (current) drug therapy: Secondary | ICD-10-CM | POA: Diagnosis not present

## 2022-06-18 DIAGNOSIS — R1013 Epigastric pain: Secondary | ICD-10-CM

## 2022-06-18 DIAGNOSIS — K219 Gastro-esophageal reflux disease without esophagitis: Secondary | ICD-10-CM | POA: Diagnosis not present

## 2022-06-18 DIAGNOSIS — E1140 Type 2 diabetes mellitus with diabetic neuropathy, unspecified: Secondary | ICD-10-CM | POA: Diagnosis not present

## 2022-06-18 DIAGNOSIS — Z8719 Personal history of other diseases of the digestive system: Secondary | ICD-10-CM

## 2022-06-18 DIAGNOSIS — Z7982 Long term (current) use of aspirin: Secondary | ICD-10-CM | POA: Diagnosis not present

## 2022-06-18 LAB — COMPREHENSIVE METABOLIC PANEL
ALT: 41 U/L (ref 0–44)
AST: 40 U/L (ref 15–41)
Albumin: 4.1 g/dL (ref 3.5–5.0)
Alkaline Phosphatase: 34 U/L — ABNORMAL LOW (ref 38–126)
Anion gap: 7 (ref 5–15)
BUN: 15 mg/dL (ref 8–23)
CO2: 24 mmol/L (ref 22–32)
Calcium: 9.4 mg/dL (ref 8.9–10.3)
Chloride: 102 mmol/L (ref 98–111)
Creatinine, Ser: 0.83 mg/dL (ref 0.61–1.24)
GFR, Estimated: >60 mL/min (ref >60)
Glucose, Bld: 167 mg/dL — ABNORMAL HIGH (ref 70–99)
Potassium: 3.9 mmol/L (ref 3.5–5.1)
Sodium: 133 mmol/L — ABNORMAL LOW (ref 135–145)
Total Bilirubin: 0.7 mg/dL (ref 0.3–1.2)
Total Protein: 7.2 g/dL (ref 6.5–8.1)

## 2022-06-18 LAB — CBC
HCT: 40.3 % (ref 39.0–52.0)
Hemoglobin: 13.7 g/dL (ref 13.0–17.0)
MCH: 29.5 pg (ref 26.0–34.0)
MCHC: 34 g/dL (ref 30.0–36.0)
MCV: 86.7 fL (ref 80.0–100.0)
Platelets: 253 10*3/uL (ref 150–400)
RBC: 4.65 MIL/uL (ref 4.22–5.81)
RDW: 13.5 % (ref 11.5–15.5)
WBC: 13.1 10*3/uL — ABNORMAL HIGH (ref 4.0–10.5)
nRBC: 0 % (ref 0.0–0.2)

## 2022-06-18 LAB — GLUCOSE, CAPILLARY
Glucose-Capillary: 162 mg/dL — ABNORMAL HIGH (ref 70–99)
Glucose-Capillary: 145 mg/dL — ABNORMAL HIGH (ref 70–99)

## 2022-06-18 LAB — HIV ANTIBODY (ROUTINE TESTING W REFLEX): HIV Screen 4th Generation wRfx: NONREACTIVE

## 2022-06-18 MED ORDER — ACETAMINOPHEN-CODEINE 120-12 MG/5ML PO SOLN: 15.0000 mL | ORAL | 1 refills | Status: DC | PRN

## 2022-06-18 MED ORDER — SIMETHICONE 80 MG PO CHEW: 80.0000 mg | CHEWABLE_TABLET | Freq: Four times a day (QID) | ORAL | 2 refills | Status: DC | PRN

## 2022-06-18 MED ORDER — PANTOPRAZOLE SODIUM 40 MG PO TBEC: 40.0000 mg | DELAYED_RELEASE_TABLET | Freq: Two times a day (BID) | ORAL | 0 refills | Status: DC

## 2022-06-18 MED ORDER — DOCUSATE SODIUM 100 MG PO CAPS: 100.0000 mg | ORAL_CAPSULE | Freq: Two times a day (BID) | ORAL | 2 refills | Status: DC

## 2022-06-18 MED ORDER — METOCLOPRAMIDE HCL 10 MG PO TABS: 10.0000 mg | ORAL_TABLET | Freq: Four times a day (QID) | ORAL | 1 refills | Status: DC | PRN

## 2022-06-18 MED ORDER — ASPIRIN 81 MG PO TABS: 81.0000 mg | ORAL_TABLET | Freq: Every day | ORAL | Status: DC

## 2022-06-18 MED ORDER — ONDANSETRON HCL 4 MG PO TABS: 4.0000 mg | ORAL_TABLET | Freq: Four times a day (QID) | ORAL | 1 refills | Status: DC | PRN

## 2022-06-18 MED ORDER — ASPIRIN 81 MG PO TABS: 81.0000 mg | ORAL_TABLET | Freq: Every day | ORAL | Status: AC

## 2022-06-18 MED ORDER — LIDOCAINE VISCOUS HCL 2 % MT SOLN: 15.0000 mL | OROMUCOSAL | 1 refills | Status: DC | PRN

## 2022-06-18 NOTE — Progress Notes (Signed)
Patient called the unit and stated that his pharmacy does not have Tylenol codeine pain medicine sent earlier by GI. Spoke to the patient.  He wanted a new prescription sent to [facility].  I reordered accordingly.

## 2022-06-18 NOTE — Discharge Summary (Addendum)
Physician Discharge Summary  Nathaniel Gill TIW:[id number] DOB: October 23, 1950 DOA: 06/17/2022  PCP: Unk Pinto, MD  Admit date: 06/17/2022 Discharge date: 06/18/2022  Admitted From: Home Discharge disposition: Home  Recommendations at discharge:  Discharge regimen per GI. Protonix, Zofran, Reglan, Tylenol 3, viscous lidocaine, Colace Diet: 2 weeks of liquid soft foods followed by 4 weeks slowly progressive diet back to regular.  Resume aspirin on 12/18.  Brief narrative: Nathaniel Gill is a 71 y.o. male with PMH significant for longstanding history of GERD progressively worsening over the years not responding to PPI.  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 12/15, patient underwent elective laparoscopic hiatal hernia repair and transoral incision less fundoplication by Dr. Redmond Pulling and Dr. Bryan Lemma. Postprocedure, hospital service was consulted for overnight observation comanagement of medical issues.  Subjective: Patient was seen and examined this morning.  Pleasant elderly Caucasian male.  Propped up in bed.  Not in distress.  No new symptoms.  Feels ready to go home today.  Wife at bedside. Chart reviewed Overnight, no fever, blood pressure elevated to 150s, breathing on room air Labs from this morning with sodium 133  Assessment and plan: Hiatal hernia with GERD Peptic stricture of esophagus GERD without esophagitis S/p laparoscopic hiatal hernia repair and transoral incision less fundoplication 25/05 Discharge regimen per GI- Protonix, Zofran, Reglan, Tylenol 3, viscous lidocaine, Colace Diet: 2 weeks of liquid soft foods followed by 4 weeks slowly progressive diet back to regular   Resistant hypertension Blood pressure elevated to 140s and 150s overnight.   PTA on carvedilol 12.5 mg p.o. twice daily, amlodipine 10 mg daily, olmesartan 40 mg daily, HCTZ 25 mg daily. Continue all  Type 2 diabetes  mellitus Diabetic neuropathy Hyperglycemia due to steroids A1c 6.7 on 06/09/2022 Blood sugar level elevated to currently partially because of dexamethasone as well. Not on meds On Neurontin 300 mg 3 times daily PRN Recent Labs  Lab 06/17/22 2037 06/18/22 0743 06/18/22 1135  GLUCAP 191* 162* 145*   Hyperlipidemia  PTA on aspirin 81 mg daily.  Zetia 10 mg daily, Crestor 40 mg daily Per GI, okay to resume aspirin on 12/18.  Continue others   Hypercalcemia Calcium level improved with hydration Recent Labs    12/28/21 1159 02/15/22 1419 04/17/22 0741 06/09/22 0000 06/18/22 0547  CALCIUM 10.3 9.3 9.8 10.6* 9.4   Wounds:  - Incision - 6 Ports Abdomen 1: Umbilicus 2: Left Left;Lateral 4: Right 5: Right;Lateral 6: Upper (Active)  Placement Date/Time: 06/17/22 1400   Location of Ports: Abdomen  Port: 1:  Location Orientation: Umbilicus  Port: 2:  Location Orientation: Left  Location Orientation: Left;Lateral  Port: 4:  Location Orientation: Right  Port: 5:  Event organiser...    Assessments 06/17/2022  2:44 PM 06/18/2022  7:57 AM  Port 1 Site Assessment -- [employer] 1 Margins -- Attached edges (approximated)  Port 1 Drainage Amount -- None  Port 1 Dressing Type Liquid skin adhesive Liquid skin adhesive  Port 1 Dressing Status -- Clean, Dry, Intact  Port 2 Site Assessment -- Clean;Dry  Port 2 Margins -- Attached edges (approximated)  Port 2 Drainage Amount -- None  Port 2 Dressing Type Liquid skin adhesive Liquid skin adhesive  Port 2 Dressing Status -- Clean, Dry, Intact  Port 3 Site Assessment -- Clean;Dry  Port 3 Margins -- Attached edges (approximated)  Port 3 Drainage Amount -- None  Port 3 Dressing Type Liquid skin  adhesive Liquid skin adhesive  Port 3 Dressing Status -- Clean, Dry, Intact  Port 4 Site Assessment -- Clean;Dry  Port 4 Margins -- Attached edges (approximated)  Port 4 Drainage Amount -- None  Port 4 Dressing Type Liquid skin adhesive Liquid skin  adhesive  Port 4 Dressing Status -- Clean, Dry, Intact  Port 5 Site Assessment -- Clean;Dry  Port 5 Margins -- Attached edges (approximated)  Port 5 Drainage Amount -- None  Port 5 Dressing Type Liquid skin adhesive Liquid skin adhesive  Port 5 Dressing Status -- Clean, Dry, Intact  Port 6 Site Assessment -- Clean;Dry  Port 6 Margins -- Attached edges (approximated)  Port 6 Drainage Amount -- None  Port 6 Dressing Type Liquid skin adhesive Liquid skin adhesive  Port 6 Dressing Status -- Clean, Dry, Intact     No associated orders.     Incision (Closed) 06/17/22 Abdomen (Active)  Date First Assessed/Time First Assessed: 06/17/22 1444   Location: Abdomen    Assessments 06/17/2022  7:33 PM 06/18/2022  7:57 AM  Dressing Type Liquid skin adhesive Liquid skin adhesive  Dressing Clean, Dry, Intact Clean, Dry, Intact  Site / Wound Assessment Clean;Dry Clean;Dry  Closure Skin glue Skin glue  Drainage Amount None None     No associated orders.    Discharge Exam:   Vitals:   06/17/22 2030 06/18/22 0133 06/18/22 0541 06/18/22 0921  BP: (!) 147/82 137/81 (!) 148/79 (!) 153/80  Pulse: 84 88 82 88  Resp: '16 18 18 14  '$ Temp: 98.5 F (36.9 C) 98.4 F (36.9 C) 98.2 F (36.8 C) 98 F (36.7 C)  TempSrc: Oral Oral Oral Oral  SpO2: 93% 93% 93% 93%  Weight:      Height:        Body mass index is 29.82 kg/m.  General exam: Pleasant, elderly Caucasian male.  Not in distress Skin: No rashes, lesions or ulcers. HEENT: Atraumatic, normocephalic, no obvious bleeding Lungs: Clear to auscultation bilaterally CVS: Regular rate and rhythm, no murmur GI/Abd soft, nontender, nondistended, bowel sound present CNS: Alert, awake, oriented x 3 Psychiatry: Mood appropriate Extremities: No pedal edema, no calf tenderness  Follow ups:    Follow-up Information     Greer Pickerel, MD. Go on 07/29/2022.   Specialty: General Surgery Why: For wound re-check at 3:45 pm Contact information: [place] [phone]         Unk Pinto, MD Follow up.   Specialty: Internal Medicine Contact information: [address] [place] [address] [phone]                 Discharge Instructions:   Discharge Instructions     Call MD for:  difficulty breathing, headache or visual disturbances   Complete by: As directed    Call MD for:  extreme fatigue   Complete by: As directed    Call MD for:  hives   Complete by: As directed    Call MD for:  persistant dizziness or light-headedness   Complete by: As directed    Call MD for:  persistant nausea and vomiting   Complete by: As directed    Call MD for:  severe uncontrolled pain   Complete by: As directed    Call MD for:  temperature >100.4   Complete by: As directed    Diet general   Complete by: As directed    Diet: 2 weeks of liquid soft foods followed  by 4 weeks slowly progressive diet back to regular.    Discharge instructions   Complete by: As directed    Recommendations at discharge:  Discharge regimen per GI. Protonix, Zofran, Reglan, Tylenol 3, viscous lidocaine, Colace Diet: 2 weeks of liquid soft foods followed by 4 weeks slowly progressive diet back to regular.  Resume aspirin on 12/18     General discharge instructions: Follow with Primary MD Unk Pinto, MD in 7 days  Please request your PCP  to go over your hospital tests, procedures, radiology results at the follow up. Please get your medicines reviewed and adjusted.  Your PCP may decide to repeat certain labs or tests as needed. Do not drive, operate heavy machinery, perform activities at heights, swimming or participation in water activities or provide baby sitting services if your were admitted for syncope or siezures until you have seen by Primary MD or a Neurologist and advised to do so again. [place] Controlled Substance Reporting System database was reviewed. Do not  drive, operate heavy machinery, perform activities at heights, swim, participate in water activities or provide baby-sitting services while on medications for pain, sleep and mood until your outpatient physician has reevaluated you and advised to do so again.  You are strongly recommended to comply with the dose, frequency and duration of prescribed medications. Activity: As tolerated with Full fall precautions use walker/cane & assistance as needed Avoid using any recreational substances like cigarette, tobacco, alcohol, or non-prescribed drug. If you experience worsening of your admission symptoms, develop shortness of breath, life threatening emergency, suicidal or homicidal thoughts you must seek medical attention immediately by calling 911 or calling your MD immediately  if symptoms less severe. You must read complete instructions/literature along with all the possible adverse reactions/side effects for all the medicines you take and that have been prescribed to you. Take any new medicine only after you have completely understood and accepted all the possible adverse reactions/side effects.  Wear Seat belts while driving. You were cared for by a hospitalist during your hospital stay. If you have any questions about your discharge medications or the care you received while you were in the hospital after you are discharged, you can call the unit and ask to speak with the hospitalist or the covering physician. Once you are discharged, your primary care physician will handle any further medical issues. Please note that NO REFILLS for any discharge medications will be authorized once you are discharged, as it is imperative that you return to your primary care physician (or establish a relationship with a primary care physician if you do not have one).   Discharge wound care:   Complete by: As directed    Increase activity slowly   Complete by: As directed        Discharge Medications:   Allergies as  of 06/18/2022       Reactions   Other    Pt has cochlear implants- Cannot have MRI due to one of cochlear implants is metal.         Medication List     STOP taking these medications    famotidine 20 MG tablet Commonly known as: PEPCID       TAKE these medications    acetaminophen-codeine 120-12 MG/5ML solution Take 15 mLs by mouth every 4 (four) hours as needed for moderate pain.   amLODipine 10 MG tablet Commonly known as: NORVASC TAKE ONE TABLET BY MOUTH DAILY FOR BLOOD PRESSURE   aspirin 81 MG tablet  Take 1 tablet (81 mg total) by mouth daily. Start taking on: June 20, 2022 What changed: These instructions start on June 20, 2022. If you are unsure what to do until then, ask your doctor or other care provider.   BIOTIN PO Take 1 tablet by mouth daily.   carvedilol 12.5 MG tablet Commonly known as: COREG TAKE ONE TABLET BY MOUTH TWICE A DAY   docusate sodium 100 MG capsule Commonly known as: Colace Take 1 capsule (100 mg total) by mouth 2 (two) times daily.   ezetimibe 10 MG tablet Commonly known as: ZETIA TAKE ONE TABLET BY MOUTH DAILY   gabapentin 300 MG capsule Commonly known as: NEURONTIN TAKE ONE CAPSULE BY MOUTH THREE TIMES A DAY AS NEEDED   lidocaine 2 % solution Commonly known as: XYLOCAINE Use as directed 15 mLs in the mouth or throat as needed for mouth pain.   Magnesium 400 MG Tabs Take 400 mg by mouth daily.   metoCLOPramide 10 MG tablet Commonly known as: REGLAN Take 1 tablet (10 mg total) by mouth every 6 (six) hours as needed for nausea.   olmesartan-hydrochlorothiazide 40-25 MG tablet Commonly known as: BENICAR HCT TAKE ONE TABLET BY MOUTH DAILY FOR BLOOD PRESSURE   ondansetron 4 MG tablet Commonly known as: Zofran Take 1 tablet (4 mg total) by mouth every 6 (six) hours as needed for nausea or vomiting.   pantoprazole 40 MG tablet Commonly known as: PROTONIX Take 1 tablet (40 mg total) by mouth 2 (two) times daily  before a meal for 28 days. Twice daily for 2-weeks.  Daily for 2-weeks. What changed: additional instructions   rosuvastatin 40 MG tablet Commonly known as: CRESTOR TAKE ONE TABLET BY MOUTH DAILY FOR CHOLESTEROL   vitamin B-1 250 MG tablet Take 250 mg by mouth daily.   vitamin C 1000 MG tablet Take 1,000 mg by mouth in the morning and at bedtime.   vitamin E 200 UNIT capsule Take 200 Units by mouth 2 (two) times daily.               Discharge Care Instructions  (From admission, onward)           Start     Ordered   06/18/22 0000  Discharge wound care:        06/18/22 1125             The results of significant diagnostics from this hospitalization (including imaging, microbiology, ancillary and laboratory) are listed below for reference.    Procedures and Diagnostic Studies:   No results found.   Labs:   Basic Metabolic Panel: Recent Labs  Lab 06/18/22 0547  NA 133*  K 3.9  CL 102  CO2 24  GLUCOSE 167*  BUN 15  CREATININE 0.83  CALCIUM 9.4   GFR Estimated Creatinine Clearance: 105.6 mL/min (by C-G formula based on SCr of 0.83 mg/dL). Liver Function Tests: Recent Labs  Lab 06/18/22 0547  AST 40  ALT 41  ALKPHOS 34*  BILITOT 0.7  PROT 7.2  ALBUMIN 4.1   No results for input(s): "LIPASE", "AMYLASE" in the last 168 hours. No results for input(s): "AMMONIA" in the last 168 hours. Coagulation profile No results for input(s): "INR", "PROTIME" in the last 168 hours.  CBC: Recent Labs  Lab 06/18/22 0547  WBC 13.1*  HGB 13.7  HCT 40.3  MCV 86.7  PLT 253   Cardiac Enzymes: No results for input(s): "CKTOTAL", "CKMB", "CKMBINDEX", "TROPONINI" in the last 168 hours.  BNP: Invalid input(s): "POCBNP" CBG: Recent Labs  Lab 06/17/22 2037 06/18/22 0743 06/18/22 1135  GLUCAP 191* 162* 145*   D-Dimer No results for input(s): "DDIMER" in the last 72 hours. Hgb A1c No results for input(s): "HGBA1C" in the last 72 hours. Lipid  Profile No results for input(s): "CHOL", "HDL", "LDLCALC", "TRIG", "CHOLHDL", "LDLDIRECT" in the last 72 hours. Thyroid function studies No results for input(s): "TSH", "T4TOTAL", "T3FREE", "THYROIDAB" in the last 72 hours.  Invalid input(s): "FREET3" Anemia work up No results for input(s): "VITAMINB12", "FOLATE", "FERRITIN", "TIBC", "IRON", "RETICCTPCT" in the last 72 hours. Microbiology No results found for this or any previous visit (from the past 240 hour(s)).  Time coordinating discharge: 35 minutes  Signed: Katalena Malveaux  [facility] 06/18/2022, 11:50 AM

## 2022-06-18 NOTE — Progress Notes (Signed)
1 Day Post-Op   Subjective/Chief Complaint: No complaints   Objective: Vital signs in last 24 hours: Temp:  [97.4 F (36.3 C)-98.5 F (36.9 C)] 98.2 F (36.8 C) (12/16 0541) Pulse Rate:  [64-88] 82 (12/16 0541) Resp:  [12-18] 18 (12/16 0541) BP: (137-160)/(79-89) 148/79 (12/16 0541) SpO2:  [92 %-98 %] 93 % (12/16 0541) Weight:  [102.5 kg] 102.5 kg (12/15 1238) Last BM Date : 06/17/22  Intake/Output from previous day: 12/15 0701 - 12/16 0700 In: 1478.3 [P.O.:120; I.V.:1258.3; IV Piggyback:100] Out: 1125 [Urine:1100; Blood:25] Intake/Output this shift: No intake/output data recorded.  General appearance: alert and cooperative Resp: clear to auscultation bilaterally Cardio: regular rate and rhythm GI: soft, minimal tenderness. Good bs. Incisions ok  Lab Results:  Recent Labs    06/18/22 0547  WBC 13.1*  HGB 13.7  HCT 40.3  PLT 253   BMET Recent Labs    06/18/22 0547  NA 133*  K 3.9  CL 102  CO2 24  GLUCOSE 167*  BUN 15  CREATININE 0.83  CALCIUM 9.4   PT/INR No results for input(s): "LABPROT", "INR" in the last 72 hours. ABG No results for input(s): "PHART", "HCO3" in the last 72 hours.  Invalid input(s): "PCO2", "PO2"  Studies/Results: No results found.  Anti-infectives: Anti-infectives (From admission, onward)    Start     Dose/Rate Route Frequency Ordered Stop   06/17/22 1200  ceFAZolin (ANCEF) IVPB 2g/100 mL premix  Status:  Discontinued        2 g 200 mL/hr over 30 Minutes Intravenous  Once 06/17/22 1150 06/17/22 1201   06/17/22 1200  ceFAZolin (ANCEF) IVPB 2g/100 mL premix        2 g 200 mL/hr over 30 Minutes Intravenous On call to O.R. 06/17/22 1150 06/17/22 1345       Assessment/Plan: s/p Procedure(s): LAPAROSCOPIC REPAIR OF HIATAL HERNIA WITH TIF (N/A) ESOPHAGOGASTRODUODENOSCOPY (EGD) (N/A) TRANSORAL INCISIONLESS FUNDOPLICATION (N/A) Clear liquids D/c per GI and Medicine  LOS: 0 days    Autumn Messing III 06/18/2022

## 2022-06-18 NOTE — Progress Notes (Signed)
Gastroenterology Inpatient Follow-up Note   PATIENT IDENTIFICATION  Nathaniel Gill is a 71 y.o. male with a pmh significant for CAD, hypertension, anemia, osteoporosis, gastroparesis, hiatal hernia, GERD, IBS, colon polyps.  Admitted after hiatal hernia repair, esophageal lipoma reduction, TIF procedures. Hospital Day: 2  SUBJECTIVE  The patient's chart has been reviewed. The patient's labs were reviewed. Today, the patient is seen with his wife. The shoulder discomfort and back discomfort that he was experiencing after the procedure has been improving.  He has been walking.  He has been passing gas. He is having some mild upper abdominal discomfort with movement but it is not keeping him from being able to tolerate liquids. P.o. medications are being taken without issue. No fevers or chills have been noted. He is taking pain medication as has been prescribed/scheduled.   OBJECTIVE  Scheduled Inpatient Medications:   carvedilol  12.5 mg Oral BID   Chlorhexidine Gluconate Cloth  6 each Topical Once   dexamethasone (DECADRON) injection  8 mg Intravenous Q6H   insulin aspart  0-15 Units Subcutaneous TID WC   metoCLOPramide (REGLAN) injection  10 mg Intravenous Q6H   ondansetron (ZOFRAN) IV  4 mg Intravenous Q6H   pantoprazole  40 mg Oral BID   Continuous Inpatient Infusions:   sodium chloride 20 mL/hr at 06/17/22 1807   PRN Inpatient Medications: acetaminophen **OR** acetaminophen, acetaminophen-codeine, lidocaine, simethicone   Physical Examination  Temp:  [97.4 F (36.3 C)-98.5 F (36.9 C)] 98 F (36.7 C) (12/16 0921) Pulse Rate:  [64-88] 88 (12/16 0921) Resp:  [12-18] 14 (12/16 0921) BP: (137-160)/(79-89) 153/80 (12/16 0921) SpO2:  [92 %-98 %] 93 % (12/16 0921) Weight:  [102.5 kg] 102.5 kg (12/15 1238) Temp (24hrs), Avg:98 F (36.7 C), Min:97.4 F (36.3 C), Max:98.5 F (36.9 C)  Weight: 102.5 kg GEN: NAD, appears stated age, doesn't appear chronically  ill PSYCH: Cooperative, without pressured speech EYE: Conjunctivae pink, sclerae anicteric ENT: MMM CV: Nontachycardic RESP: No audible wheezing GI: NABS, soft, protuberant abdomen, mild TTP in MEG/RUQ, without rebound or guarding  MSK/EXT: No significant lower extremity edema SKIN: No jaundice NEURO:  Alert & Oriented x 3, no focal deficits   Review of Data   Laboratory Studies   Recent Labs  Lab 06/18/22 0547  NA 133*  K 3.9  CL 102  CO2 24  BUN 15  CREATININE 0.83  GLUCOSE 167*  CALCIUM 9.4   Recent Labs  Lab 06/18/22 0547  AST 40  ALT 41  ALKPHOS 34*    Recent Labs  Lab 06/18/22 0547  WBC 13.1*  HGB 13.7  HCT 40.3  PLT 253   No results for input(s): "APTT", "INR" in the last 168 hours.  Imaging Studies  No results found.  GI Procedures and Studies  EGD - Benign-appearing esophageal stenosis. Dilated with 19 mm TTS balloon with appropriate mucosal rent formation. - Z-line regular, 42 cm from the incisors. - Normal stomach. - Duodenal mucosal lymphangiectasia. - A tattoo was seen in the duodenum. - EsophyX transoral fundoplication was performed.   ASSESSMENT  Mr. Pop is a 71 y.o. male with a pmh significant for CAD, hypertension, anemia, osteoporosis, gastroparesis, hiatal hernia, GERD, IBS, colon polyps.  Admitted after hiatal hernia repair, esophageal lipoma reduction, TIF procedures.  Patient is doing well today.  Some postprocedural discomfort as would be expected from hiatal hernia repair but it seems that he is doing well and tolerating his oral medications.  I think he is stable for  discharge but will be mindful of making sure that he is taking his medications very religiously to ensure that we do not get behind on pain level.  If something else changes or there is some other symptoms that develop the patient and wife are aware to call our number in case he has to return to the hospital.  I am okay for discharge today.   PLAN/RECOMMENDATIONS   - Protonix 40 mg PO BID for 2 weeks, then 40 mg daily for 2 weeks, then 20 mg daily for 1 week, then prn  - Discharge with Zofran 4 mg PO prn Q6 hours for nausea (30/1 refill) - Discharge with Reglan 10 mg PO prn Q6 hours for nausea (30/1 refill) - Discharge with Simethicone 80 mg PO prn Q6 hours for bloating/abdominal discomfort (Rx) - Continue scopolamine patch x3 days (already on patient) - Continue Tylenol 3 (APAP 120 mg/codeine 12 mg per 5 mL): 15 mL's every 4 hours prn pain (120 mL/1 refill) - Continue Viscous lidocaine 2% every 6 hours as needed for odynophagia, sore throat (100 mL/1 refill) - Continue Colace 100 mg p.o. twice daily if taking pain medications - May restart aspirin on 12/18  Diet:  - Continue clear liquids today and full liquids tomorrow - Over next couple of weeks you will be on a liquid soft food advancement (follow diet as outlined by Dr. Bryan Lemma in previous documentation/packets that he has given to you all)  Post Op Activity:  - Week 1: encourage short distance walking, minimal physical activity, no lifting >5 lbs  - Week 2: Slow climbing stairs, no intense exercise, no lifting >5 lbs  - Week 3-6: No intense exercise, may lift up to 25 lbs  - Week 7: Resume normal activity   We sincerely appreciate the assistance by the Hospital service in the admission and overnight observation of this patient  Dr. Vivia Ewing team will reach out to patient on Monday. They should call if any issues of Fever/Chills/Progressive Chest or Abdominal Pain.  OK for discharge.  Please page/call with questions or concerns.   [name], MD [facility] Office # [phone]    LOS: 0 days  Irving Copas  06/18/2022, 11:47 AM

## 2022-06-18 NOTE — TOC Progression Note (Signed)
Transition of Care [facility]) - Progression Note    Patient Details  Name: Nathaniel Gill MRN: [id number] Date of Birth: 1950-11-25  [facility]) CM/SW Contact  Henrietta Dine, RN Phone Number: [phone] 06/18/2022, 9:21 AM  Clinical Narrative:    Notified that pt's wife wanted to discuss insurance auth; contacted pt's wife Nathaniel Gill [phone]); she says she would like to know what insurance would cover; advised Mrs Hargens to contact their insurance company b/c they are the best source for information related to their benefits; she verbalized understanding.        Expected Discharge Plan and Services                                                 Social Determinants of Health (SDOH) Interventions    Readmission Risk Interventions     No data to display

## 2022-06-19 ENCOUNTER — Encounter: Payer: Self-pay | Admitting: Gastroenterology

## 2022-06-20 ENCOUNTER — Other Ambulatory Visit: Payer: Self-pay

## 2022-06-20 MED ORDER — EZETIMIBE 10 MG PO TABS: 10.0000 mg | ORAL_TABLET | Freq: Every day | ORAL | 1 refills | Status: DC

## 2022-06-20 NOTE — Telephone Encounter (Signed)
Not 006% certain what the redness is, but possibly a mild reaction to a new medication.  As far as I can recall, the only thing new would be the postoperative pain medication (Tylenol/codeine).  Hold off on taking that today and see if this resolves.  If we need something different from a pain standpoint, we can call that in.  Certainly if any shortness of breath, CP, fever, or other concerns, call us or come to the ER.  He also sent a separate message regarding taking a shower after surgery.  Yes, typically have people wait 1 day, so perfectly fine to take a shower today.  Can call the surgery clinic if questions regarding the laparoscopy surgical sites.

## 2022-06-20 NOTE — Telephone Encounter (Signed)
No specific need for follow-up with his PCP prior to his follow-up with me.    We did discuss a work note when he was in the hospital, and I am perfectly happy for him to have one.  Can you please provide him with a work note to cover him for the next 2 weeks to allow for postoperative recovery.  Thank you.

## 2022-06-20 NOTE — Telephone Encounter (Signed)
Correction pt is scheduled for appt on 07/22/22.

## 2022-06-21 ENCOUNTER — Encounter: Payer: Self-pay | Admitting: Nurse Practitioner

## 2022-06-21 ENCOUNTER — Encounter: Payer: Self-pay | Admitting: Gastroenterology

## 2022-06-21 NOTE — Telephone Encounter (Signed)
Thank you for the excellent description.  Very much would appear to be orthostatic changes, and looks like good response to rest and fluids.  Recommend continued hydration, slowly advancing activity as tolerated, and careful with large positional changes in the short-term.  I do not think this is indication of anything having gone wrong or need for further evaluation at this juncture.  Certainly if anything progresses, chest pain, shortness of breath, fever, etc., to let us know.

## 2022-06-22 NOTE — Progress Notes (Unsigned)
Hospital follow up  Assessment and Plan: Hospital visit follow up for:    Nieko was seen today for hospitalization follow-up.  Diagnoses and all orders for this visit:  Hiatal hernia/Gastroesophageal reflux disease with esophagitis without hemorrhage/ History of repair of hiatal hernia On liquid diet for next 5 weeks Continue to follow with gastro  Resistant hypertension Stop amlodipine Continue carvedilol 12.5 mg BID and Olmesartan HCTZ 40/25 mg QD Monitor BP, If consistently greater than 130/80 or less than 100/60 notify the office Go to the ER if any chest pain, shortness of breath, nausea, dizziness, severe HA, changes vision/speech   Type 2 diabetes mellitus with stage 2 chronic kidney disease, without long-term current use of insulin (HCC) Check blood sugars fasting in am and before supper Aiming for blood sugars between 90-120, if blood sugars are in upper 100's or above notify the office  CKD stage 2 due to type 2 diabetes mellitus (HCC) Increase fluids, avoid NSAIDS, monitor sugars, will monitor -CBC - CMP  Hyperlipidemia associated with type 2 diabetes mellitus (HCC) Continue rosuvastatin Diet is altered as he is currently on liquid diet for the next 5 weeks due to hiatal hernia repair with fundoplication  Hypercalcemia -CMP     All medications were reviewed with patient and family and fully reconciled. All questions answered fully, and patient and family members were encouraged to call the office with any further questions or concerns. Discussed goal to avoid readmission related to this diagnosis.     Over 40 minutes of exam, counseling, chart review, and complex, high/moderate level critical decision making was performed this visit.   Future Appointments  Date Time Provider [place]  06/23/2022  4:00 PM Alycia Rossetti, NP [facility] None  07/22/2022  3:00 PM Lavena Bullion, DO [facility]  07/26/2022  9:30 AM Pyrtle, Lajuan Lines, MD [facility]  [facility]  09/09/2022 10:30 AM Unk Pinto, MD [facility] None  01/03/2023  9:00 AM Darrol Jump, NP [facility] None  06/12/2023 10:00 AM Alycia Rossetti, NP [facility] None     HPI 71 y.o.male presents for follow up for transition from recent hospitalization or SNIF stay. Admit date to the hospital was 06/17/22, patient was discharged from the hospital on 06/18/22 and our clinical staff contacted the office the day after discharge to set up a follow up appointment. The discharge summary, medications, and diagnostic test results were reviewed before meeting with the patient. The patient was admitted for:   Hiatal hernia with GERD Peptic stricture of esophagus GERD without esophagitis S/p laparoscopic hiatal hernia repair and transoral incision less fundoplication 48/18 Discharge regimen per GI- Protonix, Zofran, Reglan, Tylenol 3, viscous lidocaine, Colace Diet: 2 weeks of liquid soft foods followed by 4 weeks slowly progressive diet back to regular    Resistant hypertension Blood pressure elevated to 140s and 150s overnight.   PTA on carvedilol 12.5 mg p.o. twice daily, amlodipine 10 mg daily, olmesartan 40 mg daily, HCTZ 25 mg daily. Continue all   Type 2 diabetes mellitus Diabetic neuropathy Hyperglycemia due to steroids A1c 6.7 on 06/09/2022 Blood sugar level elevated to currently partially because of dexamethasone as well. Not on meds On Neurontin 300 mg 3 times daily PRN Last Labs       Recent Labs  Lab 06/17/22 2037 06/18/22 0743 06/18/22 1135  GLUCAP 191* 162* 145*      Hyperlipidemia  PTA on aspirin 81 mg daily.  Zetia 10 mg daily, Crestor 40 mg daily Per GI, okay to resume  aspirin on 12/18.  Continue others   Hypercalcemia Calcium level improved with hydration    Home health is not involved.   Images while in the hospital: No results found.  He has been having hypotensive episodes at home with BP's running in 90/50-60 and will get dizzy.  BP  Readings from Last 3 Encounters:  06/23/22 118/72  06/18/22 (!) 153/80  06/14/22 (!) 143/78   BMI is Body mass index is 28.5 kg/m., he has been working on diet and exercise.He is on a liquid diet for the next 5-6 weeks s/p hiatal hernia repair with fundoplication. He is on water, milk, diluted electrolyte drinks, non acidic fruits/vegetables, liquid puddings/creams, milkshakes, baby food.  Wt Readings from Last 3 Encounters:  06/23/22 216 lb (98 kg)  06/17/22 226 lb (102.5 kg)  06/14/22 226 lb (102.5 kg)     Current Outpatient Medications (Cardiovascular):    amLODipine (NORVASC) 10 MG tablet, TAKE ONE TABLET BY MOUTH DAILY FOR BLOOD PRESSURE   carvedilol (COREG) 12.5 MG tablet, TAKE ONE TABLET BY MOUTH TWICE A DAY   ezetimibe (ZETIA) 10 MG tablet, Take 1 tablet (10 mg total) by mouth daily.   olmesartan-hydrochlorothiazide (BENICAR HCT) 40-25 MG tablet, TAKE ONE TABLET BY MOUTH DAILY FOR BLOOD PRESSURE   rosuvastatin (CRESTOR) 40 MG tablet, TAKE ONE TABLET BY MOUTH DAILY FOR CHOLESTEROL   Current Outpatient Medications (Analgesics):    acetaminophen-codeine 120-12 MG/5ML solution, Take 15 mLs by mouth every 4 (four) hours as needed for moderate pain.   aspirin 81 MG tablet, Take 1 tablet (81 mg total) by mouth daily.   Current Outpatient Medications (Other):    Ascorbic Acid (VITAMIN C) 1000 MG tablet, Take 1,000 mg by mouth in the morning and at bedtime.   BIOTIN PO, Take 1 tablet by mouth daily.   docusate sodium (COLACE) 100 MG capsule, Take 1 capsule (100 mg total) by mouth 2 (two) times daily.   gabapentin (NEURONTIN) 300 MG capsule, TAKE ONE CAPSULE BY MOUTH THREE TIMES A DAY AS NEEDED   lidocaine (XYLOCAINE) 2 % solution, Use as directed 15 mLs in the mouth or throat as needed for mouth pain.   Magnesium 400 MG TABS, Take 400 mg by mouth daily.   metoCLOPramide (REGLAN) 10 MG tablet, Take 1 tablet (10 mg total) by mouth every 6 (six) hours as needed for nausea.    ondansetron (ZOFRAN) 4 MG tablet, Take 1 tablet (4 mg total) by mouth every 6 (six) hours as needed for nausea or vomiting.   pantoprazole (PROTONIX) 40 MG tablet, Take 1 tablet (40 mg total) by mouth 2 (two) times daily before a meal for 28 days. Twice daily for 2-weeks.  Daily for 2-weeks.   simethicone (GAS-X) 80 MG chewable tablet, Chew 1 tablet (80 mg total) by mouth 4 (four) times daily as needed for flatulence.   Thiamine HCl (VITAMIN B-1) 250 MG tablet, Take 250 mg by mouth daily.   vitamin E 200 UNIT capsule, Take 200 Units by mouth 2 (two) times daily.  Past Medical History:  Diagnosis Date   Arthritis    CAD (coronary artery disease) 07/13/2021   Cor Ca++ Score 837.  Mixed mod plaque prox RCA(50 to 69%).  Mild Ca++ plaque mid RCA (25-49%).  Minimal Ca++ plaque prox LCx (0-24%).  Mid LCx CA++ plaque (50 to 69%), Prox OM1 CA++ plaque (50-69%).  Prox LAD mild to 0-24%. LM  mild 0-24% Ca++  plaque. => CT FFR + for distal LCx (small  caliber)  Negative findings in the LAD, OM1 and  and RCA. =>  Would not recommend catheterization.   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 Hilarie Fredrickson:  Low-grade of narrowing Schatzki ring. Dilated to 18 mm with balloon. - 3 cm hiatal hernia. - Normal stomach. 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    Gastroparesis    GERD (gastroesophageal reflux disease)    Hiatal hernia    History of colon polyps    Hyperlipidemia    Hypertension    No sign of renal artery stenosis   IBS (irritable bowel syndrome)    Pre-diabetes    RBBB    Vertigo    Vitamin D deficiency      Allergies  Allergen Reactions   Other     Pt has cochlear implants- Cannot have MRI due to one of cochlear implants is metal.     ROS: all negative except above.    Physical Exam: Filed Weights   06/23/22 1535  Weight: 216 lb (98 kg)   BP 118/72   Pulse 67   Temp 97.7 F (36.5 C)   Ht '6\' 1"'$  (1.854 m)   Wt 216 lb (98 kg)   SpO2 97%   BMI 28.50 kg/m  General Appearance: Well nourished, in no apparent distress. Eyes: PERRLA, EOMs, conjunctiva no swelling or erythema Sinuses: No Frontal/maxillary tenderness ENT/Mouth: Ext aud canals clear, TMs without erythema, bulging. No erythema, swelling, or exudate on post pharynx.  Tonsils not swollen or erythematous. Hearing normal.  Neck: Supple, thyroid normal.  Respiratory: Respiratory effort normal, BS equal bilaterally without rales, rhonchi, wheezing or stridor.  Cardio: RRR with no MRGs. Brisk peripheral pulses without edema.  Abdomen: Soft, + BS.  Non tender, no guarding, rebound, hernias, masses. Lymphatics: Non tender without lymphadenopathy.  Musculoskeletal: Full ROM, 5/5 strength, normal gait.  Skin: Warm, dry , abdominal laparoscopic incisions are healing well Neuro: Cranial nerves intact. Normal muscle tone, no cerebellar symptoms. Sensation intact.  Psych: Awake and oriented X 3, normal affect, Insight and Judgment appropriate.     Alycia Rossetti, NP 3:49 PM [facility]

## 2022-06-23 ENCOUNTER — Encounter: Payer: Self-pay | Admitting: Nurse Practitioner

## 2022-06-23 ENCOUNTER — Ambulatory Visit (INDEPENDENT_AMBULATORY_CARE_PROVIDER_SITE_OTHER): Payer: Medicare Other | Admitting: Nurse Practitioner

## 2022-06-23 VITALS — BP 118/72 | HR 67 | Temp 97.7°F | Ht 73.0 in | Wt 216.0 lb

## 2022-06-23 DIAGNOSIS — Z8719 Personal history of other diseases of the digestive system: Secondary | ICD-10-CM

## 2022-06-23 DIAGNOSIS — E785 Hyperlipidemia, unspecified: Secondary | ICD-10-CM

## 2022-06-23 DIAGNOSIS — I1A0 Resistant hypertension: Secondary | ICD-10-CM

## 2022-06-23 DIAGNOSIS — K449 Diaphragmatic hernia without obstruction or gangrene: Secondary | ICD-10-CM

## 2022-06-23 DIAGNOSIS — N182 Chronic kidney disease, stage 2 (mild): Secondary | ICD-10-CM | POA: Diagnosis not present

## 2022-06-23 DIAGNOSIS — K2100 Gastro-esophageal reflux disease with esophagitis, without bleeding: Secondary | ICD-10-CM

## 2022-06-23 DIAGNOSIS — E1122 Type 2 diabetes mellitus with diabetic chronic kidney disease: Secondary | ICD-10-CM

## 2022-06-23 DIAGNOSIS — Z9889 Other specified postprocedural states: Secondary | ICD-10-CM

## 2022-06-23 DIAGNOSIS — E1169 Type 2 diabetes mellitus with other specified complication: Secondary | ICD-10-CM

## 2022-06-23 MED ORDER — BLOOD GLUCOSE MONITOR KIT: PACK | 0 refills | Status: AC

## 2022-06-23 NOTE — Patient Instructions (Signed)
Stop amlodipine Continue to check BP if consistently greater than 130/80 or less than 100/60 notify the office  Check blood sugar fasting in the morning and before supper Hoping for blood sugars 90-120  Hypertension, Adult High blood pressure (hypertension) is when the force of blood pumping through the arteries is too strong. The arteries are the blood vessels that carry blood from the heart throughout the body. Hypertension forces the heart to work harder to pump blood and may cause arteries to become narrow or stiff. Untreated or uncontrolled hypertension can lead to a heart attack, heart failure, a stroke, kidney disease, and other problems. A blood pressure reading consists of a higher number over a lower number. Ideally, your blood pressure should be below 120/80. The first ("top") number is called the systolic pressure. It is a measure of the pressure in your arteries as your heart beats. The second ("bottom") number is called the diastolic pressure. It is a measure of the pressure in your arteries as the heart relaxes. What are the causes? The exact cause of this condition is not known. There are some conditions that result in high blood pressure. What increases the risk? Certain factors may make you more likely to develop high blood pressure. Some of these risk factors are under your control, including: Smoking. Not getting enough exercise or physical activity. Being overweight. Having too much fat, sugar, calories, or salt (sodium) in your diet. Drinking too much alcohol. Other risk factors include: Having a personal history of heart disease, diabetes, high cholesterol, or kidney disease. Stress. Having a family history of high blood pressure and high cholesterol. Having obstructive sleep apnea. Age. The risk increases with age. What are the signs or symptoms? High blood pressure may not cause symptoms. Very high blood pressure (hypertensive crisis) may cause: Headache. Fast or  irregular heartbeats (palpitations). Shortness of breath. Nosebleed. Nausea and vomiting. Vision changes. Severe chest pain, dizziness, and seizures. How is this diagnosed? This condition is diagnosed by measuring your blood pressure while you are seated, with your arm resting on a flat surface, your legs uncrossed, and your feet flat on the floor. The cuff of the blood pressure monitor will be placed directly against the skin of your upper arm at the level of your heart. Blood pressure should be measured at least twice using the same arm. Certain conditions can cause a difference in blood pressure between your right and left arms. If you have a high blood pressure reading during one visit or you have normal blood pressure with other risk factors, you may be asked to: Return on a different day to have your blood pressure checked again. Monitor your blood pressure at home for 1 week or longer. If you are diagnosed with hypertension, you may have other blood or imaging tests to help your health care provider understand your overall risk for other conditions. How is this treated? This condition is treated by making healthy lifestyle changes, such as eating healthy foods, exercising more, and reducing your alcohol intake. You may be referred for counseling on a healthy diet and physical activity. Your health care provider may prescribe medicine if lifestyle changes are not enough to get your blood pressure under control and if: Your systolic blood pressure is above 130. Your diastolic blood pressure is above 80. Your personal target blood pressure may vary depending on your medical conditions, your age, and other factors. Follow these instructions at home: Eating and drinking  Eat a diet that is high in  fiber and potassium, and low in sodium, added sugar, and fat. An example of this eating plan is called the DASH diet. DASH stands for Dietary Approaches to Stop Hypertension. To eat this way: Eat  plenty of fresh fruits and vegetables. Try to fill one half of your plate at each meal with fruits and vegetables. Eat whole grains, such as whole-wheat pasta, brown rice, or whole-grain bread. Fill about one fourth of your plate with whole grains. Eat or drink low-fat dairy products, such as skim milk or low-fat yogurt. Avoid fatty cuts of meat, processed or cured meats, and poultry with skin. Fill about one fourth of your plate with lean proteins, such as fish, chicken without skin, beans, eggs, or tofu. Avoid pre-made and processed foods. These tend to be higher in sodium, added sugar, and fat. Reduce your daily sodium intake. Many people with hypertension should eat less than 1,500 mg of sodium a day. Do not drink alcohol if: Your health care provider tells you not to drink. You are pregnant, may be pregnant, or are planning to become pregnant. If you drink alcohol: Limit how much you have to: 0-1 drink a day for women. 0-2 drinks a day for men. Know how much alcohol is in your drink. In the [place] one drink equals one 12 oz bottle of beer (355 mL), one 5 oz glass of wine (148 mL), or one 1 oz glass of hard liquor (44 mL). Lifestyle  Work with your health care provider to maintain a healthy body weight or to lose weight. Ask what an ideal weight is for you. Get at least 30 minutes of exercise that causes your heart to beat faster (aerobic exercise) most days of the week. Activities may include walking, swimming, or biking. Include exercise to strengthen your muscles (resistance exercise), such as Pilates or lifting weights, as part of your weekly exercise routine. Try to do these types of exercises for 30 minutes at least 3 days a week. Do not use any products that contain nicotine or tobacco. These products include cigarettes, chewing tobacco, and vaping devices, such as e-cigarettes. If you need help quitting, ask your health care provider. Monitor your blood pressure at home as told by  your health care provider. Keep all follow-up visits. This is important. Medicines Take over-the-counter and prescription medicines only as told by your health care provider. Follow directions carefully. Blood pressure medicines must be taken as prescribed. Do not skip doses of blood pressure medicine. Doing this puts you at risk for problems and can make the medicine less effective. Ask your health care provider about side effects or reactions to medicines that you should watch for. Contact a health care provider if you: Think you are having a reaction to a medicine you are taking. Have headaches that keep coming back (recurring). Feel dizzy. Have swelling in your ankles. Have trouble with your vision. Get help right away if you: Develop a severe headache or confusion. Have unusual weakness or numbness. Feel faint. Have severe pain in your chest or abdomen. Vomit repeatedly. Have trouble breathing. These symptoms may be an emergency. Get help right away. Call 911. Do not wait to see if the symptoms will go away. Do not drive yourself to the hospital. Summary Hypertension is when the force of blood pumping through your arteries is too strong. If this condition is not controlled, it may put you at risk for serious complications. Your personal target blood pressure may vary depending on your medical conditions, your  age, and other factors. For most people, a normal blood pressure is less than 120/80. Hypertension is treated with lifestyle changes, medicines, or a combination of both. Lifestyle changes include losing weight, eating a healthy, low-sodium diet, exercising more, and limiting alcohol. This information is not intended to replace advice given to you by your health care provider. Make sure you discuss any questions you have with your health care provider. Document Revised: 04/27/2021 Document Reviewed: 04/27/2021 Elsevier Patient Education  [place].

## 2022-06-24 LAB — COMPLETE METABOLIC PANEL WITH GFR
AG Ratio: 1.7 (calc) (ref 1.0–2.5)
ALT: 34 U/L (ref 9–46)
AST: 25 U/L (ref 10–35)
Albumin: 4.3 g/dL (ref 3.6–5.1)
Alkaline phosphatase (APISO): 44 U/L (ref 35–144)
BUN/Creatinine Ratio: 27 (calc) — ABNORMAL HIGH (ref 6–22)
BUN: 31 mg/dL — ABNORMAL HIGH (ref 7–25)
CO2: 31 mmol/L (ref 20–32)
Calcium: 10.6 mg/dL — ABNORMAL HIGH (ref 8.6–10.3)
Chloride: 100 mmol/L (ref 98–110)
Creat: 1.14 mg/dL (ref 0.70–1.35)
Globulin: 2.6 g/dL (calc) (ref 1.9–3.7)
Glucose, Bld: 110 mg/dL — ABNORMAL HIGH (ref 65–99)
Potassium: 4.6 mmol/L (ref 3.5–5.3)
Sodium: 138 mmol/L (ref 135–146)
Total Bilirubin: 0.5 mg/dL (ref 0.2–1.2)
Total Protein: 6.9 g/dL (ref 6.1–8.1)
eGFR: 70 mL/min/{1.73_m2} (ref >=60)

## 2022-06-24 LAB — CBC WITH DIFFERENTIAL/PLATELET
Absolute Monocytes: 968 cells/uL — ABNORMAL HIGH (ref 200–950)
Basophils Absolute: 68 cells/uL (ref 0–200)
Basophils Relative: 0.9 %
Eosinophils Absolute: 623 cells/uL — ABNORMAL HIGH (ref 15–500)
Eosinophils Relative: 8.3 %
HCT: 42.1 % (ref 38.5–50.0)
Hemoglobin: 14.3 g/dL (ref 13.2–17.1)
Lymphs Abs: 1890 cells/uL (ref 850–3900)
MCH: 29.2 pg (ref 27.0–33.0)
MCHC: 34 g/dL (ref 32.0–36.0)
MCV: 86.1 fL (ref 80.0–100.0)
MPV: 9.3 fL (ref 7.5–12.5)
Monocytes Relative: 12.9 %
Neutro Abs: 3953 cells/uL (ref 1500–7800)
Neutrophils Relative %: 52.7 %
Platelets: 353 10*3/uL (ref 140–400)
RBC: 4.89 10*6/uL (ref 4.20–5.80)
RDW: 13 % (ref 11.0–15.0)
Total Lymphocyte: 25.2 %
WBC: 7.5 10*3/uL (ref 3.8–10.8)

## 2022-06-25 ENCOUNTER — Encounter: Payer: Self-pay | Admitting: Gastroenterology

## 2022-06-29 ENCOUNTER — Encounter: Payer: Self-pay | Admitting: *Deleted

## 2022-07-07 ENCOUNTER — Encounter: Payer: Self-pay | Admitting: Gastroenterology

## 2022-07-07 ENCOUNTER — Telehealth: Payer: Self-pay | Admitting: Gastroenterology

## 2022-07-07 ENCOUNTER — Encounter: Payer: Self-pay | Admitting: Internal Medicine

## 2022-07-07 NOTE — Telephone Encounter (Signed)
Inbound call from patient requesting to speak with a nurse about current issues he is having. Please advise.

## 2022-07-07 NOTE — Telephone Encounter (Signed)
Spoke with pt. Pt reports that bleeding happened one time while he was having a bowel movement today. Pt reports that stool was formed and very hard to pass. Pt is having bowel movements once every three days when he usually goes daily. Pt denies dizziness lightheadedness or SOB. Pt also stated that he had a dull pain in stomach after straining to have bowel movement. Pt is currently taking colace 100 mg twice a day but has not taken anything else to help him have a bowel movement.

## 2022-07-07 NOTE — Telephone Encounter (Signed)
Spoke with pt. See 1/4 patient mychart message.

## 2022-07-08 NOTE — Telephone Encounter (Signed)
He can cancel the appointment with me and keep appointment with Dr. Bryan Lemma

## 2022-07-08 NOTE — Telephone Encounter (Signed)
Spoke with pt and gave pt recommendation. Pt verbalized understanding and had no other concerns at end of call.

## 2022-07-18 ENCOUNTER — Encounter: Payer: Self-pay | Admitting: Internal Medicine

## 2022-07-18 ENCOUNTER — Encounter: Payer: Self-pay | Admitting: Cardiology

## 2022-07-22 ENCOUNTER — Encounter: Payer: Self-pay | Admitting: Gastroenterology

## 2022-07-22 ENCOUNTER — Ambulatory Visit (INDEPENDENT_AMBULATORY_CARE_PROVIDER_SITE_OTHER): Payer: BC Managed Care – PPO | Admitting: Gastroenterology

## 2022-07-22 VITALS — BP 160/80 | HR 59 | Ht 73.0 in | Wt 217.0 lb

## 2022-07-22 DIAGNOSIS — Z9889 Other specified postprocedural states: Secondary | ICD-10-CM

## 2022-07-22 DIAGNOSIS — K2100 Gastro-esophageal reflux disease with esophagitis, without bleeding: Secondary | ICD-10-CM

## 2022-07-22 NOTE — Patient Instructions (Addendum)
Take Protonix 40 MG daily for 2 weeks, then take every other day for 1 week, and then stop.  Please follow up in 6- months. Give [place] a call at [phone] to schedule an appointment.   _______________________________________________________  If your blood pressure at your visit was 140/90 or greater, please contact your primary care physician to follow up on this.  _______________________________________________________  If you are age 70 or older, your body mass index should be between 23-30. Your Body mass index is 28.63 kg/m. If this is out of the aforementioned range listed, please consider follow up with your Primary Care Provider.  __________________________________________________________  The [facility] providers would like to encourage you to use [facility] to communicate with providers for non-urgent requests or questions.  Due to long hold times on the telephone, sending your provider a message by [facility] may be a faster and more efficient way to get a response.  Please allow 48 business hours for a response.  Please remember that this is for non-urgent requests.   Due to recent changes in healthcare laws, you may see the results of your imaging and laboratory studies on MyChart before your provider has had a chance to review them.  We understand that in some cases there may be results that are confusing or concerning to you. Not all laboratory results come back in the same time frame and the provider may be waiting for multiple results in order to interpret others.  Please give [place] 48 hours in order for your provider to thoroughly review all the results before contacting the office for clarification of your results.     Thank you for choosing me and [facility].  Vito Cirigliano, D.O.

## 2022-07-22 NOTE — Progress Notes (Signed)
Chief Complaint:    Postoperative follow-up  GI History: 72 y.o. male with a history of CAD, DDD, HTN, HLD, Meckel's bleed in 1956  s/p surgical resection, follows with me for longstanding history of GERD requiring concomitant laparoscopic hiatal hernia pair and TIF on 06/17/2022.  Longstanding history of GERD for a few years.  Symptoms responsive to high-dose Protonix, but unable to wean to daily dosing due to breakthrough.   GERD history: -Index symptoms: Regurgitation, heartburn, sour brash, belch.  Rare dysphagia -Exacerbating features: Tomato-based sauce, supine -Medications trialed: Pantoprazole, Pepcid -Current medications: Pantoprazole 40 mg twice daily; starting titration today -Complications: Hiatal hernia, peptic stricture   GERD evaluation: -Last EGD: 05/2022 -Barium esophagram: 09/2019: Small hiatal hernia, moderate distal esophageal mucosal ring with brief sticking of 13 mm barium tablet -Esophageal Manometry: Completed 05/16/2022.  Awaiting final results -pH/Impedance: None -Bravo: None - CT A/P: 04/17/2022: Diverticulosis, otherwise normal-appearing GI tract - HIDA: 05/2022: Normal   Endoscopic History: - EGD (11/22/2019): Mild nonobstructing ring in GEJ dilated with 18 mm TTS balloon, 3 cm HH, Hill grade 4 valve.  10 mm polyp in D2 - Small bowel enteroscopy (01/27/2020): Nonobstructing ring at GE junction, 3 cm HH.  Moderate antral gastritis (path benign).  Duodenal lymphangiectasia.  18 mm semisessile polyp at D2/D3 removed via piecemeal resection with left snare (path: Duodenal adenoma), then the area superior to the polyp was tattooed with spot - EGD (01/23/2021): Nonobstructing ring, 5 cm HH, duodenal scar in the second/third portion of the duodenum adenoma resection (path: Benign small bowel mucosa).  Recommended repeat EGD in 2 years for continued adenoma surveillance - EGD (05/13/2022): Low-grade peptic stricture dilated with 18 mm TTS balloon, 4 cm HH, Hill grade 4  valve.  Normal stomach.  Tattoo D2.  Polypoid mucosa in D2 (path: Benign), Benign duodenal lymphangiectasia - Concomitant laparoscopic hiatal hernia repair and TIF (06/17/2022  HPI:     [name] presenting [facility] for postoperative follow-up after cTIF on 06/17/2022.  He states he is feeling great and no breakthrough reflux since surgery.  Tolerating post op diet well. Has advanced to soft foods without any issue. Still doing pantoprazole 40 mg BID.  No dysphagia or breakthrough reflux.  Has had some occasional gas pains in the RUQ which resolves with standing, stretching, releasing gas.  Otherwise feeling very well since surgery.  He is very eager to exercise and continue advancing his diet.  He has lost approximately 20#with the postoperative diet.  Review of systems:     No chest pain, no SOB, no fevers, no urinary sx   Past Medical History:  Diagnosis Date   Aortic atherosclerosis (HCC)    Arthritis    CAD (coronary artery disease) 07/13/2021   Cor Ca++ Score 837.  Mixed mod plaque prox RCA(50 to 69%).  Mild Ca++ plaque mid RCA (25-49%).  Minimal Ca++ plaque prox LCx (0-24%).  Mid LCx CA++ plaque (50 to 69%), Prox OM1 CA++ plaque (50-69%).  Prox LAD mild to 0-24%. LM  mild 0-24% Ca++  plaque. => CT FFR + for distal LCx (small caliber)  Negative findings in the LAD, OM1 and  and RCA. =>  Would not recommend catheterization.   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 Hilarie Fredrickson:  Low-grade  of narrowing Schatzki ring. Dilated to 18 mm with balloon. - 3 cm hiatal hernia. - Normal stomach. 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    Gallstone    Gastroparesis    GERD (gastroesophageal reflux disease)    Hiatal hernia     History of colon polyps    Hyperlipidemia    Hypertension    No sign of renal artery stenosis   IBS (irritable bowel syndrome)    Inguinal hernia, bilateral    Pre-diabetes    RBBB    Schatzki's ring    Vertigo    Vitamin D deficiency     Patient's surgical history, family medical history, social history, medications and allergies were all reviewed in Epic    Current Outpatient Medications  Medication Sig Dispense Refill   acetaminophen-codeine 120-12 MG/5ML solution Take 15 mLs by mouth every 4 (four) hours as needed for moderate pain. 120 mL 1   amLODipine (NORVASC) 10 MG tablet TAKE ONE TABLET BY MOUTH DAILY FOR BLOOD PRESSURE 90 tablet 3   Ascorbic Acid (VITAMIN C) 1000 MG tablet Take 1,000 mg by mouth in the morning and at bedtime.     aspirin 81 MG tablet Take 1 tablet (81 mg total) by mouth daily. 30 tablet    BIOTIN PO Take 1 tablet by mouth daily.     blood glucose meter kit and supplies KIT Dispense based on patient and insurance preference. Use up to four times daily as directed. 1 each 0   carvedilol (COREG) 12.5 MG tablet TAKE ONE TABLET BY MOUTH TWICE A DAY 180 tablet 2   docusate sodium (COLACE) 100 MG capsule Take 1 capsule (100 mg total) by mouth 2 (two) times daily. 60 capsule 2   ezetimibe (ZETIA) 10 MG tablet Take 1 tablet (10 mg total) by mouth daily. 90 tablet 1   gabapentin (NEURONTIN) 300 MG capsule TAKE ONE CAPSULE BY MOUTH THREE TIMES A DAY AS NEEDED 90 capsule 1   Magnesium 400 MG TABS Take 400 mg by mouth daily.     metoCLOPramide (REGLAN) 10 MG tablet Take 1 tablet (10 mg total) by mouth every 6 (six) hours as needed for nausea. 30 tablet 1   olmesartan-hydrochlorothiazide (BENICAR HCT) 40-25 MG tablet TAKE ONE TABLET BY MOUTH DAILY FOR BLOOD PRESSURE 90 tablet 3   ondansetron (ZOFRAN) 4 MG tablet Take 1 tablet (4 mg total) by mouth every 6 (six) hours as needed for nausea or vomiting. 30 tablet 1   rosuvastatin (CRESTOR) 40 MG tablet TAKE ONE TABLET BY  MOUTH DAILY FOR CHOLESTEROL 90 tablet 3   simethicone (GAS-X) 80 MG chewable tablet Chew 1 tablet (80 mg total) by mouth 4 (four) times daily as needed for flatulence. 100 tablet 2   Thiamine HCl (VITAMIN B-1) 250 MG tablet Take 250 mg by mouth daily.     vitamin E 200 UNIT capsule Take 200 Units by mouth 2 (two) times daily.     lidocaine (XYLOCAINE) 2 % solution Use as directed 15 mLs in the mouth or throat as needed for mouth pain. 100 mL 1   pantoprazole (PROTONIX) 40 MG tablet Take 1 tablet (40 mg total) by mouth 2 (two) times daily before a meal for 28 days. Twice daily for 2-weeks.  Daily for 2-weeks. 60 tablet 0   No current facility-administered medications for this visit.    Physical Exam:     BP (!) 160/88   Pulse Marland Kitchen)  59   Ht '6\' 1"'$  (1.854 m)   Wt 217 lb (98.4 kg)   BMI 28.63 kg/m   GENERAL:  Pleasant male in NAD PSYCH: : Cooperative, normal affect ABDOMEN:  Nondistended, soft, nontender.  Surgical scars well-healed SKIN:  turgor, no lesions seen Musculoskeletal:  Normal muscle tone, normal strength NEURO: Alert and oriented x 3, no focal neurologic deficits   IMPRESSION and PLAN:    1) History of GERD now s/p cTIF 06/2022 2) Hiatal hernia repair 3) History of fundoplication Doing very well after cTIF in 06/2022.  Tolerating postoperative diet without issue and very eager to advance his diet and start exercising again.  - Start weaning Protonix.  Decrease to 40 mg daily x 2 weeks and if doing well, changed to 40 mg qod continue completely - Continue advancing diet per protocol.  Should be able to advance to full diet by the end of next week (6 weeks postop) - Encouraged him to start exercising again.  Hold off on reintroducing core abdominal exercises until he is able to be seen by Dr. Redmond Pulling next week - Follow-up with Dr. Redmond Pulling next week as scheduled in surgical clinic - Can follow-up with me in 6 months or sooner as needed  4) History of duodenal adenoma - Will  release his continued GI care to Dr. Hilarie Fredrickson again at 44-monthfollow-up and can continue surveillance as appropriate  5) History of colon polyps - Colonoscopy 11/2019 with 2 adenomatous polyps with recommendation to repeat in 3 years - Repeat colonoscopy later this year  I spent 30 minutes of time, including in depth chart review, independent review of results as outlined above, communicating results with the patient directly, face-to-face time with the patient, coordinating care, and ordering studies and medications as appropriate, and documentation.   VT[place]a,DO, FACG 07/22/2022, 3:00 PM

## 2022-07-26 ENCOUNTER — Ambulatory Visit: Payer: BC Managed Care – PPO | Admitting: Internal Medicine

## 2022-07-26 NOTE — Telephone Encounter (Signed)
I called the pt in response to his my chart message suggesting he has had some left arm problems... he says it has been bothering him for several weeks. He says it is from his elbow to his fingers with some numbness in a few of his fingers..he does not have chest pain. He walks on the treadmill and does not have any symptoms or difficulties.. he has a h/o carpal tunnel syndrome in the same hand.   He will call his PCP... he will monitor his symptoms and if he develops any further or worsening symptoms he will call us asap.  I will forward to Dr Ellyn Hack for his review.

## 2022-07-26 NOTE — Telephone Encounter (Signed)
Left arm pain sounds more musculoskeletal may be even nerve related pain or impingement.  For the BP issue certainly if he is not taking the amlodipine that would explain the blood pressures being higher.  Benicar is olmesartan-maybe he has out on his list.  If not, we can probably get him back on that as well but this is some of his blood pressures look like on the amlodipine.  If pressures are still high, he probably just needs to be seen by APP or pharmacist team to adjust meds back, unless his PCP can do it.   [name], MD

## 2022-08-03 DIAGNOSIS — C44519 Basal cell carcinoma of skin of other part of trunk: Secondary | ICD-10-CM | POA: Diagnosis not present

## 2022-08-03 DIAGNOSIS — D225 Melanocytic nevi of trunk: Secondary | ICD-10-CM | POA: Diagnosis not present

## 2022-08-03 DIAGNOSIS — D2262 Melanocytic nevi of left upper limb, including shoulder: Secondary | ICD-10-CM | POA: Diagnosis not present

## 2022-08-03 DIAGNOSIS — D2261 Melanocytic nevi of right upper limb, including shoulder: Secondary | ICD-10-CM | POA: Diagnosis not present

## 2022-08-03 DIAGNOSIS — L738 Other specified follicular disorders: Secondary | ICD-10-CM | POA: Diagnosis not present

## 2022-08-03 DIAGNOSIS — D485 Neoplasm of uncertain behavior of skin: Secondary | ICD-10-CM | POA: Diagnosis not present

## 2022-08-03 DIAGNOSIS — L814 Other melanin hyperpigmentation: Secondary | ICD-10-CM | POA: Diagnosis not present

## 2022-08-07 ENCOUNTER — Encounter: Payer: Self-pay | Admitting: Internal Medicine

## 2022-08-08 NOTE — Telephone Encounter (Signed)
Glad to hear about the blood pressures being better.  Foot and arm pain are probably not good thinks me to try to manage.  [name], MD

## 2022-08-17 NOTE — Progress Notes (Signed)
Cardiology Clinic Note   Patient Name: Nathaniel Gill Date of Encounter: 08/18/2022  Primary Care Provider:  Unk Pinto, MD Primary Cardiologist:  [name], MD  Patient Profile    Nathaniel Gill 72 year old male presents to the clinic today for follow-up evaluation of his hypertension and coronary artery disease.  Past Medical History    Past Medical History:  Diagnosis Date   Aortic atherosclerosis ([place])    Arthritis    CAD (coronary artery disease) 07/13/2021   Cor Ca++ Score 837.  Mixed mod plaque prox RCA(50 to 69%).  Mild Ca++ plaque mid RCA (25-49%).  Minimal Ca++ plaque prox LCx (0-24%).  Mid LCx CA++ plaque (50 to 69%), Prox OM1 CA++ plaque (50-69%).  Prox LAD mild to 0-24%. LM  mild 0-24% Ca++  plaque. => CT FFR + for distal LCx (small caliber)  Negative findings in the LAD, OM1 and  and RCA. =>  Would not recommend catheterization.   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 Hilarie Fredrickson:  Low-grade of narrowing Schatzki ring. Dilated to 18 mm with balloon. - 3 cm hiatal hernia. - Normal stomach. 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    Gallstone    Gastroparesis    GERD (gastroesophageal reflux disease)    Hiatal hernia    History of colon polyps    Hyperlipidemia    Hypertension    No sign of renal artery stenosis   IBS (irritable bowel syndrome)    Inguinal hernia, bilateral    Pre-diabetes    RBBB    Schatzki's ring    Vertigo    Vitamin D deficiency    Past Surgical History:  Procedure Laterality Date   ABDOMINAL SURGERY  1956   Related to bleeding episode.    BIOPSY  01/27/2020   Procedure: BIOPSY;  Surgeon: Rush Landmark Telford Nab., MD;  Location: [place];  Service: Gastroenterology;;   BIOPSY   01/21/2021   Procedure: BIOPSY;  Surgeon: Irving Copas., MD;  Location: [place];  Service: Gastroenterology;;   CARDIAC CATHETERIZATION  2005   no intervention per patient   CARPAL TUNNEL RELEASE Left 03/06/2020   Dr. Amedeo Plenty   COCHLEAR IMPLANT Bilateral    COLONOSCOPY     several - Last one 11/22/19   CYST REMOVAL HAND Left 1975   [place]   ENDOSCOPIC MUCOSAL RESECTION N/A 01/27/2020   Procedure: ENDOSCOPIC MUCOSAL RESECTION;  Surgeon: Irving Copas., MD;  Location: [place];  Service: Gastroenterology;  Laterality: N/A;   ENTEROSCOPY N/A 01/27/2020   Procedure: ENTEROSCOPY;  Surgeon: Rush Landmark Telford Nab., MD;  Location: [place];  Service: Gastroenterology;  Laterality: N/A;   ESOPHAGEAL MANOMETRY N/A 05/18/2022   Procedure: ESOPHAGEAL MANOMETRY (EM);  Surgeon: Jerene Bears, MD;  Location: [facility];  Service: Gastroenterology;  Laterality: N/A;   ESOPHAGOGASTRODUODENOSCOPY N/A 06/17/2022   Procedure: ESOPHAGOGASTRODUODENOSCOPY (EGD);  Surgeon: Lavena Bullion, DO;  Location: [facility];  Service: Gastroenterology;  Laterality: N/A;   ESOPHAGOGASTRODUODENOSCOPY (EGD) WITH PROPOFOL N/A 01/21/2021   Procedure: ESOPHAGOGASTRODUODENOSCOPY (EGD) WITH PROPOFOL;  Surgeon: Rush Landmark Telford Nab., MD;  Location: [place];  Service: Gastroenterology;  Laterality: N/A;   HEMOSTASIS CLIP PLACEMENT  01/27/2020   Procedure: HEMOSTASIS CLIP PLACEMENT;  Surgeon: Irving Copas.,  MD;  Location: [place];  Service: Gastroenterology;;   HIATAL HERNIA REPAIR N/A 06/17/2022   Procedure: LAPAROSCOPIC REPAIR OF HIATAL HERNIA WITH TIF;  Surgeon: Greer Pickerel, MD;  Location: [facility];  Service: General;  Laterality: N/A;   SKIN LESION EXCISION     STAPEDECTOMY Bilateral    x 2 - 10 yrs apart - No MRIs per patient.   SUBMUCOSAL LIFTING INJECTION  01/27/2020   Procedure: SUBMUCOSAL LIFTING INJECTION;  Surgeon: Rush Landmark Telford Nab., MD;  Location: [place];   Service: Gastroenterology;;   SUBMUCOSAL TATTOO INJECTION  01/27/2020   Procedure: SUBMUCOSAL TATTOO INJECTION;  Surgeon: Irving Copas., MD;  Location: [place];  Service: Gastroenterology;;   TRANSORAL INCISIONLESS FUNDOPLICATION N/A [id number]   Procedure: TRANSORAL INCISIONLESS FUNDOPLICATION;  Surgeon: Lavena Bullion, DO;  Location: [facility];  Service: Gastroenterology;  Laterality: N/A;   TRANSTHORACIC ECHOCARDIOGRAM  07/2017   Normal LV size and function.  EF 55-60%.  No RWMA.  Basal septal hypertrophy, but no sign of significant hypertensive heart disease.  Only GR 1 DD.   TRIGGER FINGER RELEASE Right 2014   Thumb   UPPER GI ENDOSCOPY     several - last one 11/22/2019   WISDOM TOOTH EXTRACTION      Allergies  Allergies  Allergen Reactions   Other     Pt has cochlear implants- Cannot have MRI due to one of cochlear implants is metal.     History of Present Illness    Nathaniel Gill has a PMH of hyperlipidemia, diabetes, HTN, right bundle branch block, mild-moderate nonobstructive CAD with positive FFR on coronary CT, cochlear implant, esophageal stricture, GERD, dilated aorta measuring 40 mm 1/23, and mild carotid artery disease 5/23.  He was seen in follow-up by Dr. Ellyn Hack 08/18/2021.  During that time he was doing well.  His echocardiogram 1/19 showed an EF of 55-60%, G1 DD.  His coronary CT 1/23 showed mild-moderate coronary artery disease with dilated aorta measuring 40 mm.  His FFR suggested functionally significant lesion in his distal circumflex.  However, it was noted to be a small vessel.  The remainder of his FFR suggested nonobstructive CAD.  Due to absence of symptoms medical management was felt to be the best course of treatment.  He contacted the cardiology clinic with near syncope 5/23.  It was felt that it was a vasovagal.  Carotid duplex showed mild carotid stenosis.  He was also noted to have some GI symptoms.  He was evaluated for preoperative  cardiac risk assessment by Sharrell Ku, PA-C on 06/07/2022.  He was planning to undergo laparoscopic hiatal hernia repair and TIF.  He reported no new symptoms.  His RCRI was noted to be 0.9% and he was able to achieve greater than 4 METS of activity.  He presents to the clinic today for follow-up evaluation and states he feels well.  He remains very active walking at his job and doing exercise at home.  He walks in excess of 3.5 miles daily when he is at work.  On the days when he is not at work or at least a mile walks on the treadmill.  He enjoys going to [employer].  He tolerated his recent hernia surgery well.  He reports that he lost nearly 20 pounds due to his liquid diet postoperatively.  He has since started to gain some weight back.  His EKG today shows sinus bradycardia right bundle branch block 51 bpm.  His blood pressure  today is 126/72.  His main complaint today is with some right foot pain in his arch.  He will follow-up with a foot specialist later this week.  He has 2+ posterior tibial and dorsal pedis pulses.  He does also note some occasional dizziness with standing.  His LDL cholesterol 06/09/2022 was 61.  I will give the salty 6 diet sheet, maintain p.o. hydration, and will plan follow-up in 12 months.  Today he denies chest pain, shortness of breath, lower extremity edema, fatigue, palpitations, melena, hematuria, hemoptysis, diaphoresis, weakness, presyncope, syncope, orthopnea, and PND.    Home Medications    Prior to Admission medications   Medication Sig Start Date End Date Taking? Authorizing Provider  acetaminophen-codeine 120-12 MG/5ML solution Take 15 mLs by mouth every 4 (four) hours as needed for moderate pain. 06/18/22   Terrilee Croak, MD  amLODipine (NORVASC) 10 MG tablet TAKE ONE TABLET BY MOUTH DAILY FOR BLOOD PRESSURE 09/17/21   Liane Comber, NP  Ascorbic Acid (VITAMIN C) 1000 MG tablet Take 1,000 mg by mouth in the morning and at bedtime.    [provider]  aspirin 81 MG tablet Take 1 tablet (81 mg total) by mouth daily. 06/20/22   Mansouraty, Telford Nab., MD  BIOTIN PO Take 1 tablet by mouth daily.    [provider]  blood glucose meter kit and supplies KIT Dispense based on patient and insurance preference. Use up to four times daily as directed. 06/23/22   Alycia Rossetti, NP  carvedilol (COREG) 12.5 MG tablet TAKE ONE TABLET BY MOUTH TWICE A DAY 04/25/22   Leonie Man, MD  docusate sodium (COLACE) 100 MG capsule Take 1 capsule (100 mg total) by mouth 2 (two) times daily. 06/18/22 06/18/23  Mansouraty, Telford Nab., MD  ezetimibe (ZETIA) 10 MG tablet Take 1 tablet (10 mg total) by mouth daily. 06/20/22   Alycia Rossetti, NP  gabapentin (NEURONTIN) 300 MG capsule TAKE ONE CAPSULE BY MOUTH THREE TIMES A DAY AS NEEDED 06/09/22   Alycia Rossetti, NP  lidocaine (XYLOCAINE) 2 % solution Use as directed 15 mLs in the mouth or throat as needed for mouth pain. 06/18/22   Mansouraty, Telford Nab., MD  Magnesium 400 MG TABS Take 400 mg by mouth daily.    [provider]  metoCLOPramide (REGLAN) 10 MG tablet Take 1 tablet (10 mg total) by mouth every 6 (six) hours as needed for nausea. 06/18/22 06/18/23  Mansouraty, Telford Nab., MD  olmesartan-hydrochlorothiazide (BENICAR HCT) 40-25 MG tablet TAKE ONE TABLET BY MOUTH DAILY FOR BLOOD PRESSURE 09/17/21   Liane Comber, NP  ondansetron (ZOFRAN) 4 MG tablet Take 1 tablet (4 mg total) by mouth every 6 (six) hours as needed for nausea or vomiting. 06/18/22   Mansouraty, Telford Nab., MD  pantoprazole (PROTONIX) 40 MG tablet Take 1 tablet (40 mg total) by mouth 2 (two) times daily before a meal for 28 days. Twice daily for 2-weeks.  Daily for 2-weeks. 06/18/22 07/16/22  Mansouraty, Telford Nab., MD  rosuvastatin (CRESTOR) 40 MG tablet TAKE ONE TABLET BY MOUTH DAILY FOR CHOLESTEROL 06/07/22   Alycia Rossetti, NP  simethicone (GAS-X) 80 MG chewable tablet Chew 1 tablet (80 mg  total) by mouth 4 (four) times daily as needed for flatulence. 06/18/22 06/18/23  Mansouraty, Telford Nab., MD  Thiamine HCl (VITAMIN B-1) 250 MG tablet Take 250 mg by mouth daily.    [provider]  vitamin E 200 UNIT capsule Take 200 Units by  mouth 2 (two) times daily.    [provider]    Family History    Family History  Problem Relation Age of Onset   Heart disease Mother        Atrial flutter   COPD Mother    Heart failure Mother    Kidney disease Father    Arrhythmia Brother    Heart attack Brother    Hypertension Brother    Hyperlipidemia Brother    Hypertension Brother    Hyperlipidemia Brother    Esophageal cancer Neg Hx    Colon cancer Neg Hx    He indicated that his mother is deceased. He indicated that his father is deceased. He indicated that all of his four brothers are alive. He indicated that his maternal grandmother is deceased. He indicated that his maternal grandfather is deceased. He indicated that his paternal grandmother is deceased. He indicated that his paternal grandfather is deceased. He indicated that his son is alive. He indicated that the status of his neg hx is unknown.  Social History    Social History   Socioeconomic History   Marital status: Married    Spouse name: Not on file   Number of children: Not on file   Years of education: Not on file   Highest education level: Not on file  Occupational History   Not on file  Tobacco Use   Smoking status: Never   Smokeless tobacco: Never  Vaping Use   Vaping Use: Never used  Substance and Sexual Activity   Alcohol use: Not Currently    Comment: occasional wine   Drug use: No    Comment: In 90s - cocaine, marijuana   Sexual activity: Yes    Partners: Female    Birth control/protection: None  Other Topics Concern   Not on file  Social History Narrative   Not on file   Social Determinants of Health   Financial Resource Strain: Not on file  Food Insecurity: Not on file   Transportation Needs: Not on file  Physical Activity: Insufficiently Active (05/20/2019)   Exercise Vital Sign    Days of Exercise per Week: 7 days    Minutes of Exercise per Session: 20 min  Stress: No Stress Concern Present (05/20/2019)   [place]    Feeling of Stress : Only a little  Social Connections: Not on file  Intimate Partner Violence: Not on file     Review of Systems    General:  No chills, fever, night sweats or weight changes.  Cardiovascular:  No chest pain, dyspnea on exertion, edema, orthopnea, palpitations, paroxysmal nocturnal dyspnea. Dermatological: No rash, lesions/masses Respiratory: No cough, dyspnea Urologic: No hematuria, dysuria Abdominal:   No nausea, vomiting, diarrhea, bright red blood per rectum, melena, or hematemesis Neurologic:  No visual changes, wkns, changes in mental status. All other systems reviewed and are otherwise negative except as noted above.  Physical Exam    VS:  BP 126/72 (BP Location: Left Arm, Patient Position: Sitting, Cuff Size: Large)   Pulse (!) 51   Ht 6' 1"$  (1.854 m)   Wt 221 lb 12.8 oz (100.6 kg)   BMI 29.26 kg/m  , BMI Body mass index is 29.26 kg/m. GEN: Well nourished, well developed, in no acute distress. HEENT: normal. Neck: Supple, no JVD, carotid bruits, or masses. Cardiac: RRR, no murmurs, rubs, or gallops. No clubbing, cyanosis, edema.  Radials/DP/PT 2+ and equal bilaterally.  Respiratory:  Respirations regular and unlabored, clear to auscultation bilaterally. GI: Soft, nontender, nondistended, BS + x 4. MS: no deformity or atrophy. Skin: warm and dry, no rash. Neuro:  Strength and sensation are intact. Psych: Normal affect.  Accessory Clinical Findings    Recent Labs: 06/09/2022: Magnesium 2.2; TSH 2.99 06/23/2022: ALT 34; BUN 31; Creat 1.14; Hemoglobin 14.3; Platelets 353; Potassium 4.6; Sodium 138   Recent Lipid Panel    Component  Value Date/Time   CHOL 131 06/09/2022 0000   TRIG 102 06/09/2022 0000   HDL 51 06/09/2022 0000   CHOLHDL 2.6 06/09/2022 0000   LDLCALC 61 06/09/2022 0000         ECG personally reviewed by me today-sinus bradycardia right bundle branch block 51 bpm- No acute changes  Coronary CTA 07/13/2021  FINDINGS: A 100 kV prospective scan was triggered in the descending thoracic aorta at 111 HU's. Axial non-contrast 3 mm slices were carried out through the heart. The data set was analyzed on a dedicated work station and scored using the [place]. Gantry rotation speed was 250 msecs and collimation was .6 mm. 0.8 mg of sl NTG was given. The 3D data set was reconstructed in 5% intervals of the 35-75 % of the R-R cycle. Phases were analyzed on a dedicated work station using MPR, MIP and VRT modes. The patient received 80 cc of contrast.   Coronary Arteries:  Normal coronary origin.  Right dominance.   RCA is a large dominant artery that gives rise to PDA and PLA. There is mixed plaque in the proximal RCA causes 50-69% stenosis. There is calcified plaque in the mid RCA causing 25-49% stenosis   Left main is a large artery that gives rise to LAD and LCX arteries. There is calcified plaque in the left main causing 0-24% stenosis   LAD is a large vessel. There is calcified plaque in the proximal LAD causing 0-24% stenosis. There is calcified plaque in the mid LAD causing 0-24% stenosis.   LCX is a non-dominant artery that gives rise to one large OM1 branch. There is calcified plaque in the proximal LCX causing 0-24% stenosis. There is calcified plaque in the mid LCX causing 50-69% stenosis. There is calcified plaque in the proximal OM1 causing 50-69% stenosis   There is calcified plaque in ramus causing 0-24% stenosis   Other findings:   Left Ventricle: Normal size   Left Atrium: Moderate enlargement   Pulmonary Veins: Normal configuration   Right Ventricle: Mild  enlargement   Right Atrium: Mild enlargement   Cardiac valves: Mild AV calcifications.  Mild MAC   Thoracic aorta: Dilated ascending aorta measuring 54m   Pulmonary Arteries: Normal size   Systemic Veins: Normal drainage   Pericardium: Normal thickness   IMPRESSION: 1. Coronary calcium score of 837. This was 85th percentile for age and sex matched control.   2. Normal coronary origin with right dominance.   3. There is mixed plaque in the proximal RCA causing moderate (50-69%) stenosis. There is calcified plaque in the mid RCA causing mild (25-49%) stenosis   4. There is calcified plaque in the proximal LCX causing mild (0-24%) stenosis. There is calcified plaque in the mid LCX causing moderate (50-69%) stenosis. There is calcified plaque in the proximal OM1 causing moderate (50-69%) stenosis   5. There is calcified plaque in the proximal and mid LAD causing mild (0-24%) stenosis.   6. There is calcified plaque in the left main causing mild (0-24%) stenosis   7.  Mild AV calcifications (AV calcium score 477)   8.  Dilated ascending aorta measuring 50m   9.  Study will be sent for CT FFR   CAD-RADS 3. Moderate stenosis. Consider symptom-guided anti-ischemic pharmacotherapy as well as risk factor modification per guideline directed care. Additional analysis with CT FFR will be submitted.     Electronically Signed   By: COswaldo MilianM.D.   On: 07/13/2021 11:21   FINDINGS: FFRct analysis was performed on the original cardiac CT angiogram dataset. Diagrammatic representation of the FFRct analysis is provided in a separate PDF document in PACS. This dictation was created using the PDF document and an interactive 3D model of the results. 3D model is not available in the EMR/PACS. Normal FFR range is >0.80.   1. Left Main:No significant lesions   2. LAD: No significant lesions   3. LCX: CT FFR 0.93 across lesion in mid LCX, drops to 0.59 in distal  LCX (small vessel). CTFFE 0.93 across lesion in mid OM1, decreases to 0.80 in distal LCX   4. Ramus: No significant lesions   5. RCA: CTFFR 0.96 across lesion in proximal RCA, and 0.92 across lesion in mid RCA   IMPRESSION: CT FFR suggests functionally significant lesion in distal LCX, though small vessel. Otherwise CT FFR suggests nonobstructive CAD.     Electronically Signed   By: COswaldo MilianM.D.   On: 07/13/2021 22:01    Assessment & Plan   1.  Resistant hypertension-BP today 126/ 72 Continue amlodipine, carvedilol, olmesartan, HCTZ Heart healthy low-sodium diet-salty 6 given Increase physical activity as tolerated Maintain blood pressure log  Elevated coronary calcium score-coronary CTA noted mild-moderate diffuse plaque.  He was noted to have distal circumflex that was positive but noted to be a small vessel.  There was no indication for cardiac catheterization due to lack of symptoms. Continue rosuvastatin, ezetimibe  Hyperlipidemia-LDL 61 on 06/09/22 Continue rosuvastatin, ezetimibe Heart healthy low-sodium high-fiber diet Increase physical activity as tolerated  RBBB-heart rate today 51bpm.  Denies lightheadedness, presyncope and syncope. Continue to monitor  Disposition: Follow-up with Dr. HEllyn Hackin 9-12 months.   JJossie Ng Jase Reep NP-C     08/18/2022, 3:47 PM [id number] Office [phone] [phone]   I spent 14 minutes examining this patient, reviewing medications, and using patient centered shared decision making involving her cardiac care.  Prior to her visit I spent greater than 20 minutes reviewing her past medical history,  medications, and prior cardiac tests.

## 2022-08-18 ENCOUNTER — Encounter: Payer: Self-pay | Admitting: General Practice

## 2022-08-18 ENCOUNTER — Ambulatory Visit: Payer: Medicare Other | Attending: General Practice | Admitting: General Practice

## 2022-08-18 VITALS — BP 126/72 | HR 51 | Ht 73.0 in | Wt 221.8 lb

## 2022-08-18 DIAGNOSIS — I1A0 Resistant hypertension: Secondary | ICD-10-CM

## 2022-08-18 DIAGNOSIS — E785 Hyperlipidemia, unspecified: Secondary | ICD-10-CM

## 2022-08-18 DIAGNOSIS — I4510 Unspecified right bundle-branch block: Secondary | ICD-10-CM | POA: Diagnosis not present

## 2022-08-18 DIAGNOSIS — R931 Abnormal findings on diagnostic imaging of heart and coronary circulation: Secondary | ICD-10-CM

## 2022-08-18 DIAGNOSIS — E1169 Type 2 diabetes mellitus with other specified complication: Secondary | ICD-10-CM

## 2022-08-18 NOTE — Patient Instructions (Signed)
Medication Instructions:  MAY STOP VITAMIN E *If you need a refill on your cardiac medications before your next appointment, please call your pharmacy*  Lab Work: NONE If you have labs (blood work) drawn today and your tests are completely normal, you will receive your results only by: [place] (if you have MyChart) OR A paper copy in the mail If you have any lab test that is abnormal or we need to change your treatment, we will call you to review the results.  Testing/Procedures: NONE  Follow-Up: At [facility], you and your health needs are our priority.  As part of our continuing mission to provide you with exceptional heart care, we have created designated Provider Care Teams.  These Care Teams include your primary Cardiologist (physician) and Advanced Practice Providers (APPs -  Physician Assistants and Nurse Practitioners) who all work together to provide you with the care you need, when you need it.  Your next appointment:   12 month(s)  Provider:   [name], MD     Other Instructions MAINTAIN PHYSICAL ACTIVITY  PLEASE READ AND FOLLOW ATTACHED  SALTY 6

## 2022-08-19 DIAGNOSIS — M79671 Pain in right foot: Secondary | ICD-10-CM | POA: Diagnosis not present

## 2022-08-31 DIAGNOSIS — M25562 Pain in left knee: Secondary | ICD-10-CM | POA: Diagnosis not present

## 2022-09-05 ENCOUNTER — Encounter: Payer: Self-pay | Admitting: Gastroenterology

## 2022-09-08 IMAGING — CT CT HEART MORP W/ CTA COR W/ SCORE W/ CA W/CM &/OR W/O CM
1 series · 8 of 10 positions shown, 10 images · non-contrast
Comparison: Cardiac CT 06/04/2021.
COMPARISON: Cardiac CT 06/04/2021.

Addendum:
EXAM:
OVER-READ INTERPRETATION  CT CHEST

The following report is an over-read performed by radiologist Dr.
Rinosa Tiwary [REDACTED] on 07/13/2021. This
over-read does not include interpretation of cardiac or coronary
anatomy or pathology. The coronary calcium score/coronary CTA
interpretation by the cardiologist is attached.
CLINICAL DATA: 70M with elevated calcium score
Cardiac/Coronary CTA
TECHNIQUE: The patient was scanned on a [employer] Force scanner.

[Series 1210: coronaries · 8 of 10 slices shown, 10 images]
[im 2/10  vessel]
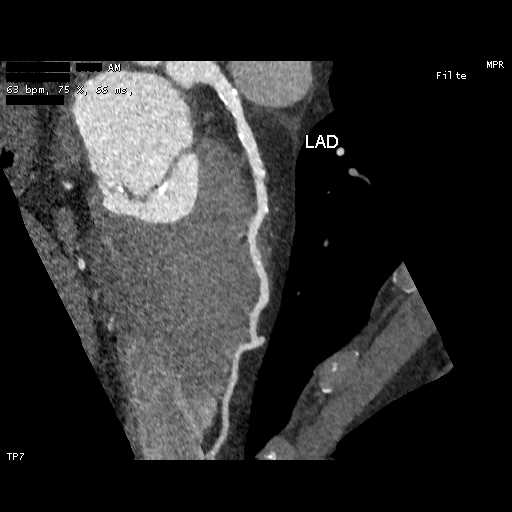
[im 2/10  lung]
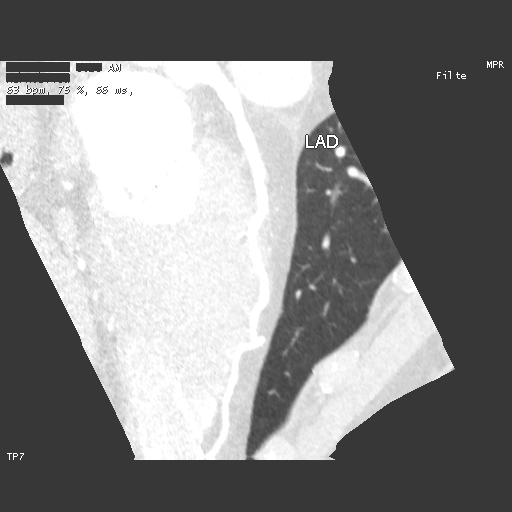
[im 3/10  vessel]
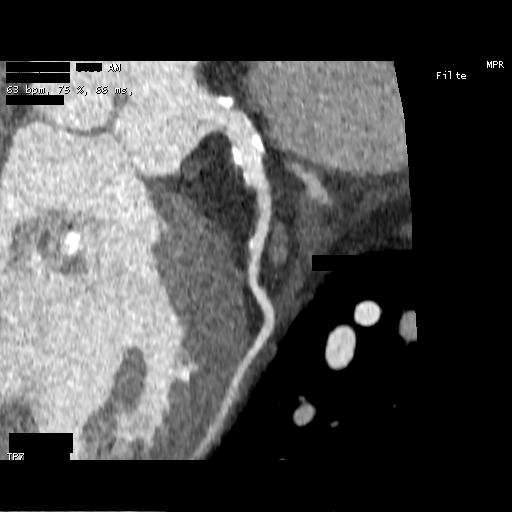
[im 4/10  vessel]
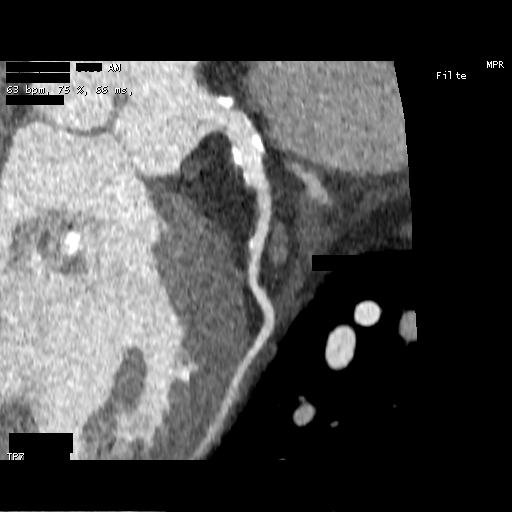
[im 5/10  vessel]
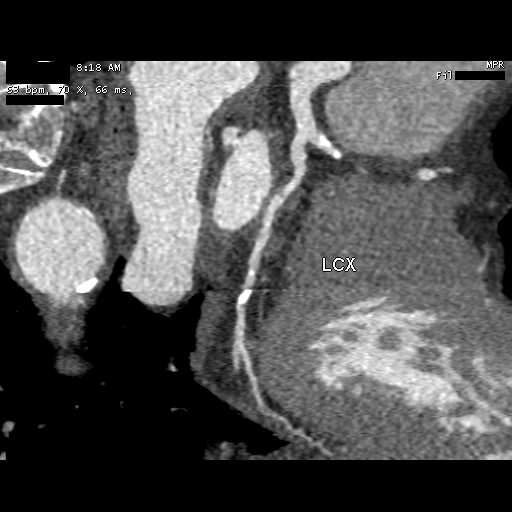
[im 6/10  vessel]
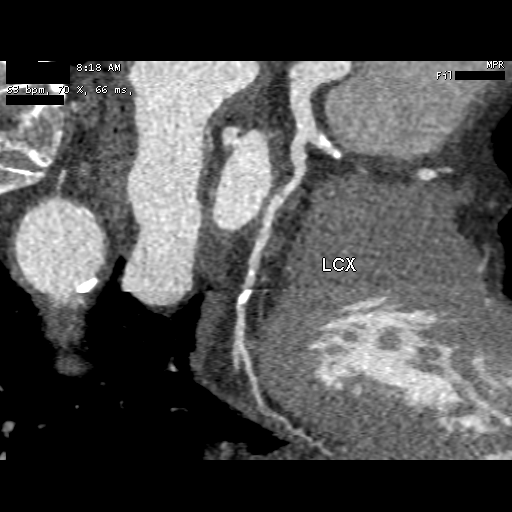
[im 6/10  lung]
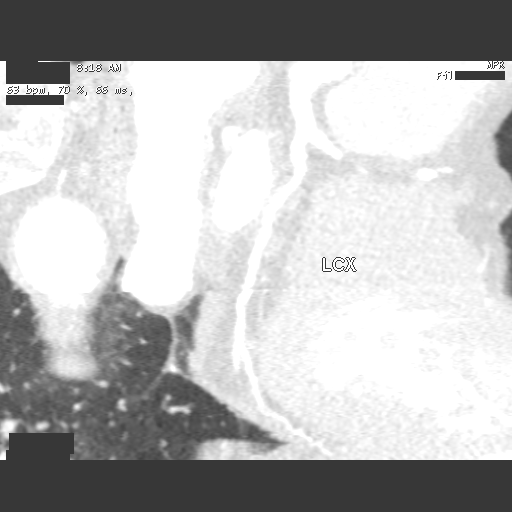
[im 7/10  vessel]
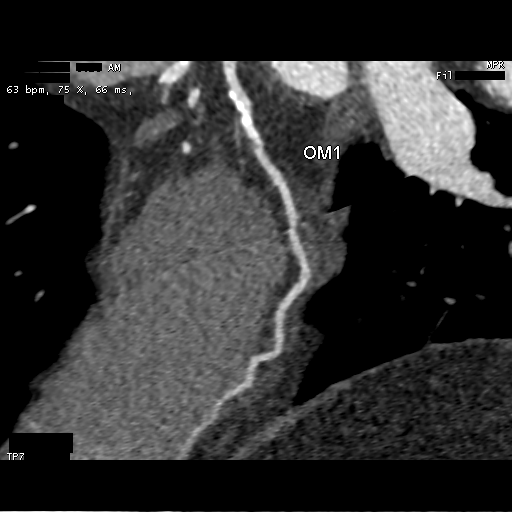
[im 8/10  vessel]
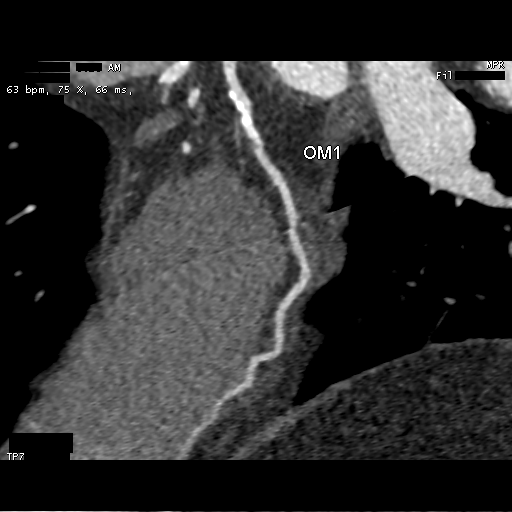
[im 9/10  vessel]
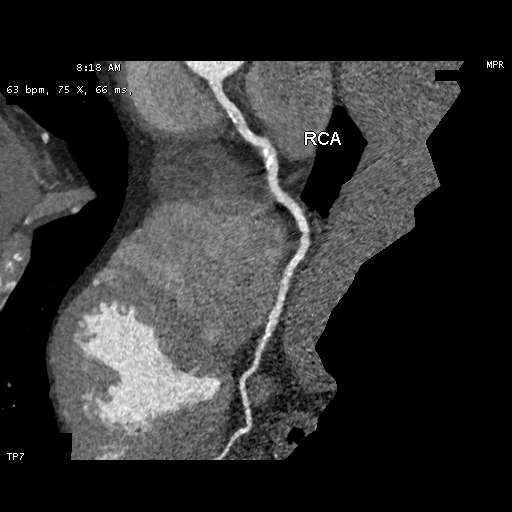

[8 of 10 positions shown; findings below may reference images not displayed]

FINDINGS: Atherosclerotic calcifications are noted in the thoracic aorta.
Within the visualized portions of the thorax there are no suspicious
appearing pulmonary nodules or masses, there is no acute
consolidative airspace disease, no pleural effusions, no
pneumothorax and no lymphadenopathy. Visualized portions of the
upper abdomen are unremarkable. There are no aggressive appearing
lytic or blastic lesions noted in the visualized portions of the
skeleton.
IMPRESSION: 1.  Aortic Atherosclerosis ([id number]).
FINDINGS: A 100 kV prospective scan was triggered in the descending thoracic
aorta at 111 HU's. Axial non-contrast 3 mm slices were carried out
through the heart. The data set was analyzed on a dedicated work
station and scored using the Agatson method. Gantry rotation speed
was 250 msecs and collimation was .6 mm. 0.8 mg of sl NTG was given.
The 3D data set was reconstructed in 5% intervals of the 35-75 % of
the R-R cycle. Phases were analyzed on a dedicated work station
using MPR, MIP and VRT modes. The patient received 80 cc of
contrast.

Coronary Arteries:  Normal coronary origin.  Right dominance.

RCA is a large dominant artery that gives rise to PDA and PLA. There
is mixed plaque in the proximal RCA causes 50-69% stenosis. There is
calcified plaque in the mid RCA causing 25-49% stenosis

Left main is a large artery that gives rise to LAD and LCX arteries.
There is calcified plaque in the left main causing 0-24% stenosis

LAD is a large vessel. There is calcified plaque in the proximal LAD
causing 0-24% stenosis. There is calcified plaque in the mid LAD
causing 0-24% stenosis.

LCX is a non-dominant artery that gives rise to one large OM1
branch. There is calcified plaque in the proximal LCX causing 0-24%
stenosis. There is calcified plaque in the mid LCX causing 50-69%
stenosis. There is calcified plaque in the proximal OM1 causing
50-69% stenosis

There is calcified plaque in ramus causing 0-24% stenosis

Other findings:

Left Ventricle: Normal size

Left Atrium: Moderate enlargement

Pulmonary Veins: Normal configuration

Right Ventricle: Mild enlargement

Right Atrium: Mild enlargement

Cardiac valves: Mild AV calcifications.  Mild YOICE

Thoracic aorta: Dilated ascending aorta measuring 40mm

Pulmonary Arteries: Normal size

Systemic Veins: Normal drainage

Pericardium: Normal thickness
IMPRESSION: 1. Coronary calcium score of 837. This was 85th percentile for age
and sex matched control.

2. Normal coronary origin with right dominance.

3. There is mixed plaque in the proximal RCA causing moderate
(50-69%) stenosis. There is calcified plaque in the mid RCA causing
mild (25-49%) stenosis

4. There is calcified plaque in the proximal LCX causing mild
(0-24%) stenosis. There is calcified plaque in the mid LCX causing
moderate (50-69%) stenosis. There is calcified plaque in the
proximal OM1 causing moderate (50-69%) stenosis

5. There is calcified plaque in the proximal and mid LAD causing
mild (0-24%) stenosis.

6. There is calcified plaque in the left main causing mild (0-24%)
stenosis

7.  Mild AV calcifications (AV calcium score 477)

8.  Dilated ascending aorta measuring 40mm

9.  Study will be sent for CT FFR

CAD-RADS 3. Moderate stenosis. Consider symptom-guided anti-ischemic
pharmacotherapy as well as risk factor modification per guideline
directed care. Additional analysis with CT FFR will be submitted.

*** End of Addendum ***
EXAM:
OVER-READ INTERPRETATION  CT CHEST

The following report is an over-read performed by radiologist Dr.
Rinosa Tiwary [REDACTED] on 07/13/2021. This
over-read does not include interpretation of cardiac or coronary
anatomy or pathology. The coronary calcium score/coronary CTA
interpretation by the cardiologist is attached.
FINDINGS: Atherosclerotic calcifications are noted in the thoracic aorta.
Within the visualized portions of the thorax there are no suspicious
appearing pulmonary nodules or masses, there is no acute
consolidative airspace disease, no pleural effusions, no
pneumothorax and no lymphadenopathy. Visualized portions of the
upper abdomen are unremarkable. There are no aggressive appearing
lytic or blastic lesions noted in the visualized portions of the
skeleton.
IMPRESSION: 1.  Aortic Atherosclerosis ([id number]).

## 2022-09-08 NOTE — Progress Notes (Signed)
Future Appointments  Date Time Provider Department  09/09/2022 10:30 AM Unk Pinto, MD [facility]  01/03/2023                       [facility]  9:00 AM Darrol Jump, NP [facility]  06/12/2023                      CPE   10:00 AM Alycia Rossetti, NP [facility]    History of Present Illness:       This very nice 72 y.o.  MWM  presents for quarterly  follow up with HTN, HLD, Pre-Diabetes, GERD  and Vitamin D Deficiency.  CXR in 2020 showed Aortic Atherosclerosis.         Patient is treated for HTN (2019)  & BP has been controlled at home. Today's BP is at goal -  110/70. Patient has had no complaints of any cardiac type chest pain, palpitations, dyspnea Vertell Limber /PND, dizziness, claudication or dependent edema.        Hyperlipidemia (2018)  is controlled with diet & Rosuvastatin /Ezetimibe . Patient denies myalgias or other med SE's. Last Lipids were at  goal :  Lab Results  Component Value Date   CHOL 131 06/09/2022   HDL 51 06/09/2022   LDLCALC 61 06/09/2022   TRIG 102 06/09/2022   CHOLHDL 2.6 06/09/2022      Also, the patient has history of  PreDiabetes  (A1c 6.1% /2018)   and    T2_NIDDM   (A1c 6.5% in Apr 2020)  w/CKD2 (GFR 70) and patient has attempted to control by diet .   He  has had no symptoms of reactive hypoglycemia, diabetic polys, paresthesias or visual blurring.  Last A1c was not at  goal :  Lab Results  Component Value Date   HGBA1C 6.7 (H) 06/09/2022                                                           Further, the patient also has history of Vitamin D Deficiency ("19" /2018)  and supplements vitamin D . Last vitamin D was still very low :   Lab Results  Component Value Date   VD25OH 32 06/09/2022      Current Outpatient Medications on File Prior to Visit  Medication Sig   amLODipine (NORVASC) 10 MG tablet TAKE ONE TABLET BY MOUTH DAILY FOR BLOOD PRESSURE   Ascorbic Acid (VITAMIN C) 1000 MG tablet Take 1,000 mg by mouth in the morning  and at bedtime.   aspirin 81 MG tablet Take 1 tablet (81 mg total) by mouth daily.   carvedilol (COREG) 12.5 MG tablet TAKE ONE TABLET TWICE A DAY   ezetimibe (ZETIA) 10 MG tablet Take 1 tablet  daily.   gabapentin (NEURONTIN) 300 MG capsule TAKE ONE CAP THREE TIMES A DAY AS NEEDED   Magnesium 400 MG TABS Take  daily.   olmesartan-hctz  40-25 MG tablet TAKE ONE TABLET  DAILY FOR BLOOD PRESSURE   ondansetron (ZOFRAN) 4 MG tablet Take 1 tablet  every 6  hours as needed for nausea or vomiting.   rosuvastatin (CRESTOR) 40 MG tablet TAKE ONE TABLET  DAILY FOR CHOLESTEROL     Allergies  Allergen Reactions   Other  Pt has cochlear implants - Cannot have MRI due to one of cochlear implants is metal.      PMHx:   Past Medical History:  Diagnosis Date   Aortic atherosclerosis (HCC)    Arthritis    CAD (coronary artery disease) 07/13/2021   Cor Ca++ Score 837.  Mixed mod plaque prox RCA(50 to 69%).  Mild Ca++ plaque mid RCA (25-49%).  Minimal Ca++ plaque prox LCx (0-24%).  Mid LCx CA++ plaque (50 to 69%), Prox OM1 CA++ plaque (50-69%).  Prox LAD mild to 0-24%. LM  mild 0-24% Ca++  plaque. => CT FFR + for distal LCx (small caliber)  Negative findings in the LAD, OM1 and  and RCA. =>  Would not recommend catheterization.   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 Hilarie Fredrickson:  Low-grade of narrowing Schatzki ring. Dilated to 18 mm with balloon. - 3 cm hiatal hernia. - Normal stomach. 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    Gallstone    Gastroparesis    GERD (gastroesophageal reflux disease)    Hiatal hernia    History of colon polyps    Hyperlipidemia    Hypertension    No sign of renal artery stenosis   IBS (irritable bowel syndrome)    Inguinal  hernia, bilateral    Pre-diabetes    RBBB    Schatzki's ring    Vertigo    Vitamin D deficiency      Immunization History  Administered Date(s) Administered   Influenza Split 08/28/2007   Pneumococcal - 13 04/16/2018   Pneumococcal  - 23 05/20/2019   Tdap 11/19/2010      Past Surgical History:  Procedure Laterality Date   ABDOMINAL SURGERY  1956   Related to bleeding episode.    BIOPSY  01/27/2020   Procedure: BIOPSY;  Surgeon: Rush Landmark Telford Nab., MD;  Location: [facility];  Service: Gastroenterology;;   BIOPSY  01/21/2021   Procedure: BIOPSY;  Surgeon: Irving Copas., MD;  Location: [facility];  Service: Gastroenterology;;   CARDIAC CATHETERIZATION  2005   no intervention per patient   CARPAL TUNNEL RELEASE Left 03/06/2020   Dr. Amedeo Plenty   COCHLEAR IMPLANT Bilateral    COLONOSCOPY     several - Last one 11/22/19   CYST REMOVAL HAND Left 1975   [place]   ENDOSCOPIC MUCOSAL RESECTION N/A 01/27/2020   Procedure: ENDOSCOPIC MUCOSAL RESECTION;  Surgeon: Irving Copas., MD;  Location: [facility];  Service: Gastroenterology;  Laterality: N/A;   ENTEROSCOPY N/A 01/27/2020   Procedure: ENTEROSCOPY;  Surgeon: Rush Landmark Telford Nab., MD;  Location: [facility];  Service: Gastroenterology;  Laterality: N/A;   ESOPHAGEAL MANOMETRY N/A 05/18/2022   Procedure: ESOPHAGEAL MANOMETRY (EM);  Surgeon: Jerene Bears, MD;  Location: [facility];  Service: Gastroenterology;  Laterality: N/A;   ESOPHAGOGASTRODUODENOSCOPY N/A 06/17/2022   Procedure: ESOPHAGOGASTRODUODENOSCOPY (EGD);  Surgeon: Lavena Bullion, DO;  Location: [facility];  Service: Gastroenterology;  Laterality: N/A;   ESOPHAGOGASTRODUODENOSCOPY (EGD) WITH PROPOFOL N/A 01/21/2021   Procedure: ESOPHAGOGASTRODUODENOSCOPY (EGD) WITH PROPOFOL;  Surgeon: Rush Landmark Telford Nab., MD;  Location: [facility];  Service: Gastroenterology;  Laterality: N/A;   HEMOSTASIS CLIP PLACEMENT  01/27/2020   Procedure:  HEMOSTASIS CLIP PLACEMENT;  Surgeon: Irving Copas., MD;  Location:  [facility];  Service: Gastroenterology;;   HIATAL HERNIA REPAIR N/A 06/17/2022   Procedure: LAPAROSCOPIC REPAIR OF HIATAL HERNIA WITH TIF;  Surgeon: Greer Pickerel, MD;  Location: [facility];  Service: General;  Laterality: N/A;   SKIN LESION EXCISION     STAPEDECTOMY Bilateral    x 2 - 10 yrs apart - No MRIs per patient.   SUBMUCOSAL LIFTING INJECTION  01/27/2020   Procedure: SUBMUCOSAL LIFTING INJECTION;  Surgeon: Rush Landmark Telford Nab., MD;  Location: [facility];  Service: Gastroenterology;;   SUBMUCOSAL TATTOO INJECTION  01/27/2020   Procedure: SUBMUCOSAL TATTOO INJECTION;  Surgeon: Irving Copas., MD;  Location: [facility];  Service: Gastroenterology;;   TRANSORAL INCISIONLESS FUNDOPLICATION N/A [id number]   Procedure: TRANSORAL INCISIONLESS FUNDOPLICATION;  Surgeon: Lavena Bullion, DO;  Location: [facility];  Service: Gastroenterology;  Laterality: N/A;   TRANSTHORACIC ECHOCARDIOGRAM  07/2017   Normal LV size and function.  EF 55-60%.  No RWMA.  Basal septal hypertrophy, but no sign of significant hypertensive heart disease.  Only GR 1 DD.   TRIGGER FINGER RELEASE Right 2014   Thumb   UPPER GI ENDOSCOPY     several - last one 11/22/2019   WISDOM TOOTH EXTRACTION       FHx:    Reviewed / unchanged   SHx:    Reviewed / unchanged    Systems Review:  Constitutional: Denies fever, chills, wt changes, headaches, insomnia, fatigue, night sweats, change in appetite. Eyes: Denies redness, blurred vision, diplopia, discharge, itchy, watery eyes.  ENT: Denies discharge, congestion, post nasal drip, epistaxis, sore throat, earache, hearing loss, dental pain, tinnitus, vertigo, sinus pain, snoring.  CV: Denies chest pain, palpitations, irregular heartbeat, syncope, dyspnea, diaphoresis, orthopnea, PND, claudication or edema. Respiratory: denies cough, dyspnea, DOE, pleurisy, hoarseness, laryngitis,  wheezing.  Gastrointestinal: Denies dysphagia, odynophagia, heartburn, reflux, water brash, abdominal pain or cramps, nausea, vomiting, bloating, diarrhea, constipation, hematemesis, melena, hematochezia  or hemorrhoids. Genitourinary: Denies dysuria, frequency, urgency, nocturia, hesitancy, discharge, hematuria or flank pain. Musculoskeletal: Denies arthralgias, myalgias, stiffness, jt. swelling, pain, limping or strain/sprain.  Skin: Denies pruritus, rash, hives, warts, acne, eczema or change in skin lesion(s). Neuro: No weakness, tremor, incoordination, spasms, paresthesia or pain. Psychiatric: Denies confusion, memory loss or sensory loss. Endo: Denies change in weight, skin or hair change.  Heme/Lymph: No excessive bleeding, bruising or enlarged lymph nodes.   Physical Exam  BP 110/70   Pulse 60   Temp 97.9 F (36.6 C)   Resp 17   Ht '6\' 1"'$  (1.854 m)   Wt 221 lb 12.8 oz (100.6 kg)   SpO2 97%   BMI 29.26 kg/m   Appears  well nourished, well groomed  and in no distress.  Eyes: PERRLA, EOMs, conjunctiva no swelling or erythema. Sinuses: No frontal/maxillary tenderness ENT/Mouth: EAC's clear, TM's nl w/o erythema, bulging. Nares clear w/o erythema, swelling, exudates. Oropharynx clear without erythema or exudates. Oral hygiene is good. Tongue normal, non obstructing. Hearing intact.  Neck: Supple. Thyroid not palpable. Car 2+/2+ without bruits, nodes or JVD. Chest: Respirations nl with BS clear & equal w/o rales, rhonchi, wheezing or stridor.  Cor: Heart sounds normal w/ regular rate and rhythm without sig. murmurs, gallops, clicks or rubs. Peripheral pulses normal and equal  without edema.  Abdomen: Soft & bowel sounds normal. Non-tender w/o guarding, rebound, hernias, masses or organomegaly.  Lymphatics: Unremarkable.  Musculoskeletal: Full ROM all peripheral extremities, joint stability, 5/5 strength and normal gait.  Skin: Warm, dry without exposed rashes, lesions or  ecchymosis  apparent.  Neuro: Cranial nerves intact, reflexes equal bilaterally. Sensory-motor testing grossly intact. Tendon reflexes grossly intact.  Pysch: Alert & oriented x 3.  Insight and judgement nl & appropriate. No ideations.   Assessment and Plan:  1. Essential hypertension  - Continue medication, monitor blood pressure at home.  - Continue DASH diet.  Reminder to go to the ER if any CP,  SOB, nausea, dizziness, severe HA, changes vision/speech.    - CBC with Differential/Platelet - COMPLETE METABOLIC PANEL WITH GFR - Magnesium - TSH  2. Hyperlipidemia associated with type 2 diabetes mellitus ([place])  - Continue diet/meds, exercise,& lifestyle modifications.  - Continue monitor periodic cholesterol/liver & renal functions     - Lipid panel - TSH  3. Type 2 diabetes mellitus with stage 2 chronic kidney  disease, without long-term current use of insulin (HCC)  - Continue diet, exercise  - Lifestyle modifications.  - Monitor appropriate lab    - Hemoglobin A1c - Insulin, random  4. Vitamin D deficiency  - Continue supplementation   - VITAMIN D 25 Hydroxy  5. Atherosclerosis of aorta ([place]) by CXR 2020  - Lipid panel  6. Medication management  - CBC with Differential/Platelet - COMPLETE METABOLIC PANEL WITH GFR - Magnesium - Lipid panel - TSH - Hemoglobin A1c - Insulin, random - VITAMIN D 25 Hydroxy           Discussed  regular exercise, BP monitoring, weight control to achieve/maintain BMI less than 25 and discussed med and SE's. Recommended labs to assess /monitor clinical status .  I discussed the assessment and treatment plan with the patient. The patient was provided an opportunity to ask questions and all were answered. The patient agreed with the plan and demonstrated an understanding of the instructions.  I provided over 30 minutes of exam, counseling, chart review and  complex critical decision making.        The patient was advised to call back or seek  an in-person evaluation if the symptoms worsen or if the condition fails to improve as anticipated.   Kirtland Bouchard, MD

## 2022-09-09 ENCOUNTER — Encounter: Payer: Self-pay | Admitting: Internal Medicine

## 2022-09-09 ENCOUNTER — Ambulatory Visit (INDEPENDENT_AMBULATORY_CARE_PROVIDER_SITE_OTHER): Payer: BC Managed Care – PPO | Admitting: Internal Medicine

## 2022-09-09 VITALS — BP 110/70 | HR 60 | Temp 97.9°F | Resp 17 | Ht 73.0 in | Wt 221.8 lb

## 2022-09-09 DIAGNOSIS — E1122 Type 2 diabetes mellitus with diabetic chronic kidney disease: Secondary | ICD-10-CM

## 2022-09-09 DIAGNOSIS — E559 Vitamin D deficiency, unspecified: Secondary | ICD-10-CM | POA: Diagnosis not present

## 2022-09-09 DIAGNOSIS — E1169 Type 2 diabetes mellitus with other specified complication: Secondary | ICD-10-CM

## 2022-09-09 DIAGNOSIS — E785 Hyperlipidemia, unspecified: Secondary | ICD-10-CM

## 2022-09-09 DIAGNOSIS — N182 Chronic kidney disease, stage 2 (mild): Secondary | ICD-10-CM

## 2022-09-09 DIAGNOSIS — Z79899 Other long term (current) drug therapy: Secondary | ICD-10-CM | POA: Diagnosis not present

## 2022-09-09 DIAGNOSIS — I700 Atherosclerosis of aorta: Secondary | ICD-10-CM | POA: Diagnosis not present

## 2022-09-09 DIAGNOSIS — I10 Essential (primary) hypertension: Secondary | ICD-10-CM

## 2022-09-09 NOTE — Patient Instructions (Signed)

## 2022-09-10 ENCOUNTER — Other Ambulatory Visit: Payer: Self-pay | Admitting: Internal Medicine

## 2022-09-10 DIAGNOSIS — E1122 Type 2 diabetes mellitus with diabetic chronic kidney disease: Secondary | ICD-10-CM

## 2022-09-10 MED ORDER — METFORMIN HCL ER 500 MG PO TB24: ORAL_TABLET | ORAL | 3 refills | Status: DC

## 2022-09-12 LAB — COMPLETE METABOLIC PANEL WITH GFR
AG Ratio: 1.7 (calc) (ref 1.0–2.5)
ALT: 22 U/L (ref 9–46)
AST: 21 U/L (ref 10–35)
Albumin: 4.6 g/dL (ref 3.6–5.1)
Alkaline phosphatase (APISO): 38 U/L (ref 35–144)
BUN: 19 mg/dL (ref 7–25)
CO2: 30 mmol/L (ref 20–32)
Calcium: 10.6 mg/dL — ABNORMAL HIGH (ref 8.6–10.3)
Chloride: 101 mmol/L (ref 98–110)
Creat: 1 mg/dL (ref 0.70–1.28)
Globulin: 2.7 g/dL (calc) (ref 1.9–3.7)
Glucose, Bld: 105 mg/dL — ABNORMAL HIGH (ref 65–99)
Potassium: 4.7 mmol/L (ref 3.5–5.3)
Sodium: 138 mmol/L (ref 135–146)
Total Bilirubin: 0.7 mg/dL (ref 0.2–1.2)
Total Protein: 7.3 g/dL (ref 6.1–8.1)
eGFR: 81 mL/min/{1.73_m2} (ref >=60)

## 2022-09-12 LAB — CBC WITH DIFFERENTIAL/PLATELET
Absolute Monocytes: 817 cells/uL (ref 200–950)
Basophils Absolute: 61 cells/uL (ref 0–200)
Basophils Relative: 1 %
Eosinophils Absolute: 390 cells/uL (ref 15–500)
Eosinophils Relative: 6.4 %
HCT: 43.7 % (ref 38.5–50.0)
Hemoglobin: 14.2 g/dL (ref 13.2–17.1)
Lymphs Abs: 1324 cells/uL (ref 850–3900)
MCH: 28.5 pg (ref 27.0–33.0)
MCHC: 32.5 g/dL (ref 32.0–36.0)
MCV: 87.8 fL (ref 80.0–100.0)
MPV: 9.5 fL (ref 7.5–12.5)
Monocytes Relative: 13.4 %
Neutro Abs: 3508 cells/uL (ref 1500–7800)
Neutrophils Relative %: 57.5 %
Platelets: 300 10*3/uL (ref 140–400)
RBC: 4.98 10*6/uL (ref 4.20–5.80)
RDW: 13.6 % (ref 11.0–15.0)
Total Lymphocyte: 21.7 %
WBC: 6.1 10*3/uL (ref 3.8–10.8)

## 2022-09-12 LAB — VITAMIN D 25 HYDROXY (VIT D DEFICIENCY, FRACTURES): Vit D, 25-Hydroxy: 32 ng/mL (ref 30–100)

## 2022-09-12 LAB — TSH: TSH: 3.19 mIU/L (ref 0.40–4.50)

## 2022-09-12 LAB — LIPID PANEL
Cholesterol: 123 mg/dL (ref <200)
HDL: 44 mg/dL (ref >=40)
LDL Cholesterol (Calc): 56 mg/dL (calc)
Non-HDL Cholesterol (Calc): 79 mg/dL (calc) (ref <130)
Total CHOL/HDL Ratio: 2.8 (calc) (ref <5.0)
Triglycerides: 145 mg/dL (ref <150)

## 2022-09-12 LAB — INSULIN, RANDOM: Insulin: 12.6 u[IU]/mL

## 2022-09-12 LAB — MAGNESIUM: Magnesium: 2.3 mg/dL (ref 1.5–2.5)

## 2022-09-12 LAB — HEMOGLOBIN A1C
Hgb A1c MFr Bld: 6.8 % of total Hgb — ABNORMAL HIGH (ref <5.7)
Mean Plasma Glucose: 148 mg/dL
eAG (mmol/L): 8.2 mmol/L

## 2022-09-15 ENCOUNTER — Encounter: Payer: Self-pay | Admitting: Internal Medicine

## 2022-09-26 ENCOUNTER — Other Ambulatory Visit: Payer: Self-pay

## 2022-09-26 DIAGNOSIS — I10 Essential (primary) hypertension: Secondary | ICD-10-CM

## 2022-09-26 MED ORDER — OLMESARTAN MEDOXOMIL-HCTZ 40-25 MG PO TABS: ORAL_TABLET | ORAL | 3 refills | Status: DC

## 2022-10-11 ENCOUNTER — Encounter: Payer: Self-pay | Admitting: Emergency Medicine

## 2022-10-11 ENCOUNTER — Ambulatory Visit
Admission: EM | Admit: 2022-10-11 | Discharge: 2022-10-11 | Disposition: A | Discharge status: Home / Self Care | Payer: Medicare Other | Attending: Family Medicine | Admitting: Family Medicine

## 2022-10-11 DIAGNOSIS — J209 Acute bronchitis, unspecified: Secondary | ICD-10-CM | POA: Diagnosis not present

## 2022-10-11 MED ORDER — PREDNISONE 20 MG PO TABS: 20.0000 mg | ORAL_TABLET | Freq: Two times a day (BID) | ORAL | 0 refills | Status: DC

## 2022-10-11 MED ORDER — AZITHROMYCIN 250 MG PO TABS: ORAL_TABLET | ORAL | 0 refills | Status: DC

## 2022-10-11 NOTE — ED Triage Notes (Signed)
Patient c/o chest congestion, some cough, nasal drainage has subsided x 3 days.  Patient taken Dayquil, Nyquil, Mucinex and antihistamines.  Denies any fever.

## 2022-10-11 NOTE — Discharge Instructions (Signed)
Take the azithromycin as directed.  2 pills today then 1 a day until gone Drink lots of fluids Take prednisone as directed 2 times a day for 5 days May use over the counter cough and cold medicines Call for problems

## 2022-10-11 NOTE — ED Provider Notes (Signed)
[facility]    CSN: [id number] Arrival date & time: 10/11/22  1515      History   Chief Complaint Chief Complaint  Patient presents with   Cough    HPI Coren Ryer is a 72 y.o. male.   HPI Patient is here with his wife.  They have just gotten back from [place].  He has been sick for 6 going on 7 days.  He has chest congestion cough chest tightness and coughing up sputum.  He states that started with upper respiratory and sinus symptoms but now has "settled in his chest".  He is scheduled to fly out in a couple of days and feels like he might need an antibiotic in order to get better.  Been using over-the-counter cough and cold medicines.  Non-smoker.  He does have a history of heart disease  Past Medical History:  Diagnosis Date   Aortic atherosclerosis    Arthritis    CAD (coronary artery disease) 07/13/2021   Cor Ca++ Score 837.  Mixed mod plaque prox RCA(50 to 69%).  Mild Ca++ plaque mid RCA (25-49%).  Minimal Ca++ plaque prox LCx (0-24%).  Mid LCx CA++ plaque (50 to 69%), Prox OM1 CA++ plaque (50-69%).  Prox LAD mild to 0-24%. LM  mild 0-24% Ca++  plaque. => CT FFR + for distal LCx (small caliber)  Negative findings in the LAD, OM1 and  and RCA. =>  Would not recommend catheterization.   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 Rhea Belton:  Low-grade of narrowing Schatzki ring. Dilated to 18 mm with balloon. - 3 cm hiatal hernia. - Normal stomach. 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    Gallstone    Gastroparesis    GERD (gastroesophageal reflux disease)    Hiatal hernia    History of colon polyps    Hyperlipidemia    Hypertension    No sign of renal artery stenosis   IBS (irritable bowel syndrome)    Inguinal hernia,  bilateral    Pre-diabetes    RBBB    Schatzki's ring    Vertigo    Vitamin D deficiency     Patient Active Problem List   Diagnosis Date Noted   History of repair of hiatal hernia 06/18/2022   History of fundoplication 06/18/2022   Peptic stricture of esophagus 06/17/2022   Hypercalcemia 06/17/2022   Esophageal dysphagia 05/20/2022   Metabolic syndrome 06/18/2021   Agatston coronary artery calcium score greater than 400 (1036 on 06/04/2021) 06/07/2021   S/P dilatation of esophageal stricture 11/23/2020   CKD stage 2 due to type 2 diabetes mellitus 02/13/2019   Atherosclerosis of aorta (HCC) by CXR 2020 07/26/2018   Overweight (BMI 25.0-29.9) 09/05/2017   Erectile dysfunction associated with type 2 diabetes mellitus 05/23/2017   Vitamin D deficiency 05/22/2017   Degenerative lumbar disc 04/13/2017   RBBB (right bundle branch block) 04/13/2017   Resistant hypertension 02/20/2013   Hyperlipidemia associated with type 2 diabetes mellitus 02/20/2013    Past Surgical History:  Procedure Laterality Date   ABDOMINAL SURGERY  1956   Related to bleeding episode.    BIOPSY  01/27/2020   Procedure: BIOPSY;  Surgeon: Meridee Score Netty Starring., MD;  Location: [facility] ENDOSCOPY;  Service: Gastroenterology;;  BIOPSY  01/21/2021   Procedure: BIOPSY;  Surgeon: Meridee Score Netty Starring., MD;  Location: [facility] ENDOSCOPY;  Service: Gastroenterology;;   CARDIAC CATHETERIZATION  2005   no intervention per patient   CARPAL TUNNEL RELEASE Left 03/06/2020   Dr. Amanda Pea   COCHLEAR IMPLANT Bilateral    COLONOSCOPY     several - Last one 11/22/19   CYST REMOVAL HAND Left 1975   [place]   ENDOSCOPIC MUCOSAL RESECTION N/A 01/27/2020   Procedure: ENDOSCOPIC MUCOSAL RESECTION;  Surgeon: Lemar Lofty., MD;  Location: [facility] ENDOSCOPY;  Service: Gastroenterology;  Laterality: N/A;   ENTEROSCOPY N/A 01/27/2020   Procedure: ENTEROSCOPY;  Surgeon: Meridee Score Netty Starring., MD;  Location: [facility] ENDOSCOPY;  Service:  Gastroenterology;  Laterality: N/A;   ESOPHAGEAL MANOMETRY N/A 05/18/2022   Procedure: ESOPHAGEAL MANOMETRY (EM);  Surgeon: Beverley Fiedler, MD;  Location: [facility] ENDOSCOPY;  Service: Gastroenterology;  Laterality: N/A;   ESOPHAGOGASTRODUODENOSCOPY N/A 06/17/2022   Procedure: ESOPHAGOGASTRODUODENOSCOPY (EGD);  Surgeon: Shellia Cleverly, DO;  Location: [facility] ORS;  Service: Gastroenterology;  Laterality: N/A;   ESOPHAGOGASTRODUODENOSCOPY (EGD) WITH PROPOFOL N/A 01/21/2021   Procedure: ESOPHAGOGASTRODUODENOSCOPY (EGD) WITH PROPOFOL;  Surgeon: Meridee Score Netty Starring., MD;  Location: [facility] ENDOSCOPY;  Service: Gastroenterology;  Laterality: N/A;   HEMOSTASIS CLIP PLACEMENT  01/27/2020   Procedure: HEMOSTASIS CLIP PLACEMENT;  Surgeon: Lemar Lofty., MD;  Location: [facility] ENDOSCOPY;  Service: Gastroenterology;;   HIATAL HERNIA REPAIR N/A 06/17/2022   Procedure: LAPAROSCOPIC REPAIR OF HIATAL HERNIA WITH TIF;  Surgeon: Gaynelle Adu, MD;  Location: [facility] ORS;  Service: General;  Laterality: N/A;   SKIN LESION EXCISION     STAPEDECTOMY Bilateral    x 2 - 10 yrs apart - No MRIs per patient.   SUBMUCOSAL LIFTING INJECTION  01/27/2020   Procedure: SUBMUCOSAL LIFTING INJECTION;  Surgeon: Meridee Score Netty Starring., MD;  Location: [facility] ENDOSCOPY;  Service: Gastroenterology;;   SUBMUCOSAL TATTOO INJECTION  01/27/2020   Procedure: SUBMUCOSAL TATTOO INJECTION;  Surgeon: Lemar Lofty., MD;  Location: [facility] ENDOSCOPY;  Service: Gastroenterology;;   TRANSORAL INCISIONLESS FUNDOPLICATION N/A 06/17/2022   Procedure: TRANSORAL INCISIONLESS FUNDOPLICATION;  Surgeon: Shellia Cleverly, DO;  Location: [facility] ORS;  Service: Gastroenterology;  Laterality: N/A;   TRANSTHORACIC ECHOCARDIOGRAM  07/2017   Normal LV size and function.  EF 55-60%.  No RWMA.  Basal septal hypertrophy, but no sign of significant hypertensive heart disease.  Only GR 1 DD.   TRIGGER FINGER RELEASE Right 2014   Thumb   UPPER GI ENDOSCOPY     several - last  one 11/22/2019   WISDOM TOOTH EXTRACTION         Home Medications    Prior to Admission medications   Medication Sig Start Date End Date Taking? Authorizing Provider  amLODipine (NORVASC) 10 MG tablet TAKE ONE TABLET BY MOUTH DAILY FOR BLOOD PRESSURE 09/17/21  Yes Judd Gaudier, NP  Ascorbic Acid (VITAMIN C) 1000 MG tablet Take 1,000 mg by mouth in the morning and at bedtime.   Yes [provider]  aspirin 81 MG tablet Take 1 tablet (81 mg total) by mouth daily. 06/20/22  Yes Mansouraty, Netty Starring., MD  azithromycin (ZITHROMAX Z-PAK) 250 MG tablet Take two pills today followed by one a day until gone 10/11/22  Yes Delton See Letta Pate, MD  blood glucose meter kit and supplies KIT Dispense based on patient and insurance preference. Use up to four times daily as directed. 06/23/22  Yes Raynelle Dick, NP  carvedilol (COREG) 12.5 MG tablet TAKE ONE TABLET  BY MOUTH TWICE A DAY 04/25/22  Yes Marykay Lex, MD  ezetimibe (ZETIA) 10 MG tablet Take 1 tablet (10 mg total) by mouth daily. 06/20/22  Yes Raynelle Dick, NP  gabapentin (NEURONTIN) 300 MG capsule TAKE ONE CAPSULE BY MOUTH THREE TIMES A DAY AS NEEDED 06/09/22  Yes Raynelle Dick, NP  Magnesium 400 MG TABS Take 400 mg by mouth daily.   Yes [provider]  metFORMIN (GLUCOPHAGE-XR) 500 MG 24 hr tablet Take 2 tablets 2 x /day with Meals for Diabetes. 09/10/22  Yes [name], MD  olmesartan-hydrochlorothiazide (BENICAR HCT) 40-25 MG tablet TAKE ONE TABLET BY MOUTH DAILY FOR BLOOD PRESSURE 09/26/22  Yes Raynelle Dick, NP  ondansetron (ZOFRAN) 4 MG tablet Take 1 tablet (4 mg total) by mouth every 6 (six) hours as needed for nausea or vomiting. 06/18/22  Yes Mansouraty, Netty Starring., MD  predniSONE (DELTASONE) 20 MG tablet Take 1 tablet (20 mg total) by mouth 2 (two) times daily with a meal. 10/11/22  Yes Eustace Moore, MD  rosuvastatin (CRESTOR) 40 MG tablet TAKE ONE TABLET BY MOUTH DAILY FOR CHOLESTEROL  06/07/22  Yes Raynelle Dick, NP    Family History Family History  Problem Relation Age of Onset   Heart disease Mother        Atrial flutter   COPD Mother    Heart failure Mother    Kidney disease Father    Arrhythmia Brother    Heart attack Brother    Hypertension Brother    Hyperlipidemia Brother    Hypertension Brother    Hyperlipidemia Brother    Esophageal cancer Neg Hx    Colon cancer Neg Hx     Social History Social History   Tobacco Use   Smoking status: Never   Smokeless tobacco: Never  Vaping Use   Vaping Use: Never used  Substance Use Topics   Alcohol use: Not Currently    Comment: occasional wine   Drug use: No    Comment: In 20s - cocaine, marijuana     Allergies   Other   Review of Systems Review of Systems See HPI  Physical Exam Triage Vital Signs ED Triage Vitals  Enc Vitals Group     BP 10/11/22 1543 (!) 150/86     Pulse Rate 10/11/22 1543 (!) 56     Resp 10/11/22 1543 18     Temp 10/11/22 1543 98.6 F (37 C)     Temp Source 10/11/22 1543 Oral     SpO2 10/11/22 1543 95 %     Weight 10/11/22 1544 215 lb (97.5 kg)     Height 10/11/22 1544 6\' 1"  (1.854 m)     Head Circumference --      Peak Flow --      Pain Score 10/11/22 1544 0     Pain Loc --      Pain Edu? --      Excl. in GC? --    No data found.  Updated Vital Signs BP (!) 150/86 (BP Location: Left Arm)   Pulse (!) 56   Temp 98.6 F (37 C) (Oral)   Resp 18   Ht 6\' 1"  (1.854 m)   Wt 97.5 kg   SpO2 95%   BMI 28.37 kg/m      Physical Exam Constitutional:      General: He is not in acute distress.    Appearance: He is well-developed and normal weight.  HENT:  Head: Normocephalic and atraumatic.  Eyes:     Conjunctiva/sclera: Conjunctivae normal.     Pupils: Pupils are equal, round, and reactive to light.  Cardiovascular:     Rate and Rhythm: Normal rate.     Heart sounds: Normal heart sounds.     Comments: Rare ectopy Pulmonary:     Effort: Pulmonary  effort is normal. No respiratory distress.     Breath sounds: Rhonchi present.  Abdominal:     General: There is no distension.     Palpations: Abdomen is soft.  Musculoskeletal:        General: Normal range of motion.     Cervical back: Normal range of motion.  Skin:    General: Skin is warm and dry.  Neurological:     Mental Status: He is alert.      UC Treatments / Results  Labs (all labs ordered are listed, but only abnormal results are displayed) Labs Reviewed - No data to display  EKG   Radiology No results found.  Procedures Procedures (including critical care time)  Medications Ordered in UC Medications - No data to display  Initial Impression / Assessment and Plan / UC Course  I have reviewed the triage vital signs and the nursing notes.  Pertinent labs & imaging results that were available during my care of the patient were reviewed by me and considered in my medical decision making (see chart for details).   Final Clinical Impressions(s) / UC Diagnoses   Final diagnoses:  Acute bronchitis, unspecified organism     Discharge Instructions      Take the azithromycin as directed.  2 pills today then 1 a day until gone Drink lots of fluids Take prednisone as directed 2 times a day for 5 days May use over the counter cough and cold medicines Call for problems   ED Prescriptions     Medication Sig Dispense Auth. Provider   predniSONE (DELTASONE) 20 MG tablet Take 1 tablet (20 mg total) by mouth 2 (two) times daily with a meal. 10 tablet Eustace MooreNelson, Etola Mull Sue, MD   azithromycin (ZITHROMAX Z-PAK) 250 MG tablet Take two pills today followed by one a day until gone 6 tablet Delton SeeNelson, Letta PateYvonne Sue, MD      PDMP not reviewed this encounter.   Eustace MooreNelson, Desirae Mancusi Sue, MD 10/11/22 1640

## 2022-10-25 ENCOUNTER — Encounter: Payer: Self-pay | Admitting: Gastroenterology

## 2022-10-28 NOTE — Telephone Encounter (Signed)
[facility] order and patient's insurance information have been faxed to [employer] at [phone].

## 2022-12-05 ENCOUNTER — Encounter: Payer: Self-pay | Admitting: Podiatry

## 2022-12-09 ENCOUNTER — Ambulatory Visit: Payer: Medicare Other | Admitting: Podiatry

## 2022-12-09 DIAGNOSIS — L600 Ingrowing nail: Secondary | ICD-10-CM

## 2022-12-09 NOTE — Progress Notes (Signed)
  Subjective:  Patient ID: Nathaniel Gill, male    DOB: Nov 30, 1950,   MRN: [id number]  Chief Complaint  Patient presents with   Nail Problem     Left great toe possible ingrown     72 y.o. male presents for concern of  left great toe pain and swelling. Last year he had his left laterall border treated. Relates the area has been tender and did swell up and been red and had him concerned. Relates it has improved since then. . Denies any other pedal complaints. Denies n/v/f/c.   Past Medical History:  Diagnosis Date   Aortic atherosclerosis (HCC)    Arthritis    CAD (coronary artery disease) 07/13/2021   Cor Ca++ Score 837.  Mixed mod plaque prox RCA(50 to 69%).  Mild Ca++ plaque mid RCA (25-49%).  Minimal Ca++ plaque prox LCx (0-24%).  Mid LCx CA++ plaque (50 to 69%), Prox OM1 CA++ plaque (50-69%).  Prox LAD mild to 0-24%. LM  mild 0-24% Ca++  plaque. => CT FFR + for distal LCx (small caliber)  Negative findings in the LAD, OM1 and  and RCA. =>  Would not recommend catheterization.   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 Rhea Belton:  Low-grade of narrowing Schatzki ring. Dilated to 18 mm with balloon. - 3 cm hiatal hernia. - Normal stomach. 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    Gallstone    Gastroparesis    GERD (gastroesophageal reflux disease)    Hiatal hernia    History of colon polyps    Hyperlipidemia    Hypertension    No sign of renal artery stenosis   IBS (irritable bowel syndrome)    Inguinal hernia, bilateral    Pre-diabetes    RBBB    Schatzki's ring    Vertigo    Vitamin D deficiency     Objective:  Physical Exam: Vascular: DP/PT pulses 2/4 bilateral. CFT <3 seconds. Normal hair growth on digits. No edema.  Skin. No  lacerations or abrasions bilateral feet. Left hallux lateral border mildly incurvated with some mild edema noted.  Musculoskeletal: MMT 5/5 bilateral lower extremities in DF, PF, Inversion and Eversion. Deceased ROM in DF of ankle joint.  Neurological: Sensation intact to light touch.   Assessment:  No diagnosis found.   Plan:  Patient was evaluated and treated and all questions answered. Discussed ingrown toenails etiology and treatment options including procedure for removal vs conservative care.  Will continue with conservative care for now and patient will return if worsens and decides he wants to repeat the procedure.    Louann Sjogren, DPM

## 2022-12-11 ENCOUNTER — Encounter: Payer: Self-pay | Admitting: Internal Medicine

## 2022-12-13 ENCOUNTER — Encounter: Payer: Self-pay | Admitting: Internal Medicine

## 2022-12-14 ENCOUNTER — Other Ambulatory Visit: Payer: Self-pay | Admitting: Nurse Practitioner

## 2022-12-28 DIAGNOSIS — I10 Essential (primary) hypertension: Secondary | ICD-10-CM | POA: Diagnosis not present

## 2023-01-02 ENCOUNTER — Encounter: Payer: Self-pay | Admitting: Internal Medicine

## 2023-01-03 ENCOUNTER — Ambulatory Visit (INDEPENDENT_AMBULATORY_CARE_PROVIDER_SITE_OTHER): Payer: Medicare Other | Admitting: Nurse Practitioner

## 2023-01-03 ENCOUNTER — Encounter: Payer: Self-pay | Admitting: Nurse Practitioner

## 2023-01-03 VITALS — BP 134/72 | HR 64 | Temp 97.8°F | Ht 73.0 in | Wt 219.0 lb

## 2023-01-03 DIAGNOSIS — M1990 Unspecified osteoarthritis, unspecified site: Secondary | ICD-10-CM

## 2023-01-03 DIAGNOSIS — E1122 Type 2 diabetes mellitus with diabetic chronic kidney disease: Secondary | ICD-10-CM

## 2023-01-03 DIAGNOSIS — Z0001 Encounter for general adult medical examination with abnormal findings: Secondary | ICD-10-CM

## 2023-01-03 DIAGNOSIS — E559 Vitamin D deficiency, unspecified: Secondary | ICD-10-CM

## 2023-01-03 DIAGNOSIS — I4510 Unspecified right bundle-branch block: Secondary | ICD-10-CM

## 2023-01-03 DIAGNOSIS — I1A0 Resistant hypertension: Secondary | ICD-10-CM | POA: Diagnosis not present

## 2023-01-03 DIAGNOSIS — M5136 Other intervertebral disc degeneration, lumbar region: Secondary | ICD-10-CM

## 2023-01-03 DIAGNOSIS — Z9889 Other specified postprocedural states: Secondary | ICD-10-CM

## 2023-01-03 DIAGNOSIS — M51369 Other intervertebral disc degeneration, lumbar region without mention of lumbar back pain or lower extremity pain: Secondary | ICD-10-CM

## 2023-01-03 DIAGNOSIS — R011 Cardiac murmur, unspecified: Secondary | ICD-10-CM | POA: Diagnosis not present

## 2023-01-03 DIAGNOSIS — E1169 Type 2 diabetes mellitus with other specified complication: Secondary | ICD-10-CM | POA: Diagnosis not present

## 2023-01-03 DIAGNOSIS — E785 Hyperlipidemia, unspecified: Secondary | ICD-10-CM

## 2023-01-03 DIAGNOSIS — K449 Diaphragmatic hernia without obstruction or gangrene: Secondary | ICD-10-CM

## 2023-01-03 DIAGNOSIS — R6889 Other general symptoms and signs: Secondary | ICD-10-CM | POA: Diagnosis not present

## 2023-01-03 DIAGNOSIS — Z0000 Encounter for general adult medical examination without abnormal findings: Secondary | ICD-10-CM

## 2023-01-03 DIAGNOSIS — K317 Polyp of stomach and duodenum: Secondary | ICD-10-CM

## 2023-01-03 DIAGNOSIS — E663 Overweight: Secondary | ICD-10-CM

## 2023-01-03 DIAGNOSIS — N521 Erectile dysfunction due to diseases classified elsewhere: Secondary | ICD-10-CM

## 2023-01-03 DIAGNOSIS — Z79899 Other long term (current) drug therapy: Secondary | ICD-10-CM

## 2023-01-03 DIAGNOSIS — N182 Chronic kidney disease, stage 2 (mild): Secondary | ICD-10-CM

## 2023-01-03 DIAGNOSIS — R931 Abnormal findings on diagnostic imaging of heart and coronary circulation: Secondary | ICD-10-CM

## 2023-01-03 DIAGNOSIS — Z8719 Personal history of other diseases of the digestive system: Secondary | ICD-10-CM

## 2023-01-03 DIAGNOSIS — I700 Atherosclerosis of aorta: Secondary | ICD-10-CM

## 2023-01-03 DIAGNOSIS — K2100 Gastro-esophageal reflux disease with esophagitis, without bleeding: Secondary | ICD-10-CM

## 2023-01-03 NOTE — Patient Instructions (Signed)
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
## 2023-01-04 LAB — COMPLETE METABOLIC PANEL WITH GFR
AG Ratio: 2.2 (calc) (ref 1.0–2.5)
ALT: 31 U/L (ref 9–46)
AST: 30 U/L (ref 10–35)
Albumin: 4.6 g/dL (ref 3.6–5.1)
Alkaline phosphatase (APISO): 34 U/L — ABNORMAL LOW (ref 35–144)
BUN: 16 mg/dL (ref 7–25)
CO2: 31 mmol/L (ref 20–32)
Calcium: 10.2 mg/dL (ref 8.6–10.3)
Chloride: 100 mmol/L (ref 98–110)
Creat: 0.92 mg/dL (ref 0.70–1.28)
Globulin: 2.1 g/dL (calc) (ref 1.9–3.7)
Glucose, Bld: 100 mg/dL — ABNORMAL HIGH (ref 65–99)
Potassium: 4.4 mmol/L (ref 3.5–5.3)
Sodium: 139 mmol/L (ref 135–146)
Total Bilirubin: 0.8 mg/dL (ref 0.2–1.2)
Total Protein: 6.7 g/dL (ref 6.1–8.1)
eGFR: 89 mL/min/{1.73_m2} (ref >=60)

## 2023-01-04 LAB — CBC WITH DIFFERENTIAL/PLATELET
Absolute Monocytes: 747 cells/uL (ref 200–950)
Basophils Absolute: 103 cells/uL (ref 0–200)
Basophils Relative: 1.8 %
Eosinophils Absolute: 302 cells/uL (ref 15–500)
Eosinophils Relative: 5.3 %
HCT: 43.9 % (ref 38.5–50.0)
Hemoglobin: 14.7 g/dL (ref 13.2–17.1)
Lymphs Abs: 1431 cells/uL (ref 850–3900)
MCH: 30.2 pg (ref 27.0–33.0)
MCHC: 33.5 g/dL (ref 32.0–36.0)
MCV: 90.1 fL (ref 80.0–100.0)
MPV: 9.8 fL (ref 7.5–12.5)
Monocytes Relative: 13.1 %
Neutro Abs: 3118 cells/uL (ref 1500–7800)
Neutrophils Relative %: 54.7 %
Platelets: 256 10*3/uL (ref 140–400)
RBC: 4.87 10*6/uL (ref 4.20–5.80)
RDW: 12.9 % (ref 11.0–15.0)
Total Lymphocyte: 25.1 %
WBC: 5.7 10*3/uL (ref 3.8–10.8)

## 2023-01-04 LAB — HEMOGLOBIN A1C
Hgb A1c MFr Bld: 6.2 % of total Hgb — ABNORMAL HIGH (ref <5.7)
Mean Plasma Glucose: 131 mg/dL
eAG (mmol/L): 7.3 mmol/L

## 2023-01-04 LAB — LIPID PANEL
Cholesterol: 104 mg/dL (ref <200)
HDL: 42 mg/dL (ref >=40)
LDL Cholesterol (Calc): 41 mg/dL (calc)
Non-HDL Cholesterol (Calc): 62 mg/dL (calc) (ref <130)
Total CHOL/HDL Ratio: 2.5 (calc) (ref <5.0)
Triglycerides: 120 mg/dL (ref <150)

## 2023-01-23 DIAGNOSIS — M25562 Pain in left knee: Secondary | ICD-10-CM | POA: Diagnosis not present

## 2023-01-24 ENCOUNTER — Ambulatory Visit: Payer: BC Managed Care – PPO | Admitting: Gastroenterology

## 2023-02-14 ENCOUNTER — Ambulatory Visit (AMBULATORY_SURGERY_CENTER): Payer: Medicare Other | Admitting: *Deleted

## 2023-02-14 ENCOUNTER — Other Ambulatory Visit: Payer: Self-pay | Admitting: Cardiology

## 2023-02-14 VITALS — Ht 73.0 in | Wt 220.0 lb

## 2023-02-14 DIAGNOSIS — Z86010 Personal history of colonic polyps: Secondary | ICD-10-CM

## 2023-02-14 MED ORDER — NA SULFATE-K SULFATE-MG SULF 17.5-3.13-1.6 GM/177ML PO SOLN
1.0000 | Freq: Once | ORAL | 0 refills | Status: AC
Start: 2023-02-14 — End: 2023-02-14

## 2023-02-14 NOTE — Progress Notes (Signed)
Pt's name and DOB verified at the beginning of the pre-visit.  Pt denies any difficulty with ambulating,sitting, laying down or rolling side to side Gave both [facility] main # and MD on call # prior to instructions.  No egg or soy allergy known to patient  No issues known to pt with past sedation with any surgeries or procedures Pt denies having issues being intubated Pt has no issues moving head neck or swallowing No FH of Malignant Hyperthermia Pt is not on diet pills Pt is not on home 02  Pt is not on blood thinners  Pt denies issues with constipation  Pt is not on dialysis Pt denise any abnormal heart rhythms  Pt denies any upcoming cardiac testing Pt encouraged to use to use Singlecare or Goodrx to reduce cost  Patient's chart reviewed by Nathaniel Gill CNRA prior to pre-visit and patient appropriate for the [facility].  Pre-visit completed and red dot placed by patient's name on their procedure day (on provider's schedule).  . Visit by phone Pt states weight is 220 lb Instructed pt why it is important to and  to call if they have any changes in health or new medications. Directed them to the # given and on instructions.   Pt states they will.  Instructions reviewed with pt and pt states understanding. Instructed to review again prior to procedure. Pt states they will.  Instructions sent by mail with coupon and by my chart

## 2023-02-24 ENCOUNTER — Encounter: Payer: Self-pay | Admitting: Internal Medicine

## 2023-03-10 ENCOUNTER — Ambulatory Visit (AMBULATORY_SURGERY_CENTER): Payer: Medicare Other | Admitting: Internal Medicine

## 2023-03-10 ENCOUNTER — Encounter: Payer: Self-pay | Admitting: Internal Medicine

## 2023-03-10 VITALS — BP 137/77 | HR 58 | Temp 97.3°F | Resp 14 | Ht 73.0 in | Wt 220.0 lb

## 2023-03-10 DIAGNOSIS — D123 Benign neoplasm of transverse colon: Secondary | ICD-10-CM

## 2023-03-10 DIAGNOSIS — Z1211 Encounter for screening for malignant neoplasm of colon: Secondary | ICD-10-CM | POA: Diagnosis not present

## 2023-03-10 DIAGNOSIS — Z86010 Personal history of colonic polyps: Secondary | ICD-10-CM

## 2023-03-10 DIAGNOSIS — K635 Polyp of colon: Secondary | ICD-10-CM | POA: Diagnosis not present

## 2023-03-10 DIAGNOSIS — Z09 Encounter for follow-up examination after completed treatment for conditions other than malignant neoplasm: Secondary | ICD-10-CM | POA: Diagnosis not present

## 2023-03-10 MED ORDER — SODIUM CHLORIDE 0.9 % IV SOLN
500.0000 mL | INTRAVENOUS | Status: DC
Start: 2023-03-10 — End: 2023-03-10

## 2023-03-10 NOTE — Progress Notes (Signed)
Sedate, gd SR, tolerated procedure well, VSS, report to RN 

## 2023-03-10 NOTE — Patient Instructions (Signed)
Thank you for letting us take care of your healthcare needs today. Please see handouts given to you on Polyps, Diverticulosis and hemorrhoids.    YOU HAD AN ENDOSCOPIC PROCEDURE TODAY AT THE [facility]:   Refer to the procedure report that was given to you for any specific questions about what was found during the examination.  If the procedure report does not answer your questions, please call your gastroenterologist to clarify.  If you requested that your care partner not be given the details of your procedure findings, then the procedure report has been included in a sealed envelope for you to review at your convenience later.  YOU SHOULD EXPECT: Some feelings of bloating in the abdomen. Passage of more gas than usual.  Walking can help get rid of the air that was put into your GI tract during the procedure and reduce the bloating. If you had a lower endoscopy (such as a colonoscopy or flexible sigmoidoscopy) you may notice spotting of blood in your stool or on the toilet paper. If you underwent a bowel prep for your procedure, you may not have a normal bowel movement for a few days.  Please Note:  You might notice some irritation and congestion in your nose or some drainage.  This is from the oxygen used during your procedure.  There is no need for concern and it should clear up in a day or so.  SYMPTOMS TO REPORT IMMEDIATELY:  Following lower endoscopy (colonoscopy or flexible sigmoidoscopy):  Excessive amounts of blood in the stool  Significant tenderness or worsening of abdominal pains  Swelling of the abdomen that is new, acute  Fever of 100F or higher   For urgent or emergent issues, a gastroenterologist can be reached at any hour by calling [phone]. Do not use MyChart messaging for urgent concerns.    DIET:  We do recommend a small meal at first, but then you may proceed to your regular diet.  Drink plenty of fluids but you should avoid alcoholic beverages for  24 hours.  ACTIVITY:  You should plan to take it easy for the rest of today and you should NOT DRIVE or use heavy machinery until tomorrow (because of the sedation medicines used during the test).    FOLLOW UP: Our staff will call the number listed on your records the next business day following your procedure.  We will call around 7:15- 8:00 am to check on you and address any questions or concerns that you may have regarding the information given to you following your procedure. If we do not reach you, we will leave a message.     If any biopsies were taken you will be contacted by phone or by letter within the next 1-3 weeks.  Please call us at [phone] if you have not heard about the biopsies in 3 weeks.    SIGNATURES/CONFIDENTIALITY: You and/or your care partner have signed paperwork which will be entered into your electronic medical record.  These signatures attest to the fact that that the information above on your After Visit Summary has been reviewed and is understood.  Full responsibility of the confidentiality of this discharge information lies with you and/or your care-partner.

## 2023-03-10 NOTE — Progress Notes (Signed)
Called to room to assist during endoscopic procedure.  Patient ID and intended procedure confirmed with present staff. Received instructions for my participation in the procedure from the performing physician.  

## 2023-03-10 NOTE — Progress Notes (Signed)
GASTROENTEROLOGY PROCEDURE H&P NOTE   Primary Care Physician: [name], MD    Reason for Procedure:  History of adenomatous colon polyps  Plan:    Colonoscopy  Patient is appropriate for endoscopic procedure(s) in the ambulatory ([facility]) setting.  The nature of the procedure, as well as the risks, benefits, and alternatives were carefully and thoroughly reviewed with the patient. Ample time for discussion and questions allowed. The patient understood, was satisfied, and agreed to proceed.     HPI: Nathaniel Gill is a 72 y.o. male who presents for surveillance colonoscopy.  Medical history as below.  Tolerated the prep.  No recent chest pain or shortness of breath.  No abdominal pain today.  Past Medical History:  Diagnosis Date   Aortic atherosclerosis (HCC)    Arthritis    CAD (coronary artery disease) 07/13/2021   Cor Ca++ Score 837.  Mixed mod plaque prox RCA(50 to 69%).  Mild Ca++ plaque mid RCA (25-49%).  Minimal Ca++ plaque prox LCx (0-24%).  Mid LCx CA++ plaque (50 to 69%), Prox OM1 CA++ plaque (50-69%).  Prox LAD mild to 0-24%. LM  mild 0-24% Ca++  plaque. => CT FFR + for distal LCx (small caliber)  Negative findings in the LAD, OM1 and  and RCA. =>  Would not recommend catheterization.   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 Rhea Belton:  Low-grade of narrowing Schatzki ring. Dilated to 18 mm with balloon. - 3 cm hiatal hernia. - Normal stomach. 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    Gallstone    Gastroparesis    GERD (gastroesophageal reflux disease)    Hiatal hernia    History of colon polyps    Hyperlipidemia    Hypertension    No sign of renal artery stenosis   IBS (irritable bowel syndrome)    Inguinal hernia,  bilateral    Pre-diabetes    RBBB    Schatzki's ring    Vertigo    Vitamin D deficiency     Past Surgical History:  Procedure Laterality Date   ABDOMINAL SURGERY  1956   Related to bleeding episode.    BIOPSY  01/27/2020   Procedure: BIOPSY;  Surgeon: Meridee Score Netty Starring., MD;  Location: [facility] ENDOSCOPY;  Service: Gastroenterology;;   BIOPSY  01/21/2021   Procedure: BIOPSY;  Surgeon: Lemar Lofty., MD;  Location: [facility] ENDOSCOPY;  Service: Gastroenterology;;   CARDIAC CATHETERIZATION  2005   no intervention per patient   CARPAL TUNNEL RELEASE Left 03/06/2020   Dr. Amanda Pea   COCHLEAR IMPLANT Bilateral    COLONOSCOPY     several - Last one 11/22/19   CYST REMOVAL HAND Left 1975   [place]   ENDOSCOPIC MUCOSAL RESECTION N/A 01/27/2020   Procedure: ENDOSCOPIC MUCOSAL RESECTION;  Surgeon: Lemar Lofty., MD;  Location: [facility] ENDOSCOPY;  Service: Gastroenterology;  Laterality: N/A;   ENTEROSCOPY N/A 01/27/2020   Procedure: ENTEROSCOPY;  Surgeon: Meridee Score Netty Starring., MD;  Location: [facility] ENDOSCOPY;  Service: Gastroenterology;  Laterality: N/A;   ESOPHAGEAL MANOMETRY N/A 05/18/2022   Procedure: ESOPHAGEAL MANOMETRY (EM);  Surgeon: Beverley Fiedler, MD;  Location: [facility] ENDOSCOPY;  Service: Gastroenterology;  Laterality: N/A;   ESOPHAGOGASTRODUODENOSCOPY N/A 06/17/2022   Procedure: ESOPHAGOGASTRODUODENOSCOPY (EGD);  Surgeon: Doristine Locks  V, DO;  Location: [facility] ORS;  Service: Gastroenterology;  Laterality: N/A;   ESOPHAGOGASTRODUODENOSCOPY (EGD) WITH PROPOFOL N/A 01/21/2021   Procedure: ESOPHAGOGASTRODUODENOSCOPY (EGD) WITH PROPOFOL;  Surgeon: Meridee Score Netty Starring., MD;  Location: [facility] ENDOSCOPY;  Service: Gastroenterology;  Laterality: N/A;   HEMOSTASIS CLIP PLACEMENT  01/27/2020   Procedure: HEMOSTASIS CLIP PLACEMENT;  Surgeon: Lemar Lofty., MD;  Location: [facility] ENDOSCOPY;  Service: Gastroenterology;;   HIATAL HERNIA REPAIR N/A 06/17/2022   Procedure: LAPAROSCOPIC REPAIR OF  HIATAL HERNIA WITH TIF;  Surgeon: Gaynelle Adu, MD;  Location: [facility] ORS;  Service: General;  Laterality: N/A;   SKIN LESION EXCISION     STAPEDECTOMY Bilateral    x 2 - 10 yrs apart - No MRIs per patient.   SUBMUCOSAL LIFTING INJECTION  01/27/2020   Procedure: SUBMUCOSAL LIFTING INJECTION;  Surgeon: Meridee Score Netty Starring., MD;  Location: [facility] ENDOSCOPY;  Service: Gastroenterology;;   SUBMUCOSAL TATTOO INJECTION  01/27/2020   Procedure: SUBMUCOSAL TATTOO INJECTION;  Surgeon: Lemar Lofty., MD;  Location: [facility] ENDOSCOPY;  Service: Gastroenterology;;   TRANSORAL INCISIONLESS FUNDOPLICATION N/A 06/17/2022   Procedure: TRANSORAL INCISIONLESS FUNDOPLICATION;  Surgeon: Shellia Cleverly, DO;  Location: [facility] ORS;  Service: Gastroenterology;  Laterality: N/A;   TRANSTHORACIC ECHOCARDIOGRAM  07/2017   Normal LV size and function.  EF 55-60%.  No RWMA.  Basal septal hypertrophy, but no sign of significant hypertensive heart disease.  Only GR 1 DD.   TRIGGER FINGER RELEASE Right 2014   Thumb   UPPER GI ENDOSCOPY     several - last one 11/22/2019   WISDOM TOOTH EXTRACTION      Prior to Admission medications   Medication Sig Start Date End Date Taking? Authorizing Provider  amLODipine (NORVASC) 10 MG tablet TAKE ONE TABLET BY MOUTH DAILY FOR BLOOD PRESSURE 09/17/21  Yes Judd Gaudier, NP  Ascorbic Acid (VITAMIN C) 1000 MG tablet Take 1,000 mg by mouth in the morning and at bedtime.   Yes [provider]  aspirin 81 MG tablet Take 1 tablet (81 mg total) by mouth daily. 06/20/22  Yes Mansouraty, Netty Starring., MD  carvedilol (COREG) 12.5 MG tablet TAKE 1 TABLET BY MOUTH TWICE A DAY 02/15/23  Yes Marykay Lex, MD  ezetimibe (ZETIA) 10 MG tablet TAKE 1 TABLET BY MOUTH DAILY 12/14/22  Yes Raynelle Dick, NP  Magnesium 400 MG TABS Take 400 mg by mouth daily.   Yes [provider]  metFORMIN (GLUCOPHAGE-XR) 500 MG 24 hr tablet Take 2 tablets 2 x /day with Meals for Diabetes. 09/10/22   Yes [name], MD  olmesartan-hydrochlorothiazide [facility] HCT) 40-25 MG tablet TAKE ONE TABLET BY MOUTH DAILY FOR BLOOD PRESSURE 09/26/22  Yes Raynelle Dick, NP  rosuvastatin (CRESTOR) 40 MG tablet TAKE ONE TABLET BY MOUTH DAILY FOR CHOLESTEROL 06/07/22  Yes Raynelle Dick, NP  blood glucose meter kit and supplies KIT Dispense based on patient and insurance preference. Use up to four times daily as directed. 06/23/22   Raynelle Dick, NP  gabapentin (NEURONTIN) 300 MG capsule TAKE ONE CAPSULE BY MOUTH THREE TIMES A DAY AS NEEDED 06/09/22   Raynelle Dick, NP  ondansetron (ZOFRAN) 4 MG tablet Take 1 tablet (4 mg total) by mouth every 6 (six) hours as needed for nausea or vomiting. Patient not taking: Reported on 01/03/2023 06/18/22   Mansouraty, Netty Starring., MD    Current Outpatient Medications  Medication Sig Dispense Refill   amLODipine (NORVASC) 10 MG tablet TAKE ONE TABLET BY MOUTH  DAILY FOR BLOOD PRESSURE 90 tablet 3   Ascorbic Acid (VITAMIN C) 1000 MG tablet Take 1,000 mg by mouth in the morning and at bedtime.     aspirin 81 MG tablet Take 1 tablet (81 mg total) by mouth daily. 30 tablet    carvedilol (COREG) 12.5 MG tablet TAKE 1 TABLET BY MOUTH TWICE A DAY 180 tablet 1   ezetimibe (ZETIA) 10 MG tablet TAKE 1 TABLET BY MOUTH DAILY 90 tablet 1   Magnesium 400 MG TABS Take 400 mg by mouth daily.     metFORMIN (GLUCOPHAGE-XR) 500 MG 24 hr tablet Take 2 tablets 2 x /day with Meals for Diabetes. 360 tablet 3   olmesartan-hydrochlorothiazide (BENICAR HCT) 40-25 MG tablet TAKE ONE TABLET BY MOUTH DAILY FOR BLOOD PRESSURE 90 tablet 3   rosuvastatin (CRESTOR) 40 MG tablet TAKE ONE TABLET BY MOUTH DAILY FOR CHOLESTEROL 90 tablet 3   blood glucose meter kit and supplies KIT Dispense based on patient and insurance preference. Use up to four times daily as directed. 1 each 0   gabapentin (NEURONTIN) 300 MG capsule TAKE ONE CAPSULE BY MOUTH THREE TIMES A DAY AS NEEDED 90 capsule 1    ondansetron (ZOFRAN) 4 MG tablet Take 1 tablet (4 mg total) by mouth every 6 (six) hours as needed for nausea or vomiting. (Patient not taking: Reported on 01/03/2023) 30 tablet 1   Current Facility-Administered Medications  Medication Dose Route Frequency Provider Last Rate Last Admin   0.9 %  sodium chloride infusion  500 mL Intravenous Continuous Patina Spanier, Carie Caddy, MD        Allergies as of 03/10/2023 - Review Complete 03/10/2023  Allergen Reaction Noted   Other  06/14/2022    Family History  Problem Relation Age of Onset   Heart disease Mother        Atrial flutter   COPD Mother    Heart failure Mother    Kidney disease Father    Arrhythmia Brother    Heart attack Brother    Hypertension Brother    Hyperlipidemia Brother    Hypertension Brother    Hyperlipidemia Brother    Esophageal cancer Neg Hx    Colon cancer Neg Hx    Colon polyps Neg Hx    Stomach cancer Neg Hx    Ulcerative colitis Neg Hx     Social History   Socioeconomic History   Marital status: Married    Spouse name: Not on file   Number of children: Not on file   Years of education: Not on file   Highest education level: Not on file  Occupational History   Not on file  Tobacco Use   Smoking status: Never   Smokeless tobacco: Never  Vaping Use   Vaping status: Never Used  Substance and Sexual Activity   Alcohol use: Not Currently    Comment: occasional wine   Drug use: No    Comment: In 64s - cocaine, marijuana   Sexual activity: Yes    Partners: Female    Birth control/protection: None  Other Topics Concern   Not on file  Social History Narrative   Not on file   Social Determinants of Health   Financial Resource Strain: Not on file  Food Insecurity: Not on file  Transportation Needs: Not on file  Physical Activity: Insufficiently Active (05/20/2019)   Exercise Vital Sign    Days of Exercise per Week: 7 days    Minutes of Exercise per Session: 20 min  Stress: No Stress Concern Present  (05/20/2019)   [employer] of Occupational Health - Occupational Stress Questionnaire    Feeling of Stress : Only a little  Social Connections: Not on file  Intimate Partner Violence: Not on file    Physical Exam: Vital signs in last 24 hours: @BP  (!) 155/94   Pulse (!) 52   Temp (!) 97.3 F (36.3 C)   Ht 6\' 1"  (1.854 m)   Wt 220 lb (99.8 kg)   SpO2 98%   BMI 29.03 kg/m  GEN: NAD EYE: Sclerae anicteric ENT: MMM CV: Non-tachycardic Pulm: CTA b/l GI: Soft, NT/ND NEURO:  Alert & Oriented x 3   Erick Blinks, MD [facility]  03/10/2023 10:53 AM

## 2023-03-10 NOTE — Progress Notes (Signed)
Pt's states no medical or surgical changes since previsit or office visit. 

## 2023-03-10 NOTE — Op Note (Signed)
[facility] Patient Name: Nathaniel Gill Procedure Date: 03/10/2023 10:55 AM MRN: [id number] Endoscopist: Beverley Fiedler , MD, [id number] Age: 72 Referring MD:  Date of Birth: 1950/09/01 Gender: Male Account #: [id number] Procedure:                Colonoscopy Indications:              High risk colon cancer surveillance: Personal                            history of multiple (3 or more) adenomas, Last                            colonoscopy: May 2021 Medicines:                Monitored Anesthesia Care Procedure:                Pre-Anesthesia Assessment:                           - Prior to the procedure, a History and Physical                            was performed, and patient medications and                            allergies were reviewed. The patient's tolerance of                            previous anesthesia was also reviewed. The risks                            and benefits of the procedure and the sedation                            options and risks were discussed with the patient.                            All questions were answered, and informed consent                            was obtained. Prior Anticoagulants: The patient has                            taken no anticoagulant or antiplatelet agents. ASA                            Grade Assessment: II - A patient with mild systemic                            disease. After reviewing the risks and benefits,                            the patient was deemed in satisfactory condition to  undergo the procedure.                           After obtaining informed consent, the colonoscope                            was passed under direct vision. Throughout the                            procedure, the patient's blood pressure, pulse, and                            oxygen saturations were monitored continuously. The                            Olympus CF-HQ190L [id number]) Colonoscope was                             introduced through the anus and advanced to the                            cecum, identified by appendiceal orifice and                            ileocecal valve. The colonoscopy was performed                            without difficulty. The patient tolerated the                            procedure well. The quality of the bowel                            preparation was good. The ileocecal valve,                            appendiceal orifice, and rectum were photographed. Scope In: 11:01:57 AM Scope Out: 11:16:23 AM Scope Withdrawal Time: 0 hours 12 minutes 12 seconds  Total Procedure Duration: 0 hours 14 minutes 26 seconds  Findings:                 The digital rectal exam was normal.                           A 5 mm polyp was found in the transverse colon. The                            polyp was sessile. The polyp was removed with a                            cold snare. Resection and retrieval were complete.                           Multiple medium-mouthed and small-mouthed  diverticula were found in the sigmoid colon and                            descending colon.                           Internal hemorrhoids were found during                            retroflexion. The hemorrhoids were small. Complications:            No immediate complications. Estimated Blood Loss:     Estimated blood loss was minimal. Impression:               - One 5 mm polyp in the transverse colon, removed                            with a cold snare. Resected and retrieved.                           - Moderate diverticulosis in the sigmoid colon and                            in the descending colon.                           - Small internal hemorrhoids. Recommendation:           - Patient has a contact number available for                            emergencies. The signs and symptoms of potential                            delayed complications  were discussed with the                            patient. Return to normal activities tomorrow.                            Written discharge instructions were provided to the                            patient.                           - Resume previous diet.                           - Continue present medications.                           - Await pathology results.                           - Repeat colonoscopy is recommended for  surveillance. The colonoscopy date will be                            determined after pathology results from today's                            exam become available for review. Beverley Fiedler, MD 03/10/2023 11:21:09 AM This report has been signed electronically.

## 2023-03-13 ENCOUNTER — Telehealth: Payer: Self-pay | Admitting: *Deleted

## 2023-03-13 NOTE — Telephone Encounter (Signed)
  Follow up Call-     03/10/2023   10:29 AM 05/13/2022    2:16 PM 05/13/2022    2:12 PM  Call back number  Post procedure Call Back phone  # [phone]   Permission to leave phone message Yes  Yes     Patient questions:  Do you have a fever, pain , or abdominal swelling? No. Pain Score  0 *  Have you tolerated food without any problems? Yes.    Have you been able to return to your normal activities? Yes.    Do you have any questions about your discharge instructions: Diet   No. Medications  No. Follow up visit  No.  Do you have questions or concerns about your Care? No.  Actions: * If pain score is 4 or above: No action needed, pain <4.

## 2023-03-14 ENCOUNTER — Encounter: Payer: Self-pay | Admitting: Internal Medicine

## 2023-04-09 ENCOUNTER — Encounter: Payer: Self-pay | Admitting: Internal Medicine

## 2023-04-09 NOTE — Patient Instructions (Signed)

## 2023-04-09 NOTE — Progress Notes (Unsigned)
Nathaniel Appointments  Date Time Provider Department  04/10/2023                   9 mo ov   9:30 AM [name], Nathaniel [facility]  04/28/2023  8:20 AM Doristine Locks Gill, Nathaniel [facility]  07/18/2023                    cpe            9:00 AM Raynelle Gill, Nathaniel Gill  9:30 AM Adela Glimpse, Nathaniel [facility]    History of Present Illness:       This very nice 72 y.o.  MWM  with HTN, HLD, Pre-Diabetes, GERD  and Vitamin D Deficiency who presents for quarterly  follow up .  CXR in 2020 showed Aortic Atherosclerosis.         Patient is treated for HTN circa 2019   & BP has been controlled at home. Today's BP is at goal -  110/70. Patient has had no complaints of any cardiac type chest pain, palpitations, dyspnea Pollyann Kennedy /PND, dizziness, claudication or dependent edema.        Hyperlipidemia (2018)  is controlled with diet & Rosuvastatin /Ezetimibe . Patient denies myalgias or other med SE's. 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   He  has had no symptoms of reactive hypoglycemia, diabetic polys, paresthesias or visual blurring.  Last A1c was not at  goal :  Lab Results  Component Value Date   HGBA1C 6.2 (H) 01/03/2023                                                       Further, the patient also has history of Vitamin D Deficiency ("19" /2018)  and supplements vitamin D . Last vitamin D was still very low :   Lab Results  Component Value Date   VD25OH 32 09/09/2022       Current Outpatient Medications  Medication Instructions   amLODipine 10 MG tab TAKE ONE TABLET DAILY    aspirin  81 mg Daily   Carvedilol     12.5  mg,  2 times daily   Ezetimibe   10 mg  Daily   Gabapentin  300 MG cap Take 1 cap 3 x /day as needed   Magnesium  400 mg Daily   metFORMIN-X 500 MG  Take 2 tablets 2 x /day with Meals    olmesartan-hctz  40-25 MG tab TAKE ONE TABLET  DAILY    ondansetron  4 mg Every 6 hours PRN   rosuvastatin  40 MG tablet TAKE ONE TABLET  DAILY    vitamin C  1,000 mg 2 times daily     Allergies  Allergen Reactions  Other     Pt has cochlear implants - Cannot have MRI due to one of cochlear implants is metal.      PMHx:   Past Medical History:  Diagnosis Date   Aortic atherosclerosis (HCC)    Arthritis    CAD (coronary artery disease) 07/13/2021   Cor Ca++ Score 837.  Mixed mod plaque prox RCA(50 to 69%).  Mild Ca++ plaque mid RCA (25-49%).  Minimal Ca++ plaque prox LCx (0-24%).  Mid LCx CA++ plaque (50 to 69%), Prox OM1 CA++ plaque (50-69%).  Prox LAD mild to 0-24%. LM  mild 0-24% Ca++  plaque. => CT FFR + for distal LCx (small caliber)  Negative findings in the LAD, OM1 and  and RCA. =>  Would not recommend catheterization.   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 Rhea Belton:  Low-grade of narrowing Schatzki ring. Dilated to 18 mm with balloon. - 3 cm hiatal hernia. - Normal stomach. 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    Gallstone    Gastroparesis    GERD (gastroesophageal reflux disease)    Hiatal hernia    History of colon polyps    Hyperlipidemia    Hypertension    No sign of renal artery stenosis   IBS (irritable bowel syndrome)    Inguinal hernia, bilateral    Pre-diabetes    RBBB    Schatzki's ring    Vertigo    Vitamin D deficiency      Immunization History  Administered Date(s) Administered   Influenza Split 08/28/2007    Pneumococcal - 13 04/16/2018   Pneumococcal  - 23 05/20/2019   Tdap 11/19/2010     Past Surgical History:  Procedure Laterality Date   ABDOMINAL SURGERY  1956   Related to bleeding episode.    BIOPSY  01/27/2020   Procedure: BIOPSY;  Surgeon: Meridee Score Netty Starring., Nathaniel;  Location: [facility] ENDOSCOPY;  Service: Gastroenterology;;   BIOPSY  01/21/2021   Procedure: BIOPSY;  Surgeon: Lemar Lofty., Nathaniel;  Location: [facility] ENDOSCOPY;  Service: Gastroenterology;;   CARDIAC CATHETERIZATION  2005   no intervention per patient   CARPAL TUNNEL RELEASE Left 03/06/2020   Dr. Amanda Pea   COCHLEAR IMPLANT Bilateral    COLONOSCOPY     several - Last one 11/22/19   CYST REMOVAL HAND Left 1975   [place]   ENDOSCOPIC MUCOSAL RESECTION N/A 01/27/2020   Procedure: ENDOSCOPIC MUCOSAL RESECTION;  Surgeon: Lemar Lofty., Nathaniel;  Location: [facility] ENDOSCOPY;  Service: Gastroenterology;  Laterality: N/A;   ENTEROSCOPY N/A 01/27/2020   Procedure: ENTEROSCOPY;  Surgeon: Meridee Score Netty Starring., Nathaniel;  Location[facility] ENDOSCOPY;  Service: Gastroenterology;  Laterality: N/A;   ESOPHAGEAL MANOMETRY N/A 05/18/2022   Procedure: ESOPHAGEAL MANOMETRY (EM);  Surgeon: Beverley Fiedler, Nathaniel;  Location: [facility] ENDOSCOPY;  Service: Gastroenterology;  Laterality: N/A;   ESOPHAGOGASTRODUODENOSCOPY N/A 06/17/2022   Procedure: ESOPHAGOGASTRODUODENOSCOPY (EGD);  Surgeon: Shellia Cleverly, Nathaniel;  Location: [facility] ORS;  Service: Gastroenterology;  Laterality: N/A;   ESOPHAGOGASTRODUODENOSCOPY (EGD) WITH PROPOFOL N/A 01/21/2021   Procedure: ESOPHAGOGASTRODUODENOSCOPY (EGD) WITH PROPOFOL;  Surgeon: Meridee Score Netty Starring., Nathaniel;  Location: [facility] ENDOSCOPY;  Service: Gastroenterology;  Laterality: N/A;   HEMOSTASIS CLIP PLACEMENT  01/27/2020   Procedure: HEMOSTASIS CLIP PLACEMENT;  Surgeon: Lemar Lofty.,  Nathaniel;  Location: [facility] ENDOSCOPY;  Service: Gastroenterology;;   HIATAL HERNIA REPAIR N/A 06/17/2022   Procedure: LAPAROSCOPIC REPAIR OF HIATAL HERNIA  WITH TIF;  Surgeon: Gaynelle Adu, Nathaniel;  Location: [facility] ORS;  Service: General;  Laterality: N/A;   SKIN LESION EXCISION     STAPEDECTOMY Bilateral    x 2 - 10 yrs apart - No MRIs per patient.   SUBMUCOSAL LIFTING INJECTION  01/27/2020   Procedure: SUBMUCOSAL LIFTING INJECTION;  Surgeon: Meridee Score Netty Starring., Nathaniel;  Location: [facility] ENDOSCOPY;  Service: Gastroenterology;;   SUBMUCOSAL TATTOO INJECTION  01/27/2020   Procedure: SUBMUCOSAL TATTOO INJECTION;  Surgeon: Lemar Lofty., Nathaniel;  Location: [facility] ENDOSCOPY;  Service: Gastroenterology;;   TRANSORAL INCISIONLESS FUNDOPLICATION N/A 06/17/2022   Procedure: TRANSORAL INCISIONLESS FUNDOPLICATION;  Surgeon: Shellia Cleverly, Nathaniel;  Location: [facility] ORS;  Service: Gastroenterology;  Laterality: N/A;   TRANSTHORACIC ECHOCARDIOGRAM  07/2017   Normal LV size and function.  EF 55-60%.  No RWMA.  Basal septal hypertrophy, but no sign of significant hypertensive heart disease.  Only GR 1 DD.   TRIGGER FINGER RELEASE Right 2014   Thumb   UPPER GI ENDOSCOPY     several - last one 11/22/2019   WISDOM TOOTH EXTRACTION       FHx:    Reviewed / unchanged   SHx:    Reviewed / unchanged    Systems Review:  Constitutional: Denies fever, chills, wt changes, headaches, insomnia, fatigue, night sweats, change in appetite. Eyes: Denies redness, blurred vision, diplopia, discharge, itchy, watery eyes.  ENT: Denies discharge, congestion, post nasal drip, epistaxis, sore throat, earache, hearing loss, dental pain, tinnitus, vertigo, sinus pain, snoring.  CV: Denies chest pain, palpitations, irregular heartbeat, syncope, dyspnea, diaphoresis, orthopnea, PND, claudication or edema. Respiratory: denies cough, dyspnea, DOE, pleurisy, hoarseness, laryngitis, wheezing.  Gastrointestinal: Denies dysphagia, odynophagia, heartburn, reflux, water brash, abdominal pain or cramps, nausea, vomiting, bloating, diarrhea, constipation, hematemesis, melena, hematochezia  or  hemorrhoids. Genitourinary: Denies dysuria, frequency, urgency, nocturia, hesitancy, discharge, hematuria or flank pain. Musculoskeletal: Denies arthralgias, myalgias, stiffness, jt. swelling, pain, limping or strain/sprain.  Skin: Denies pruritus, rash, hives, warts, acne, eczema or change in skin lesion(s). Neuro: No weakness, tremor, incoordination, spasms, paresthesia or pain. Psychiatric: Denies confusion, memory loss or sensory loss. Endo: Denies change in weight, skin or hair change.  Heme/Lymph: No excessive bleeding, bruising or enlarged lymph nodes.   Physical Exam  There were no vitals taken for this visit.  Appears  well nourished, well groomed  and in no distress.  Eyes: PERRLA, EOMs, conjunctiva no swelling or erythema. Sinuses: No frontal/maxillary tenderness ENT/Mouth: EAC's clear, TM's nl w/o erythema, bulging. Nares clear w/o erythema, swelling, exudates. Oropharynx clear without erythema or exudates. Oral hygiene is good. Tongue normal, non obstructing. Hearing intact.  Neck: Supple. Thyroid not palpable. Car 2+/2+ without bruits, nodes or JVD. Chest: Respirations nl with BS clear & equal w/o rales, rhonchi, wheezing or stridor.  Cor: Heart sounds normal w/ regular rate and rhythm without sig. murmurs, gallops, clicks or rubs. Peripheral pulses normal and equal  without edema.  Abdomen: Soft & bowel sounds normal. Non-tender w/o guarding, rebound, hernias, masses or organomegaly.  Lymphatics: Unremarkable.  Musculoskeletal: Full ROM all peripheral extremities, joint stability, 5/5 strength and normal gait.  Skin: Warm, dry without exposed rashes, lesions or ecchymosis apparent.  Neuro: Cranial nerves intact, reflexes equal bilaterally. Sensory-motor testing grossly intact. Tendon reflexes grossly intact.  Pysch: Alert & oriented x 3.  Insight and judgement nl &  appropriate. No ideations.   Assessment and Plan:  1. Essential hypertension  - Continue medication,  monitor blood pressure at home.  - Continue DASH diet.  Reminder to go to the ER if any CP,  SOB, nausea, dizziness, severe HA, changes vision/speech.    - CBC with Differential/Platelet - COMPLETE METABOLIC PANEL WITH GFR - Magnesium - TSH  2. Hyperlipidemia associated with type 2 diabetes mellitus (HCC)  - Continue diet/meds, exercise,& lifestyle modifications.  - Continue monitor periodic cholesterol/liver & renal functions     - Lipid panel - TSH  3. Type 2 diabetes mellitus with stage 2 chronic kidney                                                   disease, without long-term current use of insulin (HCC)  - Continue diet, exercise  - Lifestyle modifications.  - Monitor appropriate lab    - Hemoglobin A1c - Insulin, random  4. Vitamin D deficiency  - Continue supplementation   - VITAMIN D 25 Hydroxy  5. Atherosclerosis of aorta (HCC) by CXR 2020  - Lipid panel  6. Medication management  - CBC with Differential/Platelet - COMPLETE METABOLIC PANEL WITH GFR - Magnesium - Lipid panel - TSH - Hemoglobin A1c - Insulin, random - VITAMIN D 25 Hydroxy           Discussed  regular exercise, BP monitoring, weight control to achieve/maintain BMI less than 25 and discussed med and SE's. Recommended labs to assess /monitor clinical status .  I discussed the assessment and treatment plan with the patient. The patient was provided an opportunity to ask questions and all were answered. The patient agreed with the plan and demonstrated an understanding of the instructions.  I provided over 30 minutes of exam, counseling, chart review and  complex critical decision making.        The patient was advised to call back or seek an in-person evaluation if the symptoms worsen or if the condition fails to improve as anticipated.   Marinus Maw, Nathaniel

## 2023-04-10 ENCOUNTER — Encounter: Payer: Self-pay | Admitting: Internal Medicine

## 2023-04-10 ENCOUNTER — Ambulatory Visit (INDEPENDENT_AMBULATORY_CARE_PROVIDER_SITE_OTHER): Payer: Medicare Other | Admitting: Internal Medicine

## 2023-04-10 VITALS — BP 118/68 | HR 63 | Temp 97.5°F | Resp 16 | Ht 73.0 in | Wt 226.0 lb

## 2023-04-10 DIAGNOSIS — E1169 Type 2 diabetes mellitus with other specified complication: Secondary | ICD-10-CM

## 2023-04-10 DIAGNOSIS — Z79899 Other long term (current) drug therapy: Secondary | ICD-10-CM

## 2023-04-10 DIAGNOSIS — I10 Essential (primary) hypertension: Secondary | ICD-10-CM

## 2023-04-10 DIAGNOSIS — R202 Paresthesia of skin: Secondary | ICD-10-CM | POA: Diagnosis not present

## 2023-04-10 DIAGNOSIS — E785 Hyperlipidemia, unspecified: Secondary | ICD-10-CM

## 2023-04-10 DIAGNOSIS — N182 Chronic kidney disease, stage 2 (mild): Secondary | ICD-10-CM

## 2023-04-10 DIAGNOSIS — E538 Deficiency of other specified B group vitamins: Secondary | ICD-10-CM | POA: Diagnosis not present

## 2023-04-10 DIAGNOSIS — E559 Vitamin D deficiency, unspecified: Secondary | ICD-10-CM | POA: Diagnosis not present

## 2023-04-10 DIAGNOSIS — I700 Atherosclerosis of aorta: Secondary | ICD-10-CM

## 2023-04-10 DIAGNOSIS — E1122 Type 2 diabetes mellitus with diabetic chronic kidney disease: Secondary | ICD-10-CM | POA: Diagnosis not present

## 2023-04-11 LAB — VITAMIN B12: Vitamin B-12: 902 pg/mL (ref 200–1100)

## 2023-04-11 NOTE — Progress Notes (Signed)
<>*<>*<>*<>*<>*<>*<>*<>*<>*<>*<>*<>*<>*<>*<>*<>*<>*<>*<>*<>*<>*<>*<>*<>*<> <>*<>*<>*<>*<>*<>*<>*<>*<>*<>*<>*<>*<>*<>*<>*<>*<>*<>*<>*<>*<>*<>*<>*<>*<>  -  Test results slightly outside the reference range are not unusual. If there is anything important, I will review this with you,  otherwise it is considered normal test values.  If you have further questions,  please do not hesitate to contact me at the office or via My Chart.   <>*<>*<>*<>*<>*<>*<>*<>*<>*<>*<>*<>*<>*<>*<>*<>*<>*<>*<>*<>*<>*<>*<>*<>*<> <>*<>*<>*<>*<>*<>*<>*<>*<>*<>*<>*<>*<>*<>*<>*<>*<>*<>*<>*<>*<>*<>*<>*<>*<>  - Vitamin B12  is Normal   <>*<>*<>*<>*<>*<>*<>*<>*<>*<>*<>*<>*<>*<>*<>*<>*<>*<>*<>*<>*<>*<>*<>*<>*<> <>*<>*<>*<>*<>*<>*<>*<>*<>*<>*<>*<>*<>*<>*<>*<>*<>*<>*<>*<>*<>*<>*<>*<>*<>

## 2023-04-28 ENCOUNTER — Ambulatory Visit: Payer: Medicare Other | Admitting: Gastroenterology

## 2023-04-28 ENCOUNTER — Encounter: Payer: Self-pay | Admitting: Gastroenterology

## 2023-04-28 VITALS — BP 124/76 | HR 72 | Ht 72.0 in | Wt 227.4 lb

## 2023-04-28 DIAGNOSIS — Z9889 Other specified postprocedural states: Secondary | ICD-10-CM | POA: Diagnosis not present

## 2023-04-28 DIAGNOSIS — Z860100 Personal history of colon polyps, unspecified: Secondary | ICD-10-CM

## 2023-04-28 DIAGNOSIS — K219 Gastro-esophageal reflux disease without esophagitis: Secondary | ICD-10-CM | POA: Diagnosis not present

## 2023-04-28 DIAGNOSIS — K317 Polyp of stomach and duodenum: Secondary | ICD-10-CM | POA: Diagnosis not present

## 2023-04-28 NOTE — Patient Instructions (Signed)
Please follow up with Dr. Rhea Belton as needed.   _______________________________________________________  If your blood pressure at your visit was 140/90 or greater, please contact your primary care physician to follow up on this.  _______________________________________________________  If you are age 70 or older, your body mass index should be between 23-30. Your Body mass index is 30.84 kg/m. If this is out of the aforementioned range listed, please consider follow up with your Primary Care Provider.  If you are age 41 or younger, your body mass index should be between 19-25. Your Body mass index is 30.84 kg/m. If this is out of the aformentioned range listed, please consider follow up with your Primary Care Provider.   ________________________________________________________  The [facility] providers would like to encourage you to use [facility] to communicate with providers for non-urgent requests or questions.  Due to long hold times on the telephone, sending your provider a message by [facility] may be a faster and more efficient way to get a response.  Please allow 48 business hours for a response.  Please remember that this is for non-urgent requests.  _______________________________________________________
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
## 2023-05-01 DIAGNOSIS — M2392 Unspecified internal derangement of left knee: Secondary | ICD-10-CM | POA: Diagnosis not present

## 2023-05-01 DIAGNOSIS — M25562 Pain in left knee: Secondary | ICD-10-CM | POA: Diagnosis not present

## 2023-05-21 ENCOUNTER — Emergency Department (HOSPITAL_BASED_OUTPATIENT_CLINIC_OR_DEPARTMENT_OTHER)
Admission: EM | Admit: 2023-05-21 | Discharge: 2023-05-21 | Disposition: A | Discharge status: Home / Self Care | Payer: Medicare Other | Attending: Emergency Medicine | Admitting: Emergency Medicine

## 2023-05-21 ENCOUNTER — Encounter (HOSPITAL_BASED_OUTPATIENT_CLINIC_OR_DEPARTMENT_OTHER): Payer: Self-pay

## 2023-05-21 ENCOUNTER — Emergency Department (HOSPITAL_BASED_OUTPATIENT_CLINIC_OR_DEPARTMENT_OTHER): Payer: Medicare Other | Admitting: Radiology

## 2023-05-21 ENCOUNTER — Other Ambulatory Visit: Payer: Self-pay

## 2023-05-21 DIAGNOSIS — M19072 Primary osteoarthritis, left ankle and foot: Secondary | ICD-10-CM | POA: Diagnosis not present

## 2023-05-21 DIAGNOSIS — Z7982 Long term (current) use of aspirin: Secondary | ICD-10-CM | POA: Diagnosis not present

## 2023-05-21 DIAGNOSIS — M25572 Pain in left ankle and joints of left foot: Secondary | ICD-10-CM | POA: Diagnosis not present

## 2023-05-21 DIAGNOSIS — Z7984 Long term (current) use of oral hypoglycemic drugs: Secondary | ICD-10-CM | POA: Diagnosis not present

## 2023-05-21 DIAGNOSIS — M79672 Pain in left foot: Secondary | ICD-10-CM | POA: Diagnosis not present

## 2023-05-21 DIAGNOSIS — Z79899 Other long term (current) drug therapy: Secondary | ICD-10-CM | POA: Diagnosis not present

## 2023-05-21 DIAGNOSIS — M799 Soft tissue disorder, unspecified: Secondary | ICD-10-CM | POA: Diagnosis not present

## 2023-05-21 MED ORDER — CELECOXIB 200 MG PO CAPS: 200.0000 mg | ORAL_CAPSULE | Freq: Two times a day (BID) | ORAL | 0 refills | Status: DC

## 2023-05-21 NOTE — ED Provider Notes (Signed)
[place] [facility]MENT AT [facility] Provider Note   CSN: [id number] Arrival date & time: 05/21/23  1120     History  Chief Complaint  Patient presents with   Foot Pain    left    Tyre Coady is a 72 y.o. male who presents emergency department with a chief complaint of left lateral foot pain.  This has been ongoing for about a week.  Patient was in [place] visiting family.  He states that for about 2 hours he was playing with his grandson at a football field running around on asphalt and [place]roturf.  He developed pain in the left foot.  His wife was at bedside states that he has been having progressively worsening and severe pain in the left lateral foot.  He is noticed some swelling of the fifth metatarsophalangeal joint.  It hurts worse to bear weight, better with rest but did keep him awake last night.  He has been taking anti-inflammatories and ice without relief.  Previous history of injury.   Foot Pain       Home Medications Prior to Admission medications   Medication Sig Start Date End Date Taking? Authorizing Provider  amLODipine (NORVASC) 10 MG tablet TAKE ONE TABLET BY MOUTH DAILY FOR BLOOD PRESSURE 09/17/21   Judd Gaudier, NP  Ascorbic Acid (VITAMIN C) 1000 MG tablet Take 1,000 mg by mouth in the morning and at bedtime.    [provider]  aspirin 81 MG tablet Take 1 tablet (81 mg total) by mouth daily. 06/20/22   Mansouraty, Netty Starring., MD  blood glucose meter kit and supplies KIT Dispense based on patient and insurance preference. Use up to four times daily as directed. 06/23/22   Raynelle Dick, NP  carvedilol (COREG) 12.5 MG tablet TAKE 1 TABLET BY MOUTH TWICE A DAY 02/15/23   Marykay Lex, MD  ezetimibe (ZETIA) 10 MG tablet TAKE 1 TABLET BY MOUTH DAILY 12/14/22   Raynelle Dick, NP  gabapentin (NEURONTIN) 300 MG capsule TAKE ONE CAPSULE BY MOUTH THREE TIMES A DAY AS NEEDED 06/09/22   Raynelle Dick, NP  Magnesium 400 MG  TABS Take 400 mg by mouth daily.    [provider]  metFORMIN (GLUCOPHAGE-XR) 500 MG 24 hr tablet Take 2 tablets 2 x /day with Meals for Diabetes. 09/10/22   [name], MD  olmesartan-hydrochlorothiazide (BENICAR HCT) 40-25 MG tablet TAKE ONE TABLET BY MOUTH DAILY FOR BLOOD PRESSURE 09/26/22   Raynelle Dick, NP  ondansetron (ZOFRAN) 4 MG tablet Take 1 tablet (4 mg total) by mouth every 6 (six) hours as needed for nausea or vomiting. 06/18/22   Mansouraty, Netty Starring., MD  rosuvastatin (CRESTOR) 40 MG tablet TAKE ONE TABLET BY MOUTH DAILY FOR CHOLESTEROL 06/07/22   Raynelle Dick, NP      Allergies    Other    Review of Systems   Review of Systems  Physical Exam Updated Vital Signs BP (!) 148/77   Pulse 67   Temp 97.6 F (36.4 C)   Resp 16   Ht 6' (1.829 m)   Wt 99.8 kg   SpO2 98%   BMI 29.84 kg/m  Physical Exam Vitals and nursing note reviewed.  Constitutional:      General: He is not in acute distress.    Appearance: He is well-developed. He is not diaphoretic.  HENT:     Head: Normocephalic and atraumatic.  Eyes:     General: No scleral icterus.  Conjunctiva/sclera: Conjunctivae normal.  Cardiovascular:     Rate and Rhythm: Normal rate and regular rhythm.     Heart sounds: Normal heart sounds.     No friction rub.  Pulmonary:     Effort: Pulmonary effort is normal. No respiratory distress.     Breath sounds: Normal breath sounds.  Abdominal:     Palpations: Abdomen is soft.     Tenderness: There is no abdominal tenderness.  Musculoskeletal:     Cervical back: Normal range of motion and neck supple.     Comments: Left foot with large prominence of the first MTP joint along with swelling noted at the fifth TMT.  There is no redness or erythema.  It is minimally tender to palpation.  No obvious deformities.  Skin:    General: Skin is warm and dry.  Neurological:     Mental Status: He is alert.  Psychiatric:        Behavior: Behavior normal.      ED Results / Procedures / Treatments   Labs (all labs ordered are listed, but only abnormal results are displayed) Labs Reviewed - No data to display  EKG None  Radiology DG Foot Complete Left  Result Date: 05/21/2023 CLINICAL DATA:  Left foot pain EXAM: LEFT FOOT - COMPLETE 3+ VIEW COMPARISON:  03/24/2022 FINDINGS: No acute fracture or dislocation. Severe osteoarthritis of the first MTP joint with prominent marginal osteophytes, similar in appearance to the prior study. Up to mild degenerative changes throughout the remainder of the foot, also unchanged. Soft tissue prominence adjacent to the first MTP joint. Otherwise, no focal soft tissue swelling. Atherosclerotic calcifications. IMPRESSION: Severe osteoarthritis of the first MTP joint, similar in appearance to the prior study. No acute findings. Electronically Signed   By: Duanne Guess D.O.   On: 05/21/2023 12:31    Procedures Procedures    Medications Ordered in ED Medications - No data to display  ED Course/ Medical Decision Making/ A&P                                 Medical Decision Making Amount and/or Complexity of Data Reviewed Radiology: ordered.   Patient with significant history of arthritis in the foot.  He has some hypertrophy of the fifth TMT joint.  I visualized and interpreted a foot x-ray which shows no acute findings.  Suspect underlying arthritis with inflammation of the joint versus sprain.  Patient given postop shoe and will be discharged with anti-inflammatories, RICE protocol and dietary follow-up.  Discussed outpatient follow-up and return precautions.        Final Clinical Impression(s) / ED Diagnoses Final diagnoses:  Foot pain, left    Rx / DC Orders ED Discharge Orders     None         Arthor Captain, PA-C 05/21/23 1319    Lorre Nick, MD 05/23/23 1114

## 2023-05-21 NOTE — Discharge Instructions (Addendum)
Contact a health care provider if: Your pain does not get better after a few days of treatment at home. Your pain gets worse. You cannot stand on your foot. Your foot or toes are swollen. Your foot is numb or tingling. Get help right away if: Your foot or toes turn white or blue. You have warmth and redness along your foot.

## 2023-05-21 NOTE — ED Notes (Signed)

## 2023-05-21 NOTE — ED Triage Notes (Signed)
Patient arrives with complaints of left foot pain for over 1 week. Patient states that he was running on track and he is now having worsening pain. No falls or injuries. Rates pain an 8/10.

## 2023-05-22 ENCOUNTER — Ambulatory Visit: Payer: Medicare Other | Admitting: Family Medicine

## 2023-05-22 DIAGNOSIS — M23232 Derangement of other medial meniscus due to old tear or injury, left knee: Secondary | ICD-10-CM | POA: Diagnosis not present

## 2023-05-22 DIAGNOSIS — M23252 Derangement of posterior horn of lateral meniscus due to old tear or injury, left knee: Secondary | ICD-10-CM | POA: Diagnosis not present

## 2023-05-22 DIAGNOSIS — M2392 Unspecified internal derangement of left knee: Secondary | ICD-10-CM | POA: Diagnosis not present

## 2023-05-22 DIAGNOSIS — M948X6 Other specified disorders of cartilage, lower leg: Secondary | ICD-10-CM | POA: Diagnosis not present

## 2023-05-22 DIAGNOSIS — M1712 Unilateral primary osteoarthritis, left knee: Secondary | ICD-10-CM | POA: Diagnosis not present

## 2023-05-23 DIAGNOSIS — M238X2 Other internal derangements of left knee: Secondary | ICD-10-CM | POA: Diagnosis not present

## 2023-05-23 DIAGNOSIS — M25562 Pain in left knee: Secondary | ICD-10-CM | POA: Diagnosis not present

## 2023-05-24 ENCOUNTER — Ambulatory Visit: Payer: Medicare Other | Admitting: Podiatry

## 2023-05-24 DIAGNOSIS — M205X2 Other deformities of toe(s) (acquired), left foot: Secondary | ICD-10-CM

## 2023-05-24 DIAGNOSIS — M7672 Peroneal tendinitis, left leg: Secondary | ICD-10-CM | POA: Diagnosis not present

## 2023-05-24 MED ORDER — CELECOXIB 200 MG PO CAPS: 200.0000 mg | ORAL_CAPSULE | Freq: Two times a day (BID) | ORAL | 0 refills | Status: DC

## 2023-05-24 NOTE — Progress Notes (Signed)
  Subjective:  Patient ID: Nathaniel Gill, male    DOB: 07-Aug-1950,   MRN: [id number]  No chief complaint on file.   72 y.o. male presents for concern of left foot injury. Relates for a bout a week he has been having pain on the outside of the left foot. He was in [place] and played foot ball with his grandson and the pain started after this and has been worsening. He was seen in urgent care and X-rays were negative and advised to follow-up. Has been taking anit-inflammatories. Relates it has been doing better . Denies any other pedal complaints. Denies n/v/f/c.   Past Medical History:  Diagnosis Date   Aortic atherosclerosis (HCC)    Arthritis    CAD (coronary artery disease) 07/13/2021   Cor Ca++ Score 837.  Mixed mod plaque prox RCA(50 to 69%).  Mild Ca++ plaque mid RCA (25-49%).  Minimal Ca++ plaque prox LCx (0-24%).  Mid LCx CA++ plaque (50 to 69%), Prox OM1 CA++ plaque (50-69%).  Prox LAD mild to 0-24%. LM  mild 0-24% Ca++  plaque. => CT FFR + for distal LCx (small caliber)  Negative findings in the LAD, OM1 and  and RCA. =>  Would not recommend catheterization.   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 Rhea Belton:  Low-grade of narrowing Schatzki ring. Dilated to 18 mm with balloon. - 3 cm hiatal hernia. - Normal stomach. 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    Gallstone    Gastroparesis    GERD (gastroesophageal reflux disease)    Hiatal hernia    History of colon polyps    Hyperlipidemia    Hypertension    No sign of renal artery stenosis   IBS (irritable bowel syndrome)    Inguinal hernia, bilateral    Pre-diabetes    RBBB    Schatzki's ring    Vertigo    Vitamin D deficiency     Objective:  Physical Exam: Vascular: DP/PT pulses 2/4  bilateral. CFT <3 seconds. Normal hair growth on digits. No edema.  Skin. No lacerations or abrasions bilateral feet.  Musculoskeletal: MMT 5/5 bilateral lower extremities in DF, PF, Inversion and Eversion. Deceased ROM in DF of ankle joint. Mildly tender to insertion of peroneal tendon on the left in area of os peroneum. No pain with DF PF eversion or inversion. No pain with ROM of the first MPJ very limited ROM. Valgus rotation noted at hallux IPJ.  Neurological: Sensation intact to light touch.   Assessment:   1. Peroneal tendonitis of left lower leg   2. Hallux limitus, left      Plan:  Patient was evaluated and treated and all questions answered. X-rays reviewed and discussed with patient. No acute fractures or dislocations. Os peroneum noted Discussed peroneal tendinitis and treatment options at length with patient Discussed stretching exercises and provided handout. Prescription for Celebrex. provided Continue brace as needed Discussed that if the symptoms do not improve can consider PT/MRI. Patient to return in 6 to 8 weeks or sooner if symptoms fail to improve or worsen.   Louann Sjogren, DPM

## 2023-05-24 NOTE — Patient Instructions (Signed)
Peroneal Tendinopathy Rehab Ask your health care provider which exercises are safe for you. Do exercises exactly as told by your health care provider and adjust them as directed. It is normal to feel mild stretching, pulling, tightness, or discomfort as you do these exercises. Stop right away if you feel sudden pain or your pain gets worse. Do not begin these exercises until told by your health care provider. Stretching and range-of-motion exercises These exercises warm up your muscles and joints. They can help improve the movement and flexibility of your ankle. They may also help to relieve pain and stiffness. Gastrocnemius and soleus stretch, standing This is an exercise in which you stand on a step and use your body weight to stretch your calf muscles. To do this exercise: Stand on the edge of a step on the ball of your left / right foot. The ball of your foot is on the walking surface, right under your toes. Keep your other foot firmly on the same step. Hold on to the wall, a railing, or a chair for balance. Slowly lift your other foot, allowing your body weight to press your left / right heel down over the edge of the step. You should feel a stretch in your left / right calf (gastrocnemius and soleus). Hold this position for __________ seconds. Return both feet to the step. Repeat this exercise with a slight bend in your left / right knee. Repeat __________ times with your left / right knee straight and __________ times with your left / right knee bent. Complete this exercise __________ times a day. Strengthening exercises These exercises build strength and endurance in your foot and ankle. Endurance is the ability to use your muscles for a long time, even after they get tired. Ankle dorsiflexion with band  Secure a rubber exercise band or tube to an object, such as a table leg, that will not move when the band is pulled. Secure the other end of the band around your left / right foot. Sit on  the floor. Face the object with your left / right leg extended. The band or tube should be slightly tense when your foot is relaxed. Slowly flex your left / right ankle and toes to bring your foot toward you (dorsiflexion). Hold this position for __________ seconds. Let the band or tube slowly pull your foot back to the starting position. Repeat __________ times. Complete this exercise __________ times a day. Ankle eversion  Sit on the floor with your legs straight out in front of you. Loop a rubber exercise band or tube around the ball of your left / right foot. The ball of your foot is on the walking surface, right under your toes. Hold the ends of the band in your hands. You can also secure the band to a stable object. The band or tube should be slightly tense when your foot is relaxed. Slowly push your foot outward, away from your other leg (eversion). Hold this position for __________ seconds. Slowly return your foot to the starting position. Repeat __________ times. Complete this exercise __________ times a day. Plantar flexion, standing This exercise is sometimes called a standing heel raise. Stand with your feet shoulder-width apart. Place your hands on a wall or table to steady yourself as needed. Try not to use it for support. Keep your weight spread evenly over the width of your feet while you slowly rise up on your toes (plantar flexion). If told by your health care provider: Shift your weight   toward your left / right leg until you feel challenged. Stand on your left / right leg only. Hold this position for __________ seconds. Repeat __________ times. Complete this exercise __________ times a day. Single leg stand  Without shoes, stand near a railing or in a doorway. You may hold on to the railing or doorframe as needed. Stand on your left / right foot. Keep your big toe down on the floor and try to keep your arch lifted. Do not roll to the outside of your foot. If this  exercise is too easy, you can try it with your eyes closed or while standing on a pillow. Hold this position for __________ seconds. Repeat __________ times. Complete this exercise __________ times a day. This information is not intended to replace advice given to you by your health care provider. Make sure you discuss any questions you have with your health care provider. Document Revised: 10/14/2021 Document Reviewed: 10/14/2021 [employer] Patient Education  2024 [employer]  

## 2023-06-08 ENCOUNTER — Other Ambulatory Visit: Payer: Self-pay

## 2023-06-08 DIAGNOSIS — E1169 Type 2 diabetes mellitus with other specified complication: Secondary | ICD-10-CM

## 2023-06-08 DIAGNOSIS — I700 Atherosclerosis of aorta: Secondary | ICD-10-CM

## 2023-06-08 MED ORDER — ROSUVASTATIN CALCIUM 40 MG PO TABS: ORAL_TABLET | ORAL | 3 refills | Status: DC

## 2023-06-12 ENCOUNTER — Encounter: Payer: Self-pay | Admitting: Nurse Practitioner

## 2023-06-12 DIAGNOSIS — M23301 Other meniscus derangements, unspecified lateral meniscus, left knee: Secondary | ICD-10-CM | POA: Diagnosis not present

## 2023-06-12 DIAGNOSIS — S83282A Other tear of lateral meniscus, current injury, left knee, initial encounter: Secondary | ICD-10-CM | POA: Diagnosis not present

## 2023-06-12 DIAGNOSIS — M2392 Unspecified internal derangement of left knee: Secondary | ICD-10-CM | POA: Diagnosis not present

## 2023-06-12 DIAGNOSIS — S83242A Other tear of medial meniscus, current injury, left knee, initial encounter: Secondary | ICD-10-CM | POA: Diagnosis not present

## 2023-06-12 DIAGNOSIS — M23304 Other meniscus derangements, unspecified medial meniscus, left knee: Secondary | ICD-10-CM | POA: Diagnosis not present

## 2023-06-12 DIAGNOSIS — M1712 Unilateral primary osteoarthritis, left knee: Secondary | ICD-10-CM | POA: Diagnosis not present

## 2023-06-15 ENCOUNTER — Ambulatory Visit: Payer: Medicare Other | Admitting: Podiatry

## 2023-06-15 DIAGNOSIS — M2392 Unspecified internal derangement of left knee: Secondary | ICD-10-CM | POA: Diagnosis not present

## 2023-06-19 DIAGNOSIS — M2392 Unspecified internal derangement of left knee: Secondary | ICD-10-CM | POA: Diagnosis not present

## 2023-06-20 ENCOUNTER — Other Ambulatory Visit: Payer: Self-pay | Admitting: Nurse Practitioner

## 2023-06-26 ENCOUNTER — Encounter: Payer: Self-pay | Admitting: Internal Medicine

## 2023-06-26 DIAGNOSIS — M2392 Unspecified internal derangement of left knee: Secondary | ICD-10-CM | POA: Diagnosis not present

## 2023-06-26 DIAGNOSIS — Z4789 Encounter for other orthopedic aftercare: Secondary | ICD-10-CM | POA: Diagnosis not present

## 2023-07-02 ENCOUNTER — Other Ambulatory Visit: Payer: Self-pay | Admitting: Podiatry

## 2023-07-02 ENCOUNTER — Encounter: Payer: Self-pay | Admitting: Internal Medicine

## 2023-07-03 DIAGNOSIS — M2392 Unspecified internal derangement of left knee: Secondary | ICD-10-CM | POA: Diagnosis not present

## 2023-07-07 DIAGNOSIS — M2392 Unspecified internal derangement of left knee: Secondary | ICD-10-CM | POA: Diagnosis not present

## 2023-07-12 ENCOUNTER — Ambulatory Visit: Payer: Medicare Other | Admitting: Podiatry

## 2023-07-17 NOTE — Progress Notes (Signed)
 CPE  Assessment and Plan:  Diagnoses and all orders for this visit:  Encounter for Annual Physical Exam with abnormal findings Due annually  Health Maintenance reviewed Healthy lifestyle reviewed and goals set  Atherosclerosis of aorta (HCC) Per imaging, CXR 07/2018 Control blood pressure, cholesterol, glucose, increase exercise.   Coronary calcium  score over 400 He is established with cardiology LDL goal at minimum <70, ideally <55  On rosuvastatin  40 mg; and Zetia  10 mg  Take daily ASA 81 mg Reminder to go to the ER if any CP, SOB, nausea, dizziness, severe HA, changes vision/speech, left arm numbness and tingling and jaw pain.  Resistant hypertension Controlled on current medications:carvedilol  12.5 mg BID and Olmesartan  HCT 40/25 mg every day  Monitor blood pressure at home; call if consistently over 130/80 Continue DASH diet.   Reminder to go to the ER if any CP, SOB, nausea, dizziness, severe HA, changes vision/speech, left arm numbness and tingling and jaw pain.  RBBB (right bundle branch block) Monitor, followed by cardiology  Systolic murmur Normal ECHO Followed by cardiology  T2DM [facility])  Education: Reviewed 'ABCs' of diabetes management (respective goals in parentheses):  A1C (<7), blood pressure (<130/80), and cholesterol (LDL <70) Eye Exam yearly and Dental Exam every 6 months.- diabetes eye exam report requested once complete Dietary recommendations Physical Activity recommendations Aggressive control due to high CCC - discussed and sent in ozempic  starting dose to try. If tolerating 0.5 mg well contact office for 1 mg/week script - A1C  Hyperlipidemia associated with T2DM (HCC) Continue medications - has been well controlled in past with rosuvastatin   LDL goal <70; reviewed low saturated fat, increase soluble fiber  Continue weight management and exercise.  - Lipid panel  Erectile dysfunction associated with T2DM (HCC)  Control blood pressure,  cholesterol, glucose, increase exercise.  Declines med at this time  CKD II associated with T2DM (HCC) Increase fluids, avoid NSAIDS, monitor sugars, will monitor - CMP/GFR - Routine UA with reflex microscopic - Microablumin/creatinine urine ratio  Obesity- BMI 30  Long discussion about weight loss, diet, and exercise Recommended diet heavy in fruits and veggies and low in animal meats, cheeses, and dairy products, appropriate calorie intake Discussed appropriate weight for height Off of phentermine  - limited benefit  Follow up at next visit  Medication management CBC, CMP/GFR, magnesium  Scalp lesion right side Refer to dermatology for evaluation  Paresthesia of right foot Restart Gabapentin  and monitor symptoms  Gastroesophageal reflux disease Well managed on current medications Discussed diet, avoiding triggers and other lifestyle changes  Hiatal hernia Small regular meals, PPI indefinitely as recommended by GI Upcoming repair and TIF  S/p dilation esophageal stricture S/p dilation, continue PPI Denies recurrent sx; he prefers to defer repeat unless symptomatic Upcoming TIF  Duodenal polyp S/p resection; GI recommended 1 year follow up due 01/2022  Colon polyps Recommended 3 year recall, due 2024  Vitamin D  deficiency Continue supplementation Defer vitamin D  level  Arthritis/degenerative disk Continue follow up with ortho, PRN gabapentin  Discussed cardiac risks with oral nsaids - tylenol , topicals encouraged   Screening for Prostate Cancer -PSA   Discussed med's effects and SE's. Screening labs and tests as requested with regular follow-up as recommended. Over 45 minutes of exam, counseling, chart review, coordination of care and critical decision making was performed for establishment of new patient with complete physical.   Future Appointments  Date Time Provider Department Center  09/04/2023  4:20 PM Anner Alm ORN, MD [facility] None  01/03/2024  9:30 AM Laurice President, NP [facility] None  07/17/2024  9:00 AM Jude Lonell BRAVO, NP [facility] None    Plan:   During the course of the visit the patient was educated and counseled about appropriate screening and preventive services including:   Pneumococcal vaccine  Prevnar 13 Influenza vaccine Td vaccine Screening electrocardiogram Bone densitometry screening Colorectal cancer screening Diabetes screening Glaucoma screening Nutrition counseling  Advanced directives: requested  HPI BP 106/60   Pulse 75   Temp (!) 97.5 F (36.4 C)   Ht 6' (1.829 m)   Wt 228 lb 6.4 oz (103.6 kg)   SpO2 97%   BMI 30.98 kg/m   The patient is a very pleasant 73 y.o., presents for CPE. He has Resistant hypertension; Hyperlipidemia associated with type 2 diabetes mellitus (HCC); Degenerative lumbar disc; RBBB (right bundle branch block); Vitamin D  deficiency; Erectile dysfunction associated with type 2 diabetes mellitus (HCC); Overweight (BMI 25.0-29.9); Atherosclerosis of aorta (HCC) by CXR 2020; CKD stage 2 due to type 2 diabetes mellitus (HCC); S/P dilatation of esophageal stricture; Agatston coronary artery calcium  score greater than 400 (1036 on 06/04/2021); Metabolic syndrome; Esophageal dysphagia; Peptic stricture of esophagus; Hypercalcemia; History of repair of hiatal hernia; and History of fundoplication on their problem list.   He is married. First grandson born November 2022.  Recently took early retirement but now now has new job working for [employer], working in associate professor doing IT support, lots of walking, 5-6 miles daily. Planning to retire 09/2022  He had left knee arthroscopy with meniscectomy 06/12/23 with Dr. Tonnie. He denies pain and has good ROM.   He had colonoscopy 03/10/23- repeat due 2029. He Had hiatal hernia repair 06/17/22. Dr. San is doing a TIF- will be on liquid diet after surgery. GERD symptoms have resolved , no meds required.   BMI is Body  mass index is 30.98 kg/m., he has been working on diet and exercise, walking 5-6 miles daily, does weights.  He reports sleeps well, getting 7+ hours daily, wakes up feeling rested.  Wt Readings from Last 3 Encounters:  07/18/23 228 lb 6.4 oz (103.6 kg)  05/21/23 220 lb (99.8 kg)  04/28/23 227 lb 6 oz (103.1 kg)   He has atherosclerosis of aorta noted on imaging (CXR 07/2018). He recently had Ct coronary calcium  showing 3 vessel CAD with total coronary calcium  score of 1036.   His blood pressure has been controlled at home on carvedilol  12.5 mg BID and Olmesartan  HCT 40/25 mg every day , followed by Dr. Anner annually, today their BP is BP: 106/60  BP Readings from Last 3 Encounters:  07/18/23 106/60  05/21/23 (!) 156/82  04/28/23 124/76  He had essentially normal ECHO in 07/2017 obtained for murmur evaluation; attributed to mild septal thickening.   He does workout. He denies chest pain, shortness of breath, dizziness.   He is on cholesterol medication (rosuvastatin  40 mg daily and Zetia  10 mg every day ) and denies myalgias. His cholesterol is not at goal of LDL <70. The cholesterol last visit was:   Lab Results  Component Value Date   CHOL 104 01/03/2023   HDL 42 01/03/2023   LDLCALC 41 01/03/2023   TRIG 120 01/03/2023   CHOLHDL 2.5 01/03/2023    He has been working on diet and exercise for T2DM managed by Metformin  500 mg 2 tabs BID(6.5% 04/2018, 6.6% 02/2019), he is on ASA, ACEi, statin and denies foot ulcerations, increased appetite, nausea,  polydipsia, polyuria and visual  disturbances. He does have erectile dysfunction, but not recently active, has declined sildenafil . Doesn't have glucometer, has always been well controlled.  Last A1C in the office was:  Lab Results  Component Value Date   HGBA1C 6.2 (H) 01/03/2023   Drinks at least 64 ounces of water a day.  He has stable CKD II associated with T2DM monitored via this office:  Lab Results  Component Value Date   EGFR 89  01/03/2023    Patient is on Vitamin D  supplement, ? 4000 IU daily Lab Results  Component Value Date   VD25OH 32 09/09/2022     He denies LUTS or nocturia.  Lab Results  Component Value Date   PSA 1.57 06/09/2022   PSA 1.15 06/16/2021   PSA 1.19 06/16/2020    No results found for: IRON, TIBC, FERRITIN Lab Results  Component Value Date   VITAMINB12 902 04/10/2023   - he reports started on oral supplement with perceived memory improvement.    Current Medications:  Current Outpatient Medications on File Prior to Visit  Medication Sig Dispense Refill   Ascorbic Acid (VITAMIN C) 1000 MG tablet Take 1,000 mg by mouth in the morning and at bedtime.     aspirin  81 MG tablet Take 1 tablet (81 mg total) by mouth daily. 30 tablet    blood glucose meter kit and supplies KIT Dispense based on patient and insurance preference. Use up to four times daily as directed. 1 each 0   carvedilol  (COREG ) 12.5 MG tablet TAKE 1 TABLET BY MOUTH TWICE A DAY 180 tablet 1   celecoxib  (CELEBREX ) 200 MG capsule TAKE 1 CAPSULE BY MOUTH 2 TIMES A DAY 60 capsule 0   ezetimibe  (ZETIA ) 10 MG tablet TAKE 1 TABLET BY MOUTH DAILY 90 tablet 1   gabapentin  (NEURONTIN ) 300 MG capsule TAKE ONE CAPSULE BY MOUTH THREE TIMES A DAY AS NEEDED 90 capsule 1   Magnesium 400 MG TABS Take 400 mg by mouth daily.     metFORMIN  (GLUCOPHAGE -XR) 500 MG 24 hr tablet Take 2 tablets 2 x /day with Meals for Diabetes. 360 tablet 3   olmesartan -hydrochlorothiazide  (BENICAR  HCT) 40-25 MG tablet TAKE ONE TABLET BY MOUTH DAILY FOR BLOOD PRESSURE 90 tablet 3   omeprazole  (PRILOSEC) 40 MG capsule Take by mouth as needed.     rosuvastatin  (CRESTOR ) 40 MG tablet TAKE ONE TABLET BY MOUTH DAILY FOR CHOLESTEROL 90 tablet 3   amLODipine  (NORVASC ) 10 MG tablet TAKE ONE TABLET BY MOUTH DAILY FOR BLOOD PRESSURE (Patient not taking: Reported on 07/18/2023) 90 tablet 3   ondansetron  (ZOFRAN ) 4 MG tablet Take 1 tablet (4 mg total) by mouth every 6 (six)  hours as needed for nausea or vomiting. (Patient not taking: Reported on 07/18/2023) 30 tablet 1   No current facility-administered medications on file prior to visit.   Allergies:  Allergies  Allergen Reactions   Other     Pt has cochlear implants- Cannot have MRI due to one of cochlear implants is metal.    Health Maintenance:  Immunization History  Administered Date(s) Administered   Influenza Split 08/28/2007   Pneumococcal Conjugate-13 04/16/2018   Pneumococcal Polysaccharide-23 05/20/2019   Tdap 11/19/2010    Health Maintenance  Topic Date Due   COVID-19 Vaccine (1) Never done   Zoster Vaccines- Shingrix (1 of 2) Never done   DTaP/Tdap/Td (2 - Td or Tdap) 11/18/2020   OPHTHALMOLOGY EXAM  07/28/2021   FOOT EXAM  06/16/2022   Diabetic kidney evaluation -  Urine ACR  06/10/2023   HEMOGLOBIN A1C  07/06/2023   Diabetic kidney evaluation - eGFR measurement  01/03/2024   Medicare Annual Wellness (AWV)  01/03/2024   Colonoscopy  03/09/2028   Pneumonia Vaccine 80+ Years old  Completed   Hepatitis C Screening  Completed   HPV VACCINES  Aged Out   INFLUENZA VACCINE  Discontinued     Eye Exam: Dr. UDELL [facility], 2024 Dentist: Dr. Rome, [place], last visit 2024  Patient Care Team: Tonita Fallow, MD as PCP - General (Internal Medicine) Anner Alm ORN, MD as PCP - Cardiology (Cardiology) Mansouraty, Aloha Raddle., MD as Consulting Physician (Gastroenterology)  Medical History:  has Resistant hypertension; Hyperlipidemia associated with type 2 diabetes mellitus (HCC); Degenerative lumbar disc; RBBB (right bundle branch block); Vitamin D  deficiency; Erectile dysfunction associated with type 2 diabetes mellitus (HCC); Overweight (BMI 25.0-29.9); Atherosclerosis of aorta (HCC) by CXR 2020; CKD stage 2 due to type 2 diabetes mellitus (HCC); S/P dilatation of esophageal stricture; Agatston coronary artery calcium  score greater than 400 (1036 on 06/04/2021); Metabolic syndrome;  Esophageal dysphagia; Peptic stricture of esophagus; Hypercalcemia; History of repair of hiatal hernia; and History of fundoplication on their problem list. Surgical History:  He  has a past surgical history that includes Cardiac catheterization (2005); Abdominal surgery (1956); Stapedectomy (Bilateral); Trigger finger release (Right, 2014); Cyst removal hand (Left, 1975); Skin lesion excision; transthoracic echocardiogram (07/2017); Colonoscopy; Upper gi endoscopy; Wisdom tooth extraction; Endoscopic mucosal resection (N/A, 01/27/2020); biopsy (01/27/2020); Submucosal lifting injection (01/27/2020); Submucosal tattoo injection (01/27/2020); Hemostasis clip placement (01/27/2020); enteroscopy (N/A, 01/27/2020); Carpal tunnel release (Left, 03/06/2020); Cochlear implant (Bilateral); Esophagogastroduodenoscopy (egd) with propofol  (N/A, 01/21/2021); biopsy (01/21/2021); Esophageal manometry (N/A, 05/18/2022); Hiatal hernia repair (N/A, 06/17/2022); Esophagogastroduodenoscopy (N/A, 06/17/2022); and Transoral incisionless fundoplication (N/A, 06/17/2022). Family History:  His family history includes Arrhythmia in his brother; COPD in his mother; Heart attack in his brother; Heart disease in his mother; Heart failure in his mother; Hyperlipidemia in his brother and brother; Hypertension in his brother and brother; Kidney disease in his father. Social History:   reports that he has never smoked. He has never used smokeless tobacco. He reports that he does not currently use alcohol. He reports that he does not use drugs.     Review of Systems:  Review of Systems  Constitutional:  Negative for malaise/fatigue and weight loss.  HENT:  Negative for hearing loss and tinnitus.   Eyes:  Negative for blurred vision and double vision.  Respiratory:  Negative for cough, sputum production, shortness of breath and wheezing.   Cardiovascular:  Negative for chest pain, palpitations, orthopnea, claudication, leg swelling  and PND.  Gastrointestinal:  Negative for abdominal pain, blood in stool, constipation, diarrhea, heartburn, melena, nausea and vomiting.          Genitourinary: Negative.   Musculoskeletal:  Negative for falls, joint pain and myalgias.  Skin:  Negative for rash.  Neurological:  Negative for dizziness, tingling, sensory change, weakness and headaches.  Endo/Heme/Allergies:  Negative for polydipsia.  Psychiatric/Behavioral: Negative.  Negative for depression, memory loss, substance abuse and suicidal ideas. The patient is not nervous/anxious and does not have insomnia.   All other systems reviewed and are negative.   Physical Exam: Estimated body mass index is 30.98 kg/m as calculated from the following:   Height as of this encounter: 6' (1.829 m).   Weight as of this encounter: 228 lb 6.4 oz (103.6 kg). BP 106/60   Pulse 75   Temp ROLLEN)  97.5 F (36.4 C)   Ht 6' (1.829 m)   Wt 228 lb 6.4 oz (103.6 kg)   SpO2 97%   BMI 30.98 kg/m    General Appearance: Well nourished, in no apparent distress.  Eyes: PERRLA, EOMs, conjunctiva no swelling or erythema, fundal exam deferred to ophth Sinuses: No Frontal/maxillary tenderness  ENT/Mouth: Ext aud canals clear, normal light reflex with TMs without erythema, bulging. Good dentition. No erythema, swelling, or exudate on post pharynx. Tonsils not swollen or erythematous. Hearing normal.  Neck: Supple, thyroid  normal. No bruits  Respiratory: Respiratory effort normal, BS equal bilaterally without rales, rhonchi, wheezing or stridor.  Cardio: RRR without murmurs, rubs or gallops. Brisk peripheral pulses without edema.  Chest: symmetric, with normal excursions and percussion.  Abdomen: Soft, nontender, no guarding, rebound, hernias, masses, or organomegaly.  Lymphatics: Non tender without lymphadenopathy.  Genitourinary: Declines, no concerns Musculoskeletal: Full ROM all peripheral extremities,5/5 strength, and normal gait. Skin: Warm, dry  without rashes, ecchymosis.1 cm red lesion of right scalp Neuro: Cranial nerves intact, reflexes equal bilaterally. Normal muscle tone, no cerebellar symptoms. Sensation intact.  Psych: Awake and oriented X 3, normal affect, Insight and Judgment appropriate.   EKG: NSR, no ST changes  Oretha Weismann FOR[place], NP 9:21 AM [facility]

## 2023-07-18 ENCOUNTER — Encounter: Payer: Self-pay | Admitting: Nurse Practitioner

## 2023-07-18 ENCOUNTER — Ambulatory Visit (INDEPENDENT_AMBULATORY_CARE_PROVIDER_SITE_OTHER): Payer: PPO | Admitting: Nurse Practitioner

## 2023-07-18 VITALS — BP 106/60 | HR 75 | Temp 97.5°F | Ht 72.0 in | Wt 228.4 lb

## 2023-07-18 DIAGNOSIS — Z79899 Other long term (current) drug therapy: Secondary | ICD-10-CM

## 2023-07-18 DIAGNOSIS — K449 Diaphragmatic hernia without obstruction or gangrene: Secondary | ICD-10-CM

## 2023-07-18 DIAGNOSIS — Z0000 Encounter for general adult medical examination without abnormal findings: Secondary | ICD-10-CM

## 2023-07-18 DIAGNOSIS — E669 Obesity, unspecified: Secondary | ICD-10-CM | POA: Diagnosis not present

## 2023-07-18 DIAGNOSIS — Z1329 Encounter for screening for other suspected endocrine disorder: Secondary | ICD-10-CM

## 2023-07-18 DIAGNOSIS — N182 Chronic kidney disease, stage 2 (mild): Secondary | ICD-10-CM | POA: Diagnosis not present

## 2023-07-18 DIAGNOSIS — I700 Atherosclerosis of aorta: Secondary | ICD-10-CM

## 2023-07-18 DIAGNOSIS — E1169 Type 2 diabetes mellitus with other specified complication: Secondary | ICD-10-CM | POA: Diagnosis not present

## 2023-07-18 DIAGNOSIS — Z8719 Personal history of other diseases of the digestive system: Secondary | ICD-10-CM

## 2023-07-18 DIAGNOSIS — K317 Polyp of stomach and duodenum: Secondary | ICD-10-CM

## 2023-07-18 DIAGNOSIS — M5136 Other intervertebral disc degeneration, lumbar region with discogenic back pain only: Secondary | ICD-10-CM

## 2023-07-18 DIAGNOSIS — I10 Essential (primary) hypertension: Secondary | ICD-10-CM | POA: Diagnosis not present

## 2023-07-18 DIAGNOSIS — Z136 Encounter for screening for cardiovascular disorders: Secondary | ICD-10-CM | POA: Diagnosis not present

## 2023-07-18 DIAGNOSIS — E1122 Type 2 diabetes mellitus with diabetic chronic kidney disease: Secondary | ICD-10-CM

## 2023-07-18 DIAGNOSIS — K2100 Gastro-esophageal reflux disease with esophagitis, without bleeding: Secondary | ICD-10-CM

## 2023-07-18 DIAGNOSIS — E785 Hyperlipidemia, unspecified: Secondary | ICD-10-CM | POA: Diagnosis not present

## 2023-07-18 DIAGNOSIS — Z125 Encounter for screening for malignant neoplasm of prostate: Secondary | ICD-10-CM | POA: Diagnosis not present

## 2023-07-18 DIAGNOSIS — I1A0 Resistant hypertension: Secondary | ICD-10-CM

## 2023-07-18 DIAGNOSIS — R011 Cardiac murmur, unspecified: Secondary | ICD-10-CM

## 2023-07-18 DIAGNOSIS — Z1389 Encounter for screening for other disorder: Secondary | ICD-10-CM | POA: Diagnosis not present

## 2023-07-18 DIAGNOSIS — R931 Abnormal findings on diagnostic imaging of heart and coronary circulation: Secondary | ICD-10-CM

## 2023-07-18 DIAGNOSIS — Z0001 Encounter for general adult medical examination with abnormal findings: Secondary | ICD-10-CM

## 2023-07-18 DIAGNOSIS — E559 Vitamin D deficiency, unspecified: Secondary | ICD-10-CM | POA: Diagnosis not present

## 2023-07-18 NOTE — Patient Instructions (Signed)

## 2023-07-19 ENCOUNTER — Encounter: Payer: Self-pay | Admitting: Nurse Practitioner

## 2023-07-19 LAB — CBC WITH DIFFERENTIAL/PLATELET
Absolute Lymphocytes: 1181 {cells}/uL (ref 850–3900)
Absolute Monocytes: 509 {cells}/uL (ref 200–950)
Basophils Absolute: 67 {cells}/uL (ref 0–200)
Basophils Relative: 1.4 %
Eosinophils Absolute: 456 {cells}/uL (ref 15–500)
Eosinophils Relative: 9.5 %
HCT: 43.3 % (ref 38.5–50.0)
Hemoglobin: 14.2 g/dL (ref 13.2–17.1)
MCH: 30.2 pg (ref 27.0–33.0)
MCHC: 32.8 g/dL (ref 32.0–36.0)
MCV: 92.1 fL (ref 80.0–100.0)
MPV: 9.5 fL (ref 7.5–12.5)
Monocytes Relative: 10.6 %
Neutro Abs: 2587 {cells}/uL (ref 1500–7800)
Neutrophils Relative %: 53.9 %
Platelets: 206 10*3/uL (ref 140–400)
RBC: 4.7 10*6/uL (ref 4.20–5.80)
RDW: 12.8 % (ref 11.0–15.0)
Total Lymphocyte: 24.6 %
WBC: 4.8 10*3/uL (ref 3.8–10.8)

## 2023-07-19 LAB — COMPLETE METABOLIC PANEL WITH GFR
AG Ratio: 2.3 (calc) (ref 1.0–2.5)
ALT: 32 U/L (ref 9–46)
AST: 25 U/L (ref 10–35)
Albumin: 4.5 g/dL (ref 3.6–5.1)
Alkaline phosphatase (APISO): 31 U/L — ABNORMAL LOW (ref 35–144)
BUN: 13 mg/dL (ref 7–25)
CO2: 32 mmol/L (ref 20–32)
Calcium: 9.8 mg/dL (ref 8.6–10.3)
Chloride: 103 mmol/L (ref 98–110)
Creat: 0.88 mg/dL (ref 0.70–1.28)
Globulin: 2 g/dL (ref 1.9–3.7)
Glucose, Bld: 122 mg/dL — ABNORMAL HIGH (ref 65–99)
Potassium: 4.3 mmol/L (ref 3.5–5.3)
Sodium: 142 mmol/L (ref 135–146)
Total Bilirubin: 0.5 mg/dL (ref 0.2–1.2)
Total Protein: 6.5 g/dL (ref 6.1–8.1)
eGFR: 93 mL/min/{1.73_m2} (ref >=60)

## 2023-07-19 LAB — LIPID PANEL
Cholesterol: 124 mg/dL (ref <200)
HDL: 45 mg/dL (ref >=40)
LDL Cholesterol (Calc): 58 mg/dL
Non-HDL Cholesterol (Calc): 79 mg/dL (ref <130)
Total CHOL/HDL Ratio: 2.8 (calc) (ref <5.0)
Triglycerides: 128 mg/dL (ref <150)

## 2023-07-19 LAB — MAGNESIUM: Magnesium: 2 mg/dL (ref 1.5–2.5)

## 2023-07-19 LAB — URINALYSIS, ROUTINE W REFLEX MICROSCOPIC
Bilirubin Urine: NEGATIVE
Glucose, UA: NEGATIVE
Hgb urine dipstick: NEGATIVE
Ketones, ur: NEGATIVE
Leukocytes,Ua: NEGATIVE
Nitrite: NEGATIVE
Protein, ur: NEGATIVE
Specific Gravity, Urine: 1.016 (ref 1.001–1.035)
pH: 6 (ref 5.0–8.0)

## 2023-07-19 LAB — MICROALBUMIN / CREATININE URINE RATIO
Creatinine, Urine: 142 mg/dL (ref 20–320)
Microalb Creat Ratio: 16 mg/g{creat} (ref <30)
Microalb, Ur: 2.3 mg/dL

## 2023-07-19 LAB — HEMOGLOBIN A1C W/OUT EAG: Hgb A1c MFr Bld: 6.7 %{Hb} — ABNORMAL HIGH (ref <5.7)

## 2023-07-19 LAB — TSH: TSH: 2.8 m[IU]/L (ref 0.40–4.50)

## 2023-07-19 LAB — PSA: PSA: 1.76 ng/mL (ref <=4.00)

## 2023-07-19 LAB — VITAMIN D 25 HYDROXY (VIT D DEFICIENCY, FRACTURES): Vit D, 25-Hydroxy: 24 ng/mL — ABNORMAL LOW (ref 30–100)

## 2023-07-31 DIAGNOSIS — Z4789 Encounter for other orthopedic aftercare: Secondary | ICD-10-CM | POA: Diagnosis not present

## 2023-08-10 ENCOUNTER — Encounter: Payer: Self-pay | Admitting: Internal Medicine

## 2023-08-10 DIAGNOSIS — Z7982 Long term (current) use of aspirin: Secondary | ICD-10-CM | POA: Diagnosis not present

## 2023-08-10 DIAGNOSIS — E1169 Type 2 diabetes mellitus with other specified complication: Secondary | ICD-10-CM | POA: Diagnosis not present

## 2023-08-10 DIAGNOSIS — I10 Essential (primary) hypertension: Secondary | ICD-10-CM | POA: Diagnosis not present

## 2023-08-10 DIAGNOSIS — G8929 Other chronic pain: Secondary | ICD-10-CM | POA: Diagnosis not present

## 2023-08-10 DIAGNOSIS — E785 Hyperlipidemia, unspecified: Secondary | ICD-10-CM | POA: Diagnosis not present

## 2023-08-10 DIAGNOSIS — E663 Overweight: Secondary | ICD-10-CM | POA: Diagnosis not present

## 2023-08-14 ENCOUNTER — Ambulatory Visit: Payer: Self-pay | Admitting: Internal Medicine

## 2023-08-14 NOTE — Telephone Encounter (Signed)
  Chief Complaint: testicular bleeding Symptoms: testicular bleeding, 1 to 2 "blood blisters" on testicles & one on leg Frequency: since last week Pertinent Negatives: Patient denies fever, pain, blood in urine Disposition: [] ED /[] Urgent Care (no appt availability in office) / [x] Appointment(In office/virtual)/ []  [facility]/ [] Home Care/ [] Refused Recommended Disposition /[] [facility]/ []  Follow-up with PCP Additional Notes: Patient reports that about a week ago he notices "1 or 2 blood blisters" on his testicles and 1 on his leg. Patient reports they bleed very easily and often break open while showering. Patient reports he does take Aspirin  and was prescribed Celebrex  in December and is concerned this may be the reason for his blood blisters. Per protocol, appt scheduled in office tomorrow 2/11. Patient advised to call back with worsening symptoms. Patient verbalized understanding.     Copied from CRM [id number]. Topic: Clinical - Red Word Triage >> Aug 14, 2023  2:24 PM Brynn Caras wrote: PT sent a MyChart message to his PCP Dr. Cassondra Cliff on 02/06, he was informed shortly afterward that MD Vangie Genet has now passed away, he contacted [facility] to establish a PCP with Dr. Alyne Jules, I reviewed her openings as requested and the soonest I would have been able to schedule him would be in June 2025. He was experiencing bleeding on his testicles, states there is still 3-4 dark spots that may be blood blisters.I asked if he would like to be connected with a triage nurse due to his concerning symptoms. However, he is requesting a callback from a triage nurse from his direct number. Callback [phone] Reason for Disposition  All other penis - scrotum symptoms  (Exception: Painless rash < 24 hours duration.)  Answer Assessment - Initial Assessment Questions 1. SYMPTOM: "What's the main symptom you're concerned about?" (e.g., discharge from penis, rash, pain,  itching, swelling)     Testicular bleeding  2. LOCATION: "Where is the bleeding located?"     Testicular bleeding 1 or 2 spots that look like blood blisters 3. ONSET: "When did bleeding  start?"     Last week 4. PAIN: "Is there any pain?" If Yes, ask: "How bad is it?"  (Scale 1-10; or mild, moderate, severe)     No pain 5. URINE: "Any difficulty passing urine?" If Yes, ask: "When was the last time?"     none 6. CAUSE: "What do you think is causing the symptoms?"     Unsure, maybe blood blisters 7. OTHER SYMPTOMS: "Do you have any other symptoms?" (e.g., fever, abdomen pain, blood in urine)     Bleeding on leg  Protocols used: Penis and Scrotum Symptoms-A-AH

## 2023-08-15 ENCOUNTER — Encounter: Payer: Self-pay | Admitting: Family Medicine

## 2023-08-15 ENCOUNTER — Ambulatory Visit (INDEPENDENT_AMBULATORY_CARE_PROVIDER_SITE_OTHER): Payer: PPO | Admitting: Family Medicine

## 2023-08-15 VITALS — BP 132/70 | HR 71 | Temp 98.3°F | Resp 18 | Ht 72.0 in | Wt 228.6 lb

## 2023-08-15 DIAGNOSIS — I10 Essential (primary) hypertension: Secondary | ICD-10-CM | POA: Insufficient documentation

## 2023-08-15 DIAGNOSIS — R7303 Prediabetes: Secondary | ICD-10-CM | POA: Diagnosis not present

## 2023-08-15 DIAGNOSIS — N501 Vascular disorders of male genital organs: Secondary | ICD-10-CM

## 2023-08-15 DIAGNOSIS — Z7689 Persons encountering health services in other specified circumstances: Secondary | ICD-10-CM

## 2023-08-15 DIAGNOSIS — Z52008 Unspecified donor, other blood: Secondary | ICD-10-CM | POA: Diagnosis not present

## 2023-08-15 MED ORDER — OLMESARTAN MEDOXOMIL-HCTZ 20-12.5 MG PO TABS: 1.0000 | ORAL_TABLET | Freq: Every day | ORAL | 0 refills | Status: DC

## 2023-08-15 NOTE — Patient Instructions (Signed)
Healthy Heart:  Recommend heart healthy/Mediterranean diet, with whole grains, fruits, vegetable, fish, lean meats, nuts, and olive oil. Limit salt. Recommend moderate walking, 3-5 times/week for 30-50 minutes each session. Aim for at least 150 minutes.week. Goal should be pace of 3 miles/hours, or walking 1.5 miles in 30 minutes Recommend avoidance of tobacco products. Avoid excess alcohol.

## 2023-08-15 NOTE — Assessment & Plan Note (Addendum)
Taking olmesartan-hydrochlorothiazide 40-25 mg daily as prescribed.  He is also on carvedilol per cardiology for a "leaky valve in the heart "he is interested in reducing medications if possible.  He monitors his blood pressure regularly, will decrease olmesartan-hydrochlorothiazide to 20-12.5 mg daily with close monitoring of blood pressure.  Follow-up in 2 weeks with blood pressure log.  Denies chest pain, shortness of breath, lower extremity edema, vision changes, headaches.  Recommend DASH diet and moderate exercise.  Blood pressure is well-controlled in the office and with home readings.  Understands if blood pressure readings are greater then 130/80 will go back on previous dose.

## 2023-08-15 NOTE — Assessment & Plan Note (Signed)
Recommend holding future platelet donations for now.

## 2023-08-15 NOTE — Progress Notes (Signed)
New Patient Office Visit  Subjective    Patient ID: Nathaniel Gill, male    DOB: [birth year]  Age: 73 y.o. MRN: [id number]  CC:  Chief Complaint  Patient presents with   Establish Care    Patient is here to establish care with new PCP, He has concerns with bleeding from his left testicles he states that his blood isnt clotting properly, so far its happened 2 x's since January . Also would like to discuss some other minor issues     HPI Nathaniel Gill presents to establish care with this practice. He is new to me. He is transferring his care due to his PCP passing away.   Scrotal bleeding:  From one spot on the scrotum.  Occurred twice so far. Not on anticoagulants. On ASA 81 mg This has occurred on an area on right lower leg. Denies clotting issues except nose bleeds in grade school. Has been donating platelets this year.  Last platelet count in January 206 (lower than usual) Will recheck platelets today.   Pre- diabetes: on metformin XR 500 mg two tablets twice per day. Last A1C in January 2025: 6.7, recheck in April   HTN: interested in decreasing some of his medication doses.  Denies chest pain, shortness of breath, lower extremity swelling, vision changes, and headaches  Taking olmesartan-hydrochlorothiazide 40-25 mg daily as prescribed.  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   Home readings: 113/60, 123/70, 133/74, 120/72, 122/70.               Outpatient Encounter Medications as of 08/15/2023  Medication Sig   Ascorbic Acid (VITAMIN C) 1000 MG tablet Take 1,000 mg by mouth in the morning and at bedtime.   aspirin 81 MG tablet Take 1 tablet (81 mg total) by mouth daily.   carvedilol (COREG) 12.5 MG tablet TAKE 1 TABLET BY MOUTH TWICE A DAY   ezetimibe (ZETIA) 10 MG tablet TAKE 1 TABLET BY MOUTH DAILY   Magnesium 400 MG  TABS Take 400 mg by mouth daily.   metFORMIN (GLUCOPHAGE-XR) 500 MG 24 hr tablet Take 2 tablets 2 x /day with Meals for Diabetes.   olmesartan-hydrochlorothiazide (BENICAR HCT) 20-12.5 MG tablet Take 1 tablet by mouth daily.   omeprazole (PRILOSEC) 40 MG capsule Take by mouth as needed.   rosuvastatin (CRESTOR) 40 MG tablet TAKE ONE TABLET BY MOUTH DAILY FOR CHOLESTEROL   [DISCONTINUED] celecoxib (CELEBREX) 200 MG capsule TAKE 1 CAPSULE BY MOUTH 2 TIMES A DAY   [DISCONTINUED] olmesartan-hydrochlorothiazide (BENICAR HCT) 40-25 MG tablet TAKE ONE TABLET BY MOUTH DAILY FOR BLOOD PRESSURE   blood glucose meter kit and supplies KIT Dispense based on patient and insurance preference. Use up to four times daily as directed. (Patient not taking: Reported on 08/15/2023)   gabapentin (NEURONTIN) 300 MG capsule TAKE ONE CAPSULE BY MOUTH THREE TIMES A DAY AS NEEDED (Patient not taking: Reported on 08/15/2023)   No facility-administered encounter medications on file as of 08/15/2023.    Past Medical History:  Diagnosis Date   Aortic atherosclerosis (HCC)    Arthritis    CAD (coronary artery disease) 07/13/2021   Cor Ca++ Score 837.  Mixed mod plaque prox RCA(50 to 69%).  Mild Ca++ plaque mid RCA (25-49%).  Minimal Ca++ plaque prox LCx (0-24%).  Mid LCx CA++ plaque (50 to 69%), Prox OM1 CA++  plaque (50-69%).  Prox LAD mild to 0-24%. LM  mild 0-24% Ca++  plaque. => CT FFR + for distal LCx (small caliber)  Negative findings in the LAD, OM1 and  and RCA. =>  Would not recommend catheterization.   Clotting disorder (HCC)    Just recently.   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 Rhea Belton:  Low-grade of narrowing Schatzki ring. Dilated to 18 mm with balloon. - 3 cm hiatal hernia. - Normal stomach. - A single duodenal polyp with appearance most  consistent with adenoma. - Duodenal mucosal lymphangiectasias.    Gallstone    Gastroparesis    GERD (gastroesophageal reflux disease)    Hiatal hernia    History of colon polyps    Hyperlipidemia    Hypertension    No sign of renal artery stenosis   IBS (irritable bowel syndrome)    Inguinal hernia, bilateral    Pre-diabetes    RBBB    Schatzki's ring    Vertigo    Vitamin D deficiency     Past Surgical History:  Procedure Laterality Date   ABDOMINAL SURGERY  1956   Related to bleeding episode.    BIOPSY  01/27/2020   Procedure: BIOPSY;  Surgeon: Meridee Score Netty Starring., MD;  Location: [facility] ENDOSCOPY;  Service: Gastroenterology;;   BIOPSY  01/21/2021   Procedure: BIOPSY;  Surgeon: Lemar Lofty., MD;  Location: [facility] ENDOSCOPY;  Service: Gastroenterology;;   CARDIAC CATHETERIZATION  2005   no intervention per patient   CARPAL TUNNEL RELEASE Left 03/06/2020   Dr. Amanda Pea   COCHLEAR IMPLANT Bilateral    COLONOSCOPY     several - Last one 11/22/19   CYST REMOVAL HAND Left 1975   [place]   ENDOSCOPIC MUCOSAL RESECTION N/A 01/27/2020   Procedure: ENDOSCOPIC MUCOSAL RESECTION;  Surgeon: Lemar Lofty., MD;  Location: [facility] ENDOSCOPY;  Service: Gastroenterology;  Laterality: N/A;   ENTEROSCOPY N/A 01/27/2020   Procedure: ENTEROSCOPY;  Surgeon: Meridee Score Netty Starring., MD;  Location: [facility] ENDOSCOPY;  Service: Gastroenterology;  Laterality: N/A;   ESOPHAGEAL MANOMETRY N/A 05/18/2022   Procedure: ESOPHAGEAL MANOMETRY (EM);  Surgeon: Beverley Fiedler, MD;  Location: [facility] ENDOSCOPY;  Service: Gastroenterology;  Laterality: N/A;   ESOPHAGOGASTRODUODENOSCOPY N/A 06/17/2022   Procedure: ESOPHAGOGASTRODUODENOSCOPY (EGD);  Surgeon: Shellia Cleverly, DO;  Location: [facility] ORS;  Service: Gastroenterology;  Laterality: N/A;   ESOPHAGOGASTRODUODENOSCOPY (EGD) WITH PROPOFOL N/A 01/21/2021   Procedure: ESOPHAGOGASTRODUODENOSCOPY (EGD) WITH PROPOFOL;  Surgeon: Meridee Score Netty Starring., MD;  Location:  [facility] ENDOSCOPY;  Service: Gastroenterology;  Laterality: N/A;   HEMOSTASIS CLIP PLACEMENT  01/27/2020   Procedure: HEMOSTASIS CLIP PLACEMENT;  Surgeon: Lemar Lofty., MD;  Location: [facility] ENDOSCOPY;  Service: Gastroenterology;;   HERNIA REPAIR     12-23 with TIFF procedure also.   HIATAL HERNIA REPAIR N/A 06/17/2022   Procedure: LAPAROSCOPIC REPAIR OF HIATAL HERNIA WITH TIF;  Surgeon: Gaynelle Adu, MD;  Location: [facility] ORS;  Service: General;  Laterality: N/A;   SKIN LESION EXCISION     STAPEDECTOMY Bilateral    x 2 - 10 yrs apart - No MRIs per patient.   SUBMUCOSAL LIFTING INJECTION  01/27/2020   Procedure: SUBMUCOSAL LIFTING INJECTION;  Surgeon: Meridee Score Netty Starring., MD;  Location: [facility] ENDOSCOPY;  Service: Gastroenterology;;   SUBMUCOSAL TATTOO INJECTION  01/27/2020  Procedure: SUBMUCOSAL TATTOO INJECTION;  Surgeon: Lemar Lofty., MD;  Location: [facility] ENDOSCOPY;  Service: Gastroenterology;;   TRANSORAL INCISIONLESS FUNDOPLICATION N/A 06/17/2022   Procedure: TRANSORAL INCISIONLESS FUNDOPLICATION;  Surgeon: Shellia Cleverly, DO;  Location: [facility] ORS;  Service: Gastroenterology;  Laterality: N/A;   TRANSTHORACIC ECHOCARDIOGRAM  07/2017   Normal LV size and function.  EF 55-60%.  No RWMA.  Basal septal hypertrophy, but no sign of significant hypertensive heart disease.  Only GR 1 DD.   TRIGGER FINGER RELEASE Right 2014   Thumb   UPPER GI ENDOSCOPY     several - last one 11/22/2019   WISDOM TOOTH EXTRACTION      Family History  Problem Relation Age of Onset   Heart disease Mother        Atrial flutter   COPD Mother    Heart failure Mother    Arthritis Mother    Kidney disease Father    Arrhythmia Brother    Arthritis Brother    Heart disease Brother    Hyperlipidemia Brother    Hypertension Brother    Heart attack Brother    Hypertension Brother    Hyperlipidemia Brother    Arthritis Brother    Hypertension Brother    Hyperlipidemia Brother    Esophageal cancer Neg  Hx    Colon cancer Neg Hx    Colon polyps Neg Hx    Stomach cancer Neg Hx    Ulcerative colitis Neg Hx     Social History   Socioeconomic History   Marital status: Married    Spouse name: Not on file   Number of children: Not on file   Years of education: Not on file   Highest education level: Some college, no degree  Occupational History   Not on file  Tobacco Use   Smoking status: Never    Passive exposure: Never   Smokeless tobacco: Never  Vaping Use   Vaping status: Never Used  Substance and Sexual Activity   Alcohol use: Not Currently    Comment: occasional wine   Drug use: No    Comment: In 27s - cocaine, marijuana   Sexual activity: Not Currently    Partners: Female    Birth control/protection: None  Other Topics Concern   Not on file  Social History Narrative   Not on file   Social Drivers of Health   Financial Resource Strain: Low Risk  (08/14/2023)   Overall Financial Resource Strain (CARDIA)    Difficulty of Paying Living Expenses: Not very hard  Food Insecurity: No Food Insecurity (08/14/2023)   Hunger Vital Sign    Worried About Running Out of Food in the Last Year: Never true    Ran Out of Food in the Last Year: Never true  Transportation Needs: No Transportation Needs (08/14/2023)   PRAPARE - Administrator, Civil Service (Medical): No    Lack of Transportation (Non-Medical): No  Physical Activity: Sufficiently Active (08/14/2023)   Exercise Vital Sign    Days of Exercise per Week: 6 days    Minutes of Exercise per Session: 40 min  Stress: No Stress Concern Present (08/14/2023)   [employer] of Occupational Health - Occupational Stress Questionnaire    Feeling of Stress : Only a little  Social Connections: Socially Integrated (08/14/2023)   Social Connection and Isolation Panel [NHANES]    Frequency of Communication with Friends and Family: Three times a week    Frequency of Social Gatherings with Friends and  Family: Twice a week     Attends Religious Services: More than 4 times per year    Active Member of Clubs or Organizations: Yes    Attends Banker Meetings: More than 4 times per year    Marital Status: Married  Catering manager Violence: Unknown (05/04/2023)   Received from [facility]   HITS    Physically Hurt: Not on file    Insult or Talk Down To: Not on file    Threaten Physical Harm: Not on file    Scream or Curse: Not on file    Review of Systems  Eyes:  Negative for blurred vision and double vision.  Respiratory:  Negative for shortness of breath.   Cardiovascular:  Negative for chest pain and leg swelling.  Neurological:  Negative for dizziness and headaches.        Objective    BP 132/70   Pulse 71   Temp 98.3 F (36.8 C) (Oral)   Resp 18   Ht 6' (1.829 m)   Wt 228 lb 9.6 oz (103.7 kg)   SpO2 98%   BMI 31.00 kg/m   Physical Exam Vitals and nursing note reviewed. Exam conducted with a chaperone present.  Constitutional:      General: He is not in acute distress.    Appearance: Normal appearance.  Pulmonary:     Effort: Pulmonary effort is normal.  Genitourinary:    Comments: 4 small areas on scrotum with the appearance of scabbing.  Skin:    General: Skin is warm and dry.  Neurological:     General: No focal deficit present.     Mental Status: He is alert. Mental status is at baseline.  Psychiatric:        Mood and Affect: Mood normal.        Behavior: Behavior normal.        Thought Content: Thought content normal.        Judgment: Judgment normal.        Assessment & Plan:   Problem List Items Addressed This Visit     Establishing care with new doctor, encounter for - Primary   Essential hypertension   Taking olmesartan-hydrochlorothiazide 40-25 mg daily as prescribed.  He is also on carvedilol per cardiology for a "leaky valve in the heart "he is interested in reducing medications if possible.  He monitors his blood pressure regularly, will  decrease olmesartan-hydrochlorothiazide to 20-12.5 mg daily with close monitoring of blood pressure.  Follow-up in 2 weeks with blood pressure log.  Denies chest pain, shortness of breath, lower extremity edema, vision changes, headaches.  Recommend DASH diet and moderate exercise.  Blood pressure is well-controlled in the office and with home readings.  Understands if blood pressure readings are greater then 130/80 will go back on previous dose.      Relevant Medications   olmesartan-hydrochlorothiazide (BENICAR HCT) 20-12.5 MG tablet   Platelet donor   Recommend holding future platelet donations for now.      Relevant Orders   Platelet count   Prediabetes   Taking metformin 500 mg 2 tablets twice per day.  January 2025 last A1c 6.7.  Will recheck in April.  Continue current medication regimen.  Recommend no added sugar, minimal processed carbohydrates, copious fresh produce, lean proteins, and moderate exercise.      Scrotal bleeding   4 small open areas that appeared scabbed on scrotum.  Reports there has been bleeding several times from these areas.  Reports  that he is a regular platelet donor, instructed to hold future platelet donations for now.  Will check platelets today.  Recommend gentle cleansing of this area to avoid irritation of scabbed areas to prevent bleeding.  If platelet count is normal, and bleeding continues will refer to hematology for further evaluation.     Agrees with plan of care discussed.  Questions answered.   Return in about 2 weeks (around 08/29/2023) for HTN.   Novella Olive, FNP

## 2023-08-15 NOTE — Assessment & Plan Note (Signed)
Taking metformin 500 mg 2 tablets twice per day.  January 2025 last A1c 6.7.  Will recheck in April.  Continue current medication regimen.  Recommend no added sugar, minimal processed carbohydrates, copious fresh produce, lean proteins, and moderate exercise.

## 2023-08-15 NOTE — Assessment & Plan Note (Signed)
4 small open areas that appeared scabbed on scrotum.  Reports there has been bleeding several times from these areas.  Reports that he is a regular platelet donor, instructed to hold future platelet donations for now.  Will check platelets today.  Recommend gentle cleansing of this area to avoid irritation of scabbed areas to prevent bleeding.  If platelet count is normal, and bleeding continues will refer to hematology for further evaluation.

## 2023-08-16 ENCOUNTER — Encounter: Payer: Self-pay | Admitting: Family Medicine

## 2023-08-16 LAB — PLATELET COUNT: Platelets: 290 10*3/uL (ref 150–450)

## 2023-08-29 ENCOUNTER — Encounter: Payer: Self-pay | Admitting: Family Medicine

## 2023-08-29 ENCOUNTER — Ambulatory Visit (INDEPENDENT_AMBULATORY_CARE_PROVIDER_SITE_OTHER): Payer: PPO | Admitting: Family Medicine

## 2023-08-29 VITALS — BP 156/72 | HR 58 | Temp 97.8°F | Resp 18 | Ht 72.0 in | Wt 235.1 lb

## 2023-08-29 DIAGNOSIS — R0689 Other abnormalities of breathing: Secondary | ICD-10-CM | POA: Insufficient documentation

## 2023-08-29 DIAGNOSIS — M79641 Pain in right hand: Secondary | ICD-10-CM | POA: Insufficient documentation

## 2023-08-29 DIAGNOSIS — I10 Essential (primary) hypertension: Secondary | ICD-10-CM

## 2023-08-29 MED ORDER — OLMESARTAN MEDOXOMIL-HCTZ 20-12.5 MG PO TABS: 1.0000 | ORAL_TABLET | Freq: Every day | ORAL | 1 refills | Status: DC

## 2023-08-29 NOTE — Assessment & Plan Note (Signed)
 Wife concerned with excessive yawning. Feels rested. Has lots of energy. Excellent exercise tolerance. No report of weakness, slurred speech, or other concerning symptoms. 5/5 upper extremity strength.  Will continue to monitor. No red flags.

## 2023-08-29 NOTE — Progress Notes (Signed)
 Established Patient Office Visit  Subjective   Patient ID: Nathaniel Gill, male    DOB: [birth year]  Age: 73 y.o. MRN: [id number]  Chief Complaint  Patient presents with   Medical Management of Chronic Issues    Patient is here for a two week follow up for HTN, patient has brought in his BP log, He states that he would like to discuss his medications again and possibly lowering some more if at all possible   Fatigue    Patient has been experiencing fatigue quite a lot lately, he states that he gets 7-8 hours of sleep.    Hand Pain    Patient states that at night when he sleeps his right hand hurts, he states that he unconsciously clenches his fist in his sleep and every morning wakes up in pain. He states that the pain is primarily in his ring finger but still hurts across entire right hand      HPI  Hypertension Medication compliance: Taking olmesartan-hydrochlorothiazide 20-12.5 mg (this is a reduced dose) and carvedilol 12.5 mg BID.  Denies chest pain, shortness of breath, lower extremity edema, vision changes, headaches.  Pertinent lab work: 07/18/23: CMP with GFR 93.  Monitoring at home: 109-155/62-92 (pressures are higher in the  evening).  Tolerating medication well: no side effects reported.  Continue current medication regimen: no changes.  Follow-up: 5 months for labs   Fatigue/excessive yawning:  labs per previous PCP in January, essentially normal. Feels he sleeps well. Has lots of energy through out the day.  Stands for 4-5 hours for work.  Wife is concerned about excessive yawning.  Feels well. Does not feel tired after sleeping.  Denies loud snoring.  Does not sound like sleep apnea.  Exercises without difficulty.     Right hand in a fist: wakes up in the middle of the night and it is painful. Negative Phalens and Tinels. Tenderness with palpation  of 4th and 5th fingers. Declines x-ray today.  Chart review:  07/18/23: CMP with normal electrolytes and  GFR.   Review of Systems  Eyes:  Negative for blurred vision and double vision.  Respiratory:  Negative for shortness of breath.   Cardiovascular:  Negative for chest pain and leg swelling.  Musculoskeletal:  Positive for joint pain (right 4th and 5th fingers).  Neurological:  Negative for dizziness, sensory change, speech change, weakness and headaches.      Objective:     BP (!) 156/72 (BP Location: Right Arm, Patient Position: Sitting, Cuff Size: Normal)   Pulse (!) 58   Temp 97.8 F (36.6 C) (Oral)   Resp 18   Ht 6' (1.829 m)   Wt 235 lb 1.6 oz (106.6 kg)   SpO2 97%   BMI 31.89 kg/m  BP Readings from Last 3 Encounters:  08/29/23 (!) 156/72  08/15/23 132/70  07/18/23 106/60      Physical Exam Vitals and nursing note reviewed.  Constitutional:      General: He is not in acute distress.    Appearance: Normal appearance.  Cardiovascular:     Rate and Rhythm: Normal rate and regular rhythm.     Heart sounds: Normal heart sounds.  Pulmonary:     Effort: Pulmonary effort is normal.     Breath sounds: Normal breath sounds.  Musculoskeletal:     Right hand: Tenderness present. No swelling, deformity or lacerations. Normal range of motion. Normal strength.     Left hand: No swelling, deformity, lacerations or tenderness.  Normal range of motion. Normal strength.     Comments: Negative phalen and tinel signs.   Skin:    General: Skin is warm and dry.  Neurological:     General: No focal deficit present.     Mental Status: He is alert. Mental status is at baseline.      No results found for any visits on 08/29/23.  Last metabolic panel Lab Results  Component Value Date   GLUCOSE 122 (H) 07/18/2023   NA 142 07/18/2023   K 4.3 07/18/2023   CL 103 07/18/2023   CO2 32 07/18/2023   BUN 13 07/18/2023   CREATININE 0.88 07/18/2023   EGFR 93 07/18/2023   CALCIUM 9.8 07/18/2023   PHOS 3.3 05/23/2017   PROT 6.5 07/18/2023   ALBUMIN 4.1 06/18/2022   BILITOT 0.5  07/18/2023   ALKPHOS 34 (L) 06/18/2022   AST 25 07/18/2023   ALT 32 07/18/2023   ANIONGAP 7 06/18/2022      The ASCVD Risk score (Arnett DK, et al., 2019) failed to calculate for the following reasons:   The valid total cholesterol range is 130 to 320 mg/dL    Assessment & Plan:   Problem List Items Addressed This Visit     Essential hypertension   Taking olmesartan-hydrochlorothiazide 20-12.5 mg (this is a reduced dose) and carvedilol 12.5 mg BID.  Denies chest pain, shortness of breath, lower extremity edema, vision changes, headaches.  Home blood pressure readings are mostly at goal. There are some elevations at the end of the day. Elevated in office today. Due to normal pressures at home, will continue with current doses of medications.  Continue to check blood pressure at home and notify provider if consistently >130/80. DASH diet and moderate exercise.  Follow-up in July for routine labs, sooner if needed.        Relevant Medications   olmesartan-hydrochlorothiazide (BENICAR HCT) 20-12.5 MG tablet   Yawning - Primary   Wife concerned with excessive yawning. Feels rested. Has lots of energy. Excellent exercise tolerance. No report of weakness, slurred speech, or other concerning symptoms. 5/5 upper extremity strength.  Will continue to monitor. No red flags.       Pain of right hand   Wakes up in the middle of the night with fist clinched. This has caused some tenderness. Decline x-ray today to assess for osteoarthritis. Recommend wearing a brace to bed to prevent clinching of fist until soreness resolves.  Negative Phalen and Tinel signs.       Agrees with plan of care discussed.  Questions answered.  Total face to face time: 40 minutes discussing current concerns.    Return in about 5 months (around 01/26/2024) for HTN.    Novella Olive, FNP

## 2023-08-29 NOTE — Assessment & Plan Note (Addendum)
 Taking olmesartan-hydrochlorothiazide 20-12.5 mg (this is a reduced dose) and carvedilol 12.5 mg BID.  Denies chest pain, shortness of breath, lower extremity edema, vision changes, headaches.  Home blood pressure readings are mostly at goal. There are some elevations at the end of the day. Elevated in office today. Due to normal pressures at home, will continue with current doses of medications.  Continue to check blood pressure at home and notify provider if consistently >130/80. DASH diet and moderate exercise.  Follow-up in July for routine labs, sooner if needed.

## 2023-08-29 NOTE — Assessment & Plan Note (Addendum)
 Wakes up in the middle of the night with fist clinched. This has caused some tenderness. Decline x-ray today to assess for osteoarthritis. Recommend wearing a brace to bed to prevent clinching of fist until soreness resolves.  Negative Phalen and Tinel signs.

## 2023-09-04 ENCOUNTER — Ambulatory Visit: Payer: PPO | Attending: Cardiology | Admitting: Cardiology

## 2023-09-04 ENCOUNTER — Encounter: Payer: Self-pay | Admitting: Cardiology

## 2023-09-04 VITALS — BP 133/73 | HR 61 | Ht 72.0 in | Wt 232.0 lb

## 2023-09-04 DIAGNOSIS — E1169 Type 2 diabetes mellitus with other specified complication: Secondary | ICD-10-CM

## 2023-09-04 DIAGNOSIS — I1A0 Resistant hypertension: Secondary | ICD-10-CM | POA: Diagnosis not present

## 2023-09-04 DIAGNOSIS — I700 Atherosclerosis of aorta: Secondary | ICD-10-CM | POA: Diagnosis not present

## 2023-09-04 DIAGNOSIS — E8881 Metabolic syndrome: Secondary | ICD-10-CM | POA: Diagnosis not present

## 2023-09-04 DIAGNOSIS — E785 Hyperlipidemia, unspecified: Secondary | ICD-10-CM | POA: Diagnosis not present

## 2023-09-04 DIAGNOSIS — I2510 Atherosclerotic heart disease of native coronary artery without angina pectoris: Secondary | ICD-10-CM | POA: Diagnosis not present

## 2023-09-04 DIAGNOSIS — R7303 Prediabetes: Secondary | ICD-10-CM

## 2023-09-04 DIAGNOSIS — R5383 Other fatigue: Secondary | ICD-10-CM | POA: Diagnosis not present

## 2023-09-04 MED ORDER — CARVEDILOL 12.5 MG PO TABS: 12.5000 mg | ORAL_TABLET | Freq: Two times a day (BID) | ORAL | 1 refills | Status: DC

## 2023-09-04 NOTE — Patient Instructions (Addendum)
 Medication Instructions:    Try  decreasing  your Carvedilol to 6.25 mg ( 1/2 tablet of 12.5 mg)  in the morning for  2- 3 weeks to see if your fatigue gets better -if so continue after the 3 weeks . Continue with taking 12.5 mg in the evening.    Continue to monitor your  blood pressure and let your primary be aware , may need to increase your Olmesartan-HctZ   *If you need a refill on your cardiac medications before your next appointment, please call your pharmacy*   Lab Work:  Not needed   Testing/Procedures:  Not need   Follow-Up: At [facility], you and your health needs are our priority.  As part of our continuing mission to provide you with exceptional heart care, we have created designated Provider Care Teams.  These Care Teams include your primary Cardiologist (physician) and Advanced Practice Providers (APPs -  Physician Assistants and Nurse Practitioners) who all work together to provide you with the care you need, when you need it.     Your next appointment:   6 month(s)  The format for your next appointment:   In Person  Provider:    Edd Fabian, FNP    and then 12 month Bryan Lemma, MD

## 2023-09-04 NOTE — Progress Notes (Unsigned)
 Cardiology Office Note:  .   Date:  09/06/2023  ID:  Nathaniel Gill, DOB Jun 18, 1951, MRN [id number] PCP: Novella Olive, FNP  [facility] Providers Cardiologist:  Bryan Lemma, MD     No chief complaint on file.   Patient Profile: Nathaniel Gill     Nathaniel Gill is a healthy-appearing 73 y.o. male  with a PMH notable for Resistant HTN, HLD, DM-2 (metabolic syndrome) with Elevated Coronary Calcium Score (CAC 1036, by Coronary CTA 837 with moderate-nonobstructive disease) who presents here for delayed annual at the request of [name], MD.  EKG notable for RBBB. Other PMH includes cochlear implant, esophageal stricture, GERD, aortic dilation of roughly 40 mm in January 2023.    I last saw Nathaniel Gill in February 2023 follow-up after his coronary CTA with FFRCT (reviewed below).  He was doing very well.  Exercise routinely.  Walking about 5 to 6 miles a day and doing some cardio conditioning and weightlifting.  No chest pain or dyspnea.  Only exertional dyspnea noted with vigorous exertion.  No changes made.  BP was well-controlled.  He was last seen on August 18, 2022 by Edd Fabian, NP: He was doing well remaining active walking.  Now doing maybe 3.5 miles a day but also working out on a treadmill and doing Silver sneakers at [employer].  He tolerated hernia surgery without difficulty.  Had lost 20 pounds perioperatively but (has gained most of it back).  LDL was 61, BP well-controlled.  Plan was continue current meds and follow-up 1 year.  Subjective  Discussed the use of AI scribe software for clinical note transcription with the patient, who gave verbal consent to proceed.  History of Present Illness   Nathaniel Gill is a 72 year old male with hypertension and diabetes who presents for a follow-up visit.  Blood pressure readings have been slightly elevated, with occasional readings reaching 158/78 mmHg, though generally remaining in the 120s to 130s range. Blood pressure tends to  be higher in the afternoons. He is on a reduced dose of a medication containing HCTZ for blood pressure management. No symptoms of dizziness, fatigue, or palpitations during physical activities.  He experiences pain in his right ring finger, which becomes sharp when attempting to straighten it after waking up with a clenched fist. Additionally, he notes muscle pain in his arm, which he associates with arthritis and possibly nerve impingement.  He is frustrated with his weight, which has not decreased as expected following hernia surgery in December 2023. He is on metformin for diabetes management, with an A1c of 6.7%.  He feels fatigued and lacks energy despite adequate rest, typically sleeping from 11 PM to 7:30 or 8 AM and feeling rested upon waking. He attributes some fatigue to reduced activity during winter months. No chest pain, pressure, tightness, palpitations, dizziness, lightheadedness, shortness of breath, or pain in calves or thighs when walking. No heart racing, skipping, or flipping sensations.      Cardiovascular ROS: no chest pain or dyspnea on exertion positive for - somewhat easily fatigued, feels like he is not fully rested-worse during winter months. negative for - edema, irregular heartbeat, orthopnea, palpitations, paroxysmal nocturnal dyspnea, rapid heart rate, shortness of breath, or syncope or near syncope, TIA or emesis of glycemic claudication.  Melena, hematochezia hematuria or epistaxis.  ROS:  Review of Systems - Negative except symptoms noted in HPI    Objective   Medications - Metformin 500 mg tabs-2 tabs twice  daily -ASA-81 mg daily - Carvedilol 12.5 mg twice daily -Olmesartan-HCTZ 20-12.5 mg daily (PCP had reduced dose from 40-25 mg) - Rosuvastatin 40 mg daily; Zetia 10 mg daily -Omeprazole 40 mg daily. - Celebrex 200 mg daily.  Studies Reviewed: Nathaniel Gill        CAD 07/13/2021  Cor Ca++ Score 837.  Mixed mod plaque prox RCA(50 to 69%).  Mild Ca++ plaque mid  RCA (25-49%).  Minimal Ca++ plaque prox LCx (0-24%).  Mid LCx CA++ plaque (50 to 69%), Prox OM1 CA++ plaque (50-69%).  Prox LAD mild to 0-24%. LM  mild 0-24% Ca++  plaque. => CT FFR + for distal LCx (small caliber)  Negative findings in the LAD, OM1 and  and RCA. =>  Would not recommend catheterization.   . Lab Results  Component Value Date   CHOL 124 07/18/2023   HDL 45 07/18/2023   LDLCALC 58 07/18/2023   TRIG 128 07/18/2023   CHOLHDL 2.8 07/18/2023   Lab Results  Component Value Date   NA 142 07/18/2023   K 4.3 07/18/2023   CREATININE 0.88 07/18/2023   EGFR 93 07/18/2023   GLUCOSE 122 (H) 07/18/2023   Lab Results  Component Value Date   HGBA1C 6.7 (H) 07/18/2023    Risk Assessment/Calculations:         Physical Exam:   VS:  BP 133/73   Pulse 61   Ht 6' (1.829 m)   Wt 232 lb (105.2 kg)   SpO2 90%   BMI 31.46 kg/m    Wt Readings from Last 3 Encounters:  09/04/23 232 lb (105.2 kg)  08/29/23 235 lb 1.6 oz (106.6 kg)  08/15/23 228 lb 9.6 oz (103.7 kg)    GEN: Well nourished, well groomed;  in no acute distress; mildly obese - Healthy appearing NECK: No JVD; No carotid bruits CARDIAC: Normal S1, S2; RRR, no murmurs, rubs, gallops RESPIRATORY:  Clear to auscultation without rales, wheezing or rhonchi ; nonlabored, good air movement. ABDOMEN: Soft, non-tender, non-distended EXTREMITIES:  No edema; No deformity      ASSESSMENT AND PLAN: .    Problem List Items Addressed This Visit       Cardiology Problems   Atherosclerosis of aorta (HCC) by CXR 2020 (Chronic)   Mild dilation noted as well.  Consider follow-up CTA chest in 1 to 2 years.      Relevant Medications   carvedilol (COREG) 12.5 MG tablet   Coronary artery disease, non-occlusive - Primary (Chronic)   Well-managed with no angina during exercise. Moderate arterial narrowing, not severe enough for catheterization. Cholesterol levels controlled on rosuvastatin. - Maintain cholesterol and blood pressure  control: - Continue combination of rosuvastatin 40 mg and Zetia 10 mg daily; carvedilol 12.5 mg twice daily and Benicar HCT 20-12.5 mg daily. - Continue aspirin 81 mg daily for prophylaxis   Okay to hold 5 to 7 days preop for surgeries or procedures. - Monitor for new symptoms of angina or dyspnea.       Relevant Medications   carvedilol (COREG) 12.5 MG tablet   Hyperlipidemia associated with type 2 diabetes mellitus (HCC) (Chronic)   Most recent labs showed LDL of 58-well within goal range. -Continue combination of rosuvastatin 40 mg and Zetia 10 mg daily. -Labs being monitored by PCP.  A1c is 6.7% on metformin. Current A1c does not necessitate insulin therapy. Consider adding Jardiance for cardiovascular protection. - Continue metformin 500 mg-2 tab twice daily. - Consider adding Jardiance 25 mg or Farxiga 10 mg  daily for cardiovascular protection.       Relevant Medications   carvedilol (COREG) 12.5 MG tablet   Resistant hypertension (Chronic)   Blood pressure occasionally elevates to 158/78 mmHg, generally in 120s to 130s. Fatigue may be related to carvedilol. Trial of reducing morning dose of carvedilol suggested. - Continue current antihypertensive regimen. - Monitor blood pressure regularly, especially after adjusting carvedilol dose. - Consider increasing Benicar dose if blood pressure increases after carvedilol dose reduction. - Reduce morning dose of carvedilol by half for 2-3 weeks. - Monitor for changes in energy levels and blood pressure. - Return to regular dose if no improvement in fatigue after 2-3 weeks.      Relevant Medications   carvedilol (COREG) 12.5 MG tablet     Other   Fatigue due to treatment   Difficult to assess if fatigue is truly related to medications or simply just aging.  He is still pretty active though.  Will reduce morning dose of carvedilol to 1/2 tablet and follow symptoms.  If there is no change, he will go back to regular dose. If he  does stay at the lower dose, would probably want to consider titrating up the Benicar-HCTZ back to the 40-25 mg daily dosing.      Metabolic syndrome (Chronic)   Combination of obesity, HTN and DM-2 meets criteria for metabolic syndrome. Factors are relatively well-controlled with lipid and A1c essentially normal range on current meds.  Weight essentially bradycardia stable.      Prediabetes (Chronic)   A1c now 6.7.  Is on metformin 1000 mg twice daily.         Follow-Up: Return in about 6 months (around 03/06/2024).   Signed, Marykay Lex, MD, MS Bryan Lemma, M.D., M.S. Interventional Cardiologist  [facility]  Pager # [phone] Phone # [phone] [address] [place], [place] [address]

## 2023-09-06 DIAGNOSIS — R5383 Other fatigue: Secondary | ICD-10-CM | POA: Insufficient documentation

## 2023-09-06 NOTE — Assessment & Plan Note (Signed)
 Mild dilation noted as well.  Consider follow-up CTA chest in 1 to 2 years.

## 2023-09-06 NOTE — Assessment & Plan Note (Signed)
 Well-managed with no angina during exercise. Moderate arterial narrowing, not severe enough for catheterization. Cholesterol levels controlled on rosuvastatin. - Maintain cholesterol and blood pressure control: - Continue combination of rosuvastatin 40 mg and Zetia 10 mg daily; carvedilol 12.5 mg twice daily and Benicar HCT 20-12.5 mg daily. - Continue aspirin 81 mg daily for prophylaxis   Okay to hold 5 to 7 days preop for surgeries or procedures. - Monitor for new symptoms of angina or dyspnea.

## 2023-09-06 NOTE — Assessment & Plan Note (Signed)
 Combination of obesity, HTN and DM-2 meets criteria for metabolic syndrome. Factors are relatively well-controlled with lipid and A1c essentially normal range on current meds.  Weight essentially bradycardia stable.

## 2023-09-06 NOTE — Assessment & Plan Note (Signed)
 Most recent labs showed LDL of 58-well within goal range. -Continue combination of rosuvastatin 40 mg and Zetia 10 mg daily. -Labs being monitored by PCP.  A1c is 6.7% on metformin. Current A1c does not necessitate insulin therapy. Consider adding Jardiance for cardiovascular protection. - Continue metformin 500 mg-2 tab twice daily. - Consider adding Jardiance 25 mg or Farxiga 10 mg daily for cardiovascular protection.

## 2023-09-06 NOTE — Assessment & Plan Note (Signed)
 A1c now 6.7.  Is on metformin 1000 mg twice daily.

## 2023-09-06 NOTE — Assessment & Plan Note (Signed)
 Blood pressure occasionally elevates to 158/78 mmHg, generally in 120s to 130s. Fatigue may be related to carvedilol. Trial of reducing morning dose of carvedilol suggested. - Continue current antihypertensive regimen. - Monitor blood pressure regularly, especially after adjusting carvedilol dose. - Consider increasing Benicar dose if blood pressure increases after carvedilol dose reduction. - Reduce morning dose of carvedilol by half for 2-3 weeks. - Monitor for changes in energy levels and blood pressure. - Return to regular dose if no improvement in fatigue after 2-3 weeks.

## 2023-09-06 NOTE — Assessment & Plan Note (Signed)
 Difficult to assess if fatigue is truly related to medications or simply just aging.  He is still pretty active though.  Will reduce morning dose of carvedilol to 1/2 tablet and follow symptoms.  If there is no change, he will go back to regular dose. If he does stay at the lower dose, would probably want to consider titrating up the Benicar-HCTZ back to the 40-25 mg daily dosing.

## 2023-09-07 LAB — HM DIABETES EYE EXAM

## 2023-09-08 ENCOUNTER — Encounter: Payer: Self-pay | Admitting: Family Medicine

## 2023-09-11 ENCOUNTER — Other Ambulatory Visit: Payer: Self-pay

## 2023-09-11 DIAGNOSIS — E1122 Type 2 diabetes mellitus with diabetic chronic kidney disease: Secondary | ICD-10-CM

## 2023-09-11 MED ORDER — METFORMIN HCL ER 500 MG PO TB24: ORAL_TABLET | ORAL | 1 refills | Status: DC

## 2023-09-13 DIAGNOSIS — M65341 Trigger finger, right ring finger: Secondary | ICD-10-CM | POA: Diagnosis not present

## 2023-09-13 DIAGNOSIS — M25562 Pain in left knee: Secondary | ICD-10-CM | POA: Diagnosis not present

## 2023-09-13 DIAGNOSIS — M25462 Effusion, left knee: Secondary | ICD-10-CM | POA: Diagnosis not present

## 2023-09-29 ENCOUNTER — Encounter: Payer: Self-pay | Admitting: Family Medicine

## 2023-10-01 ENCOUNTER — Other Ambulatory Visit: Payer: Self-pay | Admitting: Family Medicine

## 2023-10-01 DIAGNOSIS — M79641 Pain in right hand: Secondary | ICD-10-CM

## 2023-10-02 ENCOUNTER — Ambulatory Visit

## 2023-10-02 DIAGNOSIS — M79641 Pain in right hand: Secondary | ICD-10-CM | POA: Diagnosis not present

## 2023-10-02 DIAGNOSIS — M19041 Primary osteoarthritis, right hand: Secondary | ICD-10-CM | POA: Diagnosis not present

## 2023-10-03 ENCOUNTER — Encounter: Payer: Self-pay | Admitting: Family Medicine

## 2023-10-10 ENCOUNTER — Other Ambulatory Visit: Payer: Self-pay | Admitting: Family Medicine

## 2023-10-10 ENCOUNTER — Encounter: Payer: Self-pay | Admitting: Family Medicine

## 2023-10-10 DIAGNOSIS — M13841 Other specified arthritis, right hand: Secondary | ICD-10-CM | POA: Insufficient documentation

## 2023-10-10 DIAGNOSIS — M19041 Primary osteoarthritis, right hand: Secondary | ICD-10-CM | POA: Insufficient documentation

## 2023-10-11 ENCOUNTER — Encounter: Payer: Self-pay | Admitting: Family Medicine

## 2023-10-11 DIAGNOSIS — M25462 Effusion, left knee: Secondary | ICD-10-CM | POA: Diagnosis not present

## 2023-10-13 ENCOUNTER — Other Ambulatory Visit: Payer: Self-pay | Admitting: Family Medicine

## 2023-10-13 DIAGNOSIS — I10 Essential (primary) hypertension: Secondary | ICD-10-CM

## 2023-10-14 ENCOUNTER — Encounter: Payer: Self-pay | Admitting: Family Medicine

## 2023-10-16 DIAGNOSIS — M79641 Pain in right hand: Secondary | ICD-10-CM | POA: Diagnosis not present

## 2023-10-18 ENCOUNTER — Encounter: Payer: Self-pay | Admitting: Family Medicine

## 2023-10-18 ENCOUNTER — Ambulatory Visit (INDEPENDENT_AMBULATORY_CARE_PROVIDER_SITE_OTHER): Admitting: Family Medicine

## 2023-10-18 VITALS — BP 123/76 | HR 61 | Temp 99.1°F | Resp 18 | Ht 72.0 in | Wt 220.0 lb

## 2023-10-18 DIAGNOSIS — R499 Unspecified voice and resonance disorder: Secondary | ICD-10-CM | POA: Diagnosis not present

## 2023-10-18 DIAGNOSIS — M25462 Effusion, left knee: Secondary | ICD-10-CM | POA: Diagnosis not present

## 2023-10-18 NOTE — Assessment & Plan Note (Addendum)
 Working outside on Saturday. Remote injury to throat in 2008. Will continue to monitor, likely related to recent pollen season. If symptoms do not improve, will send to ENT for vocal cord evaluation.

## 2023-10-18 NOTE — Assessment & Plan Note (Addendum)
 Left knee surgery in December. 4 effusions drained since then. Last one on 4/9 with steroid injection. Fluid has not returned. Ortho recommended that he get tested for autoimmune/ RF due to recurrent effusions.  Labs ordered today.

## 2023-10-18 NOTE — Progress Notes (Signed)
 Established Patient Office Visit  Subjective   Patient ID: Nathaniel Gill, male    DOB: 28-Jul-1950  Age: 73 y.o. MRN: [id number]  Chief Complaint  Patient presents with   lab work    Patient is here to get lab work for rheumatoid arthritis     HPI  Having recurrent left knee effusions, ortho specialist recommended that  he get tested for RA.  Has been drained four times per ortho. Got steroid injection, no fluid currently. Pain and fluid has stopped at this point.   Weight gain:  Stopped taking metformin and weight came down and energy increased. Has started eating more fruit, has lost 12 pounds. Stopped metformin recently, wants to wait to get A1C drawn later.   Voice changes: Since Saturday after working outside.  Larynx injury in 2008 Got hit in throat with ball.  Will await ENT referral.   Left finger erythema has resolved.   Pharmacy referral placed to evaluate medications.    ROS    Objective:     BP 123/76   Pulse 61   Temp 99.1 F (37.3 C) (Oral)   Resp 18   Ht 6' (1.829 m)   Wt 220 lb (99.8 kg)   SpO2 96%   BMI 29.84 kg/m  BP Readings from Last 3 Encounters:  10/18/23 123/76  09/04/23 133/73  08/29/23 (!) 156/72      Physical Exam Vitals and nursing note reviewed.  Constitutional:      General: He is not in acute distress.    Appearance: Normal appearance.  Cardiovascular:     Rate and Rhythm: Normal rate and regular rhythm.     Heart sounds: Normal heart sounds.  Pulmonary:     Effort: Pulmonary effort is normal.     Breath sounds: Normal breath sounds.  Skin:    General: Skin is warm and dry.  Neurological:     General: No focal deficit present.     Mental Status: He is alert. Mental status is at baseline.  Psychiatric:        Mood and Affect: Mood normal.        Behavior: Behavior normal.        Thought Content: Thought content normal.        Judgment: Judgment normal.     No results found for any visits on  10/18/23.    The ASCVD Risk score (Arnett DK, et al., 2019) failed to calculate for the following reasons:   The valid total cholesterol range is 130 to 320 mg/dL    Assessment & Plan:   Problem List Items Addressed This Visit     Knee effusion, left - Primary   Left knee surgery in December. 4 effusions drained since then. Last one on 4/9 with steroid injection. Fluid has not returned. Ortho recommended that he get tested for autoimmune/ RF due to recurrent effusions.  Labs ordered today.       Relevant Orders   ANA   CBC with Differential/Platelet   C-reactive protein   Rheumatoid Factor   Sed Rate (ESR)   Change in voice   Working outside on Saturday. Remote injury to throat in 2008. Will continue to monitor, likely related to recent pollen season. If symptoms do not improve, will send to ENT for vocal cord evaluation.       Agrees with plan of care discussed.  Questions answered.   Return for follow-up scheduled in July.    Novella Olive, FNP

## 2023-10-25 ENCOUNTER — Telehealth: Payer: Self-pay

## 2023-10-25 NOTE — Progress Notes (Unsigned)
 Complex Care Management Note Care Guide Note  10/25/2023 Name: Nathaniel Gill MRN: [id number] DOB: 07/14/1950   Complex Care Management Outreach Attempts: An unsuccessful outreach was attempted for an appointment today.  Follow Up Plan:  Additional outreach attempts will be made to offer the patient complex care management information and services.   Encounter Outcome:  Patient Request to Call Back  [facility]  [facility], [facility] Management Assistant Direct Dial: [phone]  Fax: [phone]

## 2023-10-26 ENCOUNTER — Other Ambulatory Visit: Payer: Self-pay | Admitting: Family Medicine

## 2023-10-26 ENCOUNTER — Encounter: Payer: Self-pay | Admitting: Family Medicine

## 2023-10-26 DIAGNOSIS — R768 Other specified abnormal immunological findings in serum: Secondary | ICD-10-CM | POA: Insufficient documentation

## 2023-10-26 LAB — CBC WITH DIFFERENTIAL/PLATELET
Basophils Absolute: 0.1 10*3/uL (ref 0.0–0.2)
Basos: 1 %
EOS (ABSOLUTE): 0.3 10*3/uL (ref 0.0–0.4)
Eos: 3 %
Hematocrit: 44.3 % (ref 37.5–51.0)
Hemoglobin: 14.4 g/dL (ref 13.0–17.7)
Immature Grans (Abs): 0.1 10*3/uL (ref 0.0–0.1)
Immature Granulocytes: 1 %
Lymphocytes Absolute: 1.9 10*3/uL (ref 0.7–3.1)
Lymphs: 20 %
MCH: 29.4 pg (ref 26.6–33.0)
MCHC: 32.5 g/dL (ref 31.5–35.7)
MCV: 90 fL (ref 79–97)
Monocytes Absolute: 1.3 10*3/uL — ABNORMAL HIGH (ref 0.1–0.9)
Monocytes: 14 %
Neutrophils Absolute: 5.8 10*3/uL (ref 1.4–7.0)
Neutrophils: 61 %
Platelets: 312 10*3/uL (ref 150–450)
RBC: 4.9 x10E6/uL (ref 4.14–5.80)
RDW: 12.5 % (ref 11.6–15.4)
WBC: 9.3 10*3/uL (ref 3.4–10.8)

## 2023-10-26 LAB — ANA: ANA Titer 1: POSITIVE — AB

## 2023-10-26 LAB — ENA+DNA/DS+ANTICH+CENTRO+FA...
Anti JO-1: <0.2 AI (ref 0.0–0.9)
Centromere Ab Screen: <0.2 AI (ref 0.0–0.9)
Chromatin Ab SerPl-aCnc: <0.2 AI (ref 0.0–0.9)
ENA RNP Ab: <0.2 AI (ref 0.0–0.9)
ENA SM Ab Ser-aCnc: <0.2 AI (ref 0.0–0.9)
ENA SSA (RO) Ab: <0.2 AI (ref 0.0–0.9)
ENA SSB (LA) Ab: <0.2 AI (ref 0.0–0.9)
Nucleolar Pattern: 1:160 {titer} — ABNORMAL HIGH
Scleroderma (Scl-70) (ENA) Antibody, IgG: <0.2 AI (ref 0.0–0.9)
dsDNA Ab: 9 [IU]/mL (ref 0–9)

## 2023-10-26 LAB — C-REACTIVE PROTEIN: CRP: <1 mg/L (ref 0–10)

## 2023-10-26 LAB — SEDIMENTATION RATE: Sed Rate: 2 mm/h (ref 0–30)

## 2023-10-26 LAB — RHEUMATOID FACTOR: Rheumatoid fact SerPl-aCnc: <10 [IU]/mL (ref <14.0)

## 2023-11-13 DIAGNOSIS — M238X1 Other internal derangements of right knee: Secondary | ICD-10-CM | POA: Diagnosis not present

## 2023-11-13 DIAGNOSIS — M25561 Pain in right knee: Secondary | ICD-10-CM | POA: Diagnosis not present

## 2023-11-13 DIAGNOSIS — I159 Secondary hypertension, unspecified: Secondary | ICD-10-CM | POA: Diagnosis not present

## 2023-11-17 ENCOUNTER — Other Ambulatory Visit: Payer: Self-pay

## 2023-11-17 NOTE — Progress Notes (Signed)
 11/17/2023 Name: JORDAN PARDINI MRN: [id number] DOB: 22-Feb-1951  Chief Complaint  Patient presents with   Medication Management   GLENNIE RODDA is a 73 y.o. year old male who presented for a telephone visit.   They were referred to the pharmacist by their PCP for assistance in managing diabetes.   Subjective:  Care Team: Primary Care Provider: Mickiel Albany, FNP ; Next Scheduled Visit: 01/26/24  Medication Access/Adherence  Current Pharmacy:  [id number], [place] - [address] [place] [address] Phone: [phone] Fax: [phone]  -Patient reports affordability concerns with their medications: No  -Patient reports access/transportation concerns to their pharmacy: No  -Patient reports adherence concerns with their medications:  Yes    Diabetes: Current medications: none -Medications tried in the past: previously taking metformin , but patient prefers to manage with lifestyle modifications to decrease pill burden -Does not monitor home BG regularly -A1c in January 2025 6.7% -Previously prescribed metformin  when diagnosed with pre-diabetes diagnosis and to assist with weight loss, but patient does not endorse much-if any- benefit from medication -Patient does not endorse any s/sx of hypoglycemia or hyperglycemia  Hypertension: Current medications: carvedilol  12.5mg  in the morning and 6.25 in the evening, olmesartan /hydrochlorothiazide  20-12.5mg  daily -Patient has a validated, automated, upper arm home BP cuff -Current blood pressure readings readings: 117/70 -Patient denies hypotensive s/sx including dizziness, lightheadedness.  -Patient denies hypertensive symptoms including headache, chest pain, shortness of breath  Hyperlipidemia/ASCVD Risk Reduction Current lipid lowering medications: rosuvastatin  40mg  daily, ezetimibe  10mg  daily Antiplatelet regimen: ASA 81mg  daily  Objective:  Lab Results   Component Value Date   HGBA1C 6.7 (H) 07/18/2023   Lab Results  Component Value Date   CREATININE 0.88 07/18/2023   BUN 13 07/18/2023   NA 142 07/18/2023   K 4.3 07/18/2023   CL 103 07/18/2023   CO2 32 07/18/2023   Lab Results  Component Value Date   CHOL 124 07/18/2023   HDL 45 07/18/2023   LDLCALC 58 07/18/2023   TRIG 128 07/18/2023   CHOLHDL 2.8 07/18/2023   Medications Reviewed Today     Reviewed by Linn Rich, RPH (Pharmacist) on 11/17/23 at 1205  Med List Status: <None>   Medication Order Taking? Sig Documenting Provider Last Dose Status Informant  aspirin  81 MG tablet [id number] Yes Take 1 tablet (81 mg total) by mouth daily. Mansouraty, Albino Alu., MD Taking Active   blood glucose meter kit and supplies KIT [id number] No Dispense based on patient and insurance preference. Use up to four times daily as directed.  Patient not taking: Reported on 11/17/2023   Wilkinson, Dana E, FNP Not Taking Active   carvedilol  (COREG ) 12.5 MG tablet [id number] Yes Take 1 tablet (12.5 mg total) by mouth 2 (two) times daily. Arleen Lacer, MD Taking Active            Med Note Finley Hugh, [facility] A   Fri Nov 17, 2023 11:52 AM) 1 tablet in the morning and one-half tablet in the evening  celecoxib  (CELEBREX ) 200 MG capsule [id number] Yes Take 200 mg by mouth daily. [provider] Taking Active            Med Note Finley Hugh, Annaka Cleaver A   Fri Nov 17, 2023 11:53 AM) As needed  ezetimibe  (ZETIA ) 10 MG tablet [id number] Yes TAKE 1 TABLET BY MOUTH DAILY Wilkinson, Dana E, FNP Taking Active   olmesartan -hydrochlorothiazide  (BENICAR  HCT) 20-12.5 MG tablet [id number]  Yes Take 1 tablet by mouth daily. Mickiel Albany, FNP Taking Active   rosuvastatin  (CRESTOR ) 40 MG tablet [id number] Yes TAKE ONE TABLET BY MOUTH DAILY FOR CHOLESTEROL Wilkinson, Dana E, FNP Taking Active            Assessment/Plan:   Diabetes: -Currently controlled -Recommend to continue to manage with lifestyle  modifications at this time  -Recommend A1c monitoring at least every 6 months; will need to reconsider pharmacotherapy if this increases to >/=7%  Hypertension: -Currently controlled -Continue current regimen, regular monitoring of home BP, and regular follow-up with PCP  Hyperlipidemia/ASCVD Risk Reduction: -Currently controlled.  -Continue current regimen, and regular follow-up with PCP  Linn Rich, PharmD, DPLA

## 2023-11-28 DIAGNOSIS — M25462 Effusion, left knee: Secondary | ICD-10-CM | POA: Diagnosis not present

## 2023-12-01 DIAGNOSIS — M65961 Unspecified synovitis and tenosynovitis, right lower leg: Secondary | ICD-10-CM | POA: Diagnosis not present

## 2023-12-01 DIAGNOSIS — M948X6 Other specified disorders of cartilage, lower leg: Secondary | ICD-10-CM | POA: Diagnosis not present

## 2023-12-01 DIAGNOSIS — S83206A Unspecified tear of unspecified meniscus, current injury, right knee, initial encounter: Secondary | ICD-10-CM | POA: Diagnosis not present

## 2023-12-01 DIAGNOSIS — M23251 Derangement of posterior horn of lateral meniscus due to old tear or injury, right knee: Secondary | ICD-10-CM | POA: Diagnosis not present

## 2023-12-01 DIAGNOSIS — M23321 Other meniscus derangements, posterior horn of medial meniscus, right knee: Secondary | ICD-10-CM | POA: Diagnosis not present

## 2023-12-01 DIAGNOSIS — M23351 Other meniscus derangements, posterior horn of lateral meniscus, right knee: Secondary | ICD-10-CM | POA: Diagnosis not present

## 2023-12-06 DIAGNOSIS — M238X1 Other internal derangements of right knee: Secondary | ICD-10-CM | POA: Diagnosis not present

## 2023-12-06 DIAGNOSIS — M25462 Effusion, left knee: Secondary | ICD-10-CM | POA: Diagnosis not present

## 2024-01-01 DIAGNOSIS — M11862 Other specified crystal arthropathies, left knee: Secondary | ICD-10-CM | POA: Diagnosis not present

## 2024-01-03 ENCOUNTER — Ambulatory Visit: Payer: PPO | Admitting: Nurse Practitioner

## 2024-01-10 DIAGNOSIS — M2392 Unspecified internal derangement of left knee: Secondary | ICD-10-CM | POA: Diagnosis not present

## 2024-01-10 DIAGNOSIS — M25462 Effusion, left knee: Secondary | ICD-10-CM | POA: Diagnosis not present

## 2024-01-10 DIAGNOSIS — M25562 Pain in left knee: Secondary | ICD-10-CM | POA: Diagnosis not present

## 2024-01-10 DIAGNOSIS — R030 Elevated blood-pressure reading, without diagnosis of hypertension: Secondary | ICD-10-CM | POA: Diagnosis not present

## 2024-01-14 ENCOUNTER — Encounter: Payer: Self-pay | Admitting: Family Medicine

## 2024-01-17 DIAGNOSIS — S83282A Other tear of lateral meniscus, current injury, left knee, initial encounter: Secondary | ICD-10-CM | POA: Diagnosis not present

## 2024-01-17 DIAGNOSIS — S83242A Other tear of medial meniscus, current injury, left knee, initial encounter: Secondary | ICD-10-CM | POA: Diagnosis not present

## 2024-01-17 DIAGNOSIS — M25462 Effusion, left knee: Secondary | ICD-10-CM | POA: Diagnosis not present

## 2024-01-17 DIAGNOSIS — S83522A Sprain of posterior cruciate ligament of left knee, initial encounter: Secondary | ICD-10-CM | POA: Diagnosis not present

## 2024-01-17 DIAGNOSIS — M1712 Unilateral primary osteoarthritis, left knee: Secondary | ICD-10-CM | POA: Diagnosis not present

## 2024-01-18 ENCOUNTER — Ambulatory Visit: Admitting: Family Medicine

## 2024-01-18 DIAGNOSIS — M2392 Unspecified internal derangement of left knee: Secondary | ICD-10-CM | POA: Diagnosis not present

## 2024-01-18 DIAGNOSIS — M25462 Effusion, left knee: Secondary | ICD-10-CM | POA: Diagnosis not present

## 2024-01-22 DIAGNOSIS — M1712 Unilateral primary osteoarthritis, left knee: Secondary | ICD-10-CM | POA: Diagnosis not present

## 2024-01-22 DIAGNOSIS — M25462 Effusion, left knee: Secondary | ICD-10-CM | POA: Diagnosis not present

## 2024-01-24 ENCOUNTER — Ambulatory Visit: Admitting: Family Medicine

## 2024-01-24 ENCOUNTER — Ambulatory Visit (INDEPENDENT_AMBULATORY_CARE_PROVIDER_SITE_OTHER): Admitting: Family Medicine

## 2024-01-24 ENCOUNTER — Encounter: Payer: Self-pay | Admitting: Family Medicine

## 2024-01-24 VITALS — BP 164/86 | HR 66 | Temp 98.0°F | Resp 18 | Ht 73.0 in | Wt 227.0 lb

## 2024-01-24 DIAGNOSIS — E1169 Type 2 diabetes mellitus with other specified complication: Secondary | ICD-10-CM | POA: Diagnosis not present

## 2024-01-24 DIAGNOSIS — I10 Essential (primary) hypertension: Secondary | ICD-10-CM

## 2024-01-24 DIAGNOSIS — E119 Type 2 diabetes mellitus without complications: Secondary | ICD-10-CM | POA: Diagnosis not present

## 2024-01-24 DIAGNOSIS — E559 Vitamin D deficiency, unspecified: Secondary | ICD-10-CM

## 2024-01-24 DIAGNOSIS — E785 Hyperlipidemia, unspecified: Secondary | ICD-10-CM | POA: Diagnosis not present

## 2024-01-24 DIAGNOSIS — Z7984 Long term (current) use of oral hypoglycemic drugs: Secondary | ICD-10-CM | POA: Diagnosis not present

## 2024-01-24 LAB — POCT GLYCOSYLATED HEMOGLOBIN (HGB A1C): Hemoglobin A1C: 6.8 % — AB (ref 4.0–5.6)

## 2024-01-24 MED ORDER — EZETIMIBE 10 MG PO TABS: 10.0000 mg | ORAL_TABLET | Freq: Every day | ORAL | 1 refills | Status: DC

## 2024-01-24 NOTE — Assessment & Plan Note (Addendum)
 Taking metformin  500 mg daily  POC A1C 6.8 today, last A1C 6.7. Stable. Continue current treatment along with diet and exercise. Follow-up in 6 months.

## 2024-01-24 NOTE — Progress Notes (Signed)
 Established Patient Office Visit  Subjective   Patient ID: Nathaniel Gill, male    DOB: 1950-12-25  Age: 73 y.o. MRN: [id number]  Chief Complaint  Patient presents with   Hypertension    A1C    HPI  Hypertension Medication compliance: Taking carvedilol  12.5 mg in the morning and 6.25 mg in the evening. Olmesartan  -hydrochlorothiazide  20-12.5 mg daily. Recently got steroid shot in knee and blood pressure is elevated.  Denies chest pain, shortness of breath, lower extremity edema, vision changes, headaches.  Pertinent lab work: CMP today  Monitoring at home: before Monday: ome readings 106/65, 113/64, 127/80, 135/82 before steroid injection.  Tolerating medication well: no side effects Continue current medication regimen: no changes Follow-up: 6 months  Diabetes: Medication compliance: Taking metformin  500 mg daily  Denies chest pain, shortness of breath, vision changes, polydipsia, polyphagia, polyuria. Denies hypoglycemia.  Pertinent lab work: A1C: 07/18/23 A1C 6.7, today: 6.8 Monitoring: blood sugar readings at home: does not monitor at home.           Continue current medication regimen: no change  Well controlled: POC A1C today:  6.8 Follow-up: 6 months   Hyperlipidemia:  Medication compliance: Taking rosuvastatin  40 mg and ezetimbe 10 mg daily.  Denies chest pain, shortness of breath, lower extremity edema, myalgias, muscle weakness, changes in appearance of urine.  Pertinent lab work: repeat lipid panel today.  Continue current medication regimen: no change, lipid panel when fasting.  Follow-up: 6 months   Vitamin D  deficiency:  Last check 24 Took OTC vitamin D  supplement.    ROS    Objective:     BP (!) 164/86 (Patient Position: Sitting, Cuff Size: Normal)   Pulse 66   Temp 98 F (36.7 C) (Oral)   Resp 18   Ht 6' 1 (1.854 m)   Wt 227 lb (103 kg)   SpO2 95%   BMI 29.95 kg/m    Physical Exam Vitals and nursing note reviewed.  Constitutional:       General: He is not in acute distress.    Appearance: Normal appearance.  Cardiovascular:     Rate and Rhythm: Normal rate and regular rhythm.     Heart sounds: Normal heart sounds.  Pulmonary:     Effort: Pulmonary effort is normal.     Breath sounds: Normal breath sounds.  Skin:    General: Skin is warm and dry.  Neurological:     General: No focal deficit present.     Mental Status: He is alert. Mental status is at baseline.  Psychiatric:        Mood and Affect: Mood normal.        Behavior: Behavior normal.        Thought Content: Thought content normal.        Judgment: Judgment normal.      Results for orders placed or performed in visit on 01/24/24  POCT glycosylated hemoglobin (Hb A1C)  Result Value Ref Range   Hemoglobin A1C 6.8 (A) 4.0 - 5.6 %   HbA1c POC (<> result, manual entry)     HbA1c, POC (prediabetic range)     HbA1c, POC (controlled diabetic range)        The ASCVD Risk score (Arnett DK, et al., 2019) failed to calculate for the following reasons:   The valid total cholesterol range is 130 to 320 mg/dL    Assessment & Plan:   Problem List Items Addressed This Visit     Hyperlipidemia associated  with type 2 diabetes mellitus (HCC) (Chronic)   Taking rosuvastatin  40 mg and ezetimbe 10 mg daily.  Tolerates well. Return when fasting for lipid panel. Ezetimbe 10 mg refilled today.  Follow-up in 6 months.        Relevant Medications   metFORMIN  (GLUCOPHAGE -XR) 500 MG 24 hr tablet   ezetimibe  (ZETIA ) 10 MG tablet   Other Relevant Orders   Lipid panel   Type 2 diabetes mellitus without complication, without long-term current use of insulin  (HCC)   Taking metformin  500 mg daily  POC A1C 6.8 today, last A1C 6.7. Stable. Continue current treatment along with diet and exercise. Follow-up in 6 months.        Relevant Medications   metFORMIN  (GLUCOPHAGE -XR) 500 MG 24 hr tablet   Other Relevant Orders   POCT glycosylated hemoglobin (Hb A1C)  (Completed)   Vitamin D  deficiency   History of deficiency  on OTC supplement. Recheck today.       Relevant Orders   VITAMIN D  25 Hydroxy (Vit-D Deficiency, Fractures)   Essential hypertension - Primary   Taking carvedilol  12.5 mg in the morning and 6.25 mg in the evening. Olmesartan  -hydrochlorothiazide  20-12.5 mg daily. Recently got steroid shot in knee and blood pressure is elevated since that.  Denies chest pain, shortness of breath, lower extremity edema, vision changes, headaches.  CMP Blood pressure not well controlled since steroid injection in left knee. Home readings before injection well controlled. Continue to monitor blood pressure at home and notify provider if does not return to baseline. No refills needed. Follow-up in 6 months, sooner if needed.        Relevant Medications   ezetimibe  (ZETIA ) 10 MG tablet   Other Relevant Orders   Comprehensive metabolic panel with GFR  Agrees with plan of care discussed.  Questions answered.   Return in about 6 months (around 07/26/2024) for HTN, DM, HLD .    Darice JONELLE Brownie, FNP

## 2024-01-24 NOTE — Assessment & Plan Note (Addendum)
 Taking carvedilol  12.5 mg in the morning and 6.25 mg in the evening. Olmesartan  -hydrochlorothiazide  20-12.5 mg daily. Recently got steroid shot in knee and blood pressure is elevated since that.  Denies chest pain, shortness of breath, lower extremity edema, vision changes, headaches.  CMP Blood pressure not well controlled since steroid injection in left knee. Home readings before injection well controlled. Continue to monitor blood pressure at home and notify provider if does not return to baseline. No refills needed. Follow-up in 6 months, sooner if needed.

## 2024-01-24 NOTE — Patient Instructions (Signed)
 You have labs ordered today. Return anytime Monday-Friday from 0800-1130 Mon-Thursday. before you eat if getting your cholesterol checked, otherwise, it does not matter if you eat. We are closed daily from 1200-1300 for lunch and close at 1200 on Friday. Getting your labs done is an important part of assessing our health. If you are on My Chart you will see the labs before I do, but I will review them as soon as I can and send you a message.

## 2024-01-24 NOTE — Assessment & Plan Note (Signed)
 History of deficiency  on OTC supplement. Recheck today.

## 2024-01-24 NOTE — Assessment & Plan Note (Signed)
 Taking rosuvastatin  40 mg and ezetimbe 10 mg daily.  Tolerates well. Return when fasting for lipid panel. Ezetimbe 10 mg refilled today.  Follow-up in 6 months.

## 2024-01-26 ENCOUNTER — Ambulatory Visit: Payer: PPO | Admitting: Family Medicine

## 2024-01-29 ENCOUNTER — Encounter: Payer: Self-pay | Admitting: Gastroenterology

## 2024-01-29 DIAGNOSIS — E1169 Type 2 diabetes mellitus with other specified complication: Secondary | ICD-10-CM | POA: Diagnosis not present

## 2024-01-29 DIAGNOSIS — I10 Essential (primary) hypertension: Secondary | ICD-10-CM | POA: Diagnosis not present

## 2024-01-29 DIAGNOSIS — E785 Hyperlipidemia, unspecified: Secondary | ICD-10-CM | POA: Diagnosis not present

## 2024-01-29 DIAGNOSIS — E559 Vitamin D deficiency, unspecified: Secondary | ICD-10-CM | POA: Diagnosis not present

## 2024-01-30 ENCOUNTER — Other Ambulatory Visit: Payer: Self-pay | Admitting: Family Medicine

## 2024-01-30 ENCOUNTER — Ambulatory Visit: Payer: Self-pay | Admitting: Family Medicine

## 2024-01-30 ENCOUNTER — Encounter: Payer: Self-pay | Admitting: Family Medicine

## 2024-01-30 DIAGNOSIS — R499 Unspecified voice and resonance disorder: Secondary | ICD-10-CM

## 2024-01-30 LAB — COMPREHENSIVE METABOLIC PANEL WITH GFR
ALT: 55 IU/L — ABNORMAL HIGH (ref 0–44)
AST: 33 IU/L (ref 0–40)
Albumin: 4.3 g/dL (ref 3.8–4.8)
Alkaline Phosphatase: 48 IU/L (ref 44–121)
BUN/Creatinine Ratio: 22 (ref 10–24)
BUN: 21 mg/dL (ref 8–27)
Bilirubin Total: 0.7 mg/dL (ref 0.0–1.2)
CO2: 22 mmol/L (ref 20–29)
Calcium: 10.4 mg/dL — ABNORMAL HIGH (ref 8.6–10.2)
Chloride: 100 mmol/L (ref 96–106)
Creatinine, Ser: 0.97 mg/dL (ref 0.76–1.27)
Globulin, Total: 2.2 g/dL (ref 1.5–4.5)
Glucose: 117 mg/dL — ABNORMAL HIGH (ref 70–99)
Potassium: 4.7 mmol/L (ref 3.5–5.2)
Sodium: 137 mmol/L (ref 134–144)
Total Protein: 6.5 g/dL (ref 6.0–8.5)
eGFR: 83 mL/min/1.73 (ref >59)

## 2024-01-30 LAB — LIPID PANEL
Chol/HDL Ratio: 2.2 ratio (ref 0.0–5.0)
Cholesterol, Total: 128 mg/dL (ref 100–199)
HDL: 57 mg/dL (ref >39)
LDL Chol Calc (NIH): 50 mg/dL (ref 0–99)
Triglycerides: 122 mg/dL (ref 0–149)
VLDL Cholesterol Cal: 21 mg/dL (ref 5–40)

## 2024-01-30 LAB — VITAMIN D 25 HYDROXY (VIT D DEFICIENCY, FRACTURES): Vit D, 25-Hydroxy: 33.5 ng/mL (ref 30.0–100.0)

## 2024-02-01 ENCOUNTER — Ambulatory Visit: Admitting: Gastroenterology

## 2024-02-08 ENCOUNTER — Ambulatory Visit: Admitting: Gastroenterology

## 2024-02-08 ENCOUNTER — Encounter: Payer: Self-pay | Admitting: Gastroenterology

## 2024-02-08 VITALS — BP 128/70 | HR 70 | Ht 73.0 in | Wt 220.6 lb

## 2024-02-08 DIAGNOSIS — R499 Unspecified voice and resonance disorder: Secondary | ICD-10-CM

## 2024-02-08 DIAGNOSIS — R09A2 Foreign body sensation, throat: Secondary | ICD-10-CM | POA: Diagnosis not present

## 2024-02-08 DIAGNOSIS — R490 Dysphonia: Secondary | ICD-10-CM

## 2024-02-08 NOTE — Patient Instructions (Addendum)
 Recommend GERD diet, no late meals 3-4 meals before lying down

## 2024-02-08 NOTE — Progress Notes (Signed)
 Chief Complaint: hoarseness, loss of voice Primary GI Doctor: Dr. Albertus  HPI:  73 y.o. male with a history of CAD, DDD, HTN, HLD, Meckel's bleed in 1956  s/p surgical resection, follows with me for longstanding history of GERD requiring concomitant laparoscopic hiatal hernia pair and TIF on 06/17/2022.  Patient last seen in the GI office by Dr. San on 04/28/2023 for routine follow-up.  Longstanding history of GERD for a few years.  Symptoms responsive to high-dose Protonix , but unable to wean to daily dosing due to breakthrough.   GERD history: -Index symptoms: Regurgitation, heartburn, sour brash, belch.  Rare dysphagia -Exacerbating features: Tomato-based sauce, supine -Medications trialed: Pantoprazole , Pepcid  -Current medications: Pantoprazole  40 mg twice daily; starting titration today -Complications: Hiatal hernia, peptic stricture   GERD evaluation: -Last EGD: 05/2022 -Barium esophagram: 09/2019: Small hiatal hernia, moderate distal esophageal mucosal ring with brief sticking of 13 mm barium tablet -Esophageal Manometry: Completed 05/16/2022.  Awaiting final results -pH/Impedance: None -Bravo: None - CT A/P: 04/17/2022: Diverticulosis, otherwise normal-appearing GI tract - HIDA: 05/2022: Normal   Endoscopic History: - EGD (11/22/2019): Mild nonobstructing ring in GEJ dilated with 18 mm TTS balloon, 3 cm HH, Hill grade 4 valve.  10 mm polyp in D2 - Small bowel enteroscopy (01/27/2020): Nonobstructing ring at GE junction, 3 cm HH.  Moderate antral gastritis (path benign).  Duodenal lymphangiectasia.  18 mm semisessile polyp at D2/D3 removed via piecemeal resection with left snare (path: Duodenal adenoma), then the area superior to the polyp was tattooed with spot - EGD (01/23/2021): Nonobstructing ring, 5 cm HH, duodenal scar in the second/third portion of the duodenum adenoma resection (path: Benign small bowel mucosa).  Recommended repeat EGD in 2 years for continued  adenoma surveillance - EGD (05/13/2022): Low-grade peptic stricture dilated with 18 mm TTS balloon, 4 cm HH, Hill grade 4 valve.  Normal stomach.  Tattoo D2.  Polypoid mucosa in D2 (path: Benign), Benign duodenal lymphangiectasia.  Repeat in 2 years for surveillance of duodenal adenoma history - Concomitant laparoscopic hiatal hernia repair and TIF (06/17/2022) --Underwent repeat colonoscopy with Dr. Albertus on 03/10/2023 notable for 5 mm transverse colon SSP, left-sided diverticulosis, small internal hemorrhoids. Recommended repeat in 5 years.   Interval History    Patient presents for evaluation of hoarseness and loss of his voice.  Patient notes he had neck injury 10 years ago where he was hit by baseball  on left side of his neck and caused internal bleeding and bruised his vocal cords. He lost his voice for 3 mths. He also has globus sensation. No cough. No pyrosis or regurgitation. No dysphagia. He has occasional post nasal drip.  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 He notes breaks in sentences or where he will be talking and his voice goes out completely. He is pending follow-up with ENT with Dr. Prentice [facility] next week.  He has Prilosec prn reflux at home, but has not needed it. Not on any allergy medication.  Wt Readings from Last 3 Encounters:  02/08/24 220 lb 9.6 oz (100.1 kg)  01/24/24 227 lb (103 kg)  10/18/23 220 lb (99.8 kg)    Past Medical History:  Diagnosis Date   Aortic atherosclerosis (HCC)    Arthritis    CAD (coronary artery disease) 07/13/2021   Cor Ca++ Score 837.  Mixed mod plaque prox RCA(50 to 69%).  Mild Ca++ plaque mid RCA (25-49%).  Minimal Ca++ plaque prox LCx (0-24%).  Mid LCx CA++ plaque (50 to 69%), Prox OM1 CA++ plaque (50-69%).  Prox LAD mild to 0-24%. LM  mild 0-24% Ca++  plaque. => CT FFR + for distal LCx (small caliber)  Negative findings in the LAD, OM1 and  and RCA. =>  Would not recommend catheterization.   Clotting  disorder (HCC)    Just recently.   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 Albertus:  Low-grade of narrowing Schatzki ring. Dilated to 18 mm with balloon. - 3 cm hiatal hernia. - Normal stomach. 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    Gallstone    Gastroparesis    GERD (gastroesophageal reflux disease)    Hiatal hernia    History of colon polyps    Hyperlipidemia    Hypertension    No sign of renal artery stenosis   IBS (irritable bowel syndrome)    Inguinal hernia, bilateral    Pre-diabetes    RBBB    Schatzki's ring    Vertigo    Vitamin D  deficiency     Past Surgical History:  Procedure Laterality Date   ABDOMINAL SURGERY  1956   Related to bleeding episode.    ARTHROSCOPIC REPAIR ACL Left    06/2023   BIOPSY  01/27/2020   Procedure: BIOPSY;  Surgeon: Wilhelmenia Aloha Raddle., MD;  Location: [facility] ENDOSCOPY;  Service: Gastroenterology;;   BIOPSY  01/21/2021   Procedure: BIOPSY;  Surgeon: Wilhelmenia Aloha Raddle., MD;  Location: [facility] ENDOSCOPY;  Service: Gastroenterology;;   CARDIAC CATHETERIZATION  2005   no intervention per patient   CARPAL TUNNEL RELEASE Left 03/06/2020   Dr. Camella   COCHLEAR IMPLANT Bilateral    COLONOSCOPY     several - Last one 11/22/19   CYST REMOVAL HAND Left 1975   [place]   ENDOSCOPIC MUCOSAL RESECTION N/A 01/27/2020   Procedure: ENDOSCOPIC MUCOSAL RESECTION;  Surgeon: Wilhelmenia Aloha Raddle., MD;  Location: [facility] ENDOSCOPY;  Service: Gastroenterology;  Laterality: N/A;   ENTEROSCOPY N/A 01/27/2020   Procedure: ENTEROSCOPY;  Surgeon: Wilhelmenia Aloha Raddle., MD;  Location: [facility] ENDOSCOPY;  Service: Gastroenterology;  Laterality: N/A;   ESOPHAGEAL MANOMETRY N/A 05/18/2022   Procedure: ESOPHAGEAL MANOMETRY (EM);   Surgeon: Albertus Gordy HERO, MD;  Location: [facility] ENDOSCOPY;  Service: Gastroenterology;  Laterality: N/A;   ESOPHAGOGASTRODUODENOSCOPY N/A 06/17/2022   Procedure: ESOPHAGOGASTRODUODENOSCOPY (EGD);  Surgeon: San Sandor GAILS, DO;  Location: [facility] ORS;  Service: Gastroenterology;  Laterality: N/A;   ESOPHAGOGASTRODUODENOSCOPY (EGD) WITH PROPOFOL  N/A 01/21/2021   Procedure: ESOPHAGOGASTRODUODENOSCOPY (EGD) WITH PROPOFOL ;  Surgeon: Wilhelmenia Aloha Raddle., MD;  Location: [facility] ENDOSCOPY;  Service: Gastroenterology;  Laterality: N/A;   HEMOSTASIS CLIP PLACEMENT  01/27/2020   Procedure: HEMOSTASIS CLIP PLACEMENT;  Surgeon: Wilhelmenia Aloha Raddle., MD;  Location: [facility] ENDOSCOPY;  Service: Gastroenterology;;   HERNIA REPAIR     12-23 with TIFF procedure also.   HIATAL HERNIA REPAIR N/A 06/17/2022   Procedure: LAPAROSCOPIC REPAIR OF HIATAL HERNIA WITH TIF;  Surgeon: Tanda Locus, MD;  Location: [facility] ORS;  Service: General;  Laterality: N/A;   SKIN LESION EXCISION     STAPEDECTOMY Bilateral    x 2 - 10 yrs apart - No MRIs per patient.   SUBMUCOSAL LIFTING INJECTION  01/27/2020   Procedure: SUBMUCOSAL LIFTING INJECTION;  Surgeon:  Mansouraty, Aloha Raddle., MD;  Location: [facility] ENDOSCOPY;  Service: Gastroenterology;;   SUBMUCOSAL TATTOO INJECTION  01/27/2020   Procedure: SUBMUCOSAL TATTOO INJECTION;  Surgeon: Wilhelmenia Aloha Raddle., MD;  Location: [facility] ENDOSCOPY;  Service: Gastroenterology;;   TRANSORAL INCISIONLESS FUNDOPLICATION N/A 06/17/2022   Procedure: TRANSORAL INCISIONLESS FUNDOPLICATION;  Surgeon: San Sandor GAILS, DO;  Location: [facility] ORS;  Service: Gastroenterology;  Laterality: N/A;   TRANSTHORACIC ECHOCARDIOGRAM  07/2017   Normal LV size and function.  EF 55-60%.  No RWMA.  Basal septal hypertrophy, but no sign of significant hypertensive heart disease.  Only GR 1 DD.   TRIGGER FINGER RELEASE Right 2014   Thumb   UPPER GI ENDOSCOPY     several - last one 11/22/2019   WISDOM TOOTH EXTRACTION      Current  Outpatient Medications  Medication Sig Dispense Refill   amLODipine  (NORVASC ) 10 MG tablet Take 10 mg by mouth daily.     aspirin  81 MG tablet Take 1 tablet (81 mg total) by mouth daily. 30 tablet    blood glucose meter kit and supplies KIT Dispense based on patient and insurance preference. Use up to four times daily as directed. 1 each 0   carvedilol  (COREG ) 12.5 MG tablet Take 1 tablet (12.5 mg total) by mouth 2 (two) times daily. 180 tablet 1   celecoxib  (CELEBREX ) 200 MG capsule Take 200 mg by mouth daily.     ezetimibe  (ZETIA ) 10 MG tablet Take 1 tablet (10 mg total) by mouth daily. 90 tablet 1   metFORMIN  (GLUCOPHAGE -XR) 500 MG 24 hr tablet Take 1,000 mg by mouth 2 (two) times daily.     olmesartan -hydrochlorothiazide  (BENICAR  HCT) 20-12.5 MG tablet Take 1 tablet by mouth daily. 90 tablet 1   rosuvastatin  (CRESTOR ) 40 MG tablet TAKE ONE TABLET BY MOUTH DAILY FOR CHOLESTEROL 90 tablet 3   No current facility-administered medications for this visit.    Allergies as of 02/08/2024 - Review Complete 02/08/2024  Allergen Reaction Noted   Other  06/14/2022    Family History  Problem Relation Age of Onset   Heart disease Mother        Atrial flutter   COPD Mother    Heart failure Mother    Arthritis Mother    Kidney disease Father    Arrhythmia Brother    Arthritis Brother    Heart disease Brother    Hyperlipidemia Brother    Hypertension Brother    Heart attack Brother    Hypertension Brother    Hyperlipidemia Brother    Arthritis Brother    Hypertension Brother    Hyperlipidemia Brother    Esophageal cancer Neg Hx    Colon cancer Neg Hx    Colon polyps Neg Hx    Stomach cancer Neg Hx    Ulcerative colitis Neg Hx     Review of Systems:    Constitutional: No weight loss, fever, chills, weakness or fatigue HEENT: Eyes: No change in vision               Ears, Nose, Throat:  No change in hearing or congestion Skin: No rash or itching Cardiovascular: No chest pain, chest  pressure or palpitations   Respiratory: No SOB or cough Gastrointestinal: See HPI and otherwise negative Genitourinary: No dysuria or change in urinary frequency Neurological: No headache, dizziness or syncope Musculoskeletal: No new muscle or joint pain Hematologic: No bleeding or bruising Psychiatric: No history of depression or anxiety    Physical Exam:  Vital signs:  BP 128/70   Pulse 70   Ht 6' 1 (1.854 m)   Wt 220 lb 9.6 oz (100.1 kg)   BMI 29.10 kg/m   Constitutional:   Pleasant male appears to be in NAD, Well developed, Well nourished, alert and cooperative Throat: Oral cavity and pharynx without inflammation, swelling or lesion.  Respiratory: Respirations even and unlabored. Lungs clear to auscultation bilaterally.   No wheezes, crackles, or rhonchi.  Cardiovascular: Normal S1, S2. Regular rate and rhythm. No peripheral edema, cyanosis or pallor.  Gastrointestinal:  Soft, nondistended, nontender. No rebound or guarding. Normal bowel sounds. No appreciable masses or hepatomegaly. Rectal:  Not performed.  Msk:  Symmetrical without gross deformities. Without edema, no deformity or joint abnormality.   RELEVANT LABS AND IMAGING: CBC    Latest Ref Rng & Units 10/18/2023    4:01 PM 08/15/2023    3:15 PM 07/18/2023    9:38 AM  CBC  WBC 3.4 - 10.8 x10E3/uL 9.3   4.8   Hemoglobin 13.0 - 17.7 g/dL 85.5   85.7   Hematocrit 37.5 - 51.0 % 44.3   43.3   Platelets 150 - 450 x10E3/uL 312  290  206      CMP     Latest Ref Rng & Units 01/29/2024    8:30 AM 07/18/2023    9:38 AM 01/03/2023    9:26 AM  CMP  Glucose 70 - 99 mg/dL 882  877  899   BUN 8 - 27 mg/dL 21  13  16    Creatinine 0.76 - 1.27 mg/dL 9.02  9.11  9.07   Sodium 134 - 144 mmol/L 137  142  139   Potassium 3.5 - 5.2 mmol/L 4.7  4.3  4.4   Chloride 96 - 106 mmol/L 100  103  100   CO2 20 - 29 mmol/L 22  32  31   Calcium  8.6 - 10.2 mg/dL 89.5  9.8  89.7   Total Protein 6.0 - 8.5 g/dL 6.5  6.5  6.7   Total Bilirubin  0.0 - 1.2 mg/dL 0.7  0.5  0.8   Alkaline Phos 44 - 121 IU/L 48     AST 0 - 40 IU/L 33  25  30   ALT 0 - 44 IU/L 55  32  31      Lab Results  Component Value Date   TSH 2.80 07/18/2023     Assessment: Encounter Diagnoses  Name Primary?   Hoarseness Yes   Change of voice    Globus sensation      73 year old with longstanding history of GERD requiring concomitant laparoscopic hiatal hernia pair and TIF on 06/17/2022. Presents with complaints of hoarseness and voice loss.  Important to note history of trauma to vocal cords 10 years ago as well as patient has job that requires projecting his voice which Eliya Geiman cause muscle tension or strain.  Patient also mentions he has issues with postnasal drip but not currently on any allergy medication. Patient does have pending ENT evaluation next week. Discussed case with Dr. San and he recommended we could try Pepcid  20 mg twice daily until patient seen by ENT.  If symptoms persisted and ENT workup was negative he would pursue a barium esophagram to evaluate for breakthrough reflux and then follow-up with Dr. San in the office.   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  Last surveillance EGD was 05/2022 and  no evidence of recurrence.   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   Thank you for the courtesy of this consult. Please call me with any questions or concerns.   Layni Kreamer, FNP-C [facility] 02/08/2024, 2:12 PM  Cc: Booker Darice SAUNDERS, FNP

## 2024-02-09 NOTE — Progress Notes (Signed)
 Agree with the assessment and plan as outlined by [facility], FNP-C.  Nathaniel Gill has done well with his prior cTIF with resolution of his index reflux sxs and had been able to titrate off all acid suppression therapy without breakthrough. 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 Agree with plan to trial Pepcid  for diagnostic and therapeutic intent while awaiting ENT appt next week. Will likely need laryngoscopy and will see what happens with that eval.  Sandor Flatter, DO, [facility]

## 2024-02-15 DIAGNOSIS — R490 Dysphonia: Secondary | ICD-10-CM | POA: Diagnosis not present

## 2024-03-01 DIAGNOSIS — M1712 Unilateral primary osteoarthritis, left knee: Secondary | ICD-10-CM | POA: Diagnosis not present

## 2024-03-05 ENCOUNTER — Other Ambulatory Visit: Payer: Self-pay | Admitting: Family Medicine

## 2024-03-05 DIAGNOSIS — I10 Essential (primary) hypertension: Secondary | ICD-10-CM

## 2024-03-05 MED ORDER — OLMESARTAN MEDOXOMIL-HCTZ 20-12.5 MG PO TABS: 1.0000 | ORAL_TABLET | Freq: Every day | ORAL | 1 refills | Status: AC

## 2024-03-08 MED ORDER — OMEPRAZOLE 40 MG PO CPDR: 40.0000 mg | DELAYED_RELEASE_CAPSULE | Freq: Every day | ORAL | 1 refills | Status: AC

## 2024-03-08 NOTE — Telephone Encounter (Signed)
 Script sent to pharmacy.

## 2024-03-12 ENCOUNTER — Other Ambulatory Visit: Payer: Self-pay | Admitting: Family

## 2024-03-18 DIAGNOSIS — M1712 Unilateral primary osteoarthritis, left knee: Secondary | ICD-10-CM | POA: Diagnosis not present

## 2024-03-20 ENCOUNTER — Encounter: Payer: Self-pay | Admitting: Cardiology

## 2024-03-26 ENCOUNTER — Ambulatory Visit: Attending: Cardiology | Admitting: Cardiology

## 2024-03-26 ENCOUNTER — Encounter: Payer: Self-pay | Admitting: Cardiology

## 2024-03-26 VITALS — BP 138/86 | HR 63 | Ht 73.0 in | Wt 225.0 lb

## 2024-03-26 DIAGNOSIS — E8881 Metabolic syndrome: Secondary | ICD-10-CM

## 2024-03-26 DIAGNOSIS — E785 Hyperlipidemia, unspecified: Secondary | ICD-10-CM | POA: Diagnosis not present

## 2024-03-26 DIAGNOSIS — I1A0 Resistant hypertension: Secondary | ICD-10-CM

## 2024-03-26 DIAGNOSIS — I2510 Atherosclerotic heart disease of native coronary artery without angina pectoris: Secondary | ICD-10-CM | POA: Diagnosis not present

## 2024-03-26 DIAGNOSIS — R5383 Other fatigue: Secondary | ICD-10-CM

## 2024-03-26 DIAGNOSIS — Z01810 Encounter for preprocedural cardiovascular examination: Secondary | ICD-10-CM | POA: Diagnosis not present

## 2024-03-26 DIAGNOSIS — E1169 Type 2 diabetes mellitus with other specified complication: Secondary | ICD-10-CM | POA: Diagnosis not present

## 2024-03-26 DIAGNOSIS — Z01818 Encounter for other preprocedural examination: Secondary | ICD-10-CM

## 2024-03-26 NOTE — Progress Notes (Incomplete)
 Cardiology Office Note:  .   Date:  03/26/2024  ID:  ELLIAS MCELREATH, DOB [birth year], MRN [id number] PCP: Booker Darice SAUNDERS, FNP  [facility] Providers Cardiologist:  Alm Clay, MD { Click to update primary MD,subspecialty MD or APP then REFRESH:1}    No chief complaint on file.   Patient Profile: SABRA     Luz Burcher is a very pleasant 73 y.o. male  with a PMH below who presents here for 30-month follow-up to discuss preop restratification for knee surgery at the request of Booker Darice SAUNDERS, FNP.  PMH :  Resistant HTN,  HLD,  DM-2 (metabolic syndrome)  Elevated Coronary Calcium  Score (CAC 1036, by Coronary CTA 837 with moderate-nonobstructive disease)  EKG notable for RBBB.  Other PMH includes cochlear implant, esophageal stricture, GERD, aortic dilation of roughly 40 mm in January 2023.     I last saw Tom on September 04, 2023 for routine follow-up.  (Prior to that had been quite sometime so saw him.  His blood pressures at home somewhat labile with most the time systolics in the 120s to 130s but on evaluation in clinic was 150/78.  He noted some hand pain/finger pain.  Also was concerned about weight gain.  He was feeling fatigued with lack of energy and feels like he may sleep all the time, but no active angina or heart failure symptoms.  Subjective  Discussed the use of AI scribe software for clinical note transcription with the patient, who gave verbal consent to proceed.  History of Present Illness Claudio Mondry is a 73 year old male with hypertension and coronary artery disease who presents for preoperative evaluation for left knee replacement.  He has hypertension with home blood pressure readings typically ranging from 120s to 140s systolic, occasionally as low as 113. His current medications include amlodipine  10 mg, carvedilol  12.5 mg twice daily, olmesartan  HCTZ 20/12.5 mg, and aspirin  81 mg.  He experiences persistent daytime sleepiness, stating 'I'm  sleepy all the time,' but denies feeling tired upon waking. He feels well-rested after sleep and does not experience symptoms of sleep apnea such as waking up trying to catch his breath. He remains active, working out at the gym three to four days a week and working at sports events, without experiencing chest pain, pressure, or palpitations during these activities.  He had a coronary CT performed over two years ago that showed mild to moderate plaque. No chest pain, pressure, shortness of breath, or heart failure symptoms. He is on rosuvastatin  40 mg and Zetia  for cholesterol management, with recent labs showing total cholesterol of 128, HDL 57, LDL 50, and triglycerides 877.  He has type 2 diabetes managed with metformin  1000 mg twice a day, with a recent hemoglobin A1c of 6.8, which was also 6.8 in July. 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  He is scheduled for left knee replacement surgery on October 31st. He reports a torn meniscus in both knees, with the left knee having been previously repaired six months ago. He reports that the knee 'blew out' after running in the rain.  Cardiovascular ROS: no chest pain or dyspnea on exertion positive for - mostly notes sleepiness and fatigue, but not actually daytime sleepiness consistent with OSA. negative for - edema, irregular heartbeat, murmur, orthopnea, palpitations, paroxysmal nocturnal dyspnea, rapid heart rate, shortness of breath, or ***  ROS:  Review of Systems -  Negative except symptoms noted in HPI    Objective   Current Meds  Medication Sig  . amLODipine  (NORVASC ) 10 MG tablet Take 10 mg by mouth daily.  . aspirin  81 MG tablet Take 1 tablet (81 mg total) by mouth daily.  . blood glucose meter kit and supplies KIT Dispense based on patient and insurance preference. Use up to four times daily as directed.  . carvedilol  (COREG ) 12.5 MG tablet Take 1 tablet (12.5 mg  total) by mouth 2 (two) times daily.  . celecoxib  (CELEBREX ) 200 MG capsule Take 200 mg by mouth daily.  . ezetimibe  (ZETIA ) 10 MG tablet Take 1 tablet (10 mg total) by mouth daily.  . metFORMIN  (GLUCOPHAGE -XR) 500 MG 24 hr tablet Take 1,000 mg by mouth 2 (two) times daily.  . olmesartan -hydrochlorothiazide  (BENICAR  HCT) 20-12.5 MG tablet Take 1 tablet by mouth daily.  . omeprazole  (PRILOSEC) 40 MG capsule Take 1 capsule (40 mg total) by mouth daily.  . rosuvastatin  (CRESTOR ) 40 MG tablet TAKE ONE TABLET BY MOUTH DAILY FOR CHOLESTEROL    Studies Reviewed: SABRA   EKG Interpretation Date/Time:  Tuesday March 26 2024 11:06:17 EDT Ventricular Rate:  63 PR Interval:  188 QRS Duration:  146 QT Interval:  430 QTC Calculation: 440 R Axis:   30  Text Interpretation: Normal sinus rhythm Right bundle branch block When compared with ECG of 17-Apr-2022 08:25, Premature atrial complexes NO LONGER PRESENT Confirmed by Anner Lenis ([id number]) on 03/26/2024 11:58:33 AM    Ref Range & Units (hover) 1 mo ago (01/29/24) 8 mo ago (07/18/23) 1 yr ago (01/03/23)  Cholesterol, Total 128    Triglycerides 122 128 R 120 R  HDL 57 45 R 42 R  VLDL Cholesterol Cal 21    LDL Chol Calc (NIH) 50    Chol/HDL Ratio 2.2 2.8 R 2.5 R   Ref Range & Units (hover) 1 mo ago (01/29/24) 8 mo ago (07/18/23) 1 yr ago (01/03/23)  Glucose 117 High  122 High  R, CM 100 High  R, CM  BUN 21 13 R 16 R  Creatinine, Ser 0.97 0.88 R 0.92 R  eGFR 83 93 R 89 R  BUN/Creatinine Ratio 22 SEE NOTE: R, CM SEE NOTE: R, CM  Sodium 137 142 R 139 R  Potassium 4.7 4.3 R 4.4 R  Chloride 100 103 R 100 R  CO2 22 32 R 31 R  Calcium  10.4 High  9.8 R 10.2 R  Total Protein 6.5 6.5 R 6.7 R  Albumin 4.3    Globulin, Total 2.2    Bilirubin Total 0.7 0.5 R 0.8 R  Alkaline Phosphatase 48    AST 33 25 R 30 R  ALT 55 High  32 R 31 R   Ref Range & Units (hover) 2 mo ago (01/24/24) 8 mo ago (07/18/23) 1 yr ago (01/03/23)  Hemoglobin A1C 6.8 Abnormal   6.7 High  R, CM 6.2 High     Results  RADIOLOGY Coronary CT: Mild to moderate plaque in several locations; CT FFR indicated modest disease not warranting intervention (2023) Cor Ca++ Score 837. Mixed mod plaque prox RCA(50 to 69%). Mild Ca++ plaque mid RCA (25-49%). Minimal Ca++ plaque prox LCx (0-24%). Mid LCx CA++ plaque (50 to 69%), Prox OM1 CA++ plaque (50-69%). Prox LAD mild to 0-24%. LM mild 0-24% Ca++ plaque. => CT FFR + for distal LCx (small caliber) Negative findings in the LAD, OM1 and and RCA. => Would not recommend catheterization.  Risk Assessment/Calculations:           {Click Here to Calculate RCRI      :[id number]}  { Click Here to Calculate DASI      :[id number]} Mr. Teuscher perioperative risk of a major cardiac event is 0.4% according to the Revised Cardiac Risk Index (RCRI).  Therefore, he is at low risk for perioperative complications.   His functional capacity is excellent at 8.97 METs according to the Duke Activity Status Index (DASI). Recommendations: According to ACC/AHA guidelines, no further cardiovascular testing needed.  The patient may proceed to surgery at acceptable risk.   Antiplatelet and/or Anticoagulation Recommendations: Aspirin  can be held for 5 days prior to his surgery.  Please resume Aspirin  post operatively when it is felt to be safe from a bleeding standpoint.  No other meds to hold    Physical Exam:   VS:  BP 138/86   Pulse 63   Ht 6' 1 (1.854 m)   Wt 225 lb (102.1 kg)   SpO2 96%   BMI 29.69 kg/m    Wt Readings from Last 3 Encounters:  03/26/24 225 lb (102.1 kg)  02/08/24 220 lb 9.6 oz (100.1 kg)  01/24/24 227 lb (103 kg)      GEN: Well nourished, well groomed in no acute distress; borderline obese NECK: No JVD; No carotid bruits CARDIAC: Normal S1, S2; RRR, no murmurs, rubs, gallops RESPIRATORY:  Clear to auscultation without rales, wheezing or rhonchi ; nonlabored, good air movement. ABDOMEN: Soft, non-tender,  non-distended EXTREMITIES:  No edema; No deformity      ASSESSMENT AND PLAN: .    Problem List Items Addressed This Visit       Cardiology Problems   Coronary artery disease, non-occlusive (Chronic)   Hyperlipidemia associated with type 2 diabetes mellitus (HCC) (Chronic)   Resistant hypertension (Chronic)     Other   Metabolic syndrome (Chronic)   Preoperative cardiovascular examination   Other Visit Diagnoses       Pre-op evaluation    -  Primary   Relevant Orders   EKG 12-Lead (Completed)       Assessment and Plan Assessment & Plan Atherosclerotic coronary artery disease without angina Coronary CT from 2023 showed mild to moderate plaque. CT FFR indicated modest disease not warranting intervention. Asymptomatic with no angina or other cardiac symptoms. Lipids well controlled. - Continue rosuvastatin  40 mg daily. - Continue ezetimibe .  Preoperative cardiovascular risk assessment for left knee replacement  Essential hypertension Blood pressure well controlled with carvedilol , amlodipine , and olmesartan  HCTZ. Recent home readings range from 120s to 140s systolic. In-office blood pressure was 138/86. - Continue carvedilol  12.5 mg twice daily. - Continue amlodipine  10 mg daily. - Continue olmesartan  HCTZ 20/12.5 mg daily.  Type 2 diabetes mellitus without complications Type 2 diabetes managed with metformin , recent hemoglobin A1c at 6.8%. Not on insulin . Consideration for adding an SGLT2 inhibitor for cardiovascular protection post-surgery. - Continue metformin  1000 mg twice daily. - Consider SGLT2 inhibitor post-surgery for cardiovascular protection.  Hyperlipidemia  Recording duration: 18 minutes       {Are you ordering a CV Procedure (e.g. stress test, cath, DCCV, TEE, etc)?   Press F2        :[id number]}   Follow-Up: Return in about 1 year (around 03/26/2025).  I spent *** minutes in the care of DAVARIUS RIDENER today including {CHL AMB CAR Time Based Billing  Options STW (Optional):[phone]::documenting in the encounter.}      Signed, Alm MICAEL Clay,  MD, MS Alm Clay, M.D., M.S. Interventional Cardiologist  [facility] Pager # [phone]

## 2024-03-26 NOTE — Assessment & Plan Note (Addendum)
 Scheduled for left knee replacement on May 03, 2024-Dr. Elsie Schimke. Considered low-risk for surgery. No myocardial infarction, positive stress test, heart failure, or stroke. Diabetes managed with metformin , and kidney function is normal. Asymptomatic with respect to cardiac issues. Coronary CT from 2023 showed mild to moderate plaq  Recent ischemic evaluation only notable for small sidebranch stenosis that is asymptomatic.  No further testing required.   Mr. Battiste perioperative risk of a major cardiac event is 0.4% according to the Revised Cardiac Risk Index (RCRI).  Therefore, he is at low risk for perioperative complications.   His functional capacity is excellent at 8.97 METs according to the Duke Activity Status Index (DASI). Recommendations: According to ACC/AHA guidelines, no further cardiovascular testing needed.  The patient may proceed to surgery at acceptable risk.   Antiplatelet and/or Anticoagulation Recommendations: Aspirin  can be held for 5 days prior to his surgery.  Please resume Aspirin  post operatively when it is felt to be safe from a bleeding standpoint.  No other meds to hold  Preoperative instructions: - Hold aspirin  81 mg five days prior to surgery. - Continue carvedilol  and amlodipine  on the day of surgery. - Take olmesartan  in the evening prior to surgery.

## 2024-03-26 NOTE — Progress Notes (Signed)
 Cardiology Office Note:  .   Date:  03/27/2024  ID:  Nathaniel Gill, DOB 21-Jul-1950, MRN [id number] PCP: Booker Darice SAUNDERS, FNP  [facility] Providers Cardiologist:  Alm Clay, MD     Chief Complaint  Patient presents with   Follow-up    66-month follow-up.  Discussed preop clearance for knee surgery   Coronary Artery Disease    Moderate diffuse disease noted on Coronary CTA but the only vessel had significant stenosis was a very small caliber OM branch with plans to treat medically.    Patient Profile: Nathaniel Gill     Nathaniel Gill is a very pleasant 73 y.o. male  with a PMH below who presents here for 23-month follow-up to discuss preop restratification for knee surgery at the request of Booker Darice SAUNDERS, FNP.  PMH :  Resistant HTN,  HLD,  DM-2 (metabolic syndrome)  Elevated Coronary Calcium  Score (CAC 1036, by Coronary CTA 837 with moderate-nonobstructive disease)  EKG notable for RBBB.  Other PMH includes cochlear implant, esophageal stricture, GERD, aortic dilation of roughly 40 mm in January 2023.     I last saw Tom on September 04, 2023 for routine follow-up.  (Prior to that had been quite sometime so saw him.  His blood pressures at home somewhat labile with most the time systolics in the 120s to 130s but on evaluation in clinic was 150/78.  He noted some hand pain/finger pain.  Also was concerned about weight gain.  He was feeling fatigued with lack of energy and feels like he may sleep all the time, but no active angina or heart failure symptoms.  Subjective  Discussed the use of AI scribe software for clinical note transcription with the patient, who gave verbal consent to proceed.  History of Present Illness [name] is a 73 year old male with hypertension and coronary artery disease who presents for preoperative evaluation for left knee replacement.  He has hypertension with home blood pressure readings typically ranging from 120s to 140s systolic,  occasionally as low as 113. His current medications include amlodipine  10 mg, carvedilol  12.5 mg twice daily, olmesartan  HCTZ 20/12.5 mg, and aspirin  81 mg.  He experiences persistent daytime sleepiness, stating 'I'm sleepy all the time,' but denies feeling tired upon waking. He feels well-rested after sleep and does not experience symptoms of sleep apnea such as waking up trying to catch his breath. He remains active, working out at the gym three to four days a week and working at sports events, without experiencing chest pain, pressure, or palpitations during these activities.  He had a coronary CT performed over two years ago that showed mild to moderate plaque. No chest pain, pressure, shortness of breath, or heart failure symptoms. He is on rosuvastatin  40 mg and Zetia  for cholesterol management, with recent labs showing total cholesterol of 128, HDL 57, LDL 50, and triglycerides 877.  He has type 2 diabetes managed with metformin  1000 mg twice a day, with a recent hemoglobin A1c of 6.8, which was also 6.8 in July. 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  He is scheduled for left knee replacement surgery on October 31st. He reports a torn meniscus in both knees, with the left knee having been previously repaired six months ago. He reports that the knee 'blew out' after running in the rain.  Cardiovascular ROS: no chest pain or dyspnea on exertion positive  for - mostly notes sleepiness and fatigue, but not actually daytime sleepiness consistent with OSA. negative for - edema, irregular heartbeat, murmur, orthopnea, palpitations, paroxysmal nocturnal dyspnea, rapid heart rate, shortness of breath, or syncope/near-syncope or TIA/amaurosis fugax, claudication  ROS:  Review of Systems - Negative except symptoms noted in HPI    Objective   Current Meds  Medication Sig   amLODipine  (NORVASC ) 10 MG tablet Take 10 mg by mouth  daily.   aspirin  81 MG tablet Take 1 tablet (81 mg total) by mouth daily.   carvedilol  (COREG ) 12.5 MG tablet Take 1 tablet (12.5 mg total) by mouth 2 (two) times daily.   celecoxib  (CELEBREX ) 200 MG capsule Take 200 mg by mouth daily.   ezetimibe  (ZETIA ) 10 MG tablet Take 1 tablet (10 mg total) by mouth daily.   metFORMIN  (GLUCOPHAGE -XR) 500 MG 24 hr tablet Take 1,000 mg by mouth 2 (two) times daily.   olmesartan -hydrochlorothiazide  (BENICAR  HCT) 20-12.5 MG tablet Take 1 tablet by mouth daily.   omeprazole  (PRILOSEC) 40 MG capsule Take 1 capsule (40 mg total) by mouth daily.   rosuvastatin  (CRESTOR ) 40 MG tablet TAKE ONE TABLET BY MOUTH DAILY FOR CHOLESTEROL    Studies Reviewed: Nathaniel Gill   EKG Interpretation Date/Time:  Tuesday March 26 2024 11:06:17 EDT Ventricular Rate:  63 PR Interval:  188 QRS Duration:  146 QT Interval:  430 QTC Calculation: 440 R Axis:   30  Text Interpretation: Normal sinus rhythm Right bundle branch block When compared with ECG of 17-Apr-2022 08:25, Premature atrial complexes NO LONGER PRESENT Confirmed by Anner Lenis ([id number]) on 03/26/2024 11:58:33 AM    Ref Range & Units (hover) 1 mo ago (01/29/24) 8 mo ago (07/18/23) 1 yr ago (01/03/23)  Cholesterol, Total 128    Triglycerides 122 128 R 120 R  HDL 57 45 R 42 R  VLDL Cholesterol Cal 21    LDL Chol Calc (NIH) 50    Chol/HDL Ratio 2.2 2.8 R 2.5 R   Ref Range & Units (hover) 1 mo ago (01/29/24) 8 mo ago (07/18/23) 1 yr ago (01/03/23)  Glucose 117 High  122 High  R, CM 100 High  R, CM  BUN 21 13 R 16 R  Creatinine, Ser 0.97 0.88 R 0.92 R  eGFR 83 93 R 89 R  BUN/Creatinine Ratio 22 SEE NOTE: R, CM SEE NOTE: R, CM  Sodium 137 142 R 139 R  Potassium 4.7 4.3 R 4.4 R  Chloride 100 103 R 100 R  CO2 22 32 R 31 R  Calcium  10.4 High  9.8 R 10.2 R  Total Protein 6.5 6.5 R 6.7 R  Albumin 4.3    Globulin, Total 2.2    Bilirubin Total 0.7 0.5 R 0.8 R  Alkaline Phosphatase 48    AST 33 25 R 30 R  ALT 55 High  32  R 31 R   Ref Range & Units (hover) 2 mo ago (01/24/24) 8 mo ago (07/18/23) 1 yr ago (01/03/23)  Hemoglobin A1C 6.8 Abnormal  6.7 High  R, CM 6.2 High     Results  RADIOLOGY Coronary CT: Mild to moderate plaque in several locations; CT FFR indicated modest disease not warranting intervention (2023) Cor Ca++ Score 837. Mixed mod plaque prox RCA(50 to 69%). Mild Ca++ plaque mid RCA (25-49%). Minimal Ca++ plaque prox LCx (0-24%). Mid LCx CA++ plaque (50 to 69%), Prox OM1 CA++ plaque (50-69%). Prox LAD mild to 0-24%. LM mild 0-24% Ca++ plaque. => CT FFR +  for distal LCx (small caliber) Negative findings in the LAD, OM1 and and RCA. => Would not recommend catheterization.       Risk Assessment/Calculations:              Mr. Eid perioperative risk of a major cardiac event is 0.4% according to the Revised Cardiac Risk Index (RCRI).  Therefore, he is at low risk for perioperative complications.   His functional capacity is excellent at 8.97 METs according to the Duke Activity Status Index (DASI). Recommendations: According to ACC/AHA guidelines, no further cardiovascular testing needed.  The patient may proceed to surgery at acceptable risk.   Antiplatelet and/or Anticoagulation Recommendations: Aspirin  can be held for 5 days prior to his surgery.  Please resume Aspirin  post operatively when it is felt to be safe from a bleeding standpoint.  No other meds to hold    Physical Exam:   VS:  BP 138/86   Pulse 63   Ht 6' 1 (1.854 m)   Wt 225 lb (102.1 kg)   SpO2 96%   BMI 29.69 kg/m    Wt Readings from Last 3 Encounters:  03/26/24 225 lb (102.1 kg)  02/08/24 220 lb 9.6 oz (100.1 kg)  01/24/24 227 lb (103 kg)      GEN: Well nourished, well groomed in no acute distress; borderline obese NECK: No JVD; No carotid bruits CARDIAC: Normal S1, S2; RRR, no murmurs, rubs, gallops RESPIRATORY:  Clear to auscultation without rales, wheezing or rhonchi ; nonlabored, good air movement. ABDOMEN:  Soft, non-tender, non-distended EXTREMITIES:  No edema; No deformity      ASSESSMENT AND PLAN: .    Problem List Items Addressed This Visit       Cardiology Problems   Coronary artery disease, non-occlusive (Chronic)   Coronary CT from 2023 showed mild to moderate plaque.  CT FFR indicated significance in a small caliber OM1 branch-small vessel disease not warranting intervention especially if he were to remainasymptomatic. He is currently asymptomatic with no angina or other cardiac symptoms. Lipids well controlled.  Unfortunately diabetes and blood pressure are borderline. - Continue rosuvastatin  40 mg daily along with ezetimibe  10 mg for lipid management. - Continue current dose of carvedilol  12.5 mg twice daily, amlodipine  10 mg daily for continued BP and antianginal benefit - For now continue olmesartan -HCTZ 20-12.5 mg daily with low threshold to titrate to 40-25 mg. - Continue aspirin  81 mg daily for prophylaxis  Okay to hold aspirin  5 to 7 days preop for surgeries or procedures. - Consider SGLT2 inhibitor addition for diabetes in addition to metformin  for better glycemic control..      Hyperlipidemia associated with type 2 diabetes mellitus (HCC) (Chronic)   Hyperlipidemia well managed with current lipid-lowering therapy. Total cholesterol is 128, HDL is 57, LDL is 50, and triglycerides are 122. - Continue rosuvastatin  40 mg daily. - Continue ezetimibe  10 mg daily.  Type 2 diabetes managed with metformin , recent hemoglobin A1c at 6.8%. Not on insulin . Consideration for adding an SGLT2 inhibitor for cardiovascular protection post-surgery. - Continue metformin  1000 mg twice daily. - Consider SGLT2 inhibitor post-surgery for cardiovascular protection.      Resistant hypertension (Chronic)   On combination of beta-blocker, calcium  channel blocker, ARB-HCTZ criteria for resistant hypertension.  Blood pressure well controlled with carvedilol , amlodipine , and olmesartan  HCTZ. Recent  home readings range from 120s to 140s systolic. In-office blood pressure was 138/86. - Continue carvedilol  12.5 mg twice daily. - Continue amlodipine  10 mg daily. - Continue olmesartan  HCTZ  20/12.5 mg daily. => Low threshold to titrate to 40-12.5 or 40-25 mg dosing.        Other   Fatigue due to treatment   He is really describe more symptoms of sleepiness than fatigue.  Not sure if this has any new medications.  He does not sound that he has OSA symptoms.  May need sleep medicine evaluation.      Metabolic syndrome (Chronic)   Overweight but now not obese , but HTN, DM-2 meets criteria. A1c slightly elevated at 6.8 on metformin -consider more aggressive management; BP elevated today, but usually better controlled-low threshold to titrate to 40-25 or 40-12.5 mg olmesartan -HCTZ. Lipids well-controlled      Preoperative cardiovascular examination - Primary   Scheduled for left knee replacement on May 03, 2024-Dr. Elsie Schimke. Considered low-risk for surgery. No myocardial infarction, positive stress test, heart failure, or stroke. Diabetes managed with metformin , and kidney function is normal. Asymptomatic with respect to cardiac issues. Coronary CT from 2023 showed mild to moderate plaq  Recent ischemic evaluation only notable for small sidebranch stenosis that is asymptomatic.  No further testing required.   Mr. Fulco perioperative risk of a major cardiac event is 0.4% according to the Revised Cardiac Risk Index (RCRI).  Therefore, he is at low risk for perioperative complications.   His functional capacity is excellent at 8.97 METs according to the Duke Activity Status Index (DASI). Recommendations: According to ACC/AHA guidelines, no further cardiovascular testing needed.  The patient may proceed to surgery at acceptable risk.   Antiplatelet and/or Anticoagulation Recommendations: Aspirin  can be held for 5 days prior to his surgery.  Please resume Aspirin  post operatively  when it is felt to be safe from a bleeding standpoint.  No other meds to hold  Preoperative instructions: - Hold aspirin  81 mg five days prior to surgery. - Continue carvedilol  and amlodipine  on the day of surgery. - Take olmesartan  in the evening prior to surgery.       Relevant Orders   EKG 12-Lead (Completed)           Follow-Up: Return in about 1 year (around 03/26/2025) for Routine follow up with me.      Signed, Alm MICAEL Clay, MD, MS Alm Clay, M.D., M.S. Interventional Cardiologist  [facility] Pager # [phone]

## 2024-03-26 NOTE — Patient Instructions (Addendum)
 Medication Instructions:  No changes- Hold Aspirin  5 days pre-operatively. *If you need a refill on your cardiac medications before your next appointment, please call your pharmacy*  Lab Work: None ordered If you have labs (blood work) drawn today and your tests are completely normal, you will receive your results only by: MyChart Message (if you have MyChart) OR A paper copy in the mail If you have any lab test that is abnormal or we need to change your treatment, we will call you to review the results.  Testing/Procedures: None ordered  Follow-Up: At [facility], you and your health needs are our priority.  As part of our continuing mission to provide you with exceptional heart care, our providers are all part of one team.  This team includes your primary Cardiologist (physician) and Advanced Practice Providers or APPs (Physician Assistants and Nurse Practitioners) who all work together to provide you with the care you need, when you need it.  Your next appointment:   1 year(s)  Provider:   Alm Clay, MD    We recommend signing up for the patient portal called MyChart.  Sign up information is provided on this After Visit Summary.  MyChart is used to connect with patients for Virtual Visits (Telemedicine).  Patients are able to view lab/test results, encounter notes, upcoming appointments, etc.  Non-urgent messages can be sent to your provider as well.   To learn more about what you can do with MyChart, go to [email or web address].

## 2024-03-27 NOTE — Assessment & Plan Note (Signed)
 On combination of beta-blocker, calcium  channel blocker, ARB-HCTZ criteria for resistant hypertension.  Blood pressure well controlled with carvedilol , amlodipine , and olmesartan  HCTZ. Recent home readings range from 120s to 140s systolic. In-office blood pressure was 138/86. - Continue carvedilol  12.5 mg twice daily. - Continue amlodipine  10 mg daily. - Continue olmesartan  HCTZ 20/12.5 mg daily. => Low threshold to titrate to 40-12.5 or 40-25 mg dosing.

## 2024-03-27 NOTE — Assessment & Plan Note (Addendum)
 Hyperlipidemia well managed with current lipid-lowering therapy. Total cholesterol is 128, HDL is 57, LDL is 50, and triglycerides are 122. - Continue rosuvastatin  40 mg daily. - Continue ezetimibe  10 mg daily.  Type 2 diabetes managed with metformin , recent hemoglobin A1c at 6.8%. Not on insulin . Consideration for adding an SGLT2 inhibitor for cardiovascular protection post-surgery. - Continue metformin  1000 mg twice daily. - Consider SGLT2 inhibitor post-surgery for cardiovascular protection.

## 2024-03-27 NOTE — Assessment & Plan Note (Signed)
 Coronary CT from 2023 showed mild to moderate plaque.  CT FFR indicated significance in a small caliber OM1 branch-small vessel disease not warranting intervention especially if he were to remainasymptomatic. He is currently asymptomatic with no angina or other cardiac symptoms. Lipids well controlled.  Unfortunately diabetes and blood pressure are borderline. - Continue rosuvastatin  40 mg daily along with ezetimibe  10 mg for lipid management. - Continue current dose of carvedilol  12.5 mg twice daily, amlodipine  10 mg daily for continued BP and antianginal benefit - For now continue olmesartan -HCTZ 20-12.5 mg daily with low threshold to titrate to 40-25 mg. - Continue aspirin  81 mg daily for prophylaxis  Okay to hold aspirin  5 to 7 days preop for surgeries or procedures. - Consider SGLT2 inhibitor addition for diabetes in addition to metformin  for better glycemic control.SABRA

## 2024-03-27 NOTE — Assessment & Plan Note (Signed)
 Overweight but now not obese , but HTN, DM-2 meets criteria. A1c slightly elevated at 6.8 on metformin -consider more aggressive management; BP elevated today, but usually better controlled-low threshold to titrate to 40-25 or 40-12.5 mg olmesartan -HCTZ. Lipids well-controlled

## 2024-03-27 NOTE — Assessment & Plan Note (Signed)
 He is really describe more symptoms of sleepiness than fatigue.  Not sure if this has any new medications.  He does not sound that he has OSA symptoms.  May need sleep medicine evaluation.

## 2024-03-28 ENCOUNTER — Ambulatory Visit: Attending: Internal Medicine | Admitting: Internal Medicine

## 2024-03-28 ENCOUNTER — Other Ambulatory Visit: Payer: Self-pay | Admitting: Cardiology

## 2024-03-28 ENCOUNTER — Encounter: Payer: Self-pay | Admitting: Internal Medicine

## 2024-03-28 VITALS — BP 154/83 | HR 63 | Temp 97.4°F | Resp 16 | Ht 73.25 in | Wt 224.2 lb

## 2024-03-28 DIAGNOSIS — M65341 Trigger finger, right ring finger: Secondary | ICD-10-CM

## 2024-03-28 DIAGNOSIS — M25462 Effusion, left knee: Secondary | ICD-10-CM | POA: Diagnosis not present

## 2024-03-28 DIAGNOSIS — M79641 Pain in right hand: Secondary | ICD-10-CM | POA: Diagnosis not present

## 2024-03-28 DIAGNOSIS — R768 Other specified abnormal immunological findings in serum: Secondary | ICD-10-CM

## 2024-03-28 NOTE — Progress Notes (Signed)
 Office Visit Note  Patient: Nathaniel Gill             Date of Birth: [birth year]           MRN: [id number]             PCP: Booker Darice SAUNDERS, FNP Referring: Booker Darice SAUNDERS, FNP Visit Date: 03/28/2024 Occupation: Data Unavailable  Subjective:  New Patient (Initial Visit) (Knee replacement on 05/03/2024, PCP wanted him to get a check up on the arthritis in his hands )    Discussed the use of AI scribe software for clinical note transcription with the patient, who gave verbal consent to proceed.  History of Present Illness   Nathaniel Gill is a 73 year old male who presents with a positive ANA test. He was referred by Dr. Anner for evaluation of recurrent joint pain and swelling.  He experiences recurrent joint pain and swelling, particularly in the knees, with a history of a positive ANA test noted in April. The ANA titer was 1:160. Further testing for specific autoimmune markers was negative, and inflammatory markers such as sedimentation rate and C-reactive protein were normal.  He has a history of knee issues, including a meniscus repair in December 2024, followed by monthly knee effusions requiring drainage of large volumes up to >100 cc of fluid. Recently, he experienced another meniscus tear and MCL injury while running. He is scheduled for knee replacement surgery on October 31.  He also reports trigger finger in the mornings, which resolves during the day, and large knuckles. His ankles, especially the right one, have been damaged from sports injuries in his twenties. He has a history of osteoarthritis, which runs in his family, as evidenced by his brother and late mother's similar conditions.  No significant pain in his hands despite the presence of large knuckles and trigger finger. He also denies any significant weakness or muscle stiffness, although he works with his hands frequently.      Activities of Daily Living:  Patient reports morning stiffness for  none.    Patient Denies nocturnal pain.  Difficulty dressing/grooming: Denies Difficulty climbing stairs: Denies Difficulty getting out of chair: Denies Difficulty using hands for taps, buttons, cutlery, and/or writing: Denies  Review of Systems  Constitutional:  Positive for fatigue.  HENT:  Negative for mouth sores and mouth dryness.   Eyes:  Negative for dryness.  Respiratory:  Negative for shortness of breath.   Cardiovascular:  Negative for chest pain and palpitations.  Gastrointestinal:  Negative for blood in stool, constipation and diarrhea.  Endocrine: Negative for increased urination.  Genitourinary:  Negative for involuntary urination.  Musculoskeletal:  Positive for joint pain, joint pain and joint swelling. Negative for gait problem, myalgias, muscle weakness, morning stiffness, muscle tenderness and myalgias.  Skin:  Negative for color change, rash, hair loss and sensitivity to sunlight.  Allergic/Immunologic: Negative for susceptible to infections.  Neurological:  Negative for dizziness and headaches.  Hematological:  Negative for swollen glands.  Psychiatric/Behavioral:  Negative for depressed mood and sleep disturbance. The patient is not nervous/anxious.     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   Prediabetes 08/15/2023   Scrotal bleeding 08/15/2023   History of repair of hiatal hernia 06/18/2022   History of fundoplication 06/18/2022   Peptic stricture of esophagus 06/17/2022   Hypercalcemia  06/17/2022   Esophageal dysphagia 05/20/2022   Metabolic syndrome 06/18/2021   Coronary artery disease, non-occlusive 06/07/2021   S/P dilatation of esophageal stricture 11/23/2020   CKD stage 2 due to type 2 diabetes mellitus (HCC) 02/13/2019   Atherosclerosis of aorta (HCC) by CXR 2020 07/26/2018   Overweight (BMI 25.0-29.9) 09/05/2017   Erectile dysfunction associated with type 2 diabetes mellitus (HCC) 05/23/2017   Type 2 diabetes mellitus without complication, without long-term current use of insulin  (HCC) 05/22/2017   Vitamin D  deficiency 05/22/2017   Establishing care with new doctor, encounter for 05/22/2017   Degenerative lumbar disc 04/13/2017   RBBB (right bundle branch block) 04/13/2017   Resistant hypertension 02/20/2013   Hyperlipidemia associated with type 2 diabetes mellitus (HCC) 02/20/2013    Past Medical History:  Diagnosis Date   Aortic atherosclerosis    Arthritis    CAD (coronary artery disease) 07/13/2021   Cor Ca++ Score 837.  Mixed mod plaque prox RCA(50 to 69%).  Mild Ca++ plaque mid RCA (25-49%).  Minimal Ca++ plaque prox LCx (0-24%).  Mid LCx CA++ plaque (50 to 69%), Prox OM1 CA++ plaque (50-69%).  Prox LAD mild to 0-24%. LM  mild 0-24% Ca++  plaque. => CT FFR + for distal LCx (small caliber)  Negative findings in the LAD, OM1 and  and RCA. =>  Would not recommend catheterization.   Clotting disorder    Just recently.   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 Albertus:  Low-grade of narrowing Schatzki ring. Dilated to 18 mm with balloon. - 3 cm hiatal hernia. - Normal stomach. 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    Gallstone    Gastroparesis    GERD (gastroesophageal reflux disease)    Hiatal hernia    History of  colon polyps    Hyperlipidemia    Hypertension    No sign of renal artery stenosis   IBS (irritable bowel syndrome)    Inguinal hernia, bilateral    Pre-diabetes    RBBB    Schatzki's ring    Vertigo    Vitamin D  deficiency     Family History  Problem Relation Age of Onset   Heart disease Mother        Atrial flutter   COPD Mother    Heart failure Mother    Arthritis Mother    Kidney disease Father    Arrhythmia Brother    Arthritis Brother    Heart disease Brother    Hyperlipidemia Brother    Hypertension Brother    Heart attack Brother    Hypertension Brother    Hyperlipidemia Brother    Arthritis Brother    Hypertension Brother    Hyperlipidemia Brother    Healthy Son    Esophageal cancer Neg Hx    Colon cancer Neg Hx    Colon polyps Neg Hx    Stomach cancer Neg Hx    Ulcerative  colitis Neg Hx    Past Surgical History:  Procedure Laterality Date   ABDOMINAL SURGERY  1956   Related to bleeding episode.    ARTHROSCOPIC REPAIR ACL Left    06/2023   BIOPSY  01/27/2020   Procedure: BIOPSY;  Surgeon: Wilhelmenia Aloha Raddle., MD;  Location: [facility] ENDOSCOPY;  Service: Gastroenterology;;   BIOPSY  01/21/2021   Procedure: BIOPSY;  Surgeon: Wilhelmenia Aloha Raddle., MD;  Location: [facility] ENDOSCOPY;  Service: Gastroenterology;;   CARDIAC CATHETERIZATION  2005   no intervention per patient   CARPAL TUNNEL RELEASE Left 03/06/2020   Dr. Camella   COCHLEAR IMPLANT Bilateral    COLONOSCOPY     several - Last one 11/22/19   CYST REMOVAL HAND Left 1975   [place]   ENDOSCOPIC MUCOSAL RESECTION N/A 01/27/2020   Procedure: ENDOSCOPIC MUCOSAL RESECTION;  Surgeon: Wilhelmenia Aloha Raddle., MD;  Location: [facility] ENDOSCOPY;  Service: Gastroenterology;  Laterality: N/A;   ENTEROSCOPY N/A 01/27/2020   Procedure: ENTEROSCOPY;  Surgeon: Wilhelmenia Aloha Raddle., MD;  Location: [facility] ENDOSCOPY;  Service: Gastroenterology;  Laterality: N/A;   ESOPHAGEAL MANOMETRY N/A 05/18/2022   Procedure: ESOPHAGEAL  MANOMETRY (EM);  Surgeon: Albertus Gordy HERO, MD;  Location: [facility] ENDOSCOPY;  Service: Gastroenterology;  Laterality: N/A;   ESOPHAGOGASTRODUODENOSCOPY N/A 06/17/2022   Procedure: ESOPHAGOGASTRODUODENOSCOPY (EGD);  Surgeon: San Sandor GAILS, DO;  Location: [facility] ORS;  Service: Gastroenterology;  Laterality: N/A;   ESOPHAGOGASTRODUODENOSCOPY (EGD) WITH PROPOFOL  N/A 01/21/2021   Procedure: ESOPHAGOGASTRODUODENOSCOPY (EGD) WITH PROPOFOL ;  Surgeon: Wilhelmenia Aloha Raddle., MD;  Location: [facility] ENDOSCOPY;  Service: Gastroenterology;  Laterality: N/A;   HEMOSTASIS CLIP PLACEMENT  01/27/2020   Procedure: HEMOSTASIS CLIP PLACEMENT;  Surgeon: Wilhelmenia Aloha Raddle., MD;  Location: [facility] ENDOSCOPY;  Service: Gastroenterology;;   HERNIA REPAIR     12-23 with TIFF procedure also.   HIATAL HERNIA REPAIR N/A 06/17/2022   Procedure: LAPAROSCOPIC REPAIR OF HIATAL HERNIA WITH TIF;  Surgeon: Tanda Locus, MD;  Location: [facility] ORS;  Service: General;  Laterality: N/A;   SKIN LESION EXCISION     STAPEDECTOMY Bilateral    x 2 - 10 yrs apart - No MRIs per patient.   SUBMUCOSAL LIFTING INJECTION  01/27/2020   Procedure: SUBMUCOSAL LIFTING INJECTION;  Surgeon: Wilhelmenia Aloha Raddle., MD;  Location: [facility] ENDOSCOPY;  Service: Gastroenterology;;   SUBMUCOSAL TATTOO INJECTION  01/27/2020   Procedure: SUBMUCOSAL TATTOO INJECTION;  Surgeon: Wilhelmenia Aloha Raddle., MD;  Location: [facility] ENDOSCOPY;  Service: Gastroenterology;;   TRANSORAL INCISIONLESS FUNDOPLICATION N/A 06/17/2022   Procedure: TRANSORAL INCISIONLESS FUNDOPLICATION;  Surgeon: San Sandor GAILS, DO;  Location: [facility] ORS;  Service: Gastroenterology;  Laterality: N/A;   TRANSTHORACIC ECHOCARDIOGRAM  07/2017   Normal LV size and function.  EF 55-60%.  No RWMA.  Basal septal hypertrophy, but no sign of significant hypertensive heart disease.  Only GR 1 DD.   TRIGGER FINGER RELEASE Right 2014   Thumb   UPPER GI ENDOSCOPY     several - last one 11/22/2019   WISDOM TOOTH EXTRACTION      Social History   Tobacco Use   Smoking status: Never    Passive exposure: Never   Smokeless tobacco: Never  Vaping Use   Vaping status: Never Used  Substance Use Topics   Alcohol use: Not Currently    Comment: occasional wine   Drug use: No    Comment: In 53s - cocaine, marijuana   Social History   Social History Narrative   Not on file     Immunization History  Administered Date(s) Administered   Influenza Split 08/28/2007   Pneumococcal Conjugate-13 04/16/2018   Pneumococcal Polysaccharide-23 05/20/2019   Tdap 11/19/2010     Objective: Vital Signs: BP (!) 154/83 (BP Location: Right Arm, Patient Position: Sitting, Cuff Size: Normal)   Pulse 63   Temp (!) 97.4 F (36.3 C)   Resp 16   Ht 6' 1.25 (1.861 m)   Wt 224 lb 3.2 oz (101.7 kg)   BMI 29.38 kg/m    Physical Exam   Musculoskeletal Exam:  Shoulders full ROM no tenderness or swelling Elbows full ROM no tenderness or swelling Wrists full ROM no tenderness or swelling Fingers full ROM, right hand second third MCP joints with chronic bony changes Very large left knee effusion, slight restriction in range of motion, no tenderness to pressure, no warmth or erythema First MTP with large osteophytes and decreased range of motion  Musculoskeletal ultrasound exam of the left knee shows large effusion present appears to have some loculations present as well as synovial hypertrophy, appears to be lateral parameniscal cyst consistent with tear.   Investigation: No additional findings.  Imaging: No results found.  Recent Labs: Lab Results  Component Value Date   WBC 9.3 10/18/2023   HGB 14.4 10/18/2023   PLT 312 10/18/2023   NA 137 01/29/2024   K 4.7 01/29/2024   CL 100 01/29/2024   CO2 22 01/29/2024   GLUCOSE 117 (H) 01/29/2024   BUN 21 01/29/2024   CREATININE 0.97 01/29/2024   BILITOT 0.7 01/29/2024   ALKPHOS 48 01/29/2024   AST 33 01/29/2024   ALT 55 (H) 01/29/2024   PROT 6.5 01/29/2024    ALBUMIN 4.3 01/29/2024   CALCIUM  10.4 (H) 01/29/2024   GFRAA 103 11/23/2020    Speciality Comments: No specialty comments available.  Procedures:  No procedures performed Allergies: Other   Assessment / Plan:     Visit Diagnoses: Positive ANA (antinuclear antibody) Positive ANA at 1:160, negative for specific autoimmune markers. Normal inflammatory markers. No evidence of systemic autoimmune disease.  There is a very large knee effusion but with no evidence of hyperemia seems more related to underlying structural pathology.  Positive ANA likely incidental from reviewed findings and by history and exam.  Recurrent right knee effusion with meniscal and MCL tears, pending knee replacement Chronic effusion post-meniscal repair, with recent meniscal and MCL tears. Scheduled for knee replacement. Potential effusion recurrence post-surgery. Possible synovectomy if synovial hypertrophy present. - Prepare for knee replacement on May 03, 2024. - Consider synovectomy if synovial hypertrophy is observed during surgery.  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 Asymptomatic despite large knuckles and damage.  Trigger finger, right hand Trigger finger symptoms resolving during the day. History of thumb repair post-2017.  Osteoarthritis of right ankle Osteoarthritis with history of sports injuries. Left ankle less affected.    Orders: No orders of the defined types were placed in this encounter.  No orders of the defined types were placed in this encounter.    Follow-Up Instructions: Return if symptoms worsen or fail to improve.   Lonni LELON Ester, MD  Note - This record has been created using [employer].  Chart creation errors have been sought, but may not always  have been located. Such creation errors do not reflect on  the standard of medical care.

## 2024-04-01 ENCOUNTER — Encounter: Payer: Self-pay | Admitting: Cardiology

## 2024-04-09 DIAGNOSIS — I10 Essential (primary) hypertension: Secondary | ICD-10-CM | POA: Diagnosis not present

## 2024-04-09 DIAGNOSIS — Z01818 Encounter for other preprocedural examination: Secondary | ICD-10-CM | POA: Diagnosis not present

## 2024-04-09 DIAGNOSIS — M1712 Unilateral primary osteoarthritis, left knee: Secondary | ICD-10-CM | POA: Diagnosis not present

## 2024-04-09 DIAGNOSIS — M1711 Unilateral primary osteoarthritis, right knee: Secondary | ICD-10-CM | POA: Diagnosis not present

## 2024-04-09 DIAGNOSIS — K219 Gastro-esophageal reflux disease without esophagitis: Secondary | ICD-10-CM | POA: Diagnosis not present

## 2024-04-09 DIAGNOSIS — R29818 Other symptoms and signs involving the nervous system: Secondary | ICD-10-CM | POA: Diagnosis not present

## 2024-04-09 DIAGNOSIS — E119 Type 2 diabetes mellitus without complications: Secondary | ICD-10-CM | POA: Diagnosis not present

## 2024-04-09 DIAGNOSIS — E785 Hyperlipidemia, unspecified: Secondary | ICD-10-CM | POA: Diagnosis not present

## 2024-04-09 DIAGNOSIS — R931 Abnormal findings on diagnostic imaging of heart and coronary circulation: Secondary | ICD-10-CM | POA: Diagnosis not present

## 2024-04-09 DIAGNOSIS — Z9621 Cochlear implant status: Secondary | ICD-10-CM | POA: Diagnosis not present

## 2024-04-20 ENCOUNTER — Encounter: Payer: Self-pay | Admitting: Family Medicine

## 2024-04-29 ENCOUNTER — Ambulatory Visit: Admitting: Internal Medicine

## 2024-05-03 DIAGNOSIS — Z7982 Long term (current) use of aspirin: Secondary | ICD-10-CM | POA: Diagnosis not present

## 2024-05-03 DIAGNOSIS — M6281 Muscle weakness (generalized): Secondary | ICD-10-CM | POA: Diagnosis not present

## 2024-05-03 DIAGNOSIS — Z96652 Presence of left artificial knee joint: Secondary | ICD-10-CM | POA: Diagnosis not present

## 2024-05-03 DIAGNOSIS — I4510 Unspecified right bundle-branch block: Secondary | ICD-10-CM | POA: Diagnosis not present

## 2024-05-03 DIAGNOSIS — Z471 Aftercare following joint replacement surgery: Secondary | ICD-10-CM | POA: Diagnosis not present

## 2024-05-03 DIAGNOSIS — M25662 Stiffness of left knee, not elsewhere classified: Secondary | ICD-10-CM | POA: Diagnosis not present

## 2024-05-03 DIAGNOSIS — E119 Type 2 diabetes mellitus without complications: Secondary | ICD-10-CM | POA: Diagnosis not present

## 2024-05-03 DIAGNOSIS — R262 Difficulty in walking, not elsewhere classified: Secondary | ICD-10-CM | POA: Diagnosis not present

## 2024-05-03 DIAGNOSIS — E785 Hyperlipidemia, unspecified: Secondary | ICD-10-CM | POA: Diagnosis not present

## 2024-05-03 DIAGNOSIS — M1712 Unilateral primary osteoarthritis, left knee: Secondary | ICD-10-CM | POA: Diagnosis not present

## 2024-05-03 DIAGNOSIS — Z7984 Long term (current) use of oral hypoglycemic drugs: Secondary | ICD-10-CM | POA: Diagnosis not present

## 2024-05-03 DIAGNOSIS — M9689 Other intraoperative and postprocedural complications and disorders of the musculoskeletal system: Secondary | ICD-10-CM | POA: Diagnosis not present

## 2024-05-03 DIAGNOSIS — R2689 Other abnormalities of gait and mobility: Secondary | ICD-10-CM | POA: Diagnosis not present

## 2024-05-03 DIAGNOSIS — K219 Gastro-esophageal reflux disease without esophagitis: Secondary | ICD-10-CM | POA: Diagnosis not present

## 2024-05-03 DIAGNOSIS — I10 Essential (primary) hypertension: Secondary | ICD-10-CM | POA: Diagnosis not present

## 2024-05-03 DIAGNOSIS — Z79899 Other long term (current) drug therapy: Secondary | ICD-10-CM | POA: Diagnosis not present

## 2024-05-03 DIAGNOSIS — I2510 Atherosclerotic heart disease of native coronary artery without angina pectoris: Secondary | ICD-10-CM | POA: Diagnosis not present

## 2024-05-06 ENCOUNTER — Other Ambulatory Visit: Payer: Self-pay | Admitting: Family Medicine

## 2024-05-06 MED ORDER — METFORMIN HCL ER 500 MG PO TB24: 1000.0000 mg | ORAL_TABLET | Freq: Two times a day (BID) | ORAL | 1 refills | Status: DC

## 2024-05-07 ENCOUNTER — Encounter: Payer: Self-pay | Admitting: Family Medicine

## 2024-05-07 ENCOUNTER — Ambulatory Visit: Admitting: Internal Medicine

## 2024-05-08 DIAGNOSIS — M25562 Pain in left knee: Secondary | ICD-10-CM | POA: Diagnosis not present

## 2024-05-09 ENCOUNTER — Encounter: Payer: Self-pay | Admitting: Family Medicine

## 2024-05-09 DIAGNOSIS — R55 Syncope and collapse: Secondary | ICD-10-CM | POA: Diagnosis not present

## 2024-05-09 DIAGNOSIS — R7989 Other specified abnormal findings of blood chemistry: Secondary | ICD-10-CM | POA: Diagnosis not present

## 2024-05-09 DIAGNOSIS — Z7984 Long term (current) use of oral hypoglycemic drugs: Secondary | ICD-10-CM | POA: Diagnosis not present

## 2024-05-09 DIAGNOSIS — E119 Type 2 diabetes mellitus without complications: Secondary | ICD-10-CM | POA: Diagnosis not present

## 2024-05-09 DIAGNOSIS — Z79899 Other long term (current) drug therapy: Secondary | ICD-10-CM | POA: Diagnosis not present

## 2024-05-09 DIAGNOSIS — R072 Precordial pain: Secondary | ICD-10-CM | POA: Diagnosis not present

## 2024-05-09 DIAGNOSIS — I2510 Atherosclerotic heart disease of native coronary artery without angina pectoris: Secondary | ICD-10-CM | POA: Diagnosis not present

## 2024-05-09 DIAGNOSIS — Z7982 Long term (current) use of aspirin: Secondary | ICD-10-CM | POA: Diagnosis not present

## 2024-05-09 DIAGNOSIS — I10 Essential (primary) hypertension: Secondary | ICD-10-CM | POA: Diagnosis not present

## 2024-05-09 DIAGNOSIS — K219 Gastro-esophageal reflux disease without esophagitis: Secondary | ICD-10-CM | POA: Diagnosis not present

## 2024-05-09 DIAGNOSIS — E785 Hyperlipidemia, unspecified: Secondary | ICD-10-CM | POA: Diagnosis not present

## 2024-05-09 DIAGNOSIS — I4510 Unspecified right bundle-branch block: Secondary | ICD-10-CM | POA: Diagnosis not present

## 2024-05-09 DIAGNOSIS — R0602 Shortness of breath: Secondary | ICD-10-CM | POA: Diagnosis not present

## 2024-05-09 DIAGNOSIS — I959 Hypotension, unspecified: Secondary | ICD-10-CM | POA: Diagnosis not present

## 2024-05-09 DIAGNOSIS — R4182 Altered mental status, unspecified: Secondary | ICD-10-CM | POA: Diagnosis not present

## 2024-05-13 ENCOUNTER — Encounter: Payer: Self-pay | Admitting: Cardiology

## 2024-05-13 DIAGNOSIS — M25562 Pain in left knee: Secondary | ICD-10-CM | POA: Diagnosis not present

## 2024-05-13 NOTE — Telephone Encounter (Signed)
 I would say for now until we see how his pressures bounced back lets have him stop his amlodipine  and cut the olmesartan -HCTZ dose in half. Take the olmesartan  HCTZ dose in the morning after breakfast.  If blood pressure seems low during the day, then take one half of the carvedilol  dose at nighttime.  Make sure that you are doing adequate hydration.  Sometimes postoperatively it takes a little while for the physiology to kick back in for normal function.  Alm Clay, MD

## 2024-05-14 ENCOUNTER — Encounter: Payer: Self-pay | Admitting: Family Medicine

## 2024-05-14 ENCOUNTER — Ambulatory Visit (INDEPENDENT_AMBULATORY_CARE_PROVIDER_SITE_OTHER): Admitting: Family Medicine

## 2024-05-14 VITALS — BP 135/71 | HR 93 | Temp 97.3°F | Ht 73.0 in | Wt 220.0 lb

## 2024-05-14 DIAGNOSIS — Z96652 Presence of left artificial knee joint: Secondary | ICD-10-CM | POA: Diagnosis not present

## 2024-05-14 DIAGNOSIS — D500 Iron deficiency anemia secondary to blood loss (chronic): Secondary | ICD-10-CM | POA: Diagnosis not present

## 2024-05-14 DIAGNOSIS — R3989 Other symptoms and signs involving the genitourinary system: Secondary | ICD-10-CM | POA: Diagnosis not present

## 2024-05-14 NOTE — Assessment & Plan Note (Signed)
CBC diff today 

## 2024-05-14 NOTE — Progress Notes (Signed)
 Established Patient Office Visit  Subjective   Patient ID: Nathaniel Gill, male    DOB: Jul 26, 1950  Age: 73 y.o. MRN: [id number]  Chief Complaint  Patient presents with   Hospitalization Follow-up    While in CT scan waiting room, blood pressure went very low.  Went to ED, IV fluids.  Labs done. Troponins negative x 3 BNP negative.  Medications adjusted.  Message per cardiology for further instructions on med adjustments.  Information provided per cardiology note.  Blood pressure at home: 100's/150's/70-90's Recommend seeing cardiology for follow-up on low blood pressure.  Seeing ortho today at 1:00.   Urine color is concerning him. Reports some pain in left flank. No pain without leaning over. No fever.  No urinary burning. No muscle aches.          Review of Systems  Constitutional:  Positive for malaise/fatigue. Negative for chills and fever.  Eyes:  Negative for blurred vision and double vision.  Respiratory:  Negative for shortness of breath.   Cardiovascular:  Negative for chest pain.  Gastrointestinal:  Negative for abdominal pain, nausea and vomiting.  Genitourinary:  Negative for dysuria, flank pain, frequency, hematuria and urgency.  Musculoskeletal:  Negative for myalgias.  Neurological:  Positive for dizziness (occassional) and headaches.      Objective:     BP 135/71 (BP Location: Left Arm, Patient Position: Sitting, Cuff Size: Normal)   Pulse 93   Temp (!) 97.3 F (36.3 C) (Oral)   Ht 6' 1 (1.854 m)   Wt 220 lb (99.8 kg)   SpO2 99%   BMI 29.03 kg/m    Physical Exam Vitals and nursing note reviewed.  Constitutional:      General: He is not in acute distress.    Appearance: Normal appearance.  Cardiovascular:     Rate and Rhythm: Normal rate and regular rhythm.     Heart sounds: Normal heart sounds.  Pulmonary:     Breath sounds: Normal breath sounds.  Abdominal:     Tenderness: There is no right CVA tenderness or left CVA  tenderness.  Skin:    General: Skin is warm and dry.  Neurological:     General: No focal deficit present.     Mental Status: He is alert. Mental status is at baseline.  Psychiatric:        Mood and Affect: Mood normal.        Behavior: Behavior normal.        Thought Content: Thought content normal.        Judgment: Judgment normal.      No results found for any visits on 05/14/24.    The ASCVD Risk score (Arnett DK, et al., 2019) failed to calculate for the following reasons:   The valid total cholesterol range is 130 to 320 mg/dL    Assessment & Plan:   Problem List Items Addressed This Visit     Status post left knee replacement - Primary   Seeing ortho today for follow-up. Had left knee replacement with femur fracture during procedure. Had episode of severe hypotension and needed to go to the ED for resuscitation.  Continues to feel fatigue. CBC/diff today to assess anemia from surgery. 11/6: H&H: 10.1/30.3 Blood pressure stable today at 135/71, oxygen saturation 99%.  Cardiology guidance provided on how to take blood pressure medications while recovering.        Relevant Orders   CBC with Differential/Platelet   Anemia due to blood loss  CBC/diff today.       Relevant Orders   CBC with Differential/Platelet   Abnormal urine color   No urinary symptoms. Reports some left back pain with bending. No CVA tenderness in office today. Staying hydrated. Urinalysis today for evaluation. Low suspicion of  UTI.       Relevant Orders   Urinalysis, Routine w reflex microscopic  Agrees with plan of care discussed.  Questions answered.   Return if symptoms worsen or fail to improve.    Darice JONELLE Brownie, FNP

## 2024-05-14 NOTE — Assessment & Plan Note (Addendum)
 Seeing ortho today for follow-up. Had left knee replacement with femur fracture during procedure. Had episode of severe hypotension and needed to go to the ED for resuscitation.  Continues to feel fatigue. CBC/diff today to assess anemia from surgery. 11/6: H&H: 10.1/30.3 Blood pressure stable today at 135/71, oxygen saturation 99%.  Cardiology guidance provided on how to take blood pressure medications while recovering.

## 2024-05-14 NOTE — Patient Instructions (Signed)
 Per Cardiology:   I would say for now until we see how his pressures bounced back lets have him stop his amlodipine  and cut the olmesartan -HCTZ dose in half. Take the olmesartan  HCTZ dose in the morning after breakfast.  If blood pressure seems low during the day, then take one half of the carvedilol  dose at nighttime.   Make sure that you are doing adequate hydration.   Sometimes postoperatively it takes a little while for the physiology to kick back in for normal function.   Alm Clay, MD

## 2024-05-14 NOTE — Assessment & Plan Note (Signed)
 No urinary symptoms. Reports some left back pain with bending. No CVA tenderness in office today. Staying hydrated. Urinalysis today for evaluation. Low suspicion of  UTI.

## 2024-05-15 ENCOUNTER — Ambulatory Visit: Payer: Self-pay | Admitting: Family Medicine

## 2024-05-15 DIAGNOSIS — M25562 Pain in left knee: Secondary | ICD-10-CM | POA: Diagnosis not present

## 2024-05-15 LAB — CBC WITH DIFFERENTIAL/PLATELET
Basophils Absolute: 0.2 x10E3/uL (ref 0.0–0.2)
Basos: 1 %
EOS (ABSOLUTE): 0.5 x10E3/uL — ABNORMAL HIGH (ref 0.0–0.4)
Eos: 5 %
Hematocrit: 35.6 % — ABNORMAL LOW (ref 37.5–51.0)
Hemoglobin: 11.5 g/dL — ABNORMAL LOW (ref 13.0–17.7)
Immature Grans (Abs): 0.1 x10E3/uL (ref 0.0–0.1)
Immature Granulocytes: 1 %
Lymphocytes Absolute: 1.5 x10E3/uL (ref 0.7–3.1)
Lymphs: 14 %
MCH: 30.1 pg (ref 26.6–33.0)
MCHC: 32.3 g/dL (ref 31.5–35.7)
MCV: 93 fL (ref 79–97)
Monocytes Absolute: 1 x10E3/uL — ABNORMAL HIGH (ref 0.1–0.9)
Monocytes: 10 %
Neutrophils Absolute: 7.3 x10E3/uL — ABNORMAL HIGH (ref 1.4–7.0)
Neutrophils: 69 %
Platelets: 761 x10E3/uL — ABNORMAL HIGH (ref 150–450)
RBC: 3.82 x10E6/uL — ABNORMAL LOW (ref 4.14–5.80)
RDW: 12.5 % (ref 11.6–15.4)
WBC: 10.5 x10E3/uL (ref 3.4–10.8)

## 2024-05-15 LAB — URINALYSIS, ROUTINE W REFLEX MICROSCOPIC
Bilirubin, UA: NEGATIVE
Glucose, UA: NEGATIVE
Ketones, UA: NEGATIVE
Leukocytes,UA: NEGATIVE
Nitrite, UA: NEGATIVE
Protein,UA: NEGATIVE
RBC, UA: NEGATIVE
Specific Gravity, UA: 1.017 (ref 1.005–1.030)
Urobilinogen, Ur: 0.2 mg/dL (ref 0.2–1.0)
pH, UA: 6.5 (ref 5.0–7.5)

## 2024-05-16 ENCOUNTER — Ambulatory Visit: Attending: Cardiology | Admitting: Cardiology

## 2024-05-16 ENCOUNTER — Encounter: Payer: Self-pay | Admitting: Cardiology

## 2024-05-16 VITALS — BP 108/56 | Ht 73.0 in | Wt 213.0 lb

## 2024-05-16 DIAGNOSIS — I2510 Atherosclerotic heart disease of native coronary artery without angina pectoris: Secondary | ICD-10-CM | POA: Diagnosis not present

## 2024-05-16 DIAGNOSIS — I951 Orthostatic hypotension: Secondary | ICD-10-CM | POA: Diagnosis not present

## 2024-05-16 DIAGNOSIS — E1169 Type 2 diabetes mellitus with other specified complication: Secondary | ICD-10-CM

## 2024-05-16 DIAGNOSIS — R011 Cardiac murmur, unspecified: Secondary | ICD-10-CM | POA: Diagnosis not present

## 2024-05-16 DIAGNOSIS — E785 Hyperlipidemia, unspecified: Secondary | ICD-10-CM

## 2024-05-16 DIAGNOSIS — I77819 Aortic ectasia, unspecified site: Secondary | ICD-10-CM | POA: Diagnosis not present

## 2024-05-16 MED ORDER — CARVEDILOL 12.5 MG PO TABS: ORAL_TABLET | ORAL | Status: AC

## 2024-05-16 NOTE — Progress Notes (Signed)
 Cardiology Office Note:  .   Date:  05/16/2024  ID:  TERIQUE KAWABATA, DOB Sep 08, 1950, MRN [id number] PCP: Booker Darice SAUNDERS, FNP  [facility] Providers Cardiologist:  Alm Clay, MD {  History of Present Illness: Nathaniel   ARMON Gill is a 73 y.o. male with history of nonobstructive CAD, hypertension, hyperlipidemia, diabetes, right bundle branch block, cochlear implant, arthritis, esophageal stricture, aortic dilatation, GERD, mild carotid artery disease.     CAD Coronary CTA 07/2021 CAC score 837.  Mild to moderate plaque diffusely, FFR was significant in small caliber OM1 branch.  Medical management.  Other 07/2017 normal renal Dopplers     Patient with history of CAD with positive FFR in distal LCx although small caliber.  Given lack of symptoms this has been medically managed.  Also with history of resistant hypertension although more controlled this past year.  We had seen him last 03/2024 preoperatively for knee replacement.  Did not need any further testing.  Patient was seen in the emergency room 05/2024 reporting episode of near syncope.  Note reports that he had recent knee replacement and he was at a outpatient CT noted be hypotensive with a blood pressure 62/45 and had been dizzy upon standing up.  Wife reported oxycodone  use.  CTA negative for PE.  Carvedilol  was discontinued as well as amlodipine .  He messaged our office and continued to report  hypotensive measurements.  Dr. Clay also recommended to stop amlodipine .  Cut olmesartan -HCTZ dose in half.  He had also reported weight loss of 15 pounds in 11 days.  Patient is here today accompanied with his wife.  They report that since his orthopedic procedure he has had multiple episodes of orthostasis and has had very low blood pressures upon positional changes.  However has not had any actual syncopal episodes.  They do report that since making the changes as above per Dr. Clay he had improvement in symptoms and blood  pressure now has been at baseline around 120s to 130s though after standing up will drop into the 90s systolic.  They also report that he had significant amount of blood loss during the procedure and has been anemic.  Platelets also have been elevated 700+.  PCP just checked labs on him a couple days ago.  Additionally he is very functional at baseline and him and his wife will go to OT fitness workout 4 days a week.   ROS: Denies: Chest pain, shortness of breath, orthopnea, peripheral edema, palpitations, decreased exercise intolerance, fatigue.   Studies Reviewed: Nathaniel       EKG 11/6 noted sinus rhythm with right bundle branch block.  Risk Assessment/Calculations:         Physical Exam:   VS:  BP (!) 108/56   Ht 6' 1 (1.854 m)   Wt 213 lb (96.6 kg)   SpO2 96%   BMI 28.10 kg/m    Wt Readings from Last 3 Encounters:  05/16/24 213 lb (96.6 kg)  05/14/24 220 lb (99.8 kg)  03/28/24 224 lb 3.2 oz (101.7 kg)    GEN: Well nourished, well developed in no acute distress NECK: No JVD; No carotid bruits CARDIAC: RRR, 3 out of 6 murmur at the apex RESPIRATORY:  Clear to auscultation without rales, wheezing or rhonchi  ABDOMEN: Soft, non-tender, non-distended EXTREMITIES:  No edema; No deformity   ASSESSMENT AND PLAN: .    Orthostatic hypotension  Historically he has history of resistant hypertension.  His current presentation seems very  typical for orthostatic hypotension exacerbated by recent orthopedic procedure with acute blood loss anemia exacerbated by weight loss and polypharmacy. Symptoms have improved.  For now we will continue current regiment as to not make any drastic changes and have to add back therapy aggressively when he fully recovers. Continue with carvedilol  6.25 mg at night.  Previously on 12.5 mg twice daily. Continue with Benicar  half a tablet 20-12.5 mg daily.  Previously was taking full tablet. Continue to hold amlodipine  10 mg. Continue to monitor blood pressure,  will likely be able to reinstate some of these blood pressure medications in the upcoming weeks. Discussed extensively about secondary prevention and slower positional changes, increased salt intake, hydration, compression stockings, abdominal binder. Positive orthostatics.  146/69, 128/72, 104/64, 123/75.  CAD Hyperlipidemia Coronary CTA 2023 with mild to moderate plaque, FFR positive and small caliber OM1.  Given he has been asymptomatic this has been medically managed. Continue with aspirin .  He is on twice daily dosing per orthopedic, resume daily when okay to do so. Continue rosuvastatin  40 mg daily.  Cholesterol well-controlled and LDL was 50 01/2024.  Diabetes A1c 6.8%.  Murmur Suspect that he may have some mitral valve disease, completely asymptomatic with very good functional capacity.  Getting echocardiogram.  Aortic dilatation CT 07/2021 with ascending aorta measuring 40 mm.  Will repeat echocardiogram.  Elevated platelets Anemia PCP is managing this.  Would continue to draw CBC until this normalizes.  Did also have elevated neutrophil count but no obvious signs of infection right now.  Closely monitor.  Dispo: Follow-up in about 3 to 4 months with myself or Dr. Anner to review echocardiogram and to further titrate back BP meds if indicated.  Signed, Thom LITTIE Sluder, PA-C

## 2024-05-16 NOTE — Patient Instructions (Addendum)
 Medication Instructions:  Olmesartan /Hydrochlorothiazide  (Benicar ) 20-12.5 mg- take half a tablet daily  *If you need a refill on your cardiac medications before your next appointment, please call your pharmacy*  Lab Work: NONE ordered at this time of appointment   Testing/Procedures: Your physician has requested that you have an echocardiogram. Echocardiography is a painless test that uses sound waves to create images of your heart. It provides your doctor with information about the size and shape of your heart and how well your heart's chambers and valves are working. This procedure takes approximately one hour. There are no restrictions for this procedure. Please do NOT wear cologne, perfume, aftershave, or lotions (deodorant is allowed). Please arrive 15 minutes prior to your appointment time.  Please note: We ask at that you not bring children with you during ultrasound (echo/ vascular) testing. Due to room size and safety concerns, children are not allowed in the ultrasound rooms during exams. Our front office staff cannot provide observation of children in our lobby area while testing is being conducted. An adult accompanying a patient to their appointment will only be allowed in the ultrasound room at the discretion of the ultrasound technician under special circumstances. We apologize for any inconvenience.   Follow-Up: At [facility], you and your health needs are our priority.  As part of our continuing mission to provide you with exceptional heart care, our providers are all part of one team.  This team includes your primary Cardiologist (physician) and Advanced Practice Providers or APPs (Physician Assistants and Nurse Practitioners) who all work together to provide you with the care you need, when you need it.  Your next appointment:   3-4 month(s)  Provider:   Alm Clay, MD or Thom Sluder, PA-C          We recommend signing up for the patient portal called  MyChart.  Sign up information is provided on this After Visit Summary.  MyChart is used to connect with patients for Virtual Visits (Telemedicine).  Patients are able to view lab/test results, encounter notes, upcoming appointments, etc.  Non-urgent messages can be sent to your provider as well.   To learn more about what you can do with MyChart, go to [email or web address].   Other Instructions      .

## 2024-05-17 ENCOUNTER — Other Ambulatory Visit: Payer: Self-pay | Admitting: Cardiology

## 2024-05-20 ENCOUNTER — Other Ambulatory Visit: Payer: Self-pay | Admitting: Family Medicine

## 2024-05-20 ENCOUNTER — Encounter: Payer: Self-pay | Admitting: Family Medicine

## 2024-05-20 DIAGNOSIS — M25562 Pain in left knee: Secondary | ICD-10-CM | POA: Diagnosis not present

## 2024-05-20 DIAGNOSIS — Z96652 Presence of left artificial knee joint: Secondary | ICD-10-CM | POA: Diagnosis not present

## 2024-05-21 ENCOUNTER — Ambulatory Visit: Payer: Self-pay | Admitting: Family Medicine

## 2024-05-21 LAB — CBC WITH DIFFERENTIAL/PLATELET
Basophils Absolute: 0.1 x10E3/uL (ref 0.0–0.2)
Basos: 1 %
EOS (ABSOLUTE): 0.4 x10E3/uL (ref 0.0–0.4)
Eos: 5 %
Hematocrit: 35 % — ABNORMAL LOW (ref 37.5–51.0)
Hemoglobin: 11.3 g/dL — ABNORMAL LOW (ref 13.0–17.7)
Immature Grans (Abs): 0 x10E3/uL (ref 0.0–0.1)
Immature Granulocytes: 0 %
Lymphocytes Absolute: 1.3 x10E3/uL (ref 0.7–3.1)
Lymphs: 16 %
MCH: 30.1 pg (ref 26.6–33.0)
MCHC: 32.3 g/dL (ref 31.5–35.7)
MCV: 93 fL (ref 79–97)
Monocytes Absolute: 0.8 x10E3/uL (ref 0.1–0.9)
Monocytes: 9 %
Neutrophils Absolute: 5.7 x10E3/uL (ref 1.4–7.0)
Neutrophils: 69 %
Platelets: 589 x10E3/uL — ABNORMAL HIGH (ref 150–450)
RBC: 3.76 x10E6/uL — ABNORMAL LOW (ref 4.14–5.80)
RDW: 12.7 % (ref 11.6–15.4)
WBC: 8.3 x10E3/uL (ref 3.4–10.8)

## 2024-05-21 LAB — COMPREHENSIVE METABOLIC PANEL WITH GFR
ALT: 27 IU/L (ref 0–44)
AST: 28 IU/L (ref 0–40)
Albumin: 4.2 g/dL (ref 3.8–4.8)
Alkaline Phosphatase: 75 IU/L (ref 47–123)
BUN/Creatinine Ratio: 11 (ref 10–24)
BUN: 12 mg/dL (ref 8–27)
Bilirubin Total: 0.6 mg/dL (ref 0.0–1.2)
CO2: 24 mmol/L (ref 20–29)
Calcium: 10.3 mg/dL — ABNORMAL HIGH (ref 8.6–10.2)
Chloride: 100 mmol/L (ref 96–106)
Creatinine, Ser: 1.14 mg/dL (ref 0.76–1.27)
Globulin, Total: 2.3 g/dL (ref 1.5–4.5)
Glucose: 154 mg/dL — ABNORMAL HIGH (ref 70–99)
Potassium: 4.6 mmol/L (ref 3.5–5.2)
Sodium: 138 mmol/L (ref 134–144)
Total Protein: 6.5 g/dL (ref 6.0–8.5)
eGFR: 69 mL/min/1.73 (ref >59)

## 2024-05-22 DIAGNOSIS — M25562 Pain in left knee: Secondary | ICD-10-CM | POA: Diagnosis not present

## 2024-05-27 DIAGNOSIS — M25562 Pain in left knee: Secondary | ICD-10-CM | POA: Diagnosis not present

## 2024-05-27 MED ORDER — AMLODIPINE BESYLATE 10 MG PO TABS: 10.0000 mg | ORAL_TABLET | Freq: Every day | ORAL | 3 refills | Status: DC

## 2024-05-31 ENCOUNTER — Other Ambulatory Visit: Payer: Self-pay | Admitting: Nurse Practitioner

## 2024-05-31 DIAGNOSIS — I700 Atherosclerosis of aorta: Secondary | ICD-10-CM

## 2024-05-31 DIAGNOSIS — E1169 Type 2 diabetes mellitus with other specified complication: Secondary | ICD-10-CM

## 2024-06-13 ENCOUNTER — Other Ambulatory Visit: Payer: Self-pay | Admitting: Family Medicine

## 2024-06-13 ENCOUNTER — Encounter: Payer: Self-pay | Admitting: Cardiology

## 2024-06-13 ENCOUNTER — Encounter: Payer: Self-pay | Admitting: Family Medicine

## 2024-06-13 DIAGNOSIS — D500 Iron deficiency anemia secondary to blood loss (chronic): Secondary | ICD-10-CM

## 2024-06-13 NOTE — Telephone Encounter (Signed)
 Recommend cutting the amlodipine  dose in half to 5 mg daily.   Alm Clay, MD

## 2024-06-14 ENCOUNTER — Ambulatory Visit: Payer: Self-pay | Admitting: Family Medicine

## 2024-06-14 LAB — CBC WITH DIFFERENTIAL/PLATELET
Basophils Absolute: 0.1 x10E3/uL (ref 0.0–0.2)
Basos: 1 %
EOS (ABSOLUTE): 0.5 x10E3/uL — ABNORMAL HIGH (ref 0.0–0.4)
Eos: 8 %
Hematocrit: 40.8 % (ref 37.5–51.0)
Hemoglobin: 13.1 g/dL (ref 13.0–17.7)
Immature Grans (Abs): 0 x10E3/uL (ref 0.0–0.1)
Immature Granulocytes: 0 %
Lymphocytes Absolute: 1.6 x10E3/uL (ref 0.7–3.1)
Lymphs: 23 %
MCH: 29.4 pg (ref 26.6–33.0)
MCHC: 32.1 g/dL (ref 31.5–35.7)
MCV: 92 fL (ref 79–97)
Monocytes Absolute: 0.9 x10E3/uL (ref 0.1–0.9)
Monocytes: 12 %
Neutrophils Absolute: 3.9 x10E3/uL (ref 1.4–7.0)
Neutrophils: 56 %
Platelets: 425 x10E3/uL (ref 150–450)
RBC: 4.46 x10E6/uL (ref 4.14–5.80)
RDW: 12.7 % (ref 11.6–15.4)
WBC: 6.9 x10E3/uL (ref 3.4–10.8)

## 2024-06-14 MED ORDER — AMLODIPINE BESYLATE 10 MG PO TABS: 5.0000 mg | ORAL_TABLET | Freq: Every day | ORAL | Status: AC

## 2024-06-14 NOTE — Addendum Note (Signed)
 Addended by: LORING ANDRIETTE HERO on: 06/14/2024 08:08 AM   Modules accepted: Orders

## 2024-06-25 ENCOUNTER — Ambulatory Visit (HOSPITAL_COMMUNITY)
Admission: RE | Admit: 2024-06-25 | Discharge: 2024-06-25 | Disposition: A | Discharge status: Home / Self Care | Source: Ambulatory Visit | Attending: Internal Medicine | Admitting: Internal Medicine

## 2024-06-25 DIAGNOSIS — R011 Cardiac murmur, unspecified: Secondary | ICD-10-CM | POA: Diagnosis present

## 2024-06-25 DIAGNOSIS — I77819 Aortic ectasia, unspecified site: Secondary | ICD-10-CM | POA: Insufficient documentation

## 2024-06-25 LAB — ECHOCARDIOGRAM COMPLETE
AR max vel: 3.38 cm2
AV Area VTI: 3.34 cm2
AV Area mean vel: 3.56 cm2
AV Mean grad: 6 mmHg
AV Peak grad: 10.9 mmHg
Ao pk vel: 1.65 m/s
Area-P 1/2: 3.91 cm2
MV VTI: 3.48 cm2
S' Lateral: 3 cm

## 2024-06-26 ENCOUNTER — Ambulatory Visit: Payer: Self-pay | Admitting: Cardiology

## 2024-06-28 ENCOUNTER — Other Ambulatory Visit: Payer: Self-pay | Admitting: Nurse Practitioner

## 2024-06-28 DIAGNOSIS — I700 Atherosclerosis of aorta: Secondary | ICD-10-CM

## 2024-06-28 DIAGNOSIS — E1169 Type 2 diabetes mellitus with other specified complication: Secondary | ICD-10-CM

## 2024-07-01 ENCOUNTER — Other Ambulatory Visit: Payer: Self-pay

## 2024-07-01 DIAGNOSIS — I700 Atherosclerosis of aorta: Secondary | ICD-10-CM

## 2024-07-01 DIAGNOSIS — E1169 Type 2 diabetes mellitus with other specified complication: Secondary | ICD-10-CM

## 2024-07-01 MED ORDER — ROSUVASTATIN CALCIUM 40 MG PO TABS: ORAL_TABLET | ORAL | 3 refills | Status: AC

## 2024-07-10 ENCOUNTER — Telehealth: Payer: Self-pay

## 2024-07-10 NOTE — Progress Notes (Unsigned)
 Pharmacy Quality Measure Review   This patient is appearing on a report for being at risk of failing the adherence measure for diabetes (metformin ) medications this calendar year.   Medication: Metformin  500mg  tablet Last fill date: 06/20/2024 for a 45 day supply   Patient is currently adherent. Next refill will be due on 2/1. No intervention needed at this time  Marguerita Stapp Student-PharmD

## 2024-07-17 ENCOUNTER — Encounter: Payer: PPO | Admitting: Nurse Practitioner

## 2024-07-17 ENCOUNTER — Telehealth: Payer: Self-pay | Admitting: *Deleted

## 2024-07-17 DIAGNOSIS — M069 Rheumatoid arthritis, unspecified: Secondary | ICD-10-CM

## 2024-07-17 NOTE — Telephone Encounter (Signed)
 I called patient and informed him that we're going to need xrays of his feet.  I asked him to arrive 20 minutes prior to his appointment and stop by imaging first.

## 2024-07-18 ENCOUNTER — Ambulatory Visit

## 2024-07-18 ENCOUNTER — Ambulatory Visit: Admitting: Podiatry

## 2024-07-18 ENCOUNTER — Encounter: Payer: Self-pay | Admitting: Podiatry

## 2024-07-18 DIAGNOSIS — M205X2 Other deformities of toe(s) (acquired), left foot: Secondary | ICD-10-CM | POA: Diagnosis not present

## 2024-07-18 DIAGNOSIS — M069 Rheumatoid arthritis, unspecified: Secondary | ICD-10-CM

## 2024-07-18 DIAGNOSIS — M79671 Pain in right foot: Secondary | ICD-10-CM | POA: Diagnosis not present

## 2024-07-18 DIAGNOSIS — M205X1 Other deformities of toe(s) (acquired), right foot: Secondary | ICD-10-CM | POA: Diagnosis not present

## 2024-07-18 DIAGNOSIS — M79672 Pain in left foot: Secondary | ICD-10-CM

## 2024-07-18 MED ORDER — MELOXICAM 15 MG PO TABS: 15.0000 mg | ORAL_TABLET | Freq: Every day | ORAL | 0 refills | Status: AC

## 2024-07-18 NOTE — Progress Notes (Signed)
 "  Subjective:  Patient ID: Nathaniel Gill, male    DOB: [birth year],   MRN: [id number]  Chief Complaint  Patient presents with   Foot Pain    I have Arthritis in both feet.  I have pain when I walk, especially the left big toe.  I just had knee surgery and it's affecting my recovery.    74 y.o. male presents for concern of bilateral great toe pain.  This has been ongoing for several years.  He relates he has had spurring on the tops of both great toes and recently the pain has been getting worse and worse.  He relates anytime he is up on his toes it is very painful.  He relates certain shoes do help.  He has been taking Celebrex  for knee pain and he is not sure if this is helping with his foot pain or with his knee pain.. Denies any other pedal complaints. Denies n/v/f/c.   Past Medical History:  Diagnosis Date   Aortic atherosclerosis    Arthritis    CAD (coronary artery disease) 07/13/2021   Cor Ca++ Score 837.  Mixed mod plaque prox RCA(50 to 69%).  Mild Ca++ plaque mid RCA (25-49%).  Minimal Ca++ plaque prox LCx (0-24%).  Mid LCx CA++ plaque (50 to 69%), Prox OM1 CA++ plaque (50-69%).  Prox LAD mild to 0-24%. LM  mild 0-24% Ca++  plaque. => CT FFR + for distal LCx (small caliber)  Negative findings in the LAD, OM1 and  and RCA. =>  Would not recommend catheterization.   Clotting disorder    Just recently.   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 Albertus:  Low-grade of narrowing Schatzki ring. Dilated to 18 mm with balloon. - 3 cm hiatal hernia. - Normal stomach. 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    Gallstone    Gastroparesis    GERD (gastroesophageal reflux disease)    Hiatal hernia    History of colon polyps    Hyperlipidemia     Hypertension    No sign of renal artery stenosis   IBS (irritable bowel syndrome)    Inguinal hernia, bilateral    Pre-diabetes    RBBB    Schatzki's ring    Vertigo    Vitamin D  deficiency     Objective:  Physical Exam: Vascular: DP/PT pulses 2/4 bilateral. CFT <3 seconds. Normal hair growth on digits. No edema.  Skin. No lacerations or abrasions bilateral feet.  Musculoskeletal: MMT 5/5 bilateral lower extremities in DF, PF, Inversion and Eversion. Deceased ROM in DF of ankle joint.  Tender to dorsum of bilateral first metatarsal phalangeal joints.  Worse on the left.  Minimal range of motion of first MPJ with crepitus noted and tenderness with end range of motion. Neurological: Sensation intact to light touch.   Assessment:   1. Hallux limitus, left   2. Hallux limitus, right      Plan:  Patient was evaluated and treated and all questions answered. -Xrays reviewed no acute fractures or dislocations noted.  Bilateral severe degenerative changes noted to first MPJ.  Osteophytes cystic changes and loss of joint space noted. -Discussed hallux limitus and  treatement options; conservative and  Surgical management; risks, benefits, alternatives discussed. All  patient's questions answered. -Rx Meloxicam  provided. - Discussed injection in the future if needed.  Deferred today. -Recommend continue with good supportive shoes and inserts. Discussed stiff soled shoes and use of carbon fiber foot plate.   -Patient to return to office as needed or sooner if condition worsens.   Nathaniel Gill, DPM    "

## 2024-07-22 ENCOUNTER — Ambulatory Visit: Admitting: Podiatry

## 2024-07-26 ENCOUNTER — Other Ambulatory Visit: Payer: Self-pay

## 2024-07-26 DIAGNOSIS — E1169 Type 2 diabetes mellitus with other specified complication: Secondary | ICD-10-CM

## 2024-07-26 MED ORDER — EZETIMIBE 10 MG PO TABS: 10.0000 mg | ORAL_TABLET | Freq: Every day | ORAL | 0 refills | Status: AC

## 2024-07-29 ENCOUNTER — Ambulatory Visit: Admitting: Family Medicine

## 2024-08-06 ENCOUNTER — Other Ambulatory Visit: Payer: Self-pay

## 2024-08-08 ENCOUNTER — Other Ambulatory Visit: Payer: Self-pay

## 2024-08-08 MED ORDER — METFORMIN HCL ER 500 MG PO TB24: 1000.0000 mg | ORAL_TABLET | Freq: Two times a day (BID) | ORAL | 1 refills | Status: AC

## 2024-08-21 ENCOUNTER — Ambulatory Visit: Admitting: Cardiology
